# Patient Record
Sex: Female | Born: 1955
Health system: Southern US, Community
[De-identification: ages and names within clinical notes are randomized; demographics above are authoritative.]

## PROBLEM LIST (undated history)

## (undated) DIAGNOSIS — I10 Essential (primary) hypertension: Secondary | ICD-10-CM

## (undated) DIAGNOSIS — E7800 Pure hypercholesterolemia, unspecified: Secondary | ICD-10-CM

## (undated) DIAGNOSIS — Z9109 Other allergy status, other than to drugs and biological substances: Secondary | ICD-10-CM

## (undated) DIAGNOSIS — E119 Type 2 diabetes mellitus without complications: Secondary | ICD-10-CM

## (undated) DIAGNOSIS — R51 Headache: Secondary | ICD-10-CM

## (undated) DIAGNOSIS — E669 Obesity, unspecified: Secondary | ICD-10-CM

## (undated) DIAGNOSIS — M1990 Unspecified osteoarthritis, unspecified site: Secondary | ICD-10-CM

## (undated) DIAGNOSIS — H269 Unspecified cataract: Secondary | ICD-10-CM

## (undated) DIAGNOSIS — F419 Anxiety disorder, unspecified: Secondary | ICD-10-CM

## (undated) DIAGNOSIS — M5430 Sciatica, unspecified side: Secondary | ICD-10-CM

## (undated) DIAGNOSIS — R011 Cardiac murmur, unspecified: Secondary | ICD-10-CM

## (undated) DIAGNOSIS — J383 Other diseases of vocal cords: Secondary | ICD-10-CM

## (undated) DIAGNOSIS — K449 Diaphragmatic hernia without obstruction or gangrene: Secondary | ICD-10-CM

## (undated) DIAGNOSIS — R0600 Dyspnea, unspecified: Secondary | ICD-10-CM

## (undated) DIAGNOSIS — T7840XA Allergy, unspecified, initial encounter: Secondary | ICD-10-CM

## (undated) DIAGNOSIS — R209 Unspecified disturbances of skin sensation: Secondary | ICD-10-CM

## (undated) DIAGNOSIS — R0609 Other forms of dyspnea: Secondary | ICD-10-CM

## (undated) DIAGNOSIS — J42 Unspecified chronic bronchitis: Secondary | ICD-10-CM

## (undated) DIAGNOSIS — K3184 Gastroparesis: Secondary | ICD-10-CM

## (undated) DIAGNOSIS — J449 Chronic obstructive pulmonary disease, unspecified: Secondary | ICD-10-CM

## (undated) DIAGNOSIS — K219 Gastro-esophageal reflux disease without esophagitis: Secondary | ICD-10-CM

## (undated) DIAGNOSIS — G4730 Sleep apnea, unspecified: Secondary | ICD-10-CM

## (undated) DIAGNOSIS — Z5189 Encounter for other specified aftercare: Secondary | ICD-10-CM

## (undated) DIAGNOSIS — M75100 Unspecified rotator cuff tear or rupture of unspecified shoulder, not specified as traumatic: Secondary | ICD-10-CM

## (undated) HISTORY — DX: Gastroparesis: K31.84

## (undated) HISTORY — DX: Sleep apnea, unspecified: G47.30

## (undated) HISTORY — DX: Diaphragmatic hernia without obstruction or gangrene: K44.9

## (undated) HISTORY — DX: Sciatica, unspecified side: M54.30

## (undated) HISTORY — DX: Essential (primary) hypertension: I10

## (undated) HISTORY — PX: ABDOMINAL ADHESION SURGERY: SHX90

## (undated) HISTORY — DX: Unspecified cataract: H26.9

## (undated) HISTORY — DX: Encounter for other specified aftercare: Z51.89

## (undated) HISTORY — PX: CHOLECYSTECTOMY: SHX55

## (undated) HISTORY — DX: Obesity, unspecified: E66.9

## (undated) HISTORY — PX: TOE SURGERY: SHX1073

## (undated) HISTORY — DX: Allergy, unspecified, initial encounter: T78.40XA

## (undated) HISTORY — DX: Unspecified rotator cuff tear or rupture of unspecified shoulder, not specified as traumatic: M75.100

## (undated) HISTORY — PX: COLONOSCOPY: SHX174

---
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
## 1983-07-14 HISTORY — PX: TOTAL ABDOMINAL HYSTERECTOMY: SHX209

## 1990-07-13 HISTORY — PX: NASAL SINUS SURGERY: SHX719

## 2006-12-16 ENCOUNTER — Ambulatory Visit: Payer: Self-pay | Admitting: *Deleted

## 2006-12-16 ENCOUNTER — Ambulatory Visit: Payer: Self-pay | Admitting: Family Medicine

## 2006-12-30 ENCOUNTER — Ambulatory Visit: Payer: Self-pay | Admitting: Family Medicine

## 2007-03-03 ENCOUNTER — Ambulatory Visit: Payer: Self-pay | Admitting: Family Medicine

## 2007-03-03 LAB — CONVERTED CEMR LAB
Cholesterol: 206 mg/dL — ABNORMAL HIGH (ref 0–200)
HDL: 50 mg/dL (ref >39)
LDL Cholesterol: 127 mg/dL — ABNORMAL HIGH (ref 0–99)
Total CHOL/HDL Ratio: 4.1
Triglycerides: 147 mg/dL (ref <150)
VLDL: 29 mg/dL (ref 0–40)

## 2007-04-25 ENCOUNTER — Ambulatory Visit: Payer: Self-pay | Admitting: Family Medicine

## 2007-04-27 ENCOUNTER — Ambulatory Visit (HOSPITAL_COMMUNITY): Admission: RE | Admit: 2007-04-27 | Discharge: 2007-04-27 | Payer: Self-pay | Admitting: Family Medicine

## 2007-05-14 ENCOUNTER — Emergency Department (HOSPITAL_COMMUNITY): Admission: EM | Admit: 2007-05-14 | Discharge: 2007-05-14 | Payer: Self-pay | Admitting: Emergency Medicine

## 2007-05-26 DIAGNOSIS — J45909 Unspecified asthma, uncomplicated: Secondary | ICD-10-CM | POA: Insufficient documentation

## 2007-05-26 DIAGNOSIS — Z862 Personal history of diseases of the blood and blood-forming organs and certain disorders involving the immune mechanism: Secondary | ICD-10-CM | POA: Insufficient documentation

## 2007-05-26 DIAGNOSIS — F438 Other reactions to severe stress: Secondary | ICD-10-CM | POA: Insufficient documentation

## 2007-05-26 DIAGNOSIS — Z794 Long term (current) use of insulin: Secondary | ICD-10-CM

## 2007-05-26 DIAGNOSIS — K219 Gastro-esophageal reflux disease without esophagitis: Secondary | ICD-10-CM | POA: Insufficient documentation

## 2007-05-26 DIAGNOSIS — I10 Essential (primary) hypertension: Secondary | ICD-10-CM | POA: Insufficient documentation

## 2007-05-26 DIAGNOSIS — E119 Type 2 diabetes mellitus without complications: Secondary | ICD-10-CM | POA: Insufficient documentation

## 2007-05-26 DIAGNOSIS — F4389 Other reactions to severe stress: Secondary | ICD-10-CM | POA: Insufficient documentation

## 2007-05-30 ENCOUNTER — Ambulatory Visit: Payer: Self-pay | Admitting: Family Medicine

## 2007-06-27 ENCOUNTER — Ambulatory Visit: Payer: Self-pay | Admitting: Family Medicine

## 2007-12-15 ENCOUNTER — Encounter (INDEPENDENT_AMBULATORY_CARE_PROVIDER_SITE_OTHER): Payer: Self-pay | Admitting: Family Medicine

## 2007-12-15 ENCOUNTER — Ambulatory Visit: Payer: Self-pay | Admitting: Internal Medicine

## 2007-12-15 LAB — CONVERTED CEMR LAB
ALT: 13 units/L (ref 0–35)
AST: 13 units/L (ref 0–37)
Albumin: 4.3 g/dL (ref 3.5–5.2)
Alkaline Phosphatase: 63 units/L (ref 39–117)
BUN: 13 mg/dL (ref 6–23)
CO2: 27 meq/L (ref 19–32)
Calcium: 9.8 mg/dL (ref 8.4–10.5)
Chloride: 100 meq/L (ref 96–112)
Cholesterol: 225 mg/dL — ABNORMAL HIGH (ref 0–200)
Creatinine, Ser: 0.8 mg/dL (ref 0.40–1.20)
Glucose, Bld: 143 mg/dL — ABNORMAL HIGH (ref 70–99)
HDL: 42 mg/dL (ref >39)
LDL Cholesterol: 111 mg/dL — ABNORMAL HIGH (ref 0–99)
Microalb, Ur: 3.12 mg/dL — ABNORMAL HIGH (ref 0.00–1.89)
Potassium: 4.1 meq/L (ref 3.5–5.3)
Sodium: 139 meq/L (ref 135–145)
Total Bilirubin: 0.3 mg/dL (ref 0.3–1.2)
Total CHOL/HDL Ratio: 5.4
Total Protein: 7.5 g/dL (ref 6.0–8.3)
Triglycerides: 358 mg/dL — ABNORMAL HIGH (ref <150)
VLDL: 72 mg/dL — ABNORMAL HIGH (ref 0–40)
Vit D, 1,25-Dihydroxy: 14 — ABNORMAL LOW (ref 30–89)

## 2007-12-19 ENCOUNTER — Ambulatory Visit (HOSPITAL_COMMUNITY): Admission: RE | Admit: 2007-12-19 | Discharge: 2007-12-19 | Payer: Self-pay | Admitting: Family Medicine

## 2008-01-04 ENCOUNTER — Emergency Department (HOSPITAL_COMMUNITY): Admission: EM | Admit: 2008-01-04 | Discharge: 2008-01-04 | Payer: Self-pay | Admitting: Emergency Medicine

## 2008-02-11 ENCOUNTER — Ambulatory Visit (HOSPITAL_BASED_OUTPATIENT_CLINIC_OR_DEPARTMENT_OTHER): Admission: RE | Admit: 2008-02-11 | Discharge: 2008-02-11 | Payer: Self-pay | Admitting: Family Medicine

## 2008-02-14 ENCOUNTER — Ambulatory Visit: Payer: Self-pay | Admitting: Family Medicine

## 2008-02-14 LAB — CONVERTED CEMR LAB: Vit D, 1,25-Dihydroxy: 13 — ABNORMAL LOW (ref 30–89)

## 2008-02-18 ENCOUNTER — Ambulatory Visit: Payer: Self-pay | Admitting: Internal Medicine

## 2008-02-28 ENCOUNTER — Ambulatory Visit: Payer: Self-pay | Admitting: Internal Medicine

## 2008-03-21 ENCOUNTER — Ambulatory Visit: Payer: Self-pay | Admitting: Internal Medicine

## 2008-04-25 ENCOUNTER — Emergency Department (HOSPITAL_COMMUNITY): Admission: EM | Admit: 2008-04-25 | Discharge: 2008-04-25 | Payer: Self-pay | Admitting: Emergency Medicine

## 2008-05-10 ENCOUNTER — Ambulatory Visit: Payer: Self-pay | Admitting: Family Medicine

## 2008-09-04 ENCOUNTER — Ambulatory Visit: Payer: Self-pay | Admitting: Family Medicine

## 2008-09-12 ENCOUNTER — Ambulatory Visit (HOSPITAL_COMMUNITY): Admission: RE | Admit: 2008-09-12 | Discharge: 2008-09-12 | Payer: Self-pay | Admitting: Family Medicine

## 2008-09-27 ENCOUNTER — Encounter (INDEPENDENT_AMBULATORY_CARE_PROVIDER_SITE_OTHER): Payer: Self-pay | Admitting: Internal Medicine

## 2008-09-27 ENCOUNTER — Ambulatory Visit: Payer: Self-pay | Admitting: Cardiology

## 2008-09-27 ENCOUNTER — Inpatient Hospital Stay (HOSPITAL_COMMUNITY): Admission: EM | Admit: 2008-09-27 | Discharge: 2008-09-28 | Payer: Self-pay | Admitting: Emergency Medicine

## 2008-10-30 ENCOUNTER — Ambulatory Visit: Payer: Self-pay | Admitting: Family Medicine

## 2008-11-01 ENCOUNTER — Ambulatory Visit (HOSPITAL_COMMUNITY): Admission: RE | Admit: 2008-11-01 | Discharge: 2008-11-01 | Payer: Self-pay | Admitting: Family Medicine

## 2008-12-31 ENCOUNTER — Emergency Department (HOSPITAL_COMMUNITY): Admission: EM | Admit: 2008-12-31 | Discharge: 2008-12-31 | Payer: Self-pay | Admitting: Emergency Medicine

## 2008-12-31 ENCOUNTER — Ambulatory Visit: Payer: Self-pay | Admitting: Family Medicine

## 2009-02-14 ENCOUNTER — Ambulatory Visit: Payer: Self-pay | Admitting: Internal Medicine

## 2009-03-01 ENCOUNTER — Ambulatory Visit: Payer: Self-pay | Admitting: Family Medicine

## 2009-03-01 LAB — CONVERTED CEMR LAB
ALT: 18 units/L (ref 0–35)
AST: 14 units/L (ref 0–37)
Albumin: 4.2 g/dL (ref 3.5–5.2)
Alkaline Phosphatase: 58 units/L (ref 39–117)
BUN: 15 mg/dL (ref 6–23)
Basophils Absolute: 0 10*3/uL (ref 0.0–0.1)
Basophils Relative: 1 % (ref 0–1)
CO2: 26 meq/L (ref 19–32)
CRP: 1 mg/dL — ABNORMAL HIGH (ref <0.6)
Calcium: 9.3 mg/dL (ref 8.4–10.5)
Chloride: 102 meq/L (ref 96–112)
Creatinine, Ser: 0.73 mg/dL (ref 0.40–1.20)
Eosinophils Absolute: 0.2 10*3/uL (ref 0.0–0.7)
Eosinophils Relative: 4 % (ref 0–5)
Glucose, Bld: 164 mg/dL — ABNORMAL HIGH (ref 70–99)
HCT: 41.7 % (ref 36.0–46.0)
Hemoglobin: 13 g/dL (ref 12.0–15.0)
Lymphocytes Relative: 30 % (ref 12–46)
Lymphs Abs: 1.6 10*3/uL (ref 0.7–4.0)
MCHC: 31.2 g/dL (ref 30.0–36.0)
MCV: 86.2 fL (ref 78.0–100.0)
Monocytes Absolute: 0.4 10*3/uL (ref 0.1–1.0)
Monocytes Relative: 8 % (ref 3–12)
Neutro Abs: 3.2 10*3/uL (ref 1.7–7.7)
Neutrophils Relative %: 59 % (ref 43–77)
Platelets: 269 10*3/uL (ref 150–400)
Potassium: 4.3 meq/L (ref 3.5–5.3)
RBC: 4.84 M/uL (ref 3.87–5.11)
RDW: 16.9 % — ABNORMAL HIGH (ref 11.5–15.5)
Sodium: 140 meq/L (ref 135–145)
Total Bilirubin: 0.3 mg/dL (ref 0.3–1.2)
Total Protein: 7.3 g/dL (ref 6.0–8.3)
Vit D, 25-Hydroxy: 33 ng/mL (ref 30–89)
WBC: 5.6 10*3/uL (ref 4.0–10.5)

## 2009-04-16 ENCOUNTER — Ambulatory Visit: Payer: Self-pay | Admitting: Internal Medicine

## 2009-05-01 ENCOUNTER — Ambulatory Visit: Payer: Self-pay | Admitting: Family Medicine

## 2009-05-07 ENCOUNTER — Telehealth (INDEPENDENT_AMBULATORY_CARE_PROVIDER_SITE_OTHER): Payer: Self-pay | Admitting: *Deleted

## 2009-05-08 ENCOUNTER — Encounter (INDEPENDENT_AMBULATORY_CARE_PROVIDER_SITE_OTHER): Payer: Self-pay | Admitting: Family Medicine

## 2009-05-08 ENCOUNTER — Ambulatory Visit: Payer: Self-pay | Admitting: Adult Health

## 2009-05-08 LAB — CONVERTED CEMR LAB
ALT: 17 units/L (ref 0–35)
AST: 14 units/L (ref 0–37)
Albumin: 4.1 g/dL (ref 3.5–5.2)
Alkaline Phosphatase: 56 units/L (ref 39–117)
BUN: 13 mg/dL (ref 6–23)
CO2: 23 meq/L (ref 19–32)
Calcium: 9.3 mg/dL (ref 8.4–10.5)
Chloride: 103 meq/L (ref 96–112)
Cholesterol: 228 mg/dL — ABNORMAL HIGH (ref 0–200)
Creatinine, Ser: 0.78 mg/dL (ref 0.40–1.20)
Glucose, Bld: 127 mg/dL — ABNORMAL HIGH (ref 70–99)
HDL: 49 mg/dL (ref >39)
LDL Cholesterol: 154 mg/dL — ABNORMAL HIGH (ref 0–99)
Potassium: 4.6 meq/L (ref 3.5–5.3)
Sodium: 141 meq/L (ref 135–145)
Total Bilirubin: 0.3 mg/dL (ref 0.3–1.2)
Total CHOL/HDL Ratio: 4.7
Total Protein: 7.3 g/dL (ref 6.0–8.3)
Triglycerides: 125 mg/dL (ref <150)
VLDL: 25 mg/dL (ref 0–40)

## 2009-05-09 ENCOUNTER — Ambulatory Visit: Payer: Self-pay | Admitting: Internal Medicine

## 2009-05-17 ENCOUNTER — Emergency Department (HOSPITAL_COMMUNITY): Admission: EM | Admit: 2009-05-17 | Discharge: 2009-05-17 | Payer: Self-pay | Admitting: Emergency Medicine

## 2009-06-04 ENCOUNTER — Telehealth (INDEPENDENT_AMBULATORY_CARE_PROVIDER_SITE_OTHER): Payer: Self-pay | Admitting: *Deleted

## 2009-06-04 ENCOUNTER — Ambulatory Visit: Payer: Self-pay | Admitting: Internal Medicine

## 2009-06-04 ENCOUNTER — Ambulatory Visit: Admission: RE | Admit: 2009-06-04 | Discharge: 2009-06-04 | Payer: Self-pay | Admitting: Internal Medicine

## 2009-06-04 ENCOUNTER — Ambulatory Visit: Payer: Self-pay | Admitting: Vascular Surgery

## 2009-06-04 ENCOUNTER — Encounter: Payer: Self-pay | Admitting: Internal Medicine

## 2009-10-01 ENCOUNTER — Emergency Department (HOSPITAL_COMMUNITY): Admission: EM | Admit: 2009-10-01 | Discharge: 2009-10-02 | Payer: Self-pay | Admitting: Emergency Medicine

## 2009-10-24 ENCOUNTER — Ambulatory Visit: Payer: Self-pay | Admitting: Family Medicine

## 2009-11-18 ENCOUNTER — Ambulatory Visit: Payer: Self-pay | Admitting: Internal Medicine

## 2009-12-24 ENCOUNTER — Ambulatory Visit: Payer: Self-pay | Admitting: Family Medicine

## 2010-01-16 ENCOUNTER — Ambulatory Visit: Payer: Self-pay | Admitting: Internal Medicine

## 2010-03-25 ENCOUNTER — Ambulatory Visit: Payer: Self-pay | Admitting: Family Medicine

## 2010-03-25 LAB — CONVERTED CEMR LAB
BUN: 13 mg/dL (ref 6–23)
CO2: 27 meq/L (ref 19–32)
Calcium: 9.5 mg/dL (ref 8.4–10.5)
Chloride: 97 meq/L (ref 96–112)
Cholesterol: 189 mg/dL (ref 0–200)
Creatinine, Ser: 0.83 mg/dL (ref 0.40–1.20)
Glucose, Bld: 249 mg/dL — ABNORMAL HIGH (ref 70–99)
HDL: 50 mg/dL (ref >39)
Hgb A1c MFr Bld: 10.3 % — ABNORMAL HIGH (ref <5.7)
LDL Cholesterol: 111 mg/dL — ABNORMAL HIGH (ref 0–99)
Potassium: 4.3 meq/L (ref 3.5–5.3)
Sodium: 136 meq/L (ref 135–145)
Total CHOL/HDL Ratio: 3.8
Triglycerides: 138 mg/dL (ref <150)
VLDL: 28 mg/dL (ref 0–40)

## 2010-04-19 ENCOUNTER — Emergency Department (HOSPITAL_COMMUNITY): Admission: EM | Admit: 2010-04-19 | Discharge: 2010-04-19 | Payer: Self-pay | Admitting: Emergency Medicine

## 2010-08-13 NOTE — Progress Notes (Signed)
Summary: triage/leg swollen painful  Phone Note Call from Patient   Caller: Patient Reason for Call: Talk to Nurse Summary of Call: Patient states she is having right leg pain..states she has a hx of sciatica but it doesn't feel like that...behind her knee is swollen...can't see if it is red and doesn't feel hot...patient denies hx of blood clots however states she has circulation problems....she has a history of DM .Marland Kitchenput in Shaheerah's schedule... Initial call taken by: Verne Spurr,  June 04, 2009 11:07 AM

## 2010-08-13 NOTE — Progress Notes (Signed)
Summary: triage/congestion  Phone Note Call from Patient   Caller: Patient Reason for Call: Talk to Nurse Summary of Call: patient was seen 04/16/09 for asthma/chronic brondhitis....and feels she has gotten worse with sinus pressure and drainage. Patient states she has been having ear pain.Marland KitchenMarland KitchenShe has a history of DM, Asthma and chronic bronchitis.Marland KitchenMarland KitchenMarland KitchenPatient has cough and sounds congested... Initial call taken by: Verne Spurr,  May 07, 2009 11:49 AM

## 2010-08-26 ENCOUNTER — Ambulatory Visit (HOSPITAL_COMMUNITY)
Admission: RE | Admit: 2010-08-26 | Discharge: 2010-08-26 | Disposition: A | Discharge status: Home / Self Care | Payer: Self-pay | Source: Ambulatory Visit | Attending: Family Medicine | Admitting: Family Medicine

## 2010-08-26 ENCOUNTER — Other Ambulatory Visit: Payer: Self-pay | Admitting: Family Medicine

## 2010-08-26 DIAGNOSIS — J42 Unspecified chronic bronchitis: Secondary | ICD-10-CM | POA: Insufficient documentation

## 2010-08-26 DIAGNOSIS — R0602 Shortness of breath: Secondary | ICD-10-CM | POA: Insufficient documentation

## 2010-09-19 ENCOUNTER — Emergency Department (HOSPITAL_COMMUNITY): Payer: Self-pay

## 2010-09-19 ENCOUNTER — Emergency Department (HOSPITAL_COMMUNITY)
Admission: EM | Admit: 2010-09-19 | Discharge: 2010-09-19 | Disposition: A | Discharge status: Home / Self Care | Payer: Self-pay | Attending: Emergency Medicine | Admitting: Emergency Medicine

## 2010-09-19 DIAGNOSIS — R05 Cough: Secondary | ICD-10-CM | POA: Insufficient documentation

## 2010-09-19 DIAGNOSIS — R079 Chest pain, unspecified: Secondary | ICD-10-CM | POA: Insufficient documentation

## 2010-09-19 DIAGNOSIS — Z79899 Other long term (current) drug therapy: Secondary | ICD-10-CM | POA: Insufficient documentation

## 2010-09-19 DIAGNOSIS — Z7982 Long term (current) use of aspirin: Secondary | ICD-10-CM | POA: Insufficient documentation

## 2010-09-19 DIAGNOSIS — I10 Essential (primary) hypertension: Secondary | ICD-10-CM | POA: Insufficient documentation

## 2010-09-19 DIAGNOSIS — J45909 Unspecified asthma, uncomplicated: Secondary | ICD-10-CM | POA: Insufficient documentation

## 2010-09-19 DIAGNOSIS — E119 Type 2 diabetes mellitus without complications: Secondary | ICD-10-CM | POA: Insufficient documentation

## 2010-09-19 DIAGNOSIS — R0602 Shortness of breath: Secondary | ICD-10-CM | POA: Insufficient documentation

## 2010-09-19 DIAGNOSIS — R059 Cough, unspecified: Secondary | ICD-10-CM | POA: Insufficient documentation

## 2010-09-25 LAB — CBC
HCT: 41.6 % (ref 36.0–46.0)
Hemoglobin: 13.6 g/dL (ref 12.0–15.0)
MCH: 27.3 pg (ref 26.0–34.0)
MCHC: 32.7 g/dL (ref 30.0–36.0)
MCV: 83.4 fL (ref 78.0–100.0)
Platelets: 212 10*3/uL (ref 150–400)
RBC: 4.99 MIL/uL (ref 3.87–5.11)
RDW: 14.1 % (ref 11.5–15.5)
WBC: 7.4 10*3/uL (ref 4.0–10.5)

## 2010-09-25 LAB — DIFFERENTIAL
Basophils Absolute: 0 10*3/uL (ref 0.0–0.1)
Basophils Relative: 0 % (ref 0–1)
Eosinophils Absolute: 0.3 10*3/uL (ref 0.0–0.7)
Eosinophils Relative: 4 % (ref 0–5)
Lymphocytes Relative: 35 % (ref 12–46)
Lymphs Abs: 2.6 10*3/uL (ref 0.7–4.0)
Monocytes Absolute: 0.5 10*3/uL (ref 0.1–1.0)
Monocytes Relative: 7 % (ref 3–12)
Neutro Abs: 4 10*3/uL (ref 1.7–7.7)
Neutrophils Relative %: 54 % (ref 43–77)

## 2010-09-25 LAB — BASIC METABOLIC PANEL
BUN: 8 mg/dL (ref 6–23)
CO2: 26 mEq/L (ref 19–32)
Calcium: 9.2 mg/dL (ref 8.4–10.5)
Chloride: 104 mEq/L (ref 96–112)
Creatinine, Ser: 0.76 mg/dL (ref 0.4–1.2)
GFR calc Af Amer: >60 mL/min (ref >60)
GFR calc non Af Amer: >60 mL/min (ref >60)
Glucose, Bld: 208 mg/dL — ABNORMAL HIGH (ref 70–99)
Potassium: 3.4 mEq/L — ABNORMAL LOW (ref 3.5–5.1)
Sodium: 137 mEq/L (ref 135–145)

## 2010-10-05 LAB — RAPID URINE DRUG SCREEN, HOSP PERFORMED
Amphetamines: NOT DETECTED
Barbiturates: NOT DETECTED
Benzodiazepines: NOT DETECTED
Cocaine: NOT DETECTED
Opiates: NOT DETECTED
Tetrahydrocannabinol: NOT DETECTED

## 2010-10-05 LAB — DIFFERENTIAL
Basophils Absolute: 0 10*3/uL (ref 0.0–0.1)
Basophils Relative: 1 % (ref 0–1)
Eosinophils Absolute: 0.2 10*3/uL (ref 0.0–0.7)
Eosinophils Relative: 4 % (ref 0–5)
Lymphocytes Relative: 34 % (ref 12–46)
Lymphs Abs: 2.2 10*3/uL (ref 0.7–4.0)
Monocytes Absolute: 0.4 10*3/uL (ref 0.1–1.0)
Monocytes Relative: 6 % (ref 3–12)
Neutro Abs: 3.6 10*3/uL (ref 1.7–7.7)
Neutrophils Relative %: 56 % (ref 43–77)

## 2010-10-05 LAB — CBC
HCT: 40.8 % (ref 36.0–46.0)
Hemoglobin: 13.4 g/dL (ref 12.0–15.0)
MCHC: 32.8 g/dL (ref 30.0–36.0)
MCV: 83.9 fL (ref 78.0–100.0)
Platelets: 227 10*3/uL (ref 150–400)
RBC: 4.86 MIL/uL (ref 3.87–5.11)
RDW: 15.1 % (ref 11.5–15.5)
WBC: 6.4 10*3/uL (ref 4.0–10.5)

## 2010-10-05 LAB — URINE MICROSCOPIC-ADD ON

## 2010-10-05 LAB — URINALYSIS, ROUTINE W REFLEX MICROSCOPIC
Bilirubin Urine: NEGATIVE
Glucose, UA: NEGATIVE mg/dL
Hgb urine dipstick: NEGATIVE
Ketones, ur: 15 mg/dL — AB
Leukocytes, UA: NEGATIVE
Nitrite: NEGATIVE
Protein, ur: 100 mg/dL — AB
Specific Gravity, Urine: 1.03 (ref 1.005–1.030)
Urobilinogen, UA: 1 mg/dL (ref 0.0–1.0)
pH: 5.5 (ref 5.0–8.0)

## 2010-10-05 LAB — COMPREHENSIVE METABOLIC PANEL
ALT: 27 U/L (ref 0–35)
AST: 26 U/L (ref 0–37)
Albumin: 3.9 g/dL (ref 3.5–5.2)
Alkaline Phosphatase: 74 U/L (ref 39–117)
BUN: 8 mg/dL (ref 6–23)
CO2: 28 mEq/L (ref 19–32)
Calcium: 10.1 mg/dL (ref 8.4–10.5)
Chloride: 105 mEq/L (ref 96–112)
Creatinine, Ser: 0.75 mg/dL (ref 0.4–1.2)
GFR calc Af Amer: >60 mL/min (ref >60)
GFR calc non Af Amer: >60 mL/min (ref >60)
Glucose, Bld: 130 mg/dL — ABNORMAL HIGH (ref 70–99)
Potassium: 4.1 mEq/L (ref 3.5–5.1)
Sodium: 140 mEq/L (ref 135–145)
Total Bilirubin: 0.4 mg/dL (ref 0.3–1.2)
Total Protein: 7.4 g/dL (ref 6.0–8.3)

## 2010-10-05 LAB — ETHANOL: Alcohol, Ethyl (B): <5 mg/dL (ref 0–10)

## 2010-10-05 LAB — D-DIMER, QUANTITATIVE: D-Dimer, Quant: 0.26 ug/mL-FEU (ref 0.00–0.48)

## 2010-10-15 LAB — POCT I-STAT, CHEM 8
BUN: 17 mg/dL (ref 6–23)
Calcium, Ion: 1.16 mmol/L (ref 1.12–1.32)
Chloride: 102 mEq/L (ref 96–112)
Creatinine, Ser: 0.6 mg/dL (ref 0.4–1.2)
Glucose, Bld: 147 mg/dL — ABNORMAL HIGH (ref 70–99)
HCT: 41 % (ref 36.0–46.0)
Hemoglobin: 13.9 g/dL (ref 12.0–15.0)
Potassium: 3.9 mEq/L (ref 3.5–5.1)
Sodium: 140 mEq/L (ref 135–145)
TCO2: 28 mmol/L (ref 0–100)

## 2010-10-15 LAB — CBC
HCT: 37.6 % (ref 36.0–46.0)
Hemoglobin: 12.6 g/dL (ref 12.0–15.0)
MCHC: 33.6 g/dL (ref 30.0–36.0)
MCV: 84.2 fL (ref 78.0–100.0)
Platelets: 227 10*3/uL (ref 150–400)
RBC: 4.46 MIL/uL (ref 3.87–5.11)
RDW: 14.9 % (ref 11.5–15.5)
WBC: 6.4 10*3/uL (ref 4.0–10.5)

## 2010-10-15 LAB — DIFFERENTIAL
Basophils Absolute: 0 10*3/uL (ref 0.0–0.1)
Basophils Relative: 1 % (ref 0–1)
Eosinophils Absolute: 0.2 10*3/uL (ref 0.0–0.7)
Eosinophils Relative: 4 % (ref 0–5)
Lymphocytes Relative: 29 % (ref 12–46)
Lymphs Abs: 1.9 10*3/uL (ref 0.7–4.0)
Monocytes Absolute: 0.3 10*3/uL (ref 0.1–1.0)
Monocytes Relative: 5 % (ref 3–12)
Neutro Abs: 3.9 10*3/uL (ref 1.7–7.7)
Neutrophils Relative %: 61 % (ref 43–77)

## 2010-10-15 LAB — POCT CARDIAC MARKERS
CKMB, poc: 1.3 ng/mL (ref 1.0–8.0)
Myoglobin, poc: 62.9 ng/mL (ref 12–200)
Troponin i, poc: <0.05 ng/mL (ref 0.00–0.09)

## 2010-10-23 LAB — COMPREHENSIVE METABOLIC PANEL
ALT: 20 U/L (ref 0–35)
AST: 13 U/L (ref 0–37)
Albumin: 3.9 g/dL (ref 3.5–5.2)
Alkaline Phosphatase: 60 U/L (ref 39–117)
BUN: 8 mg/dL (ref 6–23)
CO2: 30 mEq/L (ref 19–32)
Calcium: 9.2 mg/dL (ref 8.4–10.5)
Chloride: 100 mEq/L (ref 96–112)
Creatinine, Ser: 0.7 mg/dL (ref 0.4–1.2)
GFR calc Af Amer: >60 mL/min (ref >60)
GFR calc non Af Amer: >60 mL/min (ref >60)
Glucose, Bld: 181 mg/dL — ABNORMAL HIGH (ref 70–99)
Potassium: 3.5 mEq/L (ref 3.5–5.1)
Sodium: 138 mEq/L (ref 135–145)
Total Bilirubin: 0.7 mg/dL (ref 0.3–1.2)
Total Protein: 7.2 g/dL (ref 6.0–8.3)

## 2010-10-23 LAB — LIPID PANEL
Cholesterol: 251 mg/dL — ABNORMAL HIGH (ref 0–200)
Cholesterol: 286 mg/dL — ABNORMAL HIGH (ref 0–200)
HDL: 32 mg/dL — ABNORMAL LOW (ref >39)
HDL: 37 mg/dL — ABNORMAL LOW (ref >39)
LDL Cholesterol: 177 mg/dL — ABNORMAL HIGH (ref 0–99)
LDL Cholesterol: 210 mg/dL — ABNORMAL HIGH (ref 0–99)
Total CHOL/HDL Ratio: 7.7 RATIO
Total CHOL/HDL Ratio: 7.8 RATIO
Triglycerides: 196 mg/dL — ABNORMAL HIGH (ref <150)
Triglycerides: 209 mg/dL — ABNORMAL HIGH (ref <150)
VLDL: 39 mg/dL (ref 0–40)
VLDL: 42 mg/dL — ABNORMAL HIGH (ref 0–40)

## 2010-10-23 LAB — GLUCOSE, CAPILLARY
Glucose-Capillary: 181 mg/dL — ABNORMAL HIGH (ref 70–99)
Glucose-Capillary: 197 mg/dL — ABNORMAL HIGH (ref 70–99)
Glucose-Capillary: 213 mg/dL — ABNORMAL HIGH (ref 70–99)
Glucose-Capillary: 214 mg/dL — ABNORMAL HIGH (ref 70–99)
Glucose-Capillary: 258 mg/dL — ABNORMAL HIGH (ref 70–99)
Glucose-Capillary: 292 mg/dL — ABNORMAL HIGH (ref 70–99)

## 2010-10-23 LAB — CK TOTAL AND CKMB (NOT AT ARMC)
CK, MB: 1.2 ng/mL (ref 0.3–4.0)
Relative Index: INVALID (ref 0.0–2.5)
Total CK: 95 U/L (ref 7–177)

## 2010-10-23 LAB — CARDIAC PANEL(CRET KIN+CKTOT+MB+TROPI)
CK, MB: 1.3 ng/mL (ref 0.3–4.0)
CK, MB: 1.4 ng/mL (ref 0.3–4.0)
Relative Index: 1.2 (ref 0.0–2.5)
Relative Index: 1.2 (ref 0.0–2.5)
Total CK: 113 U/L (ref 7–177)
Total CK: 120 U/L (ref 7–177)
Troponin I: <0.01 ng/mL (ref 0.00–0.06)
Troponin I: <0.01 ng/mL (ref 0.00–0.06)

## 2010-10-23 LAB — D-DIMER, QUANTITATIVE: D-Dimer, Quant: 0.39 ug/mL-FEU (ref 0.00–0.48)

## 2010-10-23 LAB — CBC
HCT: 40.2 % (ref 36.0–46.0)
Hemoglobin: 13.1 g/dL (ref 12.0–15.0)
MCHC: 32.7 g/dL (ref 30.0–36.0)
MCV: 83.6 fL (ref 78.0–100.0)
Platelets: 242 10*3/uL (ref 150–400)
RBC: 4.81 MIL/uL (ref 3.87–5.11)
RDW: 15.6 % — ABNORMAL HIGH (ref 11.5–15.5)
WBC: 6.3 10*3/uL (ref 4.0–10.5)

## 2010-10-23 LAB — HEMOGLOBIN A1C
Hgb A1c MFr Bld: 7.2 % — ABNORMAL HIGH (ref 4.6–6.1)
Hgb A1c MFr Bld: 7.3 % — ABNORMAL HIGH (ref 4.6–6.1)
Mean Plasma Glucose: 160 mg/dL
Mean Plasma Glucose: 163 mg/dL

## 2010-10-23 LAB — DIFFERENTIAL
Basophils Absolute: 0.1 10*3/uL (ref 0.0–0.1)
Basophils Relative: 1 % (ref 0–1)
Eosinophils Absolute: 0.2 10*3/uL (ref 0.0–0.7)
Eosinophils Relative: 4 % (ref 0–5)
Lymphocytes Relative: 36 % (ref 12–46)
Lymphs Abs: 2.2 10*3/uL (ref 0.7–4.0)
Monocytes Absolute: 0.5 10*3/uL (ref 0.1–1.0)
Monocytes Relative: 8 % (ref 3–12)
Neutro Abs: 3.3 10*3/uL (ref 1.7–7.7)
Neutrophils Relative %: 52 % (ref 43–77)

## 2010-10-23 LAB — HOMOCYSTEINE
Homocysteine: 8.3 umol/L (ref 4.0–15.4)
Homocysteine: 9.3 umol/L (ref 4.0–15.4)

## 2010-10-23 LAB — POCT CARDIAC MARKERS
CKMB, poc: 1.4 ng/mL (ref 1.0–8.0)
Myoglobin, poc: 62.8 ng/mL (ref 12–200)
Troponin i, poc: <0.05 ng/mL (ref 0.00–0.09)

## 2010-10-23 LAB — TSH
TSH: 2.782 u[IU]/mL (ref 0.350–4.500)
TSH: 3.476 u[IU]/mL (ref 0.350–4.500)

## 2010-10-23 LAB — TROPONIN I: Troponin I: <0.01 ng/mL (ref 0.00–0.06)

## 2010-10-23 LAB — BRAIN NATRIURETIC PEPTIDE: Pro B Natriuretic peptide (BNP): <30 pg/mL (ref 0.0–100.0)

## 2010-11-25 NOTE — H&P (Signed)
[name], ORTLIP NO.:  [id number]   MEDICAL RECORD NO.:  [id number]          PATIENT TYPE:  INP   LOCATION:  2928                         FACILITY:  [facility]   PHYSICIAN:  Karlyn Agee, M.D. DATE OF BIRTH:  05-Sep-1955   DATE OF ADMISSION:  09/26/2008  DATE OF DISCHARGE:                              HISTORY & PHYSICAL   PRIMARY CARE PHYSICIAN:  [facility].   CHIEF COMPLAINT:  Progressively worsening chest pain.   HISTORY OF PRESENT ILLNESS:  This is a 55 year old morbidly obese  African American lady with a history of obstructive sleep apnea,  hypertension and diabetes who has been having intermittent chest pains  for the past 3 months.  The pain is aggravated by exertion, but present  even at rest associated with nausea, and radiating down into her left  arm.  The patient has been evaluated by [facility] and been diagnosed  with obstructive sleep apnea.  Since she has been using a CPAP machine  at bedtime she has noted that there is improvement in the chest pain  while she is wearing the CPAP.  However, during the day she is troubled  by chest pain.  Since it is getting progressively worse she eventually  sought help in the emergency room since she can only be seen at  [facility] on a three monthly basis.  The patient has a mother who is  status post CABG.  The patient is also obese and has hyperlipidemia.  She does not use tobacco.  She is fairly sedentary.   PAST MEDICAL HISTORY:  Hypertension, diabetes, obstructive sleep apnea,  morbid obesity, bronchitis.   MEDICATIONS:  1. Lisinopril 10 mg daily.  2. Lipitor 10 mg daily.  3. Fish Oil 3 one tablet 3 times daily.  4. Vitamin D 2000 units daily.  5. Levemir 42 units twice daily.  6. Advair Diskus twice daily.  7. Avandamet.  8. Has run out of these medications.  Protonix 40 mg daily.  9. Albuterol p.r.n.  10.Aspirin 162 mg daily.   ALLERGIES:  No known drug allergies.   SOCIAL HISTORY:   She denies tobacco, alcohol or illicit drug use.  She  is an unemployed [employer].   FAMILY HISTORY:  Significant for coronary artery disease as noted above.  Also hypertension, diabetes and congestive heart failure.   REVIEW OF SYSTEMS:  Other than noted above significant for episodic  headaches, chronic cough related to bronchitis, episodic constipation  and ankle pain.  The patient does have paroxysmal nocturnal dyspnea,  dyspnea on exertion and lower extremity edema worse during the day.   PHYSICAL EXAMINATION:  GENERAL:  A morbidly obese African American lady  sitting up on the stretcher, in no acute distress at this time.  VITAL SIGNS:  Temperature 99.2, pulse 73, respirations 16, blood  pressure 134/82.  She is saturating at 99% on 2 liters.  HEENT:  Her pupils are round and equal.  Mucous membranes pink.  Anicteric.  No cervical lymphadenopathy or thyromegaly.  No jugular  venous distention but she does have a very thick neck.  CHEST:  Heart sounds are difficult to hear but no murmurs are heard.  ABDOMEN:  Obese, soft, nontender.  EXTREMITIES:  Without edema.  She has 2+ bounding dorsalis pedis pulses  bilaterally.  CENTRAL NERVOUS SYSTEM:  Cranial nerves II-XII are grossly intact.  She  has no focal lateralizing signs.   LABS:  Her CBC is reviewed and is unremarkable.  Her serum chemistry is  reviewed and is remarkable only for a potassium of 3.5 and a serum  glucose of 181.  Otherwise everything is within normal limits.  Her  cardiac enzymes are completely normal with an undetectable troponin.  CK-  MB of 1.4 and myoglobin of 62.8.  Her beta-natriuretic peptide is  undetectable.  Chest x-ray is reported as showing mild cardiac  enlargement and vascular congestion, bibasilar atelectasis, no edema,  infiltrates or effusions.  EKG shows sinus rhythm with right atrial  enlargement and a prolonged QT interval.  There are no ST segment  changes otherwise.   ASSESSMENT:   1. Atypical chest pain.  2. Morbid obesity.  3. Hypertension controlled.  4. Diabetes type 2.  5. History of bronchitis.   PLAN:  1. Will admit this lady for cardiac workup on the basis of family      history and symptoms although in all likelihood symptoms are      related to obstructive sleep apnea and possible subsequent      pulmonary hypertension.  2. Will get 2-D echo.  3. Will also check a D-dimer and if elevated may be candidate for CT      angiogram of the chest to rule out pulmonary emboli.  4. Will counsel on diet and the importance of weight loss.  5. The patient because of her lack of insurance may be counseled to      transition to NPH insulin for blood glucose control rather than      Levemir.  Will have her on Lantus while she is in the hospital.  6. Other plans as per orders.      Karlyn Agee, M.D.  Electronically Signed     LC/MEDQ  D:  09/27/2008  T:  09/27/2008  Job:  [id number]   cc:   Myriam Jacobson

## 2010-11-25 NOTE — Consult Note (Signed)
[name], [name]                ACCOUNT NO.:  [id number]   MEDICAL RECORD NO.:  [id number]          PATIENT TYPE:  INP   LOCATION:  4738                         FACILITY:  [facility]   PHYSICIAN:  Madolyn Frieze. Jens Som, MD, FACCDATE OF BIRTH:  May 14, 1956   DATE OF CONSULTATION:  09/27/2008  DATE OF DISCHARGE:                                 CONSULTATION   Ms. [name] is a 55 year old female with past medical history of diabetes,  hypertension, hyperlipidemia, COPD/asthma, and obstructive sleep apnea,  who I am asked to evaluate for chest pain.  Note, the patient has  chronic dyspnea on exertion, but there is no orthopnea or PND.  She does  use CPAP for obstructive sleep apnea.  She also occasionally has mild  pedal edema.  She does not have exertional chest pain.  Over the past 3  months, she has noticed increased congestion.  She has also had a  nonproductive cough.  There has been no fevers or chills or hemoptysis.  During that time, she feels pain across her chest.  The pain does not  radiate.  She states that it always occurs when she is congested.  She  has also had a needle stick sensation in the left chest area.  This is  not exertional nor is it pleuritic or positional.  It is not related to  food.  There is no associated nausea, vomiting, shortness of breath, or  diaphoresis.  It last for seconds and resolved spontaneously.  Because  of the above, she was admitted for bronchitis and also for chest pain.  She is ruled out myocardial infarction, and Cardiology is asked to  further evaluate.   PAST MEDICAL HISTORY:  Significant for diabetes mellitus for 7 years.  She also has hypertension and hyperlipidemia.  She has a long history of  COPD/asthma/obstructive sleep apnea and she does use CPAP.  She has a  history of gastroesophageal reflux disease and peptic ulcer disease by  her report.  She has had a prior sinus surgery as well as a partial  hysterectomy followed by a total  hysterectomy.  There is no other past  medical history noted.   ALLERGIES:  She has no known drug allergies.   SOCIAL HISTORY:  She has remote history of tobacco use, but has not  smoked in 15-20 years.  She has remote history of alcohol use, but has  not drunk in 15 years.  She is married, with 2 children.  She is  unemployed.   FAMILY HISTORY:  Significant for her mother who had coronary bypassing  graft, but it was at age 11.   REVIEW OF SYSTEMS:  She denies any headaches or fevers or chills.  She  has had a nonproductive cough.  There is no hemoptysis.  There is no  dysphagia or odynophagia nor is there any melena.  She does occasionally  have hematochezia, but attributes this to her hemorrhoids.  There is no  dysuria or hematuria.  There is no rash or seizure activity.  There is  no orthopnea or PND, but there is pedal edema.  Remaining systems are  negative.   PHYSICAL EXAMINATION:  VITAL SIGNS:  Blood pressure of 128/77 and pulse  of 71.  She is afebrile at 98.1.  She is 100% on 2 L.  GENERAL:  She is well developed and morbidly obese.  She is no acute  distress at present.  SKIN:  Warm and dry.  She does not appear to be depressed.  There is no  peripheral clubbing.  BACK:  Normal.  HEENT:  Normal with normal eyelids.  NECK:  Supple with a normal upstroke bilaterally.  No bruits noted.  There is no jugular distention.  I cannot appreciate thyromegaly.  CHEST:  Mild expiratory wheeze.  There is no rhonchi noted.  CARDIOVASCULAR:  Regular rate and rhythm.  Normal S1 and S2.  There is a  1/6 systolic ejection murmur at the left sternal border.  There is no  diastolic murmur noted.  ABDOMEN:  Nontender and nondistended.  Positive bowel sounds.  No  hepatosplenomegaly.  No mass appreciated.  There is no abdominal bruit.  She has 2+ femoral pulses bilaterally.  No bruits.  EXTREMITIES:  Trace edema bilaterally.  There are no cords palpated.  She has 2+ dorsalis pedis pulses  bilaterally.  NEUROLOGIC:  Grossly intact.   Her electrocardiogram shows a sinus rhythm at a rate of 69.  There is  right axis deviation.  There are nonspecific ST changes.  Her enzymes  are negative x3.  Her D-dimer is normal.  A BNP was normal at less than  30.  Her sodium is 138 with potassium of 3.5.  BUN and creatinine are 8  and 0.70.  Her white blood cell count of 6.3 with hemoglobin of 13.1,  hematocrit of 40.2, and her platelet count is 242.   DIAGNOSES:  1. Chest pain - symptoms.  The patient's symptoms are extremely      atypical.  However, she does have diabetes, hypertension,      hyperlipidemia, and prior tobacco abuse.  We will risk stratify      with a dobutamine Myoview.  If it is normal, we will not pursue      further cardiac workup.  2. Chronic obstructive pulmonary disease/asthma/obstructive sleep      apnea - this appears to be the predominant problem.  I would      recommend discontinuing the patient's Lopressor as it may      exacerbate her asthma.  She will continue on her CPAP.  She needs a      pulmonary toilet and management.  Now, we will leave this to the      primary care service.  3. Diabetes mellitus - management per primary care.  4. Hypertension - blood pressure is controlled on her present      medications.  5. Hyperlipidemia - she would benefit from a statin long term given      her elevated LDL and diabetes mellitus.  I will leave this to      primary care.  She would also benefit from an ACE inhibitor long      term given her diabetes mellitus.  6. Obesity - the patient is morbidly obese and needs to lose weight,      and we discussed this.      Madolyn Frieze Jens Som, MD, [facility]  Electronically Signed     BSC/MEDQ  D:  09/27/2008  T:  09/27/2008  Job:  [id number]

## 2010-11-25 NOTE — Procedures (Signed)
NAMECLEMMA, [name]                ACCOUNT NO.:  [id number]   MEDICAL RECORD NO.:  [id number]          PATIENT TYPE:  OUT   LOCATION:  SLEEP CENTER                 [facility]ACILITY[facility]  [facility]   PHYSICIAN:  Clinton D. Maple Hudson, MD, FCCP, FACPDATE OF BIRTH:  January 27, 1956   DATE OF STUDY:  02/11/2008                            NOCTURNAL POLYSOMNOGRAM   REFERRING PHYSICIAN:  Maurice March, M.D.   REFERRING PHYSICIAN:  Maurice March, M.D.   INDICATION FOR STUDY:  Hypersomnia with sleep apnea.   EPWORTH SLEEPINESS SCORE:  8/24.  BMI 39.6.  Weight 253 pounds.  Height  67 inches.  Neck 16 inches.   HOME MEDICATIONS:  Charted and reviewed.   SLEEP ARCHITECTURE:  Split study protocol.  During the diagnostic phase  total sleep time was 127 minutes with sleep efficiency 54%.  Stage 1 was  7.1%.  Stage 2 83.9%.  Stage 3 absent.  REM 9.1% of total sleep time.  Sleep latency 71 minutes.  REM latency 22 minutes.  Awake after sleep  onset 26.5.  Arousal index 0.9.  No bedtime medication was taken.   RESPIRATORY DATA:  Split study protocol.  Apnea hypopnea index (AHI)  65.2 per hour; 138 events were scored, all hypopneas.  Events were not  positional.  REM AHI 120.  CPAP was titrated to 11 CWP, AHI 0 per hour.  She chose a medium Quattro full face mask with heated humidifier.   OXYGEN DATA:  Loud snoring before CPAP with oxygen desaturation to a  nadir of 68%.  After CPAP control mean oxygen saturation held 95.1% on  room air.   CARDIAC DATA:  Normal sinus rhythm.   MOVEMENT-PARASOMNIA:  No significant movement disturbance.  Bathroom x1.   IMPRESSIONS-RECOMMENDATIONS:  1. The patient indicated she had bronchitis with some wheeze and cough      but wished to proceed with the study.  She told technician that      typically at home she will awaken during the night and watch TV      until sleepy again.  She indicated upon waking at 4:30 that she had      already slept too long and that she was  usually up and awake by      that time.  This suggests discussion of sleep habits and sleep      hygiene.  2. Severe obstructive sleep apnea/hypopnea syndrome (AHI 65.2 per      hour) with nonpositional events, loud snoring and oxygen      desaturation to a nadir of 68%.  3. Successful CPAP titration to 11 CWP, AHI 0 per hour.  She chose a      medium Mirage Quattro full face mask with heated humidifier.      Clinton D. Maple Hudson, MD, [facility], FACP  Diplomate, Biomedical engineer of Sleep Medicine  Electronically Signed     CDY/MEDQ  D:  02/18/2008 09:36:08  T:  02/18/2008 09:51:46  Job:  [id number]

## 2010-11-25 NOTE — Discharge Summary (Signed)
[name], [name]                ACCOUNT NO.:  [id number]   MEDICAL RECORD NO.:  [id number]          PATIENT TYPE:  INP   LOCATION:  4738                         FACILITY:  [facility]   PHYSICIAN:  Michelene Gardener, MD    DATE OF BIRTH:  [birth year]   DATE OF ADMISSION:  09/26/2008  DATE OF DISCHARGE:  09/28/2008                               DISCHARGE SUMMARY   DISCHARGE DIAGNOSES:  1. Atypical chest pain, most likely related to bronchitis.  2. Morbid obesity.  3. Obstructive sleep apnea and the patient is on continuous positive      airway pressure machine.  4. Hypertension.  5. Type 2 diabetes mellitus.  6. History of asthma.   DISCHARGE MEDICATIONS:  1. Lisinopril 10 mg p.o. once daily.  2. Lipitor 10 mg once a day.  3. Omega 3 one tablet 3 times daily.  4. Vitamin D 2000 units once a day.  5. Levemir 42 units twice daily.  6. Advair Diskus 1 puff twice daily.  7. Avandamet, the patient does not know the dose and she was advised      to take same dose taken at home.  8. Protonix 40 mg once a day.  9. Albuterol inhaler 2 puffs q.4 h. as needed.  10.Aspirin 160 mg once a day.  11.Zithromax 500 mg once a day for 5 days.   CONSULTATIONS:  Cardiology consult.   PROCEDURES:  Myoview stress test, which is negative for ischemia.   RADIOLOGY STUDIES:  Chest x-ray on September 27, 2008, showed no infiltrate  or effusions.   COURSE OF HOSPITALIZATION:  1. Atypical chest pain.  This patient has multiple risk factors      including diabetes, hypertension, hyperlipidemia, and obesity.  The      patient was admitted to the floor for further evaluation.  Her      telemetry showed 2-second pause, which is attributed to her      obstructive sleep apnea as per Cardiology.  The patient was      evaluated by Cardiology in the hospital.  Echocardiogram was done      and it showed ejection fraction of 55-60% with no evidence of      regional wall motion abnormalities.  The patient was taken for      Myoview stress test on September 28, 2008, and that came to be      negative.  At the time of discharge, the patient was back to her      baseline.  She does not have any chest pain.  There is no shortness      of breath.  She will be discharged on aspirin in addition to risk      factor treatment.  2. Diabetes mellitus.  The patient was continued on her Levemir and      Avandamet.  Hemoglobin A1c was done and it was 7.2.  I spoke to her      regarding better control of her diabetes.  3. Hypertension.  Blood pressure remained stable in the hospital, and      the patient  was continuing the same medications taken as an      outpatient.  4. Hyperlipidemia.  The patient was continued on her lipid.  5. Obstructive sleep apnea.  The patient was advised to continue her      CPAP machine during the nighttime.  6. Arrhythmia.  This patient had pause in her telemetry, which is      around 2 seconds and that was attributed to her underlying      obstructive sleep apnea.  The patient is currently stable at the      time of discharge.  Her chest pain is atypical one and it is most      likely secondary to bronchitis.  The patient will be treated      symptomatically with inhalers in addition to Zithromax for 5 days.   TOTAL ASSESSMENT TIME:  40 minutes.      Michelene Gardener, MD  Electronically Signed     NAE/MEDQ  D:  09/28/2008  T:  09/29/2008  Job:  [id number]

## 2010-12-16 ENCOUNTER — Other Ambulatory Visit: Payer: Self-pay | Admitting: Internal Medicine

## 2010-12-16 DIAGNOSIS — Z1231 Encounter for screening mammogram for malignant neoplasm of breast: Secondary | ICD-10-CM

## 2010-12-23 ENCOUNTER — Ambulatory Visit
Admission: RE | Admit: 2010-12-23 | Discharge: 2010-12-23 | Disposition: A | Discharge status: Home / Self Care | Payer: Medicare Other | Source: Ambulatory Visit | Attending: Internal Medicine | Admitting: Internal Medicine

## 2010-12-23 DIAGNOSIS — Z1231 Encounter for screening mammogram for malignant neoplasm of breast: Secondary | ICD-10-CM

## 2010-12-24 ENCOUNTER — Other Ambulatory Visit: Payer: Self-pay | Admitting: Internal Medicine

## 2010-12-24 DIAGNOSIS — R928 Other abnormal and inconclusive findings on diagnostic imaging of breast: Secondary | ICD-10-CM

## 2010-12-31 ENCOUNTER — Ambulatory Visit
Admission: RE | Admit: 2010-12-31 | Discharge: 2010-12-31 | Disposition: A | Discharge status: Home / Self Care | Payer: Medicare Other | Source: Ambulatory Visit | Attending: Internal Medicine | Admitting: Internal Medicine

## 2010-12-31 ENCOUNTER — Other Ambulatory Visit: Payer: Medicare Other

## 2010-12-31 DIAGNOSIS — R928 Other abnormal and inconclusive findings on diagnostic imaging of breast: Secondary | ICD-10-CM

## 2011-02-17 ENCOUNTER — Encounter: Payer: Self-pay | Admitting: Internal Medicine

## 2011-02-18 ENCOUNTER — Encounter: Payer: Self-pay | Admitting: Internal Medicine

## 2011-02-18 ENCOUNTER — Ambulatory Visit (INDEPENDENT_AMBULATORY_CARE_PROVIDER_SITE_OTHER): Payer: Medicare PPO | Admitting: Internal Medicine

## 2011-02-18 VITALS — BP 140/78 | HR 76 | Temp 97.4°F | Ht 67.0 in | Wt 263.6 lb

## 2011-02-18 DIAGNOSIS — R0609 Other forms of dyspnea: Secondary | ICD-10-CM

## 2011-02-18 DIAGNOSIS — R0989 Other specified symptoms and signs involving the circulatory and respiratory systems: Secondary | ICD-10-CM

## 2011-02-18 DIAGNOSIS — R05 Cough: Secondary | ICD-10-CM

## 2011-02-18 DIAGNOSIS — R053 Chronic cough: Secondary | ICD-10-CM

## 2011-02-18 DIAGNOSIS — R0600 Dyspnea, unspecified: Secondary | ICD-10-CM

## 2011-02-18 DIAGNOSIS — R059 Cough, unspecified: Secondary | ICD-10-CM

## 2011-02-18 NOTE — Patient Instructions (Addendum)
Cough is from sinus drainage, vocal cord dysfunction, acid reflux and possible cough all conspiring to cause cyclical cough/LPR cough #Sinus drainage  - continue nasal steroid - refer to [place] [facility] Dr. Shoemaker or Dr Bates #Possible Acid Reflux  - continue protonix   - take diet sheet from us - avoid colas, spices, cheeses, spirits, red meats, beer, chocolates, fried foods etc.,   - sleep with head end of bed elevated  - eat small frequent meals  - do not go to bed for 3 hours after last meal - stop fish oil and flaxseed for now #Asthma   - continue advair for now - might have to consider changing over to something else later depending on ENT evaluation #Vocal Cord Dysfunction - see ENT doctor first  -later  will consider referral to speech therapy with Mr Carl Schinke #BP  - stop lisinopril - take benicar 1 tablet daily - take sample, nurse will add it in med list #Followup - I will see you in 4-6 weeks.  - Reattempt spirometry with Tammy Wilson at time of followup -Important you comply with advice 100% correctly  

## 2011-02-18 NOTE — Assessment & Plan Note (Signed)
Cough is from sinus drainage, vocal cord dysfunction, acid reflux and possible cough all conspiring to cause cyclical cough/LPR cough #Sinus drainage  - continue nasal steroid - refer to [facility] Dr. Annalee Genta or Dr Jenne Pane #Possible Acid Reflux  - continue protonix   - take diet sheet from [place] - avoid colas, spices, cheeses, spirits, red meats, beer, chocolates, fried foods etc.,   - sleep with head end of bed elevated  - eat small frequent meals  - do not go to bed for 3 hours after last meal - stop fish oil and flaxseed for now #Asthma   - continue advair for now - might have to consider changing over to something else later depending on ENT evaluation #Vocal Cord Dysfunction - see ENT doctor first  -later  will consider referral to speech therapy with Mr Verdie Mosher #BP  - stop lisinopril - take benicar 1 tablet daily - take sample, nurse will add it in med list #Followup - I will see you in 4-6 weeks.  - Reattempt spirometry with Jerolyn Shin at time of followup -Important you comply with advice 100% correctly

## 2011-02-18 NOTE — Assessment & Plan Note (Signed)
Multifactorial - obesity, anxiety, vcd  Plan Address after taclking cough

## 2011-02-18 NOTE — Progress Notes (Signed)
Subjective:    Patient ID: [name], female    DOB: 07-17-1955, 55 y.o.   MRN: [id number]  HPI IOV 02/17/2011. 55 year old female. Obese. AA female. Remote smoker On cpap for OSA (pmd managing). REferred by Dr Delfina Redwood.    New visit for chronic cough. Preesent for many years. Insidious onset.  Progressively with more frequency past few years. Cough rated as moderate. Usually dry cough with associated barking quality. Occ mucus present; thick and clear. Cough worsened by milk products, change in weather and dust. Hx of prior ENT eval in [place] > 15 years ago: diagnosed as "vocal cords sticking together" and remote hx of sinus surgery Also diagnosed with GERD in [place] > 15 years ago; takes protonix.  [facility] Reflux Symptom INdex score is 39 and very c/w LPR cough(severe hoarsness of voice, clearing of throat, ecess throat mucus, post nasal drip, coughing after lying down or eating, choking epidoses, sensation of globus with food sticking in thorat) and associated heart burn. Cough so severe that she could not complete spirometry in office today  Has dyspnea too: few years, insidious onset. Progressive. Rates it as moderate. Dyspnea brought on by associated chest tightness and nasal drainage or even exertion like climbing flight of steps. Dyspnea relieved by rest. Cough and dyspnea associated with wheezing and constant yellow sinus drainage as well. Frequent nocturnal awakenings with choking present. Above resulted in asthma diagnosis versus tracheobronchiolotis > 15 years ago: on advair since then which she is unsure is helping  Associated med intake shows tramadol (for shoulder pain and sicatica), fish oil/ flaxseed (2 years) and ACE inhibitor intake (been on lisniopril for > 10 years due to diabetes)   [facility] med hx review fromn outside record notes "hx of asthma and vocal cord dysfunction and post nasal drip. Recalls frequent ER visits atleast one per month (last er visit 2 months ago per hx) [facility]. Denies ICU admits, intubations for same.    Review of Systems  Constitutional: Negative for fever and unexpected weight change.  HENT: Positive for congestion and postnasal drip. Negative for ear pain, nosebleeds, sore throat, rhinorrhea, sneezing, trouble swallowing, dental problem and sinus pressure.   Eyes: Negative for redness and itching.  Respiratory: Positive for cough, chest tightness, shortness of breath and wheezing.   Cardiovascular: Positive for chest pain, palpitations and leg swelling.  Gastrointestinal: Negative for nausea and vomiting.  Genitourinary: Negative for dysuria.  Musculoskeletal: Negative for joint swelling.  Skin: Negative for rash.  Neurological: Negative for headaches.  Hematological: Does not bruise/bleed easily.  Psychiatric/Behavioral: Negative for dysphoric mood. The patient is not nervous/anxious.        Objective:   Physical Exam  Vitals reviewed. Constitutional: She is oriented to person, place, and time. She appears well-developed and well-nourished. No distress.       Morbidly obese Barking cough periodically and frequently Clearing throat all the time  HENT:  Head: Normocephalic and atraumatic.  Right Ear: External ear normal.  Left Ear: External ear normal.  Mouth/Throat: Oropharynx is clear and moist. No oropharyngeal exudate.  Eyes: Conjunctivae and EOM are normal. Pupils are equal, round, and reactive to light. Right eye exhibits no discharge. Left eye exhibits no discharge. No scleral icterus.  Neck: Normal range of motion. Neck supple. No JVD present. No tracheal deviation present. No thyromegaly present.       mallampatti class 4  Cardiovascular: Normal rate, regular rhythm, normal heart sounds and intact distal pulses.  Exam reveals  no gallop and no friction rub.   No murmur heard. Pulmonary/Chest: Effort normal and breath sounds normal. No respiratory distress. She has no wheezes. She has no rales. She exhibits no  tenderness.  Abdominal: Soft. Bowel sounds are normal. She exhibits no distension and no mass. There is no tenderness. There is no rebound and no guarding.  Musculoskeletal: Normal range of motion. She exhibits no edema and no tenderness.  Lymphadenopathy:    She has no cervical adenopathy.  Neurological: She is alert and oriented to person, place, and time. She has normal reflexes. No cranial nerve deficit. She exhibits normal muscle tone. Coordination normal.  Skin: Skin is warm and dry. No rash noted. She is not diaphoretic. No erythema. No pallor.  Psychiatric: She has a normal mood and affect. Her behavior is normal. Judgment and thought content normal.          Assessment & Plan:

## 2011-03-10 ENCOUNTER — Telehealth: Payer: Self-pay | Admitting: Internal Medicine

## 2011-03-10 MED ORDER — OLMESARTAN MEDOXOMIL 20 MG PO TABS: 20.0000 mg | ORAL_TABLET | Freq: Every day | ORAL | Status: DC

## 2011-03-10 NOTE — Telephone Encounter (Signed)
No samples available. Rx sent. Pt aware.[name], CMA

## 2011-03-26 ENCOUNTER — Ambulatory Visit: Payer: Medicare PPO | Admitting: Internal Medicine

## 2011-03-31 ENCOUNTER — Ambulatory Visit (INDEPENDENT_AMBULATORY_CARE_PROVIDER_SITE_OTHER): Payer: Medicare PPO | Admitting: Internal Medicine

## 2011-03-31 ENCOUNTER — Encounter: Payer: Self-pay | Admitting: Internal Medicine

## 2011-03-31 VITALS — BP 118/68 | HR 73 | Temp 98.4°F | Ht 66.0 in | Wt 265.0 lb

## 2011-03-31 DIAGNOSIS — R05 Cough: Secondary | ICD-10-CM

## 2011-03-31 DIAGNOSIS — R059 Cough, unspecified: Secondary | ICD-10-CM

## 2011-03-31 DIAGNOSIS — R0609 Other forms of dyspnea: Secondary | ICD-10-CM

## 2011-03-31 DIAGNOSIS — R0600 Dyspnea, unspecified: Secondary | ICD-10-CM

## 2011-03-31 DIAGNOSIS — R053 Chronic cough: Secondary | ICD-10-CM

## 2011-03-31 DIAGNOSIS — R0989 Other specified symptoms and signs involving the circulatory and respiratory systems: Secondary | ICD-10-CM

## 2011-03-31 LAB — PULMONARY FUNCTION TEST

## 2011-03-31 MED ORDER — AEROCHAMBER Z-STAT PLUS CHAMBR MISC: Status: DC

## 2011-03-31 MED ORDER — FLUTICASONE-SALMETEROL 115-21 MCG/ACT IN AERO: 1.0000 | INHALATION_SPRAY | Freq: Two times a day (BID) | RESPIRATORY_TRACT | Status: DC

## 2011-03-31 NOTE — Progress Notes (Signed)
Spirometry before and after today.

## 2011-03-31 NOTE — Patient Instructions (Addendum)
Cough is from sinus drainage, vocal cord dysfunction, acid reflux and possible cough all conspiring to cause cyclical cough/LPR cough #Sinus drainage  - continue nasal steroid - nurse will do script for generic fluticasone - Not sure why you saw Dr Haroldine Laws when I referred you to [facility]; my coordinators will clarify that for you.  #Acid Reflux  - continue protonix   - take diet sheet from [place] [employer] - avoid colas, spices, cheeses, spirits, red meats, beer, chocolates, fried foods etc.,   - sleep with head end of bed elevated  - eat small frequent meals  - do not go to bed for 3 hours after last meal - cotninue to avoid fish oil and flaxseed  #Asthma   - continue advair but nurse will give you sample of HFA and teach you how to take it with spacer; she will do script as well  #Vocal Cord Dysfunction - referral to speech therapy with Mr Verdie Mosher  #BP  - will add lisinopril to allergy list - take benicar 1 tablet daily or the equivalent Dr Nehemiah Settle gives you  #Followup - I will see you in 8 weeks.  - -Important you comply with advice 100% correctly - Glad you are much better - Flu shot today please

## 2011-03-31 NOTE — Assessment & Plan Note (Addendum)
Much improved after following instructions. RSI score dropped from 39 to 14. She appears happy. She appears honest and compliant.   PLAN ( Cough is from sinus drainage, vocal cord dysfunction, acid reflux and possible cough all conspiring to cause cyclical cough/LPR cough #Sinus drainage  - continue nasal steroid - nurse will do script for generic fluticasone - Not sure why you saw Dr Haroldine Laws when I referred you to [facility]; my coordinators will clarify that for you.  #Acid Reflux  - continue protonix   - take diet sheet from [place] [employer] - avoid colas, spices, cheeses, spirits, red meats, beer, chocolates, fried foods etc.,   - sleep with head end of bed elevated  - eat small frequent meals  - do not go to bed for 3 hours after last meal - cotninue to avoid fish oil and flaxseed  #Asthma   - continue advair but nurse will give you sample of HFA and teach you how to take it with spacer; she will do script as well  #Vocal Cord Dysfunction - referral to speech therapy with Mr Verdie Mosher  #BP  - will add lisinopril to allergy list - take benicar 1 tablet daily or the equivalent Dr Nehemiah Settle gives you  #Followup - I will see you in 8 weeks.  - -Important you comply with advice 100% correctly - Glad you are much better - Flu shot today please

## 2011-03-31 NOTE — Progress Notes (Signed)
Subjective:    Patient ID: [name], female    DOB: Jan 30, 1956, 55 y.o.   MRN: [id number]  HPI IOV 02/17/2011. 55 year old female. Obese. AA female. Remote smoker On cpap for OSA (pmd managing). REferred by Dr Nehemiah Settle.    New visit for chronic cough. Preesent for many years. Insidious onset.  Progressively with more frequency past few years. Cough rated as moderate. Usually dry cough with associated barking quality. Occ mucus present; thick and clear. Cough worsened by milk products, change in weather and dust. Hx of prior ENT eval in [place] > 15 years ago: diagnosed as "vocal cords sticking together" and remote hx of sinus surgery Also diagnosed with GERD in [place] > 15 years ago; takes protonix.  [facility] Reflux Symptom INdex score is 39 and very c/w LPR cough(severe hoarsness of voice, clearing of throat, ecess throat mucus, post nasal drip, coughing after lying down or eating, choking epidoses, sensation of globus with food sticking in thorat) and associated heart burn. Cough so severe that she could not complete spirometry in office today  Has dyspnea too: few years, insidious onset. Progressive. Rates it as moderate. Dyspnea brought on by associated chest tightness and nasal drainage or even exertion like climbing flight of steps. Dyspnea relieved by rest. Cough and dyspnea associated with wheezing and constant yellow sinus drainage as well. Frequent nocturnal awakenings with choking present. Above resulted in asthma diagnosis versus tracheobronchiolotis > 15 years ago: on advair since then which she is unsure is helping  Associated med intake shows tramadol (for shoulder pain and sicatica), fish oil/ flaxseed (2 years) and ACE inhibitor intake (been on lisniopril for > 10 years due to diabetes)   [facility] med hx review fromn outside record notes "hx of asthma and vocal cord dysfunction and post nasal drip. Recalls frequent ER visits atleast one per month (last er visit 2 months ago per hx) [facility]. Denies ICU admits, intubations for same.   REC Cough is from ACE Inhibitor, sinus drainage, vocal cord dysfunction, acid reflux and possible cough all conspiring to cause cyclical cough/LPR cough #Sinus drainage  - continue nasal steroid - refer to [facility]NT Dr. Annalee Genta or Dr Jenne Pane #Possible Acid Reflux  - continue protonix   - take diet sheet from [place] - avoid colas, spices, cheeses, spirits, red meats, beer, chocolates, fried foods etc.,   - sleep with head end of bed elevated  - eat small frequent meals  - do not go to bed for 3 hours after last meal - stop fish oil and flaxseed for now #Asthma   - continue advair for now - might have to consider changing over to something else later depending on ENT evaluation #Vocal Cord Dysfunction - see ENT doctor first  -later  will consider referral to speech therapy with Mr Verdie Mosher #BP  - stop lisinopril - take benicar 1 tablet daily - take sample, nurse will add it in med list #Followup - I will see you in 4-6 weeks.  - Reattempt spirometry with Jerolyn Shin at time of followup -Important you comply with advice 100% correctly   OV 03/31/11: Followup cough. SAw ENT 89/17./12: no anatomic path or VCD noticed at time of exam though patient noted to have choked.  She states that she was not happy with her experience at ENT visit and does not want to visit the same office again. She is compliant with instructions except for diet though she has made improvements there. Off lisniopril.  Follows sinus, gerd and astham advice. Does not like advair diskus. Overall cough is much better. She is happy with improvement. RSI cough score dropped from 39 to 14.  She sses her hoarsenss, clearing and pos nasal drip as mild-moderate  PFTS show no more flow volume loop issues. There is restriction with mixed obstruction. DLCO 67%  Past Medical History  Diagnosis Date  . Diabetes mellitus   . Sleep apnea   . HTN (hypertension)   . Obesity     . Asthma   . Sciatica   . Rotator cuff tear      Family History  Problem Relation Age of Onset  . Heart disease Mother   . Stomach cancer Maternal Grandmother   . Asthma Son   . Asthma Daughter   . Asthma Mother      History   Social History  . Marital Status: Married    Spouse Name: N/A    Number of Children: N/A  . Years of Education: N/A   Occupational History  . unemployed    Social History Main Topics  . Smoking status: Former Smoker -- 1.0 packs/day for 25 years    Types: Cigarettes    Quit date: 07/13/1996  . Smokeless tobacco: Not on file  . Alcohol Use: No  . Drug Use: No  . Sexually Active: Not on file   Other Topics Concern  . Not on file   Social History Narrative  . No narrative on file     No Known Allergies   Outpatient Prescriptions Prior to Visit  Medication Sig Dispense Refill  . albuterol (VENTOLIN HFA) 108 (90 BASE) MCG/ACT inhaler Inhale 2 puffs into the lungs every 6 (six) hours as needed.        Marland Kitchen aspirin 81 MG tablet Take 81 mg by mouth daily.        Marland Kitchen atorvastatin (LIPITOR) 20 MG tablet Take 20 mg by mouth daily.        . Fluticasone-Salmeterol (ADVAIR DISKUS) 250-50 MCG/DOSE AEPB Inhale 1 puff into the lungs every 12 (twelve) hours.        . insulin detemir (LEVEMIR) 100 UNIT/ML injection Inject 52 Units into the skin 2 (two) times daily.        Marland Kitchen loratadine (CLARITIN) 10 MG tablet Take 10 mg by mouth daily.        . metFORMIN (GLUCOPHAGE) 500 MG tablet Take 500 mg by mouth 2 (two) times daily with a meal.        . mometasone (NASONEX) 50 MCG/ACT nasal spray Place 2 sprays into the nose daily.        Marland Kitchen olmesartan (BENICAR) 20 MG tablet Take 1 tablet (20 mg total) by mouth daily.  30 tablet  11  . pantoprazole (PROTONIX) 40 MG tablet Take 40 mg by mouth daily.        . pioglitazone (ACTOS) 30 MG tablet Take 30 mg by mouth daily.        . traMADol (ULTRAM) 50 MG tablet Take 50 mg by mouth every 6 (six) hours as needed.        .  vitamin D, CHOLECALCIFEROL, 400 UNITS tablet Take 400 Units by mouth daily.        . fish oil-omega-3 fatty acids 1000 MG capsule Take 2 g by mouth daily.        . Flaxseed, Linseed, (FLAX SEED OIL) 1000 MG CAPS Take 1 capsule by mouth daily.        Marland Kitchen  lisinopril (PRINIVIL,ZESTRIL) 10 MG tablet Take 10 mg by mouth daily.             Review of Systems  Constitutional: Negative for fever and unexpected weight change.  HENT: Negative for ear pain, nosebleeds, congestion, sore throat, rhinorrhea, sneezing, trouble swallowing, dental problem, postnasal drip and sinus pressure.   Eyes: Negative for redness and itching.  Respiratory: Positive for cough. Negative for chest tightness, shortness of breath and wheezing.   Cardiovascular: Negative for palpitations and leg swelling.  Gastrointestinal: Negative for nausea and vomiting.  Genitourinary: Negative for dysuria.  Musculoskeletal: Negative for joint swelling.  Skin: Negative for rash.  Neurological: Negative for headaches.  Hematological: Does not bruise/bleed easily.  Psychiatric/Behavioral: Negative for dysphoric mood. The patient is not nervous/anxious.        Objective:   Physical Exam  Vitals reviewed. Constitutional: She is oriented to person, place, and time. She appears well-developed and well-nourished. No distress.       Morbidly obese Barking cough periodically and frequently Clearing throat all the time  HENT:  Head: Normocephalic and atraumatic.  Right Ear: External ear normal.  Left Ear: External ear normal.  Mouth/Throat: Oropharynx is clear and moist. No oropharyngeal exudate.  Eyes: Conjunctivae and EOM are normal. Pupils are equal, round, and reactive to light. Right eye exhibits no discharge. Left eye exhibits no discharge. No scleral icterus.  Neck: Normal range of motion. Neck supple. No JVD present. No tracheal deviation present. No thyromegaly present.       mallampatti class 4  Cardiovascular: Normal rate,  regular rhythm, normal heart sounds and intact distal pulses.  Exam reveals no gallop and no friction rub.   No murmur heard. Pulmonary/Chest: Effort normal and breath sounds normal. No respiratory distress. She has no wheezes. She has no rales. She exhibits no tenderness.  Abdominal: Soft. Bowel sounds are normal. She exhibits no distension and no mass. There is no tenderness. There is no rebound and no guarding.  Musculoskeletal: Normal range of motion. She exhibits no edema and no tenderness.  Lymphadenopathy:    She has no cervical adenopathy.  Neurological: She is alert and oriented to person, place, and time. She has normal reflexes. No cranial nerve deficit. She exhibits normal muscle tone. Coordination normal.  Skin: Skin is warm and dry. No rash noted. She is not diaphoretic. No erythema. No pallor.  Psychiatric: She has a normal mood and affect. Her behavior is normal. Judgment and thought content normal.        Assessment & Plan:

## 2011-04-09 ENCOUNTER — Ambulatory Visit: Payer: Medicare PPO | Attending: Internal Medicine

## 2011-04-09 DIAGNOSIS — IMO0001 Reserved for inherently not codable concepts without codable children: Secondary | ICD-10-CM | POA: Insufficient documentation

## 2011-04-09 DIAGNOSIS — R498 Other voice and resonance disorders: Secondary | ICD-10-CM | POA: Insufficient documentation

## 2011-04-13 LAB — POCT I-STAT, CHEM 8
BUN: 7
Calcium, Ion: 1.1 — ABNORMAL LOW
Chloride: 101
Creatinine, Ser: 1
Glucose, Bld: 376 — ABNORMAL HIGH
HCT: 51 — ABNORMAL HIGH
Hemoglobin: 17.3 — ABNORMAL HIGH
Potassium: 4.2
Sodium: 134 — ABNORMAL LOW
TCO2: 27

## 2011-04-13 LAB — B-NATRIURETIC PEPTIDE (CONVERTED LAB): Pro B Natriuretic peptide (BNP): 34

## 2011-04-13 LAB — DIFFERENTIAL
Basophils Absolute: 0.1
Basophils Relative: 2 — ABNORMAL HIGH
Eosinophils Absolute: 0.1
Eosinophils Relative: 3
Lymphocytes Relative: 38
Lymphs Abs: 1.8
Monocytes Absolute: 0.3
Monocytes Relative: 6
Neutro Abs: 2.4
Neutrophils Relative %: 51

## 2011-04-13 LAB — CBC
HCT: 46.7 — ABNORMAL HIGH
Hemoglobin: 15.3 — ABNORMAL HIGH
MCHC: 32.8
MCV: 83.2
Platelets: 240
RBC: 5.61 — ABNORMAL HIGH
RDW: 14.8
WBC: 4.8

## 2011-04-14 ENCOUNTER — Ambulatory Visit: Payer: Medicare PPO | Attending: Internal Medicine

## 2011-04-14 DIAGNOSIS — R498 Other voice and resonance disorders: Secondary | ICD-10-CM | POA: Insufficient documentation

## 2011-04-14 DIAGNOSIS — IMO0001 Reserved for inherently not codable concepts without codable children: Secondary | ICD-10-CM | POA: Insufficient documentation

## 2011-04-15 ENCOUNTER — Telehealth: Payer: Self-pay | Admitting: Internal Medicine

## 2011-04-15 NOTE — Telephone Encounter (Addendum)
Benicar PA initiated with [employer] at [phone]. Member ID # [id number]. Awaiting fax to move ahead with PA.  Alternatives are:  Diovan, Losartan, Irbesartan, Avapro and Micardis. These may also require a PA.  PA form received and placed in MR look at.

## 2011-04-20 ENCOUNTER — Ambulatory Visit: Payer: Medicare PPO

## 2011-04-27 ENCOUNTER — Ambulatory Visit: Payer: Medicare PPO

## 2011-04-30 NOTE — Telephone Encounter (Signed)
Form completed and faxed to [facility] at [phone].  Form placed in triage to await approval/denial. Carron Curie, CMA

## 2011-05-07 NOTE — Telephone Encounter (Signed)
Called to change this at the pharmacy and was informed that Dr. Nehemiah Settle had already changed the pt' from Benicar to Losartan 100 mg. I have updated the pt's medication list and will forward to MR so he is aware.

## 2011-05-07 NOTE — Telephone Encounter (Signed)
Called [employer] to check on the status of PA for Benicar. Request for coverage has been DENIED. They are requesting that the patient first try and fail Losartan, Diovan or Irbesartan. These may also require PA per representative. Pls advise advise on changing to one of the above mentioned medications.

## 2011-05-07 NOTE — Telephone Encounter (Signed)
25mg  losartan per day is fine

## 2011-06-08 ENCOUNTER — Ambulatory Visit: Payer: Medicare PPO | Admitting: Internal Medicine

## 2011-06-11 ENCOUNTER — Ambulatory Visit: Payer: Medicare PPO | Admitting: Internal Medicine

## 2011-06-26 ENCOUNTER — Encounter: Payer: Self-pay | Admitting: Internal Medicine

## 2011-06-26 ENCOUNTER — Ambulatory Visit (INDEPENDENT_AMBULATORY_CARE_PROVIDER_SITE_OTHER): Payer: Medicare PPO | Admitting: Internal Medicine

## 2011-06-26 VITALS — BP 128/72 | HR 73 | Temp 98.8°F | Ht 66.0 in | Wt 262.6 lb

## 2011-06-26 DIAGNOSIS — R053 Chronic cough: Secondary | ICD-10-CM

## 2011-06-26 DIAGNOSIS — R05 Cough: Secondary | ICD-10-CM

## 2011-06-26 DIAGNOSIS — R059 Cough, unspecified: Secondary | ICD-10-CM

## 2011-06-26 NOTE — Assessment & Plan Note (Addendum)
Cough is from sinus drainage, vocal cord dysfunction, acid reflux and possible asthma or allergiesh all working together to cause cyclical cough/LPR cough  #Sinus drainage  - continue nasal steroid - nurse will do script for generic fluticasone -  #Acid Reflux  - continue protonix   - contniue (REALLY IMPORANT IS DIET) diet sheet from [place] a avoid colas, spices, cheeses, spirits, red meats, beer, chocolates, fried foods etc.,   - sleep with head end of bed elevated  - eat small frequent meals  - do not go to bed for 3 hours after last meal - cotninue to avoid fish oil and flaxseed   #Asthma   - since diagnosis of asthma was in doubt. Please stop Advair and just use albuterol as needed -We'll do spirometry at followup  # allergies - Please see Dr. Jetty Duhamel in our office for allergy evaluation; will do referral  #Vocal Cord Dysfunction - congratulations on graduating from program with speech therapy  Mr [name]  #BP  -  Continue losartan  #Followup - I will see you in 8 weeks.  - -Important you comply with advice 100% correctly - Glad you are much better - Cough score worksheet at followup

## 2011-06-26 NOTE — Progress Notes (Signed)
Subjective:    Patient ID: [name], female    DOB: Jan 01, 1956, 55 y.o.   MRN: [id number]  HPI  OV 02/17/2011. 55 year old female. Obese. AA female. Remote smoker On cpap for OSA (pmd managing). REferred by Dr Nehemiah Settle.    New visit for chronic cough. Preesent for many years. Insidious onset.  Progressively with more frequency past few years. Cough rated as moderate. Usually dry cough with associated barking quality. Occ mucus present; thick and clear. Cough worsened by milk products, change in weather and dust. Hx of prior ENT eval in [place] > 15 years ago: diagnosed as "vocal cords sticking together" and remote hx of sinus surgery Also diagnosed with GERD in [place] > 15 years ago; takes protonix.  [facility] Reflux Symptom INdex score is 39 and very c/w LPR cough(severe hoarsness of voice, clearing of throat, ecess throat mucus, post nasal drip, coughing after lying down or eating, choking epidoses, sensation of globus with food sticking in thorat) and associated heart burn. Cough so severe that she could not complete spirometry in office today  Has dyspnea too: few years, insidious onset. Progressive. Rates it as moderate. Dyspnea brought on by associated chest tightness and nasal drainage or even exertion like climbing flight of steps. Dyspnea relieved by rest. Cough and dyspnea associated with wheezing and constant yellow sinus drainage as well. Frequent nocturnal awakenings with choking present. Above resulted in asthma diagnosis versus tracheobronchiolotis > 15 years ago: on advair since then which she is unsure is helping. She herself is not convinced she has asthma.   Associated med intake shows tramadol (for shoulder pain and sicatica), fish oil/ flaxseed (2 years) and ACE inhibitor intake (been on lisniopril for > 10 years due to diabetes)   [facility] med hx review fromn outside record notes "hx of asthma and vocal cord dysfunction and post nasal drip. Recalls frequent ER visits atleast one per  month (last er visit 2 months ago per hx) to [place]. Denies ICU admits, intubations for same.   REC Cough is from ACE Inhibitor, sinus drainage, vocal cord dysfunction, acid reflux and possible cough all conspiring to cause cyclical cough/LPR cough #Sinus drainage  - continue nasal steroid - refer to [facility] ENT Dr. Annalee Genta or Dr Jenne Pane #Possible Acid Reflux  - continue protonix   - take diet sheet from [place] - avoid colas, spices, cheeses, spirits, red meats, beer, chocolates, fried foods etc.,   - sleep with head end of bed elevated  - eat small frequent meals  - do not go to bed for 3 hours after last meal - stop fish oil and flaxseed for now #Asthma   - continue advair for now - might have to consider changing over to something else later depending on ENT evaluation #Vocal Cord Dysfunction - see ENT doctor first  -later  will consider referral to speech therapy with Mr Verdie Mosher #BP  - stop lisinopril - take benicar 1 tablet daily - take sample, nurse will add it in med list #Followup - I will see you in 4-6 weeks.  - Reattempt spirometry with Jerolyn Shin at time of followup -Important you comply with advice 100% correctly   OV 03/31/11: Followup cough. SAw ENT 89/17./12: no anatomic path or VCD noticed at time of exam though patient noted to have choked.  She states that she was not happy with her experience at ENT visit and does not want to visit the same office again. She is compliant with instructions except  for diet though she has made improvements there. Off lisniopril. Follows sinus, gerd and astham advice. Does not like advair diskus. Overall cough is much better. She is happy with improvement. RSI cough score dropped from 39 to 14.  She sses her hoarsenss, clearing and pos nasal drip as mild-moderate  PFTS show no more flow volume loop issues. There is restriction with mixed obstruction. DLCO 67%   REC Cough is from sinus drainage, vocal cord dysfunction, acid  reflux and possible cough all conspiring to cause cyclical cough/LPR cough  #Sinus drainage  - continue nasal steroid - nurse will do script for generic fluticasone -  #Acid Reflux  - continue protonix   - take diet sheet from [place] [facility] - avoid colas, spices, cheeses, spirits, red meats, beer, chocolates, fried foods etc.,   - sleep with head end of bed elevated  - eat small frequent meals  - do not go to bed for 3 hours after last meal - cotninue to avoid fish oil and flaxseed   #Asthma   - continue advair but nurse will give you sample of HFA and teach you how to take it with spacer; she will do script as well  #Vocal Cord Dysfunction - referral to speech therapy with Mr Verdie Mosher  #BP  - will add lisinopril to allergy list - take benicar 1 tablet daily or the equivalent Dr Nehemiah Settle gives you  #Followup - I will see you in 8 weeks.  - -Important you comply with advice 100% correctly - Glad you are much better - Flu shot today please   OV 06/26/2011 Followup for chronic cough. She continues to do better. Subjectively feels much better compared to last visit. Although objectively her cough score has only reduced by 1 point. Today, RSI cough score is 13. Level III postnasal drip. Level to hoarseness of voice, cough after lying down, choking episodes, and annoying cough. Level I sensation of lump in throat and heartburn.  In talking to her she is compliant with her nasal steroid and her Protonix for acid reflux but not compliant with diet for acid reflux. She is on Advair HFA with spacer for presumed asthma and she feels this makes her cough less compared to Advair discus. We discussed more about her asthma history and she says that she was never sure she had asthma diagnosis in [place] it was a presumptive diagnosis. She is eager to come off Advair and give a trial without it. She also gives a history of allergies and allergy shots while living in [place] and she is open to an  allergy evaluation right now. In terms of cyclical cough she has finished speech therapy evaluation and graduated from the program. Overall she feels quality-of-life is acceptable although she would like to see cough improve further. So far we have not tried Neurontin    Past, Family, Social reviewed: no change since last visit except that Dr Eula Listen is new PMD. She now states she has year round allergies. Recollects while in [place] used to take allergy shots twice a week 10 years ago. Since moving to [place] 4 years ago not seen an allergist. Liliane Shi to see allergist. She feels she does not have asthma.   Review of Systems Main issue is cough rest of systems is negative or per history of the present illness    Objective:   Physical Exam  Vitals reviewed. Constitutional: She is oriented to person, place, and time. She appears well-developed and well-nourished. No distress.  Morbidly obese NO LONGER Barking cough  NO LONGER Clearing throat all the time  HENT:  Head: Normocephalic and atraumatic.  Right Ear: External ear normal.  Left Ear: External ear normal.  Mouth/Throat: Oropharynx is clear and moist. No oropharyngeal exudate.  Eyes: Conjunctivae and EOM are normal. Pupils are equal, round, and reactive to light. Right eye exhibits no discharge. Left eye exhibits no discharge. No scleral icterus.  Neck: Normal range of motion. Neck supple. No JVD present. No tracheal deviation present. No thyromegaly present.       mallampatti class 4  Cardiovascular: Normal rate, regular rhythm, normal heart sounds and intact distal pulses.  Exam reveals no gallop and no friction rub.   No murmur heard. Pulmonary/Chest: Effort normal and breath sounds normal. No respiratory distress. She has no wheezes. She has no rales. She exhibits no tenderness.  Abdominal: Soft. Bowel sounds are normal. She exhibits no distension and no mass. There is no tenderness. There is no rebound and no guarding.    Musculoskeletal: Normal range of motion. She exhibits no edema and no tenderness.  Lymphadenopathy:    She has no cervical adenopathy.  Neurological: She is alert and oriented to person, place, and time. She has normal reflexes. No cranial nerve deficit. She exhibits normal muscle tone. Coordination normal.  Skin: Skin is warm and dry. No rash noted. She is not diaphoretic. No erythema. No pallor.  Psychiatric: She has a normal mood and affect. Her behavior is normal. Judgment and thought content normal.         Assessment & Plan:

## 2011-06-26 NOTE — Patient Instructions (Signed)
Cough is from sinus drainage, vocal cord dysfunction, acid reflux and possible asthma or allergiesh all working together to cause cyclical cough/LPR cough  #Sinus drainage  - continue nasal steroid - nurse will do script for generic fluticasone -  #Acid Reflux  - continue protonix   - contniue (REALLY IMPORANT IS DIET) diet sheet from [place] [facility] - avoid colas, spices, cheeses, spirits, red meats, beer, chocolates, fried foods etc.,   - sleep with head end of bed elevated  - eat small frequent meals  - do not go to bed for 3 hours after last meal - cotninue to avoid fish oil and flaxseed   #Asthma   - since diagnosis of asthma was in doubt. Please stop Advair and just use albuterol as needed -We'll do spirometry at followup  # allergies - Please see Dr. Jetty Duhamel in our office for allergy evaluation; will do referral  #Vocal Cord Dysfunction - congratulations on graduating from program with speech therapy  Mr [name]  #BP  -  Continue losartan  #Followup - I will see you in 8 weeks.  - -Important you comply with advice 100% correctly - Glad you are much better - Cough score worksheet at followup

## 2011-07-28 ENCOUNTER — Institutional Professional Consult (permissible substitution): Payer: Medicare PPO | Admitting: Internal Medicine

## 2011-08-06 ENCOUNTER — Other Ambulatory Visit: Payer: Medicare Other

## 2011-08-06 ENCOUNTER — Encounter: Payer: Self-pay | Admitting: Internal Medicine

## 2011-08-06 ENCOUNTER — Ambulatory Visit (INDEPENDENT_AMBULATORY_CARE_PROVIDER_SITE_OTHER): Payer: Medicare Other | Admitting: Internal Medicine

## 2011-08-06 VITALS — BP 138/72 | HR 85 | Ht 67.0 in | Wt 256.0 lb

## 2011-08-06 DIAGNOSIS — J309 Allergic rhinitis, unspecified: Secondary | ICD-10-CM

## 2011-08-06 DIAGNOSIS — R053 Chronic cough: Secondary | ICD-10-CM

## 2011-08-06 DIAGNOSIS — G4733 Obstructive sleep apnea (adult) (pediatric): Secondary | ICD-10-CM

## 2011-08-06 DIAGNOSIS — R05 Cough: Secondary | ICD-10-CM

## 2011-08-06 DIAGNOSIS — R059 Cough, unspecified: Secondary | ICD-10-CM

## 2011-08-06 NOTE — Progress Notes (Signed)
08/06/11- 35 yoF former smoker referred by Dr Marchelle Gearing who has been seeing her for multifactorial cough, questioning an allergic component. 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 She gives a history of perennial rhinitis, asthma and bronchitis starting many years ago as an adult. Triggers include temperature changes, seasonal changes, in and out of doors. Nasal congestion and postnasal drainage are worst in spring and fall, with house dust and around cigarette smoke. History of chronic bronchitis. Had sinus surgery. Obstructive sleep apnea on CPAP/Advanced. She snores through the mask. Does not have a managing physician. Nasal congestion at night makes this worse. Family history positive for allergies and asthma with little detail recalled.  ROS-see HPI Constitutional:   No-   weight loss, night sweats, fevers, chills, fatigue, lassitude. HEENT:   No-  headaches, difficulty swallowing, tooth/dental problems, sore throat,       + sneezing, itching, ear ache, nasal congestion, post nasal drip,  CV:  No-   chest pain, orthopnea, PND, swelling in lower extremities, anasarca, dizziness, palpitations Resp: No-   shortness of breath with exertion or at rest.              No-   productive cough,  + non-productive cough,  No- coughing up of blood.              No-   change in color of mucus.  No recent wheezing.   Skin: No-   rash or lesions. GI:  No-   heartburn, indigestion, abdominal pain, nausea, vomiting, diarrhea,                 change in bowel habits, loss of appetite GU:  MS:  No-   joint pain or swelling.  No- decreased range of motion.  No- back pain. Neuro-     nothing unusual Psych:  No- change in mood or affect. No depression or anxiety.  No memory loss.  OBJ General- Alert, Oriented, Affect-appropriate, Distress- none acute, obese Skin- rash-none, lesions- none, excoriation- none Lymphadenopathy- none Head- atraumatic  Eyes- Gross vision intact, PERRLA, conjunctivae clear secretions            Ears- Hearing, canals-normal            Nose- +Turbinate edema, no-Septal dev, mucus, polyps, erosion, perforation             Throat- Mallampati IV , mucosa clear , drainage- none, tonsils- atrophic. +Frequent throat clearing. Neck- flexible , trachea midline, no stridor , thyroid nl, carotid no bruit Chest - symmetrical excursion , unlabored           Heart/CV- RRR , no murmur , no gallop  , no rub, nl s1 s2                           - JVD- none , edema- none, stasis changes- none, varices- none           Lung- clear to P&A, wheeze- none, cough- none , dullness-none, rub- none           Chest wall-  Abd-  Br/ Gen/ Rectal- Not done, not indicated Extrem- cyanosis- none, clubbing, none, atrophy- none, strength- nl Neuro- grossly intact to observation

## 2011-08-06 NOTE — Patient Instructions (Addendum)
Order- Allergy profile   Dx allergic rhinitis  Schedule return for allergy skin testing - Please do not take any antihistamines for 3 days before the allergy skin testing. This includes Claritin/ loratadine, the sample of patanase nasal spray, otc sleep and cough/ cold meds.   Sample Patanase antihistamine nasal spray- try   1-2 puffs each nostril, up to twice daily if needed  Order- DME Advanced  autotitrate CPAP  5-15 cwp  For pressure recommendation   X 2 weeks     Dx OSA

## 2011-08-08 DIAGNOSIS — J309 Allergic rhinitis, unspecified: Secondary | ICD-10-CM | POA: Insufficient documentation

## 2011-08-08 DIAGNOSIS — G4733 Obstructive sleep apnea (adult) (pediatric): Secondary | ICD-10-CM | POA: Insufficient documentation

## 2011-08-08 NOTE — Assessment & Plan Note (Signed)
Originally diagnosed through [facility] but she is no longer followed there. She does not know her pressure and says she is snoring through the machine. Plan-auto titrate for pressure recommendation. We will find the original diagnostic study for our record.

## 2011-08-08 NOTE — Assessment & Plan Note (Signed)
She describes a nonspecific perennial rhinitis. We will look for atopy with an allergy profile and skin test.

## 2011-08-08 NOTE — Assessment & Plan Note (Signed)
Question whether her cough and rhinitis have an atopic basis. Watching her, I am impressed by her repeated throat clearing, which suggests reflux, repeated self irritation or postnasal drainage. Plan: Allergy profile and return for skin testing.

## 2011-08-10 LAB — ALLERGY FULL PROFILE
Allergen, D pternoyssinus,d7: <0.1 kU/L (ref <0.35)
Allergen,Goose feathers, e70: <0.1 kU/L (ref <0.35)
Alternaria Alternata: <0.1 kU/L (ref <0.35)
Aspergillus fumigatus, IgG: <0.1 kU/L (ref <0.35)
Bahia Grass: <0.1 kU/L (ref <0.35)
Bermuda Grass: <0.1 kU/L (ref <0.35)
Box Elder IgE: 0.84 kU/L — ABNORMAL HIGH (ref <0.35)
Candida Albicans: <0.1 kU/L (ref <0.35)
Cat Dander: <0.1 kU/L (ref <0.35)
Common Ragweed: <0.1 kU/L (ref <0.35)
Curvularia lunata: <0.1 kU/L (ref <0.35)
D. farinae: <0.1 kU/L (ref <0.35)
Dog Dander: <0.1 kU/L (ref <0.35)
Elm IgE: <0.1 kU/L (ref <0.35)
Fescue: <0.1 kU/L (ref <0.35)
G005 Rye, Perennial: <0.1 kU/L (ref <0.35)
G009 Red Top: <0.1 kU/L (ref <0.35)
Goldenrod: <0.1 kU/L (ref <0.35)
Helminthosporium halodes: <0.1 kU/L (ref <0.35)
House Dust Hollister: <0.1 kU/L (ref <0.35)
IgE (Immunoglobulin E), Serum: 34.6 IU/mL (ref 0.0–180.0)
Lamb's Quarters: <0.1 kU/L (ref <0.35)
Oak: 0.15 kU/L (ref <0.35)
Plantain: <0.1 kU/L (ref <0.35)
Stemphylium Botryosum: <0.1 kU/L (ref <0.35)
Sycamore Tree: <0.1 kU/L (ref <0.35)
Timothy Grass: <0.1 kU/L (ref <0.35)

## 2011-08-12 NOTE — Progress Notes (Signed)
Quick Note:  Pt aware of results. ______ 

## 2011-08-19 ENCOUNTER — Other Ambulatory Visit: Payer: Self-pay | Admitting: Internal Medicine

## 2011-08-19 ENCOUNTER — Encounter: Payer: Self-pay | Admitting: Internal Medicine

## 2011-08-19 ENCOUNTER — Other Ambulatory Visit (INDEPENDENT_AMBULATORY_CARE_PROVIDER_SITE_OTHER): Payer: Medicare Other

## 2011-08-19 ENCOUNTER — Ambulatory Visit (INDEPENDENT_AMBULATORY_CARE_PROVIDER_SITE_OTHER): Payer: Medicare Other | Admitting: Internal Medicine

## 2011-08-19 VITALS — BP 120/60 | HR 67 | Ht 67.0 in | Wt 255.6 lb

## 2011-08-19 DIAGNOSIS — R0600 Dyspnea, unspecified: Secondary | ICD-10-CM

## 2011-08-19 DIAGNOSIS — K219 Gastro-esophageal reflux disease without esophagitis: Secondary | ICD-10-CM

## 2011-08-19 DIAGNOSIS — R0609 Other forms of dyspnea: Secondary | ICD-10-CM

## 2011-08-19 DIAGNOSIS — R0989 Other specified symptoms and signs involving the circulatory and respiratory systems: Secondary | ICD-10-CM

## 2011-08-19 DIAGNOSIS — R079 Chest pain, unspecified: Secondary | ICD-10-CM

## 2011-08-19 DIAGNOSIS — J309 Allergic rhinitis, unspecified: Secondary | ICD-10-CM

## 2011-08-19 LAB — CARDIAC PANEL
CK-MB: 2.7 ng/mL (ref 0.3–4.0)
Relative Index: 1.8 calc (ref 0.0–2.5)
Total CK: 152 U/L (ref 7–177)

## 2011-08-19 MED ORDER — PREDNISONE 20 MG PO TABS: 20.0000 mg | ORAL_TABLET | Freq: Every day | ORAL | Status: AC

## 2011-08-19 MED ORDER — LEVALBUTEROL HCL 0.63 MG/3ML IN NEBU
0.6300 mg | INHALATION_SOLUTION | Freq: Once | RESPIRATORY_TRACT | Status: AC
  Administered 2011-08-19: 0.63 mg via RESPIRATORY_TRACT

## 2011-08-19 NOTE — Assessment & Plan Note (Signed)
We'll reschedule allergy skin testing for later date.

## 2011-08-19 NOTE — Patient Instructions (Addendum)
Neb xop 0.63  Script prednisone 20 mg- 1 daily x 3 days- sent to [facility]  EKG done  Order- Cardiac enzyme panel dx chest pain  Take your acid blocker every day before breakfast.  Take a liquid antacid like Mylanta or Gaviscon, or chew Tums, for active heart burn Don't lie down for 2 hours after meals  Elevate head of bed on a brick under each head leg.  If chest pain gets worse- go to ER  Keep appointment with Dr Donette Larry tomorrow.

## 2011-08-19 NOTE — Assessment & Plan Note (Signed)
Chest pain which she described as persistent through the weekend and interpreted by her as heartburn. I reviewed the EKG with Dr. Craige Cotta and we felt it showed no acute process. I think she had a reflux event as she arrived here, triggering wheeze.  Plan-we deferred allergy skin testing. Given nebulizer treatment with Xopenex and sent prescription for prednisone 20 mg daily x3 days. Instructions for reflux precautions and aggressive reflux management. She is to keep her scheduled appointment with her primary physician tomorrow. Cardiac enzymes were being drawn.

## 2011-08-19 NOTE — Progress Notes (Signed)
08/06/11- 56 yoF former smoker referred by Dr Marchelle Gearing who has been seeing her for multifactorial cough, questioning an allergic component. 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 She gives a history of perennial rhinitis, asthma and bronchitis starting many years ago as an adult. Triggers include temperature changes, seasonal changes, in and out of doors. Nasal congestion and postnasal drainage are worst in spring and fall, with house dust and around cigarette smoke. History of chronic bronchitis. Had sinus surgery. Obstructive sleep apnea on CPAP/Advanced. She snores through the mask. Does not have a managing physician. Nasal congestion at night makes this worse. Family history positive for allergies and asthma with little detail recalled.  08/19/11- 73 yoF former smoker referred by Dr Marchelle Gearing who has been seeing her for multifactorial cough, questioning an allergic component She came today as scheduled for allergy skin testing. On arrival she complained to the nurse of substernal pain with soreness in her left upper anterior chest or shoulder. Pulse was a little irregular and she described malaise. The nurse had her lie down on the exam table and described the patient as "glassy eyed", relieved by sitting her back up. EKG: Sinus rhythm 71 per minute with PACs poor R wave progression. Nonspecific ST- T abnormality with question of very slight ST segment elevation especially in the inferolateral leads.  The patient described significant heartburn all weekend, with a lot of gas. She took protonix and said it helped almost immediately. She also gave history of left shoulder arthritis. She reported starting to wheeze almost on arrival at  our office.  ROS-see HPI Constitutional:   No-   weight loss, night sweats, fevers, chills, fatigue, lassitude. HEENT:   No-  headaches, difficulty swallowing, tooth/dental problems, sore throat,       + sneezing,  itching, ear ache, nasal congestion, post nasal drip,  CV:  +  chest pain, orthopnea, PND,  No-swelling in lower extremities, anasarca, dizziness, palpitations Resp: No-   shortness of breath with exertion or at rest.             + productive cough,  + non-productive cough,  No- coughing up of blood.              No-   change in color of mucus. +recent wheezing.   Skin: No-   rash or lesions. GI:  + heartburn, indigestion,  +abdominal pain, nausea, vomiting, diarrhea,                 change in bowel habits, loss of appetite GU:  MS:  No-   joint pain or swelling.  No- decreased range of motion.  No- back pain. Neuro-     nothing unusual Psych:  No- change in mood or affect. No depression or anxiety.  No memory loss.  OBJ General- Alert, Oriented, Affect-appropriate, Distress- none acute, obese Skin- rash-none, lesions- none, excoriation- none. Not diaphoretic Lymphadenopathy- none Head- atraumatic            Eyes- Gross vision intact, PERRLA, conjunctivae clear secretions            Ears- Hearing, canals-normal            Nose- +Turbinate edema, no-Septal dev, mucus, polyps, erosion, perforation             Throat- Mallampati IV , mucosa clear , drainage- none, tonsils- atrophic. . Neck- flexible , trachea midline, no stridor , thyroid nl, carotid no bruit Chest - symmetrical  excursion , unlabored           Heart/CV- RRR , no murmur , no gallop  , no rub, nl s1 s2                           - JVD- none , edema- none, stasis changes- none, varices- none           Lung- raspy wheeze, cough- none , dullness-none, rub- none           Chest wall-  Abd-  Br/ Gen/ Rectal- Not done, not indicated Extrem- cyanosis- none, clubbing, none, atrophy- none, strength- nl Neuro- grossly intact to observation

## 2011-08-20 LAB — TROPONIN I: Troponin I: <0.01 ng/mL (ref <0.06)

## 2011-08-25 ENCOUNTER — Ambulatory Visit (INDEPENDENT_AMBULATORY_CARE_PROVIDER_SITE_OTHER): Payer: Medicare Other | Admitting: Internal Medicine

## 2011-08-25 ENCOUNTER — Encounter: Payer: Self-pay | Admitting: Internal Medicine

## 2011-08-25 VITALS — BP 132/88 | HR 83 | Ht 67.0 in | Wt 255.4 lb

## 2011-08-25 DIAGNOSIS — R05 Cough: Secondary | ICD-10-CM

## 2011-08-25 DIAGNOSIS — R053 Chronic cough: Secondary | ICD-10-CM

## 2011-08-25 DIAGNOSIS — R059 Cough, unspecified: Secondary | ICD-10-CM

## 2011-08-25 DIAGNOSIS — R079 Chest pain, unspecified: Secondary | ICD-10-CM

## 2011-08-25 NOTE — Patient Instructions (Addendum)
#  Chest pains  - please see [place] cardiology asap #Cough  - for sinus: follow Dr Maple Hudson recommendation - for acid reflux: follow medications and diet - for possible asthma: stay off advair and once cardiology ok, please have methacholine challenge test #Followup  -  complete cardiology visit and methacholine challenge  - 4-6 weeks from now

## 2011-08-25 NOTE — Progress Notes (Signed)
Subjective:    Patient ID: [name], female    DOB: 1956-05-12, 56 y.o.   MRN: [id number]  HPI OV 02/17/2011. 56 year old female. Obese. AA female. Remote smoker On cpap for OSA (pmd managing). REferred by Dr Delfina Redwood.    New visit for chronic cough. Preesent for many years. Insidious onset.  Progressively with more frequency past few years. Cough rated as moderate. Usually dry cough with associated barking quality. Occ mucus present; thick and clear. Cough worsened by milk products, change in weather and dust. Hx of prior ENT eval in [place] > 15 years ago: diagnosed as "vocal cords sticking together" and remote hx of sinus surgery Also diagnosed with GERD in [place] > 15 years ago; takes protonix.  [facility] Reflux Symptom INdex score is 39 and very c/w LPR cough(severe hoarsness of voice, clearing of throat, ecess throat mucus, post nasal drip, coughing after lying down or eating, choking epidoses, sensation of globus with food sticking in thorat) and associated heart burn. Cough so severe that she could not complete spirometry in office today  Has dyspnea too: few years, insidious onset. Progressive. Rates it as moderate. Dyspnea brought on by associated chest tightness and nasal drainage or even exertion like climbing flight of steps. Dyspnea relieved by rest. Cough and dyspnea associated with wheezing and constant yellow sinus drainage as well. Frequent nocturnal awakenings with choking present. Above resulted in asthma diagnosis versus tracheobronchiolotis > 15 years ago: on advair since then which she is unsure is helping. She herself is not convinced she has asthma.   Associated med intake shows tramadol (for shoulder pain and sicatica), fish oil/ flaxseed (2 years) and ACE inhibitor intake (been on lisniopril for > 10 years due to diabetes)   Pas med hx review fromn outside record notes "hx of asthma and vocal cord dysfunction and post nasal drip. Recalls frequent ER visits atleast one per  month (last er visit 2 months ago per hx) to [facility]. Denies ICU admits, intubations for same.   REC Cough is from ACE Inhibitor, sinus drainage, vocal cord dysfunction, acid reflux and possible cough all conspiring to cause cyclical cough/LPR cough #Sinus drainage  - continue nasal steroid - refer to [facility] ENT Dr. Wilburn Cornelia or Dr Redmond Baseman #Possible Acid Reflux  - continue protonix   - take diet sheet from [place] - avoid colas, spices, cheeses, spirits, red meats, beer, chocolates, fried foods etc.,   - sleep with head end of bed elevated  - eat small frequent meals  - do not go to bed for 3 hours after last meal - stop fish oil and flaxseed for now #Asthma   - continue advair for now - might have to consider changing over to something else later depending on ENT evaluation #Vocal Cord Dysfunction - see ENT doctor first  -later  will consider referral to speech therapy with [name] #BP  - stop lisinopril - take benicar 1 tablet daily - take sample, nurse will add it in med list #Followup - I will see you in 4-6 weeks.  - Reattempt spirometry with [name] at time of followup -Important you comply with advice 100% correctly   OV 03/31/11: Followup cough. SAw ENT 89/17./12: no anatomic path or VCD noticed at time of exam though patient noted to have choked.  She states that she was not happy with her experience at ENT visit and does not want to visit the same office again. She is compliant with instructions except for  diet though she has made improvements there. Off lisniopril. Follows sinus, gerd and astham advice. Does not like advair diskus. Overall cough is much better. She is happy with improvement. RSI cough score dropped from 39 to 14.  She sses her hoarsenss, clearing and pos nasal drip as mild-moderate  PFTS show no more flow volume loop issues. There is restriction with mixed obstruction. DLCO 67%   REC Cough is from sinus drainage, vocal cord dysfunction, acid  reflux and possible cough all conspiring to cause cyclical cough/LPR cough  #Sinus drainage  - continue nasal steroid - nurse will do script for generic fluticasone -  #Acid Reflux  - continue protonix   - take diet sheet from [place] [employer] - avoid colas, spices, cheeses, spirits, red meats, beer, chocolates, fried foods etc.,   - sleep with head end of bed elevated  - eat small frequent meals  - do not go to bed for 3 hours after last meal - cotninue to avoid fish oil and flaxseed   #Asthma   - continue advair but nurse will give you sample of HFA and teach you how to take it with spacer; she will do script as well  #Vocal Cord Dysfunction - referral to speech therapy with [name]  #BP  - will add lisinopril to allergy list - take benicar 1 tablet daily or the equivalent Dr Delfina Redwood gives you  #Followup - I will see you in 8 weeks.  - -Important you comply with advice 100% correctly - Glad you are much better - Flu shot today please   OV 06/26/2011 Followup for chronic cough. She continues to do better. Subjectively feels much better compared to last visit. Although objectively her cough score has only reduced by 1 point. Today, RSI cough score is 13. Level III postnasal drip. Level to hoarseness of voice, cough after lying down, choking episodes, and annoying cough. Level I sensation of lump in throat and heartburn.  In talking to her she is compliant with her nasal steroid and her Protonix for acid reflux but not compliant with diet for acid reflux. She is on Advair HFA with spacer for presumed asthma and she feels this makes her cough less compared to Advair discus. We discussed more about her asthma history and she says that she was never sure she had asthma diagnosis in [place] it was a presumptive diagnosis. She is eager to come off Advair and give a trial without it. She also gives a history of allergies and allergy shots while living in [place] and she is open to an  allergy evaluation right now. In terms of cyclical cough she has finished speech therapy evaluation and graduated from the program. Overall she feels quality-of-life is acceptable although she would like to see cough improve further. So far we have not tried Neurontin    Past, Family, Social reviewed: no change since last visit except that Dr Deforest Hoyles is new PMD. She now states she has year round allergies. Recollects while in [place] used to take allergy shots twice a week 10 years ago. Since moving to [place] 4 years ago not seen an allergist. [name] to see allergist. She feels she does not have asthma.  Cough is from sinus drainage, vocal cord dysfunction, acid reflux and possible asthma or allergiesh all working together to cause cyclical cough/LPR cough  #Sinus drainage  - continue nasal steroid - nurse will do script for generic fluticasone  -  #Acid Reflux  - continue protonix  -  contniue (REALLY IMPORANT IS DIET) diet sheet from [place] [employer] - avoid colas, spices, cheeses, spirits, red meats, beer, chocolates, fried foods etc.,  - sleep with head end of bed elevated  - eat small frequent meals  - do not go to bed for 3 hours after last meal  - cotninue to avoid fish oil and flaxseed  #Asthma  - since diagnosis of asthma was in doubt. Please stop Advair and just use albuterol as needed  -We'll do spirometry at followup  # allergies  - Please see Dr. Baird Lyons in our office for allergy evaluation; will do referral  #Vocal Cord Dysfunction  - congratulations on graduating from program with speech therapy [name]  #BP  - Continue losartan  #Followup  - I will see you in 8 weeks.  - -Important you comply with advice 100% correctly  - Glad you are much better  - Cough score worksheet at followup   OV 08/25/2011 Followup for chronic cough. She is off advair as advised. She is doing gerd control.  Ojectively her cough score is not reduced. Last visit score was 13 but now it is 14.  (Level III postnasal drip, and post nasal drip. . Level 2  hoarseness of voice and choking episodes. Level 1 - cough after lying down,and  sensation of lump in throat and heartburn). Howevefr, subjectively cough much improved. Still with post nasal drip. Has seen Dr Annamaria Boots for allergy 08/06/11 and 08/19/11. RAST serum profile negative except for elevated [place] IGE. Allergy skin test pending 09/16/11. On visit 08/19/11 to Dr Annamaria Boots had atypical chest pain - releived with ppi in office. EKG okay. Trop was normal. . Of note, Dr Annamaria Boots is also addressing her prior OSA issues.   ROS c/o anxiety, insomnia, also chest pains  - atypical  Spirometry  - fev1 1.74L/72%. Ratio 78    Lab 08/19/11 1721  TROPONINI <0.01      Review of Systems  Constitutional: Negative for fever and unexpected weight change.  HENT: Negative for ear pain, nosebleeds, congestion, sore throat, rhinorrhea, sneezing, trouble swallowing, dental problem, postnasal drip and sinus pressure.   Eyes: Negative for redness and itching.  Respiratory: Negative for cough, chest tightness, shortness of breath and wheezing.   Cardiovascular: Positive for chest pain. Negative for palpitations and leg swelling.  Gastrointestinal: Negative for nausea and vomiting.  Genitourinary: Negative for dysuria.  Musculoskeletal: Negative for joint swelling.  Skin: Negative for rash.  Neurological: Negative for headaches.  Hematological: Does not bruise/bleed easily.  Psychiatric/Behavioral: Negative for dysphoric mood. The patient is not nervous/anxious.        Objective:   Physical Exam Vitals reviewed. Constitutional: She is oriented to person, place, and time. She appears well-developed and well-nourished. No distress.       Morbidly obese NO LONGER Barking cough  NO LONGER Clearing throat all the time  HENT:  Head: Normocephalic and atraumatic.  Right Ear: External ear normal.  Left Ear: External ear normal.  Mouth/Throat: Oropharynx is  clear and moist. No oropharyngeal exudate.  Eyes: Conjunctivae and EOM are normal. Pupils are equal, round, and reactive to light. Right eye exhibits no discharge. Left eye exhibits no discharge. No scleral icterus.  Neck: Normal range of motion. Neck supple. No JVD present. No tracheal deviation present. No thyromegaly present.       mallampatti class 4  Cardiovascular: Normal rate, regular rhythm, normal heart sounds and intact distal pulses.  Exam reveals no gallop and no  friction rub.   No murmur heard. Pulmonary/Chest: Effort normal and breath sounds normal. No respiratory distress. She has no wheezes. She has no rales. She exhibits no tenderness.  Abdominal: Soft. Bowel sounds are normal. She exhibits no distension and no mass. There is no tenderness. There is no rebound and no guarding.  Musculoskeletal: Normal range of motion. She exhibits no edema and no tenderness.  Lymphadenopathy:    She has no cervical adenopathy.  Neurological: She is alert and oriented to person, place, and time. She has normal reflexes. No cranial nerve deficit. She exhibits normal muscle tone. Coordination normal.  Skin: Skin is warm and dry. No rash noted. She is not diaphoretic. No erythema. No pallor.  Psychiatric: She has a normal mood and affect. Her behavior is normal. Judgment and thought content normal.         Assessment & Plan:

## 2011-08-26 ENCOUNTER — Ambulatory Visit (INDEPENDENT_AMBULATORY_CARE_PROVIDER_SITE_OTHER): Payer: Medicare Other | Admitting: Cardiovascular Disease

## 2011-08-26 ENCOUNTER — Encounter: Payer: Self-pay | Admitting: Cardiovascular Disease

## 2011-08-26 DIAGNOSIS — R079 Chest pain, unspecified: Secondary | ICD-10-CM | POA: Insufficient documentation

## 2011-08-26 DIAGNOSIS — Z862 Personal history of diseases of the blood and blood-forming organs and certain disorders involving the immune mechanism: Secondary | ICD-10-CM

## 2011-08-26 DIAGNOSIS — I10 Essential (primary) hypertension: Secondary | ICD-10-CM

## 2011-08-26 NOTE — Assessment & Plan Note (Signed)
Blood pressure is well-controlled on her current medical program. 

## 2011-08-26 NOTE — Patient Instructions (Signed)
Your physician has requested that you have an exercise stress myoview. For further information please visit [email or web address] Please follow instruction sheet, as given.  Your physician recommends that you schedule a follow-up appointment in: 4 WEEKS  Your physician recommends that you continue on your current medications as directed. Please refer to the Current Medication list given to you today.

## 2011-08-26 NOTE — Assessment & Plan Note (Signed)
The patient has chest pain, primarily with atypical features. However, she has a very high risk profile for coronary artery disease. I think we should proceed with repeat stress testing as it has been 4 years since her last stress test and she has new symptoms. She think she can exercise so will order an exercise Myoview scan. If she requires pharmacologic stress testing, I would recommend use of dobutamine. The patient is on appropriate medical therapy with aspirin, a statin drug, and an ARB.  We'll plan on followup after her stress test is completed.

## 2011-08-26 NOTE — Assessment & Plan Note (Signed)
The patient is on a statin drug. Her lipids have been followed by her primary care physician.

## 2011-08-26 NOTE — Progress Notes (Signed)
HPI:  This is a 56 year old woman presenting for initial evaluation of chest pain. She has multiple cardiovascular risk factors including diabetes, hypertension, hypercholesterolemia, family history of coronary disease, and former tobacco use.  The patient has multiple complaints, including left shoulder pain and central chest pressure. This occurs with emotional stress. She has no symptoms with physical exertion. She also complains of palpitations and dyspnea. She complains of chronic leg swelling. The patient denies lightheadedness, syncope, orthopnea, or PND.  She had a stress test in 2009 and she tells me was normal. The patient is treated for obstructive sleep apnea.  She doesn't know specific details of her family history, but she thinks her mother had coronary bypass surgery and stenting procedures done in her 8s.  Outpatient Encounter Prescriptions as of 08/26/2011  Medication Sig Dispense Refill  . ALPRAZolam (XANAX) 0.5 MG tablet Take 0.5 mg by mouth 2 (two) times daily.      Marland Kitchen aspirin 81 MG tablet Take 81 mg by mouth 2 (two) times daily.       Marland Kitchen atorvastatin (LIPITOR) 20 MG tablet Take 20 mg by mouth daily.        . fluticasone (FLONASE) 50 MCG/ACT nasal spray Place 2 sprays into the nose daily.        Marland Kitchen ibuprofen (ADVIL,MOTRIN) 800 MG tablet Take 800 mg by mouth every 8 (eight) hours as needed.      . insulin detemir (LEVEMIR) 100 UNIT/ML injection Inject 60 Units into the skin 2 (two) times daily.       Marland Kitchen JANUVIA 100 MG tablet Take 1 tablet by mouth daily.      Marland Kitchen loratadine (CLARITIN) 10 MG tablet Take 10 mg by mouth daily.        Marland Kitchen losartan (COZAAR) 100 MG tablet Take 100 mg by mouth daily.        . metFORMIN (GLUCOPHAGE) 500 MG tablet Take 1,000 mg by mouth 2 (two) times daily with a meal.       . mometasone (NASONEX) 50 MCG/ACT nasal spray Place 2 sprays into the nose daily.        . pantoprazole (PROTONIX) 40 MG tablet Take 40 mg by mouth daily.        . traMADol (ULTRAM) 50 MG  tablet Take 50 mg by mouth every 6 (six) hours as needed.        . vitamin D, CHOLECALCIFEROL, 400 UNITS tablet Take 400 Units by mouth daily.        Marland Kitchen DISCONTD: albuterol (VENTOLIN HFA) 108 (90 BASE) MCG/ACT inhaler Inhale 2 puffs into the lungs every 6 (six) hours as needed.          Lisinopril  Past Medical History  Diagnosis Date  . Diabetes mellitus   . Sleep apnea   . HTN (hypertension)   . Obesity   . Asthma   . Sciatica   . Rotator cuff tear   . Disorder of vocal cord     Past Surgical History  Procedure Date  . Nasal sinus surgery 1992  . Total abdominal hysterectomy 1985    History   Social History  . Marital Status: Married    Spouse Name: N/A    Number of Children: N/A  . Years of Education: N/A   Occupational History  . unemployed    Social History Main Topics  . Smoking status: Former Smoker -- 1.0 packs/day for 25 years    Types: Cigarettes    Quit date: 07/13/1996  .  Smokeless tobacco: Never Used  . Alcohol Use: No  . Drug Use: No  . Sexually Active: Not on file   Other Topics Concern  . Not on file   Social History Narrative  . No narrative on file    Family History  Problem Relation Age of Onset  . Heart disease Mother   . Stomach cancer Maternal Grandmother   . Asthma Son   . Asthma Daughter   . Asthma Mother     ROS: General: no fevers/chills/night sweats Eyes: no blurry vision, diplopia, or amaurosis ENT: no sore throat or hearing loss Resp: positive for cough, wheezing, and dyspnea CV: no edema or palpitations GI: no abdominal pain, nausea, vomiting, diarrhea, or constipation GU: no dysuria, frequency, or hematuria Skin: no rash Neuro: no headache, numbness, tingling, or weakness of extremities Musculoskeletal: no joint pain or swelling Heme: no bleeding, DVT, or easy bruising Endo: no polydipsia or polyuria  BP 120/68  Pulse 80  Ht 5\' 6"  (1.676 m)  Wt 114.814 kg (253 lb 1.9 oz)  BMI 40.85 kg/m2  PHYSICAL EXAM: Pt  is alert and oriented, very nice, obese African American woman, in no distress. HEENT: normal Neck: JVP normal. Carotid upstrokes normal without bruits. No thyromegaly. Lungs: equal expansion, clear inspiration but there are expiratory wheezes present CV: Apex is discrete and nondisplaced, RRR without murmur or gallop Abd: soft, NT, +BS, no bruit, no hepatosplenomegaly Back: no CVA tenderness Ext: no C/C/E        Femoral pulses 2+= without bruits        DP/PT pulses intact and = Skin: warm and dry without rash Neuro: CNII-XII intact             Strength intact = bilaterally  EKG:  Normal sinus rhythm 71 beats per minute, rare PAC   ASSESSMENT AND PLAN:

## 2011-08-27 ENCOUNTER — Telehealth: Payer: Self-pay | Admitting: Internal Medicine

## 2011-08-27 NOTE — Telephone Encounter (Signed)
Kathlene November  PLs let me know once stress test is negative so I can do methacholine challenge test  Thanks MR

## 2011-08-27 NOTE — Assessment & Plan Note (Signed)
Due to risk factors, refer to cardiology

## 2011-08-27 NOTE — Assessment & Plan Note (Addendum)
Will need to rule out asthma. Get methacholine challenge test after seeing cardiology and being cleared for it in terms of chest pain. Till then continue sinus and gerd control

## 2011-09-08 ENCOUNTER — Ambulatory Visit (HOSPITAL_COMMUNITY): Payer: Medicare Other | Attending: Cardiovascular Disease | Admitting: Radiology

## 2011-09-08 DIAGNOSIS — J449 Chronic obstructive pulmonary disease, unspecified: Secondary | ICD-10-CM | POA: Insufficient documentation

## 2011-09-08 DIAGNOSIS — Z8249 Family history of ischemic heart disease and other diseases of the circulatory system: Secondary | ICD-10-CM | POA: Insufficient documentation

## 2011-09-08 DIAGNOSIS — R0602 Shortness of breath: Secondary | ICD-10-CM

## 2011-09-08 DIAGNOSIS — E785 Hyperlipidemia, unspecified: Secondary | ICD-10-CM | POA: Insufficient documentation

## 2011-09-08 DIAGNOSIS — I10 Essential (primary) hypertension: Secondary | ICD-10-CM

## 2011-09-08 DIAGNOSIS — E119 Type 2 diabetes mellitus without complications: Secondary | ICD-10-CM | POA: Insufficient documentation

## 2011-09-08 DIAGNOSIS — R0789 Other chest pain: Secondary | ICD-10-CM

## 2011-09-08 DIAGNOSIS — J4489 Other specified chronic obstructive pulmonary disease: Secondary | ICD-10-CM | POA: Insufficient documentation

## 2011-09-08 DIAGNOSIS — R0989 Other specified symptoms and signs involving the circulatory and respiratory systems: Secondary | ICD-10-CM | POA: Insufficient documentation

## 2011-09-08 DIAGNOSIS — E669 Obesity, unspecified: Secondary | ICD-10-CM | POA: Insufficient documentation

## 2011-09-08 DIAGNOSIS — Z794 Long term (current) use of insulin: Secondary | ICD-10-CM | POA: Insufficient documentation

## 2011-09-08 DIAGNOSIS — R079 Chest pain, unspecified: Secondary | ICD-10-CM | POA: Insufficient documentation

## 2011-09-08 DIAGNOSIS — R5381 Other malaise: Secondary | ICD-10-CM | POA: Insufficient documentation

## 2011-09-08 DIAGNOSIS — Z87891 Personal history of nicotine dependence: Secondary | ICD-10-CM | POA: Insufficient documentation

## 2011-09-08 DIAGNOSIS — R0609 Other forms of dyspnea: Secondary | ICD-10-CM | POA: Insufficient documentation

## 2011-09-08 MED ORDER — TECHNETIUM TC 99M TETROFOSMIN IV KIT
33.0000 | PACK | Freq: Once | INTRAVENOUS | Status: AC | PRN
  Administered 2011-09-08: 33 via INTRAVENOUS

## 2011-09-08 NOTE — Progress Notes (Signed)
[facility] SITE 3 NUCLEAR MED [address] [place] [address] [phone]  Cardiology Nuclear Med Study  [name] is a 56 y.o. female [id number] Nov 15, 1955   Nuclear Med Background Indication for Stress Test:  Evaluation for Ischemia History:  Asthma, COPD and '09 [id number] per patient; '10 Echo:EF=55-60% Cardiac Risk Factors: Family History - CAD, History of Smoking, Hypertension, IDDM Type 2, Lipids and Obesity  Symptoms:  Chest Pain/Pressure>(L) Arm.  (last episode of chest discomfort: now, 6/10), DOE and Fatigue   Nuclear Pre-Procedure Caffeine/Decaff Intake:  None NPO After: 11:30pm   Lungs:  Clear. IV 0.9% NS with Angio Cath:  22g  IV Site: R Hand  IV Started by:  Kristin Parsons, RN  Chest Size (in):  44 Cup Size: DDD  Height: 5\' 6"  (1.676 m)  Weight:  246 lb (111.585 kg)  BMI:  Body mass index is 39.71 kg/(m^2). Tech Comments:  n/a    Nuclear Med Study 1 or 2 day study: 2 day  Stress Test Type:  Stress  Reading MD: [name],M.D. Order Authorizing Provider:  Tonny Bollman, MD  Resting Radionuclide: Technetium 42m Tetrofosmin  Resting Radionuclide Dose: 33.0 mCi  On    09-09-11  Stress Radionuclide:  Technetium 51m Tetrofosmin  Stress Radionuclide Dose: 33.0 mCi  On      09-08-11          Stress Protocol Rest HR: 70 Stress HR: 155  Rest BP: Sitting:130/65  Standing:140/72 Stress BP: 230/75  Exercise Time (min): 4:00 METS: 5.8   Predicted Max HR: 165 bpm % Max HR: 93.94 bpm Rate Pressure Product: 74259   Dose of Adenosine (mg):  n/a Dose of Lexiscan: n/a mg  Dose of Atropine (mg): n/a Dose of Dobutamine: n/a mcg/kg/min (at max HR)  Stress Test Technologist: [name], CMA-N  Nuclear Technologist:  [name], CNMT     Rest Procedure:  Myocardial perfusion imaging was performed at rest 45 minutes following the intravenous administration of Technetium 88m Tetrofosmin.  Rest ECG: Nonspecific T-wave changes.  Stress  Procedure:  The patient exercised for four minutes on the treadmill utilizing the Bruce protocol.  The patient stopped due to fatigue and dyspnea with her O2 SATs dropping to 89% at peak exercise.  She did c/o  chest pain in recovery, radiating to right arm, 6/10, which she had prior to exercise.  There were no diagnostic ST-T wave changes.  She did have a hypertensive response to exercise, 230/75.  Technetium 70m Tetrofosmin was injected at peak exercise and myocardial perfusion imaging was performed after a brief delay.  Stress ECG: No significant ST segment change suggestive of ischemia.  QPS Raw Data Images:  Mild breast attenuation; normal left ventricular size. Stress Images:  There is decreased uptake in the anterior wall. Rest Images:  There is decreased uptake in the anterior wall, less prominent compared to the stress images. Subtraction (SDS):  These findings are consistent with with probable shifting breast attenuation; cannot exclude mild anterior ischemia. Transient Ischemic Dilatation (Normal <1.22):  0.78 Lung/Heart Ratio (Normal <0.45):  0.52  Quantitative Gated Spect Images QGS EDV:  87 ml QGS ESV:  25 ml QGS cine images:  NL LV Function; NL Wall Motion QGS EF: 72%  Impression Exercise Capacity:  Poor exercise capacity. BP Response:  Hypertensive blood pressure response. Clinical Symptoms:  There is chest pain. ECG Impression:  No significant ST segment change suggestive of ischemia. Comparison with Prior Nuclear Study: No images to compare.  Overall  Impression:  Low risk stress nuclear study with a small, partially reversible anterior defect suggestive of shifting breast attenuation; cannot exclude mild anterior ischemia.   [name]

## 2011-09-09 ENCOUNTER — Ambulatory Visit (HOSPITAL_COMMUNITY): Payer: Medicare Other | Attending: Cardiology

## 2011-09-09 DIAGNOSIS — R0989 Other specified symptoms and signs involving the circulatory and respiratory systems: Secondary | ICD-10-CM

## 2011-09-09 MED ORDER — TECHNETIUM TC 99M TETROFOSMIN IV KIT
33.0000 | PACK | Freq: Once | INTRAVENOUS | Status: AC | PRN
  Administered 2011-09-09: 33 via INTRAVENOUS

## 2011-09-11 ENCOUNTER — Ambulatory Visit (HOSPITAL_COMMUNITY)
Admission: RE | Admit: 2011-09-11 | Discharge: 2011-09-11 | Disposition: A | Discharge status: Home / Self Care | Payer: Medicare Other | Source: Ambulatory Visit | Attending: Internal Medicine | Admitting: Internal Medicine

## 2011-09-11 ENCOUNTER — Other Ambulatory Visit: Payer: Self-pay

## 2011-09-11 DIAGNOSIS — R05 Cough: Secondary | ICD-10-CM | POA: Insufficient documentation

## 2011-09-11 DIAGNOSIS — Z87891 Personal history of nicotine dependence: Secondary | ICD-10-CM | POA: Insufficient documentation

## 2011-09-11 DIAGNOSIS — R9430 Abnormal result of cardiovascular function study, unspecified: Secondary | ICD-10-CM

## 2011-09-11 DIAGNOSIS — R079 Chest pain, unspecified: Secondary | ICD-10-CM

## 2011-09-11 DIAGNOSIS — R059 Cough, unspecified: Secondary | ICD-10-CM

## 2011-09-11 LAB — PULMONARY FUNCTION TEST

## 2011-09-11 NOTE — Telephone Encounter (Signed)
Stress test equivocal. Spoke with patient and going to order a coronary CTA to rule out obstructive CAD.

## 2011-09-12 NOTE — Telephone Encounter (Signed)
Thanks Kathlene November. Appreciate it. I will watch out for cath

## 2011-09-15 ENCOUNTER — Encounter: Payer: Self-pay | Admitting: Cardiovascular Disease

## 2011-09-15 ENCOUNTER — Ambulatory Visit (INDEPENDENT_AMBULATORY_CARE_PROVIDER_SITE_OTHER): Payer: Medicare Other | Admitting: Cardiovascular Disease

## 2011-09-15 DIAGNOSIS — R9430 Abnormal result of cardiovascular function study, unspecified: Secondary | ICD-10-CM

## 2011-09-15 DIAGNOSIS — R079 Chest pain, unspecified: Secondary | ICD-10-CM

## 2011-09-15 LAB — BASIC METABOLIC PANEL
BUN: 8 mg/dL (ref 6–23)
CO2: 23 mEq/L (ref 19–32)
Calcium: 8.9 mg/dL (ref 8.4–10.5)
Chloride: 98 mEq/L (ref 96–112)
Creatinine, Ser: 0.8 mg/dL (ref 0.4–1.2)
GFR: 101.27 mL/min (ref >60.00)
Glucose, Bld: 237 mg/dL — ABNORMAL HIGH (ref 70–99)
Potassium: 3.6 mEq/L (ref 3.5–5.1)
Sodium: 141 mEq/L (ref 135–145)

## 2011-09-15 NOTE — Patient Instructions (Addendum)
Your physician has requested that you have cardiac CT. Cardiac computed tomography (CT) is a painless test that uses an x-ray machine to take clear, detailed pictures of your heart. For further information please visit [email or web address] Please follow instruction sheet as given.  DO NOT TAKE METFORMIN THE NIGHT BEFORE, MORNING OF OR 48 HOURS AFTER TEST. ----Dr Excell Seltzer called the insurance company and they denied this test, will continue to treat pt medically  Your physician recommends that you have lab work today: [facility]  Your physician recommends that you schedule a follow-up appointment as needed.

## 2011-09-16 ENCOUNTER — Encounter: Payer: Self-pay | Admitting: Internal Medicine

## 2011-09-16 ENCOUNTER — Ambulatory Visit (INDEPENDENT_AMBULATORY_CARE_PROVIDER_SITE_OTHER): Payer: Medicare Other | Admitting: Internal Medicine

## 2011-09-16 VITALS — BP 112/64 | HR 62 | Ht 67.0 in | Wt 251.6 lb

## 2011-09-16 DIAGNOSIS — J45909 Unspecified asthma, uncomplicated: Secondary | ICD-10-CM

## 2011-09-16 DIAGNOSIS — G4733 Obstructive sleep apnea (adult) (pediatric): Secondary | ICD-10-CM

## 2011-09-16 DIAGNOSIS — J309 Allergic rhinitis, unspecified: Secondary | ICD-10-CM

## 2011-09-16 NOTE — Progress Notes (Deleted)
Subjective:    Patient ID: [name], female    DOB: 11-Jan-1956, 56 y.o.   MRN: [id number]  HPI OV 02/17/2011. 56 year old female. Obese. AA female. Remote smoker On cpap for OSA (pmd managing). REferred by Dr Nehemiah Settle.    New visit for chronic cough. Preesent for many years. Insidious onset.  Progressively with more frequency past few years. Cough rated as moderate. Usually dry cough with associated barking quality. Occ mucus present; thick and clear. Cough worsened by milk products, change in weather and dust. Hx of prior ENT eval in [place] > 15 years ago: diagnosed as "vocal cords sticking together" and remote hx of sinus surgery Also diagnosed with GERD in [place] > 15 years ago; takes protonix.  [facility] Reflux Symptom INdex score is 39 and very c/w LPR cough(severe hoarsness of voice, clearing of throat, ecess throat mucus, post nasal drip, coughing after lying down or eating, choking epidoses, sensation of globus with food sticking in thorat) and associated heart burn. Cough so severe that she could not complete spirometry in office today  Has dyspnea too: few years, insidious onset. Progressive. Rates it as moderate. Dyspnea brought on by associated chest tightness and nasal drainage or even exertion like climbing flight of steps. Dyspnea relieved by rest. Cough and dyspnea associated with wheezing and constant yellow sinus drainage as well. Frequent nocturnal awakenings with choking present. Above resulted in asthma diagnosis versus tracheobronchiolotis > 15 years ago: on advair since then which she is unsure is helping. She herself is not convinced she has asthma.   Associated med intake shows tramadol (for shoulder pain and sicatica), fish oil/ flaxseed (2 years) and ACE inhibitor intake (been on lisniopril for > 10 years due to diabetes)   Past med hx review fromn outside record notes "hx of asthma and vocal cord dysfunction and post nasal drip. Recalls frequent ER visits atleast one per  month (last er visit 2 months ago per hx) to [facility]. Denies ICU admits, intubations for same.   REC Cough is from ACE Inhibitor, sinus drainage, vocal cord dysfunction, acid reflux and possible cough all conspiring to cause cyclical cough/LPR cough #Sinus drainage  - continue nasal steroid - refer to [facility]NT Dr. Annalee Genta or Dr Jenne Pane #Possible Acid Reflux  - continue protonix   - take diet sheet from [place] - avoid colas, spices, cheeses, spirits, red meats, beer, chocolates, fried foods etc.,   - sleep with head end of bed elevated  - eat small frequent meals  - do not go to bed for 3 hours after last meal - stop fish oil and flaxseed for now #Asthma   - continue advair for now - might have to consider changing over to something else later depending on ENT evaluation #Vocal Cord Dysfunction - see ENT doctor first  -later  will consider referral to speech therapy with Mr Verdie Mosher #BP  - stop lisinopril - take benicar 1 tablet daily - take sample, nurse will add it in med list #Followup - I will see you in 4-6 weeks.  - Reattempt spirometry with Jerolyn Shin at time of followup -Important you comply with advice 100% correctly   OV 03/31/11: Followup cough. SAw ENT 89/17./12: no anatomic path or VCD noticed at time of exam though patient noted to have choked.  She states that she was not happy with her experience at ENT visit and does not want to visit the same office again. She is compliant with instructions except for  diet though she has made improvements there. Off lisniopril. Follows sinus, gerd and astham advice. Does not like advair diskus. Overall cough is much better. She is happy with improvement. RSI cough score dropped from 39 to 14.  She sses her hoarsenss, clearing and pos nasal drip as mild-moderate  PFTS show no more flow volume loop issues. There is restriction with mixed obstruction. DLCO 67%   REC Cough is from sinus drainage, vocal cord dysfunction, acid  reflux and possible cough all conspiring to cause cyclical cough/LPR cough  #Sinus drainage  - continue nasal steroid - nurse will do script for generic fluticasone -  #Acid Reflux  - continue protonix   - take diet sheet from [place] [employer] - avoid colas, spices, cheeses, spirits, red meats, beer, chocolates, fried foods etc.,   - sleep with head end of bed elevated  - eat small frequent meals  - do not go to bed for 3 hours after last meal - cotninue to avoid fish oil and flaxseed   #Asthma   - continue advair but nurse will give you sample of HFA and teach you how to take it with spacer; she will do script as well  #Vocal Cord Dysfunction - referral to speech therapy with [name]  #BP  - will add lisinopril to allergy list - take benicar 1 tablet daily or the equivalent Dr Delfina Redwood gives you  #Followup - I will see you in 8 weeks.  - -Important you comply with advice 100% correctly - Glad you are much better - Flu shot today please   OV 06/26/2011 Followup for chronic cough. She continues to do better. Subjectively feels much better compared to last visit. Although objectively her cough score has only reduced by 1 point. Today, RSI cough score is 13. Level III postnasal drip. Level to hoarseness of voice, cough after lying down, choking episodes, and annoying cough. Level I sensation of lump in throat and heartburn.  In talking to her she is compliant with her nasal steroid and her Protonix for acid reflux but not compliant with diet for acid reflux. She is on Advair HFA with spacer for presumed asthma and she feels this makes her cough less compared to Advair discus. We discussed more about her asthma history and she says that she was never sure she had asthma diagnosis in [place] it was a presumptive diagnosis. She is eager to come off Advair and give a trial without it. She also gives a history of allergies and allergy shots while living in [place] and she is open to an  allergy evaluation right now. In terms of cyclical cough she has finished speech therapy evaluation and graduated from the program. Overall she feels quality-of-life is acceptable although she would like to see cough improve further. So far we have not tried Neurontin    Past, Family, Social reviewed: no change since last visit except that Dr Deforest Hoyles is new PMD. She now states she has year round allergies. Recollects while in [place] used to take allergy shots twice a week 10 years ago. Since moving to [place] 4 years ago not seen an allergist. Laverle Hobby to see allergist. She feels she does not have asthma.  Cough is from sinus drainage, vocal cord dysfunction, acid reflux and possible asthma or allergiesh all working together to cause cyclical cough/LPR cough  #Sinus drainage  - continue nasal steroid - nurse will do script for generic fluticasone  -  #Acid Reflux  - continue protonix  -  contniue (REALLY IMPORANT IS DIET) diet sheet from [place] [employer] - avoid colas, spices, cheeses, spirits, red meats, beer, chocolates, fried foods etc.,  - sleep with head end of bed elevated  - eat small frequent meals  - do not go to bed for 3 hours after last meal  - cotninue to avoid fish oil and flaxseed  #Asthma  - since diagnosis of asthma was in doubt. Please stop Advair and just use albuterol as needed  -We'll do spirometry at followup  # allergies  - Please see Dr. Baird Lyons in our office for allergy evaluation; will do referral  #Vocal Cord Dysfunction  - congratulations on graduating from program with speech therapy [name]  #BP  - Continue losartan  #Followup  - I will see you in 8 weeks.  - -Important you comply with advice 100% correctly  - Glad you are much better  - Cough score worksheet at followup   OV 08/25/2011 Followup for chronic cough. She is off advair as advised. She is doing gerd control.  Ojectively her cough score is not reduced. Last visit score was 13 but now it is 14.  (Level III postnasal drip, and post nasal drip. . Level 2  hoarseness of voice and choking episodes. Level 1 - cough after lying down,and  sensation of lump in throat and heartburn). Howevefr, subjectively cough much improved. Still with post nasal drip. Has seen Dr Annamaria Boots for allergy 08/06/11 and 08/19/11. RAST serum profile negative except for elevated [place] IGE. Allergy skin test pending 09/16/11. On visit 08/19/11 to Dr Annamaria Boots had atypical chest pain - releived with ppi in office. EKG okay. Trop was normal. . Of note, Dr Annamaria Boots is also addressing her prior OSA issues.   ROS c/o anxiety, insomnia, also chest pains  - atypical  Spirometry  - fev1 1.74L/72%. Ratio 78    Lab 08/19/11 1721  TROPONINI <0.01      Review of Systems  Constitutional: Negative for fever and unexpected weight change.  HENT: Negative for ear pain, nosebleeds, congestion, sore throat, rhinorrhea, sneezing, trouble swallowing, dental problem, postnasal drip and sinus pressure.   Eyes: Negative for redness and itching.  Respiratory: Negative for cough, chest tightness, shortness of breath and wheezing.   Cardiovascular: Positive for chest pain. Negative for palpitations and leg swelling.  Gastrointestinal: Negative for nausea and vomiting.  Genitourinary: Negative for dysuria.  Musculoskeletal: Negative for joint swelling.  Skin: Negative for rash.  Neurological: Negative for headaches.  Hematological: Does not bruise/bleed easily.  Psychiatric/Behavioral: Negative for dysphoric mood. The patient is not nervous/anxious.        Objective:   Physical Exam Vitals reviewed. Constitutional: She is oriented to person, place, and time. She appears well-developed and well-nourished. No distress.       Morbidly obese NO LONGER Barking cough  NO LONGER Clearing throat all the time  HENT:  Head: Normocephalic and atraumatic.  Right Ear: External ear normal.  Left Ear: External ear normal.  Mouth/Throat: Oropharynx is  clear and moist. No oropharyngeal exudate.  Eyes: Conjunctivae and EOM are normal. Pupils are equal, round, and reactive to light. Right eye exhibits no discharge. Left eye exhibits no discharge. No scleral icterus.  Neck: Normal range of motion. Neck supple. No JVD present. No tracheal deviation present. No thyromegaly present.       mallampatti class 4  Cardiovascular: Normal rate, regular rhythm, normal heart sounds and intact distal pulses.  Exam reveals no gallop and no  friction rub.   No murmur heard. Pulmonary/Chest: Effort normal and breath sounds normal. No respiratory distress. She has no wheezes. She has no rales. She exhibits no tenderness.  Abdominal: Soft. Bowel sounds are normal. She exhibits no distension and no mass. There is no tenderness. There is no rebound and no guarding.  Musculoskeletal: Normal range of motion. She exhibits no edema and no tenderness.  Lymphadenopathy:    She has no cervical adenopathy.  Neurological: She is alert and oriented to person, place, and time. She has normal reflexes. No cranial nerve deficit. She exhibits normal muscle tone. Coordination normal.  Skin: Skin is warm and dry. No rash noted. She is not diaphoretic. No erythema. No pallor.  Psychiatric: She has a normal mood and affect. Her behavior is normal. Judgment and thought content normal.   08/06/11- 44 yoF former smoker referred by Dr Marchelle Gearing who has been seeing her for multifactorial cough, questioning an allergic component. 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 She gives a history of perennial rhinitis, asthma and bronchitis starting many years ago as an adult. Triggers include temperature changes, seasonal changes, in and out of doors. Nasal congestion and postnasal drainage are worst in spring and fall, with house dust and around cigarette smoke. History of chronic bronchitis. Had sinus surgery. Obstructive sleep apnea on  CPAP/Advanced. She snores through the mask. Does not have a managing physician. Nasal congestion at night makes this worse. Family history positive for allergies and asthma with little detail recalled.  08/19/11- 61 yoF former smoker referred by Dr Marchelle Gearing who has been seeing her for multifactorial cough, questioning an allergic component She came today as scheduled for allergy skin testing. On arrival she complained to the nurse of substernal pain with soreness in her left upper anterior chest or shoulder. Pulse was a little irregular and she described malaise. The nurse had her lie down on the exam table and described the patient as "glassy eyed", relieved by sitting her back up. EKG: Sinus rhythm 71 per minute with PACs poor R wave progression. Nonspecific ST- T abnormality with question of very slight ST segment elevation especially in the inferolateral leads.  The patient described significant heartburn all weekend, with a lot of gas. She took protonix and said it helped almost immediately. She also gave history of left shoulder arthritis. She reported starting to wheeze almost on arrival at  our office.  ROS-see HPI Constitutional:   No-   weight loss, night sweats, fevers, chills, fatigue, lassitude. HEENT:   No-  headaches, difficulty swallowing, tooth/dental problems, sore throat,       + sneezing, itching, ear ache, nasal congestion, post nasal drip,  CV:  +  chest pain, orthopnea, PND,  No-swelling in lower extremities, anasarca, dizziness, palpitations Resp: No-   shortness of breath with exertion or at rest.             + productive cough,  + non-productive cough,  No- coughing up of blood.              No-   change in color of mucus. +recent wheezing.   Skin: No-   rash or lesions. GI:  + heartburn, indigestion,  +abdominal pain, nausea, vomiting, diarrhea,                 change in bowel habits, loss of appetite GU:  MS:  No-   joint pain or swelling.  No- decreased range of  motion.  No- back pain. Neuro-     nothing unusual Psych:  No- change in mood or affect. No depression or anxiety.  No memory loss.  OBJ General- Alert, Oriented, Affect-appropriate, Distress- none acute, obese Skin- rash-none, lesions- none, excoriation- none. Not diaphoretic Lymphadenopathy- none Head- atraumatic            Eyes- Gross vision intact, PERRLA, conjunctivae clear secretions            Ears- Hearing, canals-normal            Nose- +Turbinate edema, no-Septal dev, mucus, polyps, erosion, perforation             Throat- Mallampati IV , mucosa clear , drainage- none, tonsils- atrophic. . Neck- flexible , trachea midline, no stridor , thyroid nl, carotid no bruit Chest - symmetrical excursion , unlabored           Heart/CV- RRR , no murmur , no gallop  , no rub, nl s1 s2                           - JVD- none , edema- none, stasis changes- none, varices- none           Lung- raspy wheeze, cough- none , dullness-none, rub- none           Chest wall-  Abd-  Br/ Gen/ Rectal- Not done, not indicated Extrem- cyanosis- none, clubbing, none, atrophy- none, strength- nl Neuro- grossly intact to observation

## 2011-09-16 NOTE — Progress Notes (Signed)
08/06/11- 3 yoF former smoker referred by Dr Marchelle Gearing who has been seeing her for multifactorial cough, questioning an allergic component. 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 She gives a history of perennial rhinitis, asthma and bronchitis starting many years ago as an adult. Triggers include temperature changes, seasonal changes, in and out of doors. Nasal congestion and postnasal drainage are worst in spring and fall, with house dust and around cigarette smoke. History of chronic bronchitis. Had sinus surgery. Obstructive sleep apnea on CPAP/Advanced. She snores through the mask. Does not have a managing physician. Nasal congestion at night makes this worse. Family history positive for allergies and asthma with little detail recalled.  08/19/11- 59 yoF former smoker referred by Dr Marchelle Gearing who has been seeing her for multifactorial cough, questioning an allergic component She came today as scheduled for allergy skin testing. On arrival she complained to the nurse of substernal pain with soreness in her left upper anterior chest or shoulder. Pulse was a little irregular and she described malaise. The nurse had her lie down on the exam table and described the patient as "glassy eyed", relieved by sitting her back up. EKG: Sinus rhythm 71 per minute with PACs poor R wave progression. Nonspecific ST- T abnormality with question of very slight ST segment elevation especially in the inferolateral leads.  The patient described significant heartburn all weekend, with a lot of gas. She took protonix and said it helped almost immediately. She also gave history of left shoulder arthritis. She reported starting to wheeze almost on arrival at  our office.  09/16/11- 56 yoF former smoker referred by Dr Marchelle Gearing who has been seeing her for multifactorial cough, questioning an allergic component At last visit she had presented with chest pain, preventing planned  allergy skin testing. Now following with cardiology and pending CT scan of her heart/Dr. Excell Seltzer. Still complains of nasal congestion. Able to use her CPAP more regularly when her nose is not stopped up. Unable to do methacholine challenge last month because her baseline is from a tree scores were too low. CPAP autotitration done/Advanced. Pending download. PFT- 09/11/11- FVC 2.15/68%, FEV1 1.59/ 64%, FEV1/FVC 0.80. FEF 25-75 % 0.37/ 15%  ROS-see HPI Constitutional:   No-   weight loss, night sweats, fevers, chills, fatigue, lassitude. HEENT:   No-  headaches, difficulty swallowing, tooth/dental problems, sore throat,       + sneezing, itching, ear ache, nasal congestion, post nasal drip,  CV:  +  chest pain, orthopnea, PND,  No-swelling in lower extremities, anasarca, dizziness, palpitations Resp: No-   shortness of breath with exertion or at rest.             + productive cough,  + non-productive cough,  No- coughing up of blood.              No-   change in color of mucus. +recent wheezing.   Skin: No-   rash or lesions. GI:  + heartburn, indigestion,  +abdominal pain, nausea, vomiting,  GU:  MS:  No-   joint pain or swelling.   Neuro-     nothing unusual Psych:  No- change in mood or affect. No depression or anxiety.  No memory loss.  OBJ General- Alert, Oriented, Affect-appropriate, Distress- none acute, obese Skin- rash-none, lesions- none, excoriation- none. Not diaphoretic Lymphadenopathy- none Head- atraumatic            Eyes- Gross vision intact, PERRLA, conjunctivae clear secretions  Ears- Hearing, canals-normal            Nose- +Turbinate edema, sniffing, no-Septal dev, mucus, polyps, erosion, perforation             Throat- Mallampati IV , mucosa clear , drainage- none, tonsils- atrophic. . Neck- flexible , trachea midline, no stridor , thyroid nl, carotid no bruit Chest - symmetrical excursion , unlabored           Heart/CV- RRR , no murmur , no gallop  , no rub,  nl s1 s2                           - JVD- none , edema- none, stasis changes- none, varices- none           Lung- clear, cough- none , dullness-none, rub- none           Chest wall-  Abd-  Br/ Gen/ Rectal- Not done, not indicated Extrem- cyanosis- none, clubbing, none, atrophy- none, strength- nl Neuro- grossly intact to observation

## 2011-09-16 NOTE — Patient Instructions (Signed)
Schedule allergy skin testing- off antihistamines x 3 days- this will include sinus and allergy meds, cough syrups, otc sleep meds  Restart Nasonex- 2 sprays each nostril once every day at bedtime   Ok to use an antihistamine like Claritin/loratadine or Allegra/ fexofenadine  Only if needed, you can use an otc decongestant nose spray like Afrin, just once in each nostril at bedtime, so you can wear your CPAP

## 2011-09-17 ENCOUNTER — Telehealth: Payer: Self-pay | Admitting: Cardiovascular Disease

## 2011-09-17 NOTE — Telephone Encounter (Signed)
Fu call °Patient returning your call °

## 2011-09-17 NOTE — Telephone Encounter (Signed)
I spoke with the pt and made her aware that the Cardiac CTA was denied by her insurance. Per Dr Excell Seltzer he would like the pt to continue medical therapy and contact the office with any symptoms.

## 2011-09-17 NOTE — Progress Notes (Signed)
   HPI:  This is a 56 year old woman presenting for followup evaluation of chest pain. She has multiple cardiovascular risk factors including diabetes, hypertension, hypercholesterolemia, family history of coronary disease, and former tobacco use.  She had a nuclear scan which was low risk. There was a question of shifting breast artifact versus mild anterior ischemia.  The patient is doing better. She denies chest pain at present. Her breathing is stable. She has no new complaints.  Outpatient Encounter Prescriptions as of 09/15/2011  Medication Sig Dispense Refill  . albuterol (PROVENTIL HFA;VENTOLIN HFA) 108 (90 BASE) MCG/ACT inhaler Inhale 2 puffs into the lungs every 6 (six) hours as needed.      . ALPRAZolam (XANAX) 0.5 MG tablet Take 0.5 mg by mouth 2 (two) times daily.      Marland Kitchen aspirin 81 MG tablet Take 81 mg by mouth 2 (two) times daily.       Marland Kitchen atorvastatin (LIPITOR) 20 MG tablet Take 20 mg by mouth daily.        Marland Kitchen ibuprofen (ADVIL,MOTRIN) 800 MG tablet Take 800 mg by mouth every 8 (eight) hours as needed.      . insulin detemir (LEVEMIR) 100 UNIT/ML injection Inject 60 Units into the skin 2 (two) times daily.       Marland Kitchen JANUVIA 100 MG tablet Take 1 tablet by mouth daily.      Marland Kitchen loratadine (CLARITIN) 10 MG tablet Take 10 mg by mouth daily.        Marland Kitchen losartan (COZAAR) 100 MG tablet Take 100 mg by mouth daily.        . metFORMIN (GLUCOPHAGE) 500 MG tablet Take 1,000 mg by mouth 2 (two) times daily with a meal.       . mometasone (NASONEX) 50 MCG/ACT nasal spray Place 2 sprays into the nose daily.        . pantoprazole (PROTONIX) 40 MG tablet Take 40 mg by mouth daily.        . traMADol (ULTRAM) 50 MG tablet Take 50 mg by mouth every 6 (six) hours as needed.        . vitamin D, CHOLECALCIFEROL, 400 UNITS tablet Take 400 Units by mouth daily.        Marland Kitchen DISCONTD: fluticasone (FLONASE) 50 MCG/ACT nasal spray Place 2 sprays into the nose daily.          Allergies  Allergen Reactions  . Lisinopril  Cough    Past Medical History  Diagnosis Date  . Diabetes mellitus   . Sleep apnea   . HTN (hypertension)   . Obesity   . Asthma   . Sciatica   . Rotator cuff tear   . Disorder of vocal cord     ROS: Negative except as per HPI  BP 128/70  Pulse 76  Ht 5\' 6"  (1.676 m)  Wt 113.399 kg (250 lb)  BMI 40.35 kg/m2  PHYSICAL EXAM: Pt is alert and oriented, pleasant, obese woman in NAD HEENT: normal Neck: JVP - normal Lungs: CTA bilaterally CV: RRR without murmur or gallop Abd: soft, NT, Positive BS Ext: no C/C/E, distal pulses intact and equal Skin: warm/dry no rash  ASSESSMENT AND PLAN:

## 2011-09-17 NOTE — Assessment & Plan Note (Signed)
The patient had an equivocal, albeit low risk stress Myoview study. I ordered a coronary CT angiogram but it was denied by the insurance agency. I did appear review with the insurance physician and he still would not approve this. Our options are essentially medical therapy versus invasive cardiac catheterization. I think medical therapy is indicated as I suspect the nuclear scan result was due to artifact rather than a true abnormality. The patient's chest pain is atypical and has improved since I have last seen her. I would recommend continued primary prevention measures. The patient's medical program is good and includes appropriate therapies. I reviewed all of the issues with the insurance denial of the CT scan after she left the office. She understands this and agrees with medical therapy. She will contact me if chest pain recurs or worsens.

## 2011-09-20 NOTE — Assessment & Plan Note (Signed)
Nasal congestion is interfering with CPAP use. Cardiac status is being evaluated because of her chest pain. She is stable enough now to reschedule allergy skin testing. Plan-restart Nasonex. Use OTC antihistamine as needed. Schedule allergy skin testing.

## 2011-09-20 NOTE — Assessment & Plan Note (Signed)
Discussed cautious use of nasal decongestant spray if necessary to permit CPAP use.

## 2011-09-20 NOTE — Assessment & Plan Note (Signed)
Obstructive airways disease limited to small airways. Spirometry results prevented methacholine study.

## 2011-09-24 ENCOUNTER — Encounter: Payer: Self-pay | Admitting: Internal Medicine

## 2011-10-28 ENCOUNTER — Encounter: Payer: Self-pay | Admitting: Internal Medicine

## 2011-10-28 ENCOUNTER — Ambulatory Visit (INDEPENDENT_AMBULATORY_CARE_PROVIDER_SITE_OTHER): Payer: Medicare Other | Admitting: Internal Medicine

## 2011-10-28 VITALS — BP 122/70 | HR 74 | Ht 67.0 in | Wt 251.2 lb

## 2011-10-28 DIAGNOSIS — J309 Allergic rhinitis, unspecified: Secondary | ICD-10-CM

## 2011-10-28 DIAGNOSIS — G4733 Obstructive sleep apnea (adult) (pediatric): Secondary | ICD-10-CM

## 2011-10-28 DIAGNOSIS — J45909 Unspecified asthma, uncomplicated: Secondary | ICD-10-CM

## 2011-10-28 NOTE — Patient Instructions (Addendum)
Order- DME [facility]- replacement old, broken CPAP machine,  11 cwp, replacement mask of choice- needs re-fit. Humidifier and supplies.  Dx OSA  Ok to continue using antihistamines and Nasonex for your allergies. Let me know if this isn't working well enough.

## 2011-10-28 NOTE — Progress Notes (Signed)
08/06/11- 75 yoF former smoker referred by Dr Chase Caller who has been seeing her for multifactorial cough, questioning an allergic component. Married, living w/ husand and daughter, training in criminal justice at [employer]. She gives a history of perennial rhinitis, asthma and bronchitis starting many years ago as an adult. Triggers include temperature changes, seasonal changes, in and out of doors. Nasal congestion and postnasal drainage are worst in spring and fall, with house dust and around cigarette smoke. History of chronic bronchitis. Had sinus surgery. Obstructive sleep apnea on CPAP/[facility]. She snores through the mask. Does not have a managing physician. Nasal congestion at night makes this worse. Family history positive for allergies and asthma with little detail recalled.  08/19/11- 36 yoF former smoker referred by Dr Chase Caller who has been seeing her for multifactorial cough, questioning an allergic component She came today as scheduled for allergy skin testing. On arrival she complained to the nurse of substernal pain with soreness in her left upper anterior chest or shoulder. Pulse was a little irregular and she described malaise. The nurse had her lie down on the exam table and described the patient as "glassy eyed", relieved by sitting her back up. EKG: Sinus rhythm 71 per minute with PACs poor R wave progression. Nonspecific ST- T abnormality with question of very slight ST segment elevation especially in the inferolateral leads.  The patient described significant heartburn all weekend, with a lot of gas. She took protonix and said it helped almost immediately. She also gave history of left shoulder arthritis. She reported starting to wheeze almost on arrival at  our office.  09/16/11- 34 yoF former smoker referred by Dr Chase Caller who has been seeing her for multifactorial cough, questioning an allergic component At last visit she had presented with chest pain, preventing planned  allergy skin testing. Now following with cardiology and pending CT scan of her heart/Dr. Burt Knack. Still complains of nasal congestion. Able to use her CPAP more regularly when her nose is not stopped up. Unable to do methacholine challenge last month because her baseline is from a tree scores were too low. CPAP autotitration done/[facility]. Pending download. PFT- 09/11/11- FVC 2.15/68%, FEV1 1.59/ 64%, FEV1/FVC 0.80. FEF 25-75 % 0.37/ 15%  10/28/11- 43 yoF former smoker referred by Dr Chase Caller who has been seeing her for multifactorial cough, questioning an allergic component Comes today for allergy skin testing. Allergy profile 1/24 13-total IgE 34.6 with only a single allergen elevated. We also reviewed her sleep apnea status. CPAP download indicates good control of 11 CWP. She was sleeping through the night until the machine broke. Needs replacement. Allergy skin tests: Significant positive for grass weed and tree pollens, some molds.  ROS-see HPI Constitutional:   No-   weight loss, night sweats, fevers, chills, fatigue, lassitude. HEENT:   No-  headaches, difficulty swallowing, tooth/dental problems, sore throat,       + sneezing, itching, ear ache, nasal congestion, post nasal drip,  CV:  +  chest pain, orthopnea, PND,  No-swelling in lower extremities, anasarca, dizziness, palpitations Resp: No-   shortness of breath with exertion or at rest.             No- productive cough,  + non-productive cough,  No- coughing up of blood.              No-   change in color of mucus. +recent wheezing.   Skin: No-   rash or lesions. GI:   GU:  MS:  No-  joint pain or swelling.   Neuro-     nothing unusual Psych:  No- change in mood or affect. No depression or anxiety.  No memory loss.  OBJ General- Alert, Oriented, Affect-appropriate, Distress- none acute, obese Skin- rash-none, lesions- none, excoriation- none. Not diaphoretic Lymphadenopathy- none Head- atraumatic            Eyes- Gross vision  intact, PERRLA, conjunctivae clear secretions            Ears- Hearing, canals-normal            Nose- +Turbinate edema, +sniffing, no-Septal dev, mucus, polyps, erosion, perforation             Throat- Mallampati IV , mucosa clear , drainage- none, tonsils- atrophic. . Neck- flexible , trachea midline, no stridor , thyroid nl, carotid no bruit Chest - symmetrical excursion , unlabored           Heart/CV- RRR , no murmur , no gallop  , no rub, nl s1 s2                           - JVD- none , edema- none, stasis changes- none, varices- none           Lung- clear, +cough- raspy throat clearing , dullness-none, rub- none           Chest wall-  Abd-  Br/ Gen/ Rectal- Not done, not indicated Extrem- cyanosis- none, clubbing, none, atrophy- none, strength- nl Neuro- grossly intact to observation

## 2011-11-01 NOTE — Assessment & Plan Note (Signed)
Environmental precautions emphasized and dust and pollen exposures. Written information given. She feels comfortable enough for now with antihistamines and Nasonex. Allergy vaccine could be an option later if necessary.

## 2011-11-01 NOTE — Assessment & Plan Note (Signed)
She feels her medications are sufficient for now. Allergy vaccine or possibly Xolair could be considered later if necessary.

## 2011-11-01 NOTE — Assessment & Plan Note (Signed)
She is motivated to good compliance and control. Needs replacement for broken machine and a refit of her mask.

## 2011-11-05 ENCOUNTER — Encounter: Payer: Self-pay | Admitting: Internal Medicine

## 2011-11-10 ENCOUNTER — Ambulatory Visit: Payer: Medicare Other | Attending: Orthopedic Surgery

## 2011-11-10 DIAGNOSIS — IMO0001 Reserved for inherently not codable concepts without codable children: Secondary | ICD-10-CM | POA: Insufficient documentation

## 2011-11-10 DIAGNOSIS — M25619 Stiffness of unspecified shoulder, not elsewhere classified: Secondary | ICD-10-CM | POA: Insufficient documentation

## 2011-11-10 DIAGNOSIS — M25519 Pain in unspecified shoulder: Secondary | ICD-10-CM | POA: Insufficient documentation

## 2011-11-18 ENCOUNTER — Ambulatory Visit: Payer: Medicare Other | Attending: Orthopedic Surgery

## 2011-11-18 DIAGNOSIS — M25519 Pain in unspecified shoulder: Secondary | ICD-10-CM | POA: Insufficient documentation

## 2011-11-18 DIAGNOSIS — M25619 Stiffness of unspecified shoulder, not elsewhere classified: Secondary | ICD-10-CM | POA: Insufficient documentation

## 2011-11-18 DIAGNOSIS — IMO0001 Reserved for inherently not codable concepts without codable children: Secondary | ICD-10-CM | POA: Insufficient documentation

## 2011-12-01 ENCOUNTER — Telehealth: Payer: Self-pay | Admitting: Internal Medicine

## 2011-12-01 DIAGNOSIS — G4733 Obstructive sleep apnea (adult) (pediatric): Secondary | ICD-10-CM

## 2011-12-01 NOTE — Telephone Encounter (Signed)
Order was placed on 10/28/11 for pt to get new cpap machine but it looks like this was not sent over to [facility]. Please advise PCC's, thanks

## 2011-12-01 NOTE — Telephone Encounter (Signed)
I have corrected the order and sent to [facility].

## 2011-12-01 NOTE — Telephone Encounter (Signed)
Pt was seen on an Allergy Day. Therefore, the referral for DME for CPAP went under the Allergy Department Referral. This referral did not come to our workque. Katie re-entered while she was signed in to the Pulmonary Dept and resent the referral. Referral for Replacement of CPAP sent on 11 cwp, replacement mask of choice, needs re-fit, humidifier and supplies. Asked [facility] to contact patient ASAP.

## 2012-01-15 ENCOUNTER — Encounter (HOSPITAL_COMMUNITY): Payer: Self-pay | Admitting: Pharmacy Technician

## 2012-01-20 NOTE — Pre-Procedure Instructions (Signed)
20 [name]  01/20/2012   Your procedure is scheduled on:  Thursday July 18  Report to [facility] at 8:00 AM.  Call this number if you have problems the morning of surgery: [phone]   Remember:   Do not eat food:After Midnight.  May have clear liquids:until Midnight .  Clear liquids include soda, tea, black coffee, apple or grape juice, broth.  Take these medicines the morning of surgery with A SIP OF WATER: Xanax, Claritin, Protonix. May take Tramadol if needed. May use Albuterol inhaler, bring to surgery. May use nasal spray.   Do not wear jewelry, make-up or nail polish.  Do not wear lotions, powders, or perfumes. You may wear deodorant.  Do not shave 48 hours prior to surgery. Men may shave face and neck.  Do not bring valuables to the hospital.  Contacts, dentures or bridgework may not be worn into surgery.  Leave suitcase in the car. After surgery it may be brought to your room.  For patients admitted to the hospital, checkout time is 11:00 AM the day of discharge.   Patients discharged the day of surgery will not be allowed to drive home.  Name and phone number of your driver: NA  Special Instructions: Incentive Spirometry - Practice and bring it with you on the day of surgery. and CHG Shower Use Special Wash: 1/2 bottle night before surgery and 1/2 bottle morning of surgery.   Please read over the following fact sheets that you were given: Pain Booklet, Coughing and Deep Breathing and Surgical Site Infection Prevention

## 2012-01-21 ENCOUNTER — Encounter (HOSPITAL_COMMUNITY)
Admission: RE | Admit: 2012-01-21 | Discharge: 2012-01-21 | Disposition: A | Discharge status: Home / Self Care | Payer: Medicare Other | Source: Ambulatory Visit | Attending: Orthopedic Surgery | Admitting: Orthopedic Surgery

## 2012-01-21 ENCOUNTER — Encounter (HOSPITAL_COMMUNITY): Payer: Self-pay

## 2012-01-21 ENCOUNTER — Ambulatory Visit (HOSPITAL_COMMUNITY)
Admission: RE | Admit: 2012-01-21 | Discharge: 2012-01-21 | Disposition: A | Discharge status: Home / Self Care | Payer: Medicare Other | Source: Ambulatory Visit | Attending: Anesthesiology | Admitting: Anesthesiology

## 2012-01-21 DIAGNOSIS — Z01812 Encounter for preprocedural laboratory examination: Secondary | ICD-10-CM | POA: Insufficient documentation

## 2012-01-21 DIAGNOSIS — S43429A Sprain of unspecified rotator cuff capsule, initial encounter: Secondary | ICD-10-CM | POA: Insufficient documentation

## 2012-01-21 DIAGNOSIS — Z01811 Encounter for preprocedural respiratory examination: Secondary | ICD-10-CM | POA: Insufficient documentation

## 2012-01-21 DIAGNOSIS — X58XXXA Exposure to other specified factors, initial encounter: Secondary | ICD-10-CM | POA: Insufficient documentation

## 2012-01-21 HISTORY — DX: Unspecified osteoarthritis, unspecified site: M19.90

## 2012-01-21 HISTORY — DX: Headache: R51

## 2012-01-21 HISTORY — DX: Anxiety disorder, unspecified: F41.9

## 2012-01-21 HISTORY — DX: Chronic obstructive pulmonary disease, unspecified: J44.9

## 2012-01-21 LAB — BASIC METABOLIC PANEL
BUN: 10 mg/dL (ref 6–23)
CO2: 31 mEq/L (ref 19–32)
Calcium: 9.7 mg/dL (ref 8.4–10.5)
Chloride: 99 mEq/L (ref 96–112)
Creatinine, Ser: 0.83 mg/dL (ref 0.50–1.10)
GFR calc Af Amer: 90 mL/min — ABNORMAL LOW (ref >90)
GFR calc non Af Amer: 77 mL/min — ABNORMAL LOW (ref >90)
Glucose, Bld: 200 mg/dL — ABNORMAL HIGH (ref 70–99)
Potassium: 4.3 mEq/L (ref 3.5–5.1)
Sodium: 138 mEq/L (ref 135–145)

## 2012-01-21 LAB — CBC
HCT: 42 % (ref 36.0–46.0)
Hemoglobin: 13.6 g/dL (ref 12.0–15.0)
MCH: 26.9 pg (ref 26.0–34.0)
MCHC: 32.4 g/dL (ref 30.0–36.0)
MCV: 83.2 fL (ref 78.0–100.0)
Platelets: 251 10*3/uL (ref 150–400)
RBC: 5.05 MIL/uL (ref 3.87–5.11)
RDW: 14 % (ref 11.5–15.5)
WBC: 7.8 10*3/uL (ref 4.0–10.5)

## 2012-01-21 LAB — SURGICAL PCR SCREEN
MRSA, PCR: NEGATIVE
Staphylococcus aureus: NEGATIVE

## 2012-01-21 NOTE — Progress Notes (Signed)
Pt has not been using CPAP, machine is broken and she is in the process of getting a new one.   Pt cardiologist Dr. Excell Seltzer.

## 2012-01-22 NOTE — Consult Note (Signed)
Anesthesiology Note:  55 year old female with obesity, severe sleep apnea, and hypertension for L. Shoulder arthroscopy and RTC repair 01/28/12. Pt had a low risk  myoview study 08/26/11 with a small partially reversible anterior defect suggesting breast attenuation, EF 72%. Cardiac CT refused by her insurance company. Dr. Excell Seltzer elected not to do a cardiac cath. I spoke with pt per telephone,  She claims she is in her usual state of health, uses CPAP intermitantly., able to climb a flight of stairs and perform usual ADLs without difficulty  Will evaluate pt on day of surgery will need CPAP post-op.  Kipp Brood.

## 2012-01-27 ENCOUNTER — Ambulatory Visit: Payer: Medicare Other | Admitting: Internal Medicine

## 2012-01-27 MED ORDER — CEFAZOLIN SODIUM-DEXTROSE 2-3 GM-% IV SOLR
2.0000 g | INTRAVENOUS | Status: AC
  Administered 2012-01-28: 2 g via INTRAVENOUS
  Filled 2012-01-27: qty 50

## 2012-01-28 ENCOUNTER — Encounter (HOSPITAL_COMMUNITY): Payer: Self-pay | Admitting: *Deleted

## 2012-01-28 ENCOUNTER — Encounter (HOSPITAL_COMMUNITY): Payer: Self-pay | Admitting: Anesthesiology

## 2012-01-28 ENCOUNTER — Ambulatory Visit (HOSPITAL_COMMUNITY): Payer: Medicare Other | Admitting: Anesthesiology

## 2012-01-28 ENCOUNTER — Encounter (HOSPITAL_COMMUNITY)
Admission: RE | Disposition: A | Discharge status: Home / Self Care | Payer: Self-pay | Source: Ambulatory Visit | Attending: Orthopedic Surgery

## 2012-01-28 ENCOUNTER — Observation Stay (HOSPITAL_COMMUNITY)
Admission: RE | Admit: 2012-01-28 | Discharge: 2012-01-29 | DRG: 497 | Disposition: A | Discharge status: Home / Self Care | Payer: Medicare Other | Source: Ambulatory Visit | Attending: Orthopedic Surgery | Admitting: Orthopedic Surgery

## 2012-01-28 DIAGNOSIS — M25819 Other specified joint disorders, unspecified shoulder: Principal | ICD-10-CM | POA: Insufficient documentation

## 2012-01-28 DIAGNOSIS — E7800 Pure hypercholesterolemia, unspecified: Secondary | ICD-10-CM | POA: Insufficient documentation

## 2012-01-28 DIAGNOSIS — M24119 Other articular cartilage disorders, unspecified shoulder: Secondary | ICD-10-CM | POA: Insufficient documentation

## 2012-01-28 DIAGNOSIS — E669 Obesity, unspecified: Secondary | ICD-10-CM | POA: Insufficient documentation

## 2012-01-28 DIAGNOSIS — J449 Chronic obstructive pulmonary disease, unspecified: Secondary | ICD-10-CM | POA: Insufficient documentation

## 2012-01-28 DIAGNOSIS — M75100 Unspecified rotator cuff tear or rupture of unspecified shoulder, not specified as traumatic: Secondary | ICD-10-CM

## 2012-01-28 DIAGNOSIS — Z9889 Other specified postprocedural states: Secondary | ICD-10-CM

## 2012-01-28 DIAGNOSIS — Z794 Long term (current) use of insulin: Secondary | ICD-10-CM | POA: Insufficient documentation

## 2012-01-28 DIAGNOSIS — I10 Essential (primary) hypertension: Secondary | ICD-10-CM | POA: Insufficient documentation

## 2012-01-28 DIAGNOSIS — M719 Bursopathy, unspecified: Secondary | ICD-10-CM | POA: Insufficient documentation

## 2012-01-28 DIAGNOSIS — M5430 Sciatica, unspecified side: Secondary | ICD-10-CM | POA: Insufficient documentation

## 2012-01-28 DIAGNOSIS — Z79899 Other long term (current) drug therapy: Secondary | ICD-10-CM | POA: Insufficient documentation

## 2012-01-28 DIAGNOSIS — M67919 Unspecified disorder of synovium and tendon, unspecified shoulder: Secondary | ICD-10-CM | POA: Insufficient documentation

## 2012-01-28 DIAGNOSIS — G4730 Sleep apnea, unspecified: Secondary | ICD-10-CM | POA: Insufficient documentation

## 2012-01-28 DIAGNOSIS — M19019 Primary osteoarthritis, unspecified shoulder: Secondary | ICD-10-CM | POA: Insufficient documentation

## 2012-01-28 DIAGNOSIS — E119 Type 2 diabetes mellitus without complications: Secondary | ICD-10-CM | POA: Insufficient documentation

## 2012-01-28 DIAGNOSIS — K219 Gastro-esophageal reflux disease without esophagitis: Secondary | ICD-10-CM | POA: Insufficient documentation

## 2012-01-28 DIAGNOSIS — J4489 Other specified chronic obstructive pulmonary disease: Secondary | ICD-10-CM | POA: Insufficient documentation

## 2012-01-28 HISTORY — DX: Type 2 diabetes mellitus without complications: E11.9

## 2012-01-28 HISTORY — DX: Pure hypercholesterolemia, unspecified: E78.00

## 2012-01-28 HISTORY — DX: Other allergy status, other than to drugs and biological substances: Z91.09

## 2012-01-28 HISTORY — DX: Cardiac murmur, unspecified: R01.1

## 2012-01-28 HISTORY — PX: SHOULDER ARTHROSCOPY W/ ROTATOR CUFF REPAIR: SHX2400

## 2012-01-28 HISTORY — DX: Sciatica, unspecified side: M54.30

## 2012-01-28 HISTORY — DX: Other diseases of vocal cords: J38.3

## 2012-01-28 HISTORY — DX: Unspecified disturbances of skin sensation: R20.9

## 2012-01-28 HISTORY — DX: Dyspnea, unspecified: R06.00

## 2012-01-28 HISTORY — DX: Gastro-esophageal reflux disease without esophagitis: K21.9

## 2012-01-28 HISTORY — DX: Other forms of dyspnea: R06.09

## 2012-01-28 HISTORY — DX: Unspecified chronic bronchitis: J42

## 2012-01-28 LAB — GLUCOSE, CAPILLARY
Glucose-Capillary: 105 mg/dL — ABNORMAL HIGH (ref 70–99)
Glucose-Capillary: 95 mg/dL (ref 70–99)
Glucose-Capillary: 120 mg/dL — ABNORMAL HIGH (ref 70–99)
Glucose-Capillary: 185 mg/dL — ABNORMAL HIGH (ref 70–99)

## 2012-01-28 SURGERY — SHOULDER ARTHROSCOPY WITH ROTATOR CUFF REPAIR AND SUBACROMIAL DECOMPRESSION
Anesthesia: General | Site: Shoulder | Laterality: Left | Wound class: Clean

## 2012-01-28 MED ORDER — MIDAZOLAM HCL 5 MG/5ML IJ SOLN
INTRAMUSCULAR | Status: DC | PRN
  Administered 2012-01-28: 2 mg via INTRAVENOUS

## 2012-01-28 MED ORDER — ALBUTEROL SULFATE HFA 108 (90 BASE) MCG/ACT IN AERS
2.0000 | INHALATION_SPRAY | Freq: Four times a day (QID) | RESPIRATORY_TRACT | Status: DC | PRN
  Administered 2012-01-28: 2 via RESPIRATORY_TRACT
  Filled 2012-01-28 (×2): qty 6.7

## 2012-01-28 MED ORDER — HYDROMORPHONE HCL PF 1 MG/ML IJ SOLN: 0.2500 mg | INTRAMUSCULAR | Status: DC | PRN

## 2012-01-28 MED ORDER — ONDANSETRON HCL 4 MG PO TABS: 4.0000 mg | ORAL_TABLET | Freq: Four times a day (QID) | ORAL | Status: DC | PRN

## 2012-01-28 MED ORDER — LIDOCAINE HCL 4 % MT SOLN
OROMUCOSAL | Status: DC | PRN
  Administered 2012-01-28: 4 mL via TOPICAL

## 2012-01-28 MED ORDER — LACTATED RINGERS IV SOLN
INTRAVENOUS | Status: DC
  Administered 2012-01-28 – 2012-01-29 (×2): via INTRAVENOUS

## 2012-01-28 MED ORDER — METOCLOPRAMIDE HCL 5 MG/ML IJ SOLN
5.0000 mg | Freq: Three times a day (TID) | INTRAMUSCULAR | Status: DC | PRN
  Filled 2012-01-28: qty 2

## 2012-01-28 MED ORDER — OXYCODONE-ACETAMINOPHEN 5-325 MG PO TABS
1.0000 | ORAL_TABLET | ORAL | Status: DC | PRN
  Administered 2012-01-28: 2 via ORAL
  Filled 2012-01-28: qty 2

## 2012-01-28 MED ORDER — ATORVASTATIN CALCIUM 20 MG PO TABS
20.0000 mg | ORAL_TABLET | Freq: Every day | ORAL | Status: DC
  Administered 2012-01-28: 20 mg via ORAL
  Filled 2012-01-28 (×2): qty 1

## 2012-01-28 MED ORDER — KETOROLAC TROMETHAMINE 30 MG/ML IJ SOLN
30.0000 mg | Freq: Four times a day (QID) | INTRAMUSCULAR | Status: DC
  Administered 2012-01-29 (×2): 30 mg via INTRAVENOUS
  Filled 2012-01-28 (×6): qty 1

## 2012-01-28 MED ORDER — NEOSTIGMINE METHYLSULFATE 1 MG/ML IJ SOLN
INTRAMUSCULAR | Status: DC | PRN
  Administered 2012-01-28: 2 mg via INTRAVENOUS

## 2012-01-28 MED ORDER — HYDROMORPHONE HCL PF 1 MG/ML IJ SOLN: 0.5000 mg | INTRAMUSCULAR | Status: DC | PRN

## 2012-01-28 MED ORDER — LOSARTAN POTASSIUM 50 MG PO TABS
100.0000 mg | ORAL_TABLET | Freq: Every day | ORAL | Status: DC
  Administered 2012-01-29: 100 mg via ORAL
  Filled 2012-01-28: qty 2

## 2012-01-28 MED ORDER — BUPIVACAINE-EPINEPHRINE PF 0.5-1:200000 % IJ SOLN
INTRAMUSCULAR | Status: DC | PRN
  Administered 2012-01-28: 20 mL

## 2012-01-28 MED ORDER — FENTANYL CITRATE 0.05 MG/ML IJ SOLN
INTRAMUSCULAR | Status: DC | PRN
  Administered 2012-01-28 (×2): 50 ug via INTRAVENOUS

## 2012-01-28 MED ORDER — LACTATED RINGERS IV SOLN
INTRAVENOUS | Status: DC
  Administered 2012-01-28: 50 mL/h via INTRAVENOUS

## 2012-01-28 MED ORDER — ROCURONIUM BROMIDE 100 MG/10ML IV SOLN
INTRAVENOUS | Status: DC | PRN
  Administered 2012-01-28: 50 mg via INTRAVENOUS

## 2012-01-28 MED ORDER — METHOCARBAMOL 100 MG/ML IJ SOLN
500.0000 mg | Freq: Four times a day (QID) | INTRAVENOUS | Status: DC | PRN
  Filled 2012-01-28: qty 5

## 2012-01-28 MED ORDER — LIDOCAINE HCL (CARDIAC) 20 MG/ML IV SOLN
INTRAVENOUS | Status: DC | PRN
  Administered 2012-01-28: 100 mg via INTRAVENOUS

## 2012-01-28 MED ORDER — PHENYLEPHRINE HCL 10 MG/ML IJ SOLN
10.0000 mg | INTRAVENOUS | Status: DC | PRN
  Administered 2012-01-28: 25 ug/min via INTRAVENOUS

## 2012-01-28 MED ORDER — ALPRAZOLAM 0.5 MG PO TABS
0.5000 mg | ORAL_TABLET | Freq: Two times a day (BID) | ORAL | Status: DC
  Administered 2012-01-28 – 2012-01-29 (×2): 0.5 mg via ORAL
  Filled 2012-01-28 (×2): qty 1

## 2012-01-28 MED ORDER — ACETAMINOPHEN 325 MG PO TABS: 650.0000 mg | ORAL_TABLET | Freq: Four times a day (QID) | ORAL | Status: DC | PRN

## 2012-01-28 MED ORDER — CEFAZOLIN SODIUM 1-5 GM-% IV SOLN
1.0000 g | Freq: Four times a day (QID) | INTRAVENOUS | Status: AC
  Administered 2012-01-28 – 2012-01-29 (×3): 1 g via INTRAVENOUS
  Filled 2012-01-28 (×3): qty 50

## 2012-01-28 MED ORDER — PANTOPRAZOLE SODIUM 40 MG PO TBEC: 40.0000 mg | DELAYED_RELEASE_TABLET | Freq: Every day | ORAL | Status: DC

## 2012-01-28 MED ORDER — LORATADINE 10 MG PO TABS
10.0000 mg | ORAL_TABLET | Freq: Every day | ORAL | Status: DC
Start: 2012-01-29 — End: 2012-01-29
  Administered 2012-01-29: 10 mg via ORAL
  Filled 2012-01-28: qty 1

## 2012-01-28 MED ORDER — MENTHOL 3 MG MT LOZG: 1.0000 | LOZENGE | OROMUCOSAL | Status: DC | PRN

## 2012-01-28 MED ORDER — ACETAMINOPHEN 650 MG RE SUPP: 650.0000 mg | Freq: Four times a day (QID) | RECTAL | Status: DC | PRN

## 2012-01-28 MED ORDER — FENTANYL CITRATE 0.05 MG/ML IJ SOLN
50.0000 ug | INTRAMUSCULAR | Status: DC | PRN
  Administered 2012-01-28: 100 ug via INTRAVENOUS

## 2012-01-28 MED ORDER — SODIUM CHLORIDE 0.9 % IR SOLN
Status: DC | PRN
  Administered 2012-01-28: 10000 mL

## 2012-01-28 MED ORDER — ONDANSETRON HCL 4 MG/2ML IJ SOLN: 4.0000 mg | Freq: Four times a day (QID) | INTRAMUSCULAR | Status: DC | PRN

## 2012-01-28 MED ORDER — PHENOL 1.4 % MT LIQD
1.0000 | OROMUCOSAL | Status: DC | PRN
  Administered 2012-01-29: 1 via OROMUCOSAL
  Filled 2012-01-28: qty 177

## 2012-01-28 MED ORDER — LINAGLIPTIN 5 MG PO TABS
5.0000 mg | ORAL_TABLET | Freq: Every day | ORAL | Status: DC
  Administered 2012-01-29: 5 mg via ORAL
  Filled 2012-01-28 (×2): qty 1

## 2012-01-28 MED ORDER — PHENYLEPHRINE HCL 10 MG/ML IJ SOLN
INTRAMUSCULAR | Status: DC | PRN
  Administered 2012-01-28: 80 ug via INTRAVENOUS
  Administered 2012-01-28: 120 ug via INTRAVENOUS
  Administered 2012-01-28: 80 ug via INTRAVENOUS

## 2012-01-28 MED ORDER — INSULIN ASPART 100 UNIT/ML ~~LOC~~ SOLN
0.0000 [IU] | Freq: Three times a day (TID) | SUBCUTANEOUS | Status: DC
  Administered 2012-01-29: 3 [IU] via SUBCUTANEOUS

## 2012-01-28 MED ORDER — FENTANYL CITRATE 0.05 MG/ML IJ SOLN
INTRAMUSCULAR | Status: AC
  Filled 2012-01-28: qty 2

## 2012-01-28 MED ORDER — INSULIN DETEMIR 100 UNIT/ML ~~LOC~~ SOLN
60.0000 [IU] | Freq: Two times a day (BID) | SUBCUTANEOUS | Status: DC
  Administered 2012-01-28 – 2012-01-29 (×2): 60 [IU] via SUBCUTANEOUS
  Filled 2012-01-28: qty 10

## 2012-01-28 MED ORDER — ONDANSETRON HCL 4 MG/2ML IJ SOLN
INTRAMUSCULAR | Status: DC | PRN
  Administered 2012-01-28: 4 mg via INTRAVENOUS

## 2012-01-28 MED ORDER — GLYCOPYRROLATE 0.2 MG/ML IJ SOLN
INTRAMUSCULAR | Status: DC | PRN
  Administered 2012-01-28: 0.1 mg via INTRAVENOUS
  Administered 2012-01-28: 0.3 mg via INTRAVENOUS
  Administered 2012-01-28: 0.1 mg via INTRAVENOUS

## 2012-01-28 MED ORDER — PROPOFOL 10 MG/ML IV EMUL
INTRAVENOUS | Status: DC | PRN
  Administered 2012-01-28: 200 mg via INTRAVENOUS

## 2012-01-28 MED ORDER — METOCLOPRAMIDE HCL 10 MG PO TABS: 5.0000 mg | ORAL_TABLET | Freq: Three times a day (TID) | ORAL | Status: DC | PRN

## 2012-01-28 MED ORDER — ONDANSETRON HCL 4 MG/2ML IJ SOLN: 4.0000 mg | Freq: Once | INTRAMUSCULAR | Status: DC | PRN

## 2012-01-28 MED ORDER — CHLORHEXIDINE GLUCONATE 4 % EX LIQD: 60.0000 mL | Freq: Once | CUTANEOUS | Status: DC

## 2012-01-28 MED ORDER — METHOCARBAMOL 500 MG PO TABS
500.0000 mg | ORAL_TABLET | Freq: Four times a day (QID) | ORAL | Status: DC | PRN
  Filled 2012-01-28: qty 1

## 2012-01-28 SURGICAL SUPPLY — 63 items
ANCHOR CORKSCREW FIBER 5.5X15 (Anchor) ×2 IMPLANT
BLADE CUTTER GATOR 3.5 (BLADE) ×2 IMPLANT
BLADE GREAT WHITE 4.2 (BLADE) ×2 IMPLANT
BLADE SURG 11 STRL SS (BLADE) ×2 IMPLANT
BOOTCOVER CLEANROOM LRG (PROTECTIVE WEAR) ×2 IMPLANT
BUR 3.5 LG SPHERICAL (BURR) IMPLANT
BUR OVAL 4.0 (BURR) ×2 IMPLANT
BURR 3.5 LG SPHERICAL (BURR)
CANISTER SUCT LVC 12 LTR MEDI- (MISCELLANEOUS) ×2 IMPLANT
CANNULA ACUFLEX KIT 5X76 (CANNULA) ×2 IMPLANT
CANNULA DRILOCK 5.0X75 (CANNULA) ×6 IMPLANT
CLOTH BEACON ORANGE TIMEOUT ST (SAFETY) ×2 IMPLANT
CLSR STERI-STRIP ANTIMIC 1/2X4 (GAUZE/BANDAGES/DRESSINGS) ×2 IMPLANT
CONNECTOR 5 IN 1 STRAIGHT STRL (MISCELLANEOUS) ×2 IMPLANT
DRAPE INCISE 23X17 IOBAN STRL (DRAPES) ×1
DRAPE INCISE IOBAN 23X17 STRL (DRAPES) ×1 IMPLANT
DRAPE STERI 35X30 U-POUCH (DRAPES) ×2 IMPLANT
DRAPE SURG 17X11 SM STRL (DRAPES) ×2 IMPLANT
DRAPE U-SHAPE 47X51 STRL (DRAPES) ×2 IMPLANT
DRSG PAD ABDOMINAL 8X10 ST (GAUZE/BANDAGES/DRESSINGS) IMPLANT
DURAPREP 26ML APPLICATOR (WOUND CARE) ×2 IMPLANT
ELECT REM PT RETURN 9FT ADLT (ELECTROSURGICAL) ×2
ELECTRODE REM PT RTRN 9FT ADLT (ELECTROSURGICAL) ×1 IMPLANT
GLOVE BIO SURGEON STRL SZ7.5 (GLOVE) ×2 IMPLANT
GLOVE BIO SURGEON STRL SZ8 (GLOVE) ×2 IMPLANT
GLOVE BIO SURGEON STRL SZ8.5 (GLOVE) ×4 IMPLANT
GLOVE BIOGEL PI IND STRL 6.5 (GLOVE) ×1 IMPLANT
GLOVE BIOGEL PI INDICATOR 6.5 (GLOVE) ×1
GLOVE ECLIPSE 6.5 STRL STRAW (GLOVE) ×2 IMPLANT
GLOVE EUDERMIC 7 POWDERFREE (GLOVE) ×4 IMPLANT
GLOVE SS BIOGEL STRL SZ 7.5 (GLOVE) ×1 IMPLANT
GLOVE SUPERSENSE BIOGEL SZ 7.5 (GLOVE) ×1
GLOVE SURG SS PI 8.5 STRL IVOR (GLOVE) ×2
GLOVE SURG SS PI 8.5 STRL STRW (GLOVE) ×2 IMPLANT
GOWN STRL NON-REIN LRG LVL3 (GOWN DISPOSABLE) ×2 IMPLANT
GOWN STRL REIN XL XLG (GOWN DISPOSABLE) ×8 IMPLANT
KIT BASIN OR (CUSTOM PROCEDURE TRAY) ×2 IMPLANT
KIT ROOM TURNOVER OR (KITS) ×2 IMPLANT
KIT SHOULDER TRACTION (DRAPES) ×2 IMPLANT
MANIFOLD NEPTUNE II (INSTRUMENTS) ×2 IMPLANT
NDL SUT 6 .5 CRC .975X.05 MAYO (NEEDLE) IMPLANT
NEEDLE MAYO TAPER (NEEDLE)
NEEDLE SPNL 18GX3.5 QUINCKE PK (NEEDLE) ×2 IMPLANT
NS IRRIG 1000ML POUR BTL (IV SOLUTION) ×2 IMPLANT
PACK SHOULDER (CUSTOM PROCEDURE TRAY) ×2 IMPLANT
PAD ARMBOARD 7.5X6 YLW CONV (MISCELLANEOUS) ×4 IMPLANT
PUSHLOCK PEEK 4.5X24 (Orthopedic Implant) ×4 IMPLANT
SET ARTHROSCOPY TUBING (MISCELLANEOUS) ×2
SET ARTHROSCOPY TUBING LN (MISCELLANEOUS) ×1 IMPLANT
SLING ARM IMMOBILIZER LRG (SOFTGOODS) IMPLANT
SLING ARM IMMOBILIZER MED (SOFTGOODS) IMPLANT
SPONGE GAUZE 4X4 12PLY (GAUZE/BANDAGES/DRESSINGS) IMPLANT
SPONGE LAP 4X18 X RAY DECT (DISPOSABLE) ×2 IMPLANT
STRIP CLOSURE SKIN 1/2X4 (GAUZE/BANDAGES/DRESSINGS) IMPLANT
SUT MNCRL AB 3-0 PS2 18 (SUTURE) ×2 IMPLANT
SUT PDS AB 0 CT 36 (SUTURE) IMPLANT
SUT RETRIEVER GRASP 30 DEG (SUTURE) ×2 IMPLANT
SYR 20CC LL (SYRINGE) IMPLANT
TAPE PAPER 3X10 WHT MICROPORE (GAUZE/BANDAGES/DRESSINGS) IMPLANT
TOWEL OR 17X24 6PK STRL BLUE (TOWEL DISPOSABLE) ×2 IMPLANT
TOWEL OR 17X26 10 PK STRL BLUE (TOWEL DISPOSABLE) ×2 IMPLANT
WAND SUCTION MAX 4MM 90S (SURGICAL WAND) ×2 IMPLANT
WATER STERILE IRR 1000ML POUR (IV SOLUTION) ×2 IMPLANT

## 2012-01-28 NOTE — H&P (Signed)
[name]    Chief Complaint: LEFT SHOULDER ROTATOR CUFF TEAR HPI: The patient is a 56 y.o. female with chronic left shoulder pain refractory to conservative Rx.  Past Medical History  Diagnosis Date  . Diabetes mellitus   . HTN (hypertension)   . Obesity   . Asthma   . Sciatica   . Rotator cuff tear   . Disorder of vocal cord   . Anxiety   . COPD (chronic obstructive pulmonary disease)   . Shortness of breath   . Sleep apnea     CPAP, sleep study on [facility]  . Headache   . Arthritis     Past Surgical History  Procedure Date  . Nasal sinus surgery 1992  . Total abdominal hysterectomy 1985  . Toe surgery     for ingrown toenail  . Appendectomy     Family History  Problem Relation Age of Onset  . Heart disease Mother   . Stomach cancer Maternal Grandmother   . Asthma Son   . Asthma Daughter   . Asthma Mother     Social History:  reports that she quit smoking about 15 years ago. Her smoking use included Cigarettes. She has a 25 pack-year smoking history. She has never used smokeless tobacco. She reports that she does not drink alcohol or use illicit drugs.  Allergies:  Allergies  Allergen Reactions  . Lisinopril Cough    Medications Prior to Admission  Medication Sig Dispense Refill  . albuterol (PROVENTIL HFA;VENTOLIN HFA) 108 (90 BASE) MCG/ACT inhaler Inhale 2 puffs into the lungs every 6 (six) hours as needed. For shortness of breath      . ALPRAZolam (XANAX) 0.5 MG tablet Take 0.5 mg by mouth 2 (two) times daily.      Marland Kitchen aspirin 81 MG tablet Take 81 mg by mouth 2 (two) times daily.       Marland Kitchen atorvastatin (LIPITOR) 20 MG tablet Take 20 mg by mouth daily.        . insulin detemir (LEVEMIR) 100 UNIT/ML injection Inject 60 Units into the skin 2 (two) times daily.       Marland Kitchen JANUVIA 100 MG tablet Take 1 tablet by mouth daily.      Marland Kitchen losartan (COZAAR) 100 MG tablet Take 100 mg by mouth daily.        . metFORMIN (GLUCOPHAGE) 500 MG tablet Take 1,000 mg by mouth 2 (two)  times daily with a meal.       . mometasone (NASONEX) 50 MCG/ACT nasal spray Place 2 sprays into the nose daily.        . pantoprazole (PROTONIX) 40 MG tablet Take 40 mg by mouth daily.       Marland Kitchen ibuprofen (ADVIL,MOTRIN) 800 MG tablet Take 800 mg by mouth every 8 (eight) hours as needed. For pain      . loratadine (CLARITIN) 10 MG tablet Take 10 mg by mouth daily.       . traMADol (ULTRAM) 50 MG tablet Take 50 mg by mouth every 6 (six) hours as needed. For pain      . vitamin D, CHOLECALCIFEROL, 400 UNITS tablet Take 400 Units by mouth daily.           Physical Exam: Left shoulder with painful ROM and examination as documented at 6/3 13 office visit. MRI shows full thickness rotator cuff tear  Vitals  Temp:  [98.3 F (36.8 C)] 98.3 F (36.8 C) (07/18 0819) Pulse Rate:  [61] 61  (  07/18 0819) Resp:  [18] 18  (07/18 0819) BP: (138)/(85) 138/85 mmHg (07/18 0819) SpO2:  [99 %] 99 % (07/18 0819)  Assessment/Plan  Impression: LEFT SHOULDER ROTATOR CUFF TEAR  Plan of Action: Procedure(s): SHOULDER ARTHROSCOPY WITH ROTATOR CUFF REPAIR AND SUBACROMIAL DECOMPRESSION and distal clavicle resection  Deaunte Dente M 01/28/2012, 9:33 AM

## 2012-01-28 NOTE — Addendum Note (Signed)
Addendum  created 01/28/12 1423 by De Nurse, CRNA   Modules edited:Anesthesia Flowsheet

## 2012-01-28 NOTE — Op Note (Signed)
01/28/2012  12:57 PM  PATIENT:   [name]  56 y.o. female  PRE-OPERATIVE DIAGNOSIS:  LEFT SHOULDER ROTATOR CUFF TEAR  POST-OPERATIVE DIAGNOSIS:  Same with labral tear, impingement, AC joint OA  PROCEDURE:  LSA, labral debridement, SAD, DCR, RCR  SURGEON:  Corisa Montini, Vania Rea M.D.  ASSISTANTS: Shuford pac   ANESTHESIA:   GET + ISB  EBL: min  SPECIMEN:  none  Drains: none   PATIENT DISPOSITION:  PACU - hemodynamically stable.    PLAN OF CARE: Admit for overnight observation  Dictation# [id number]

## 2012-01-28 NOTE — Discharge Instructions (Signed)
   Kevin M. Supple, M.D., F.A.A.O.S. °Orthopaedic Surgery °Specializing in Arthroscopic and Reconstructive °Surgery of the Shoulder and Knee °[phone] °[address] - [place], [place] [address] - Fax [phone] ° °POST-OP SHOULDER ARTHROSCOPIC ROTATOR CUFF AND/OR LABRAL REPAIR INSTRUCTIONS ° °1. Call the office at [phone] to schedule your first post-op appointment 7-10 days from the date of your surgery. ° °2. Leave the steri-strips in place over your incisions when performing dressing changes and showering. You may remove your dressings and begin showering 72 hours from surgery. You can expect drainage that is clear to bloody in nature that occasionally will soak through your dressings. If this occurs go ahead and perform a dressing change. The drainage should lessen daily and when there is no drainage from your incisions feel free to go without a dressing. ° °3. Wear your sling/immobilizer at all times except to perform the exercises below or to occasionally let your arm dangle by your side to stretch your elbow. You also need to sleep in your sling immobilizer until instructed otherwise. ° °4. Range of motion to your elbow, wrist, and hand are encouraged 3-5 times daily. Exercise to your hand and fingers helps to reduce swelling you may experience. ° °5. Utilize ice to the shoulder 3-4 times minimum a day and additionally if you are experiencing pain. ° °6. You may one-armed drive when safely off of narcotics and muscle relaxants. You may use your hand that is in the sling to support the steering wheel only. However, should it be your right arm that is in the sling it is not to be used for gear shifting in a manual transmission. ° °7. If you had a block pre-operatively to provide post-op pain relief you may want to go ahead and begin utilizing your pain meds as your arm begins to wake up. Blocks can sometimes last up to 16-18 hours. If you are still pain-free prior to going to bed you may  want to strongly consider taking a pain medication to avoid being awakened in the night with the onset of pain. A muscle relaxant is also provided for you should you experience muscle spasms. It is recommended that if you are experiencing pain that your pain medication alone is not controlling, add the muscle relaxant along with the pain medication which can give additional pain relief. The first one to two days is generally the most severe of your pain and then should gradually decrease. As your pain lessens it is recommended that you decrease your use of the pain medications to an "as needed basis" only and to always comply with the recommended dosages of the pain medications. ° °8. Pain medications can produce constipation along with their use. If you experience this, the use of an over the counter stool softener or laxative daily is recommended.  ° °9. For additional questions or concerns, please do not hesitate to call the office. If after hours there is an answering service to forward your concerns to the physician on call. ° °POST-OP EXERCISES ° °Pendulum Exercises ° °Perform pendulum exercises while standing and bending at the waist. Support your uninvolved arm on a table or chair and allow your operated arm to hang freely. Make sure to do these exercises passively - not using you shoulder muscle. ° °Repeat 20 times. Do 3 sessions per day. ° ° °

## 2012-01-28 NOTE — Preoperative (Signed)
Beta Blockers   Reason not to administer Beta Blockers:Not Applicable 

## 2012-01-28 NOTE — Anesthesia Preprocedure Evaluation (Addendum)
Anesthesia Evaluation  Patient identified by MRN, date of birth, ID band Patient awake    Reviewed: Allergy & Precautions, H&P , NPO status , Patient's Chart, lab work & pertinent test results  Airway Mallampati: II TM Distance: >3 FB Neck ROM: full    Dental  (+) Partial Upper and Dental Advisory Given   Pulmonary shortness of breath, asthma , sleep apnea and Continuous Positive Airway Pressure Ventilation , COPD COPD inhaler,  breath sounds clear to auscultation        Cardiovascular hypertension, Rhythm:regular Rate:Normal     Neuro/Psych  Headaches,  Neuromuscular disease    GI/Hepatic GERD-  ,  Endo/Other  Type 2, Insulin Dependent and Oral Hypoglycemic Agents  Renal/GU      Musculoskeletal   Abdominal   Peds  Hematology   Anesthesia Other Findings   Reproductive/Obstetrics                          Anesthesia Physical Anesthesia Plan  ASA: III  Anesthesia Plan: General   Post-op Pain Management:    Induction: Intravenous  Airway Management Planned: Oral ETT  Additional Equipment:   Intra-op Plan:   Post-operative Plan: Extubation in OR  Informed Consent: I have reviewed the patients History and Physical, chart, labs and discussed the procedure including the risks, benefits and alternatives for the proposed anesthesia with the patient or authorized representative who has indicated his/her understanding and acceptance.     Plan Discussed with: CRNA, Anesthesiologist and Surgeon  Anesthesia Plan Comments:         Anesthesia Quick Evaluation

## 2012-01-28 NOTE — Transfer of Care (Signed)
Immediate Anesthesia Transfer of Care Note  Patient: [name]  Procedure(s) Performed: Procedure(s) (LRB): SHOULDER ARTHROSCOPY WITH ROTATOR CUFF REPAIR AND SUBACROMIAL DECOMPRESSION (Left)  Patient Location: PACU  Anesthesia Type: General  Level of Consciousness: awake, alert  and oriented  Airway & Oxygen Therapy: Patient Spontanous Breathing and Patient connected to nasal cannula oxygen  Post-op Assessment: Report given to PACU RN  Post vital signs: Reviewed and stable  Complications: No apparent anesthesia complications

## 2012-01-28 NOTE — Anesthesia Procedure Notes (Addendum)
Anesthesia Regional Block:  Interscalene brachial plexus block  Pre-Anesthetic Checklist: ,, timeout performed, Correct Patient, Correct Site, Correct Laterality, Correct Procedure, Correct Position, site marked, Risks and benefits discussed,  Surgical consent,  Pre-op evaluation,  At surgeon's request and post-op pain management  Laterality: Left  Prep: Maximum Sterile Barrier Precautions used, chloraprep and alcohol swabs       Needles:  Injection technique: Single-shot  Needle Type: Stimulator Needle - 40        Needle insertion depth: 4 cm   Additional Needles:  Procedures: nerve stimulator Interscalene brachial plexus block  Nerve Stimulator or Paresthesia:  Response: 0.5 mA, 0.1 ms, 4 cm  Additional Responses:   Narrative:  Start time: 01/28/2012 10:00 AM End time: 01/28/2012 10:08 AM Injection made incrementally with aspirations every 5 mL.  Performed by: Personally  Anesthesiologist: Maren Beach MD  Additional Notes: I explained procedure, risks, complications to pt and pt accepted. 20 cc 0.5% Marcaine w/ epi w/o discomfort or difficulty. GES   Procedure Name: Intubation Date/Time: 01/28/2012 11:27 AM Performed by: Jefm Miles E Pre-anesthesia Checklist: Patient identified, Timeout performed, Emergency Drugs available, Suction available and Patient being monitored Patient Re-evaluated:Patient Re-evaluated prior to inductionOxygen Delivery Method: Circle system utilized Preoxygenation: Pre-oxygenation with 100% oxygen Intubation Type: IV induction Ventilation: Mask ventilation with difficulty and Two handed mask ventilation required Laryngoscope Size: Mac and 3 Grade View: Grade I Tube type: Oral Tube size: 7.0 mm Number of attempts: 1 Airway Equipment and Method: Stylet Placement Confirmation: ETT inserted through vocal cords under direct vision,  breath sounds checked- equal and bilateral and positive ETCO2 Secured at: 22 cm Tube secured with:  Tape Dental Injury: Teeth and Oropharynx as per pre-operative assessment

## 2012-01-28 NOTE — Addendum Note (Signed)
Addendum  created 01/28/12 1423 by Isabell Bonafede E Luretha Eberly, CRNA   Modules edited:Anesthesia Flowsheet    

## 2012-01-28 NOTE — Anesthesia Postprocedure Evaluation (Signed)
  Anesthesia Post-op Note  Patient: [name]  Procedure(s) Performed: Procedure(s) (LRB): SHOULDER ARTHROSCOPY WITH ROTATOR CUFF REPAIR AND SUBACROMIAL DECOMPRESSION (Left)  Patient Location: PACU  Anesthesia Type: GA combined with regional for post-op pain  Level of Consciousness: awake, alert , oriented and patient cooperative  Airway and Oxygen Therapy: Patient Spontanous Breathing and Patient connected to nasal cannula oxygen  Post-op Pain: mild  Post-op Assessment: Post-op Vital signs reviewed, Patient's Cardiovascular Status Stable, Respiratory Function Stable, Patent Airway, No signs of Nausea or vomiting and Pain level controlled  Post-op Vital Signs: stable  Complications: No apparent anesthesia complications

## 2012-01-28 NOTE — Progress Notes (Signed)
Pt refuses Cpap@night  for now. She states she doesn't wear it that much at home and she's going home tomorrow. Pt alerted if she changes her mind to have her nurse call resp care.

## 2012-01-29 ENCOUNTER — Encounter (HOSPITAL_COMMUNITY): Payer: Self-pay | Admitting: General Practice

## 2012-01-29 DIAGNOSIS — M75100 Unspecified rotator cuff tear or rupture of unspecified shoulder, not specified as traumatic: Secondary | ICD-10-CM

## 2012-01-29 LAB — GLUCOSE, CAPILLARY: Glucose-Capillary: 128 mg/dL — ABNORMAL HIGH (ref 70–99)

## 2012-01-29 MED ORDER — TEMAZEPAM 30 MG PO CAPS: 30.0000 mg | ORAL_CAPSULE | Freq: Every evening | ORAL | Status: DC | PRN

## 2012-01-29 MED ORDER — DIAZEPAM 5 MG PO TABS: 5.0000 mg | ORAL_TABLET | Freq: Four times a day (QID) | ORAL | Status: AC | PRN

## 2012-01-29 MED ORDER — OXYCODONE-ACETAMINOPHEN 5-325 MG PO TABS: 1.0000 | ORAL_TABLET | ORAL | Status: AC | PRN

## 2012-01-29 MED ORDER — NAPROXEN 500 MG PO TABS: 500.0000 mg | ORAL_TABLET | Freq: Two times a day (BID) | ORAL | Status: AC

## 2012-01-29 NOTE — Discharge Summary (Signed)
PATIENT ID:      [name]  MRN:     [id number] DOB/AGE:    1955/08/30 / 56 y.o.     DISCHARGE SUMMARY  ADMISSION DATE:    01/28/2012 DISCHARGE DATE:   01/29/2012   ADMISSION DIAGNOSIS: LEFT SHOULDER ROTATOR CUFF TEAR  (LEFT SHOULDER ROTATOR CUFF TEAR)  DISCHARGE DIAGNOSIS:  LEFT SHOULDER ROTATOR CUFF TEAR    ADDITIONAL DIAGNOSIS: Active Problems:  Rotator cuff tear  Past Medical History  Diagnosis Date  . HTN (hypertension)   . Obesity   . Asthma   . Sciatica   . Rotator cuff tear   . Anxiety   . COPD (chronic obstructive pulmonary disease)   . Sleep apnea     CPAP, sleep study on [facility]  . Headache   . Environmental allergies     "year round"  . High cholesterol   . Cold extremity without peripheral vascular disease     bilaterally; "from my knees down into my feet; they get ice cold"  . Heart murmur   . Chronic bronchitis   . Vocal cord dysfunction     "I've had it for many years"  . Exertional dyspnea   . Type II diabetes mellitus   . GERD (gastroesophageal reflux disease)   . Arthritis     "both shoulders"  . Sciatic nerve pain     "tailbone down into my legs when it flares up"    PROCEDURE: Procedure(s): SHOULDER ARTHROSCOPY WITH ROTATOR CUFF REPAIR AND SUBACROMIAL DECOMPRESSION on 01/28/2012  CONSULTS:     HISTORY:  See H&P in chart  HOSPITAL COURSE:  [name] is a 56 y.o. admitted on 01/28/2012 and found to have a diagnosis of [place].  After appropriate laboratory studies were obtained  they were taken to the operating room on 01/28/2012 and underwent Procedure(s): SHOULDER ARTHROSCOPY WITH ROTATOR CUFF REPAIR AND SUBACROMIAL DECOMPRESSION.   They were given perioperative antibiotics:  Anti-infectives     Start     Dose/Rate Route Frequency Ordered Stop   01/28/12 1730   ceFAZolin (ANCEF) IVPB 1 g/50 mL premix        1 g 100 mL/hr over 30 Minutes Intravenous Every 6 hours 01/28/12 1527 01/29/12 0609   01/27/12 1444    ceFAZolin (ANCEF) IVPB 2 g/50 mL premix        2 g 100 mL/hr over 30 Minutes Intravenous 60 min pre-op 01/27/12 1444 01/28/12 1130        . Blood products given:none  Patient did well with surgery and pain was controlled on am following surgery.  The remainder of the hospital course was dedicated to ambulation and strengthening.   The patient was discharged on 1 Day Post-Op in  Good condition.

## 2012-01-29 NOTE — Op Note (Signed)
NAMESTEPHINIE, JOLLIE                ACCOUNT NO.:  [id number]  MEDICAL RECORD NO.:  [id number]  LOCATION:  6N17C                        FACILITY:  [facility]  PHYSICIAN:  Metta Clines. Aleksander Edmiston, M.D.  DATE OF BIRTH:  11-Jul-1956  DATE OF PROCEDURE:  01/28/2012 DATE OF DISCHARGE:                              OPERATIVE REPORT   PREOPERATIVE DIAGNOSES: 1. Chronic left shoulder pain with impingement syndrome. 2. Left shoulder rotator cuff tear. 3. Left shoulder acromioclavicular joint arthropathy.  POSTOPERATIVE DIAGNOSES: 1. Chronic left shoulder pain with impingement syndrome. 2. Left shoulder rotator cuff tear. 3. Left shoulder acromioclavicular joint arthropathy. 4. Left shoulder complex and extensive labral tear.  PROCEDURE: 1. Left shoulder examination under anesthesia. 2. Left shoulder diagnostic arthroscopy. 3. Debridement of complex and extensive degenerative labral tear. 4. Arthroscopic subacromial decompression and bursectomy. 5. Arthroscopic distal clavicle resection. 6. Arthroscopic rotator cuff repair using a double-row suture bridge     repair construct.  SURGEON:  Metta Clines. Marcio Hoque, MD  ASSISTANT:  Reather Laurence. Shuford, PA-C  ANESTHESIA:  A general endotracheal as well as an interscalene block.  BLOOD LOSS:  Minimal.  DRAINS:  None.  HISTORY:  [name] is a 56 year old female who has had chronic progressive increasing left shoulder pain with an impingement syndrome. An MRI scan evidence for full-thickness rotator cuff tear.  Due to her ongoing pain and functional limitations, she is brought to the operating room at this time for planned left shoulder arthroscopy as described below.  I preoperatively counseled Ms. Brackney on treatment options as well as risks versus benefits thereof.  Possible surgical complications were reviewed including the potential for bleeding, infection, neurovascular injury, persistent pain, loss of motion, anesthetic complication, recurrence of  rotator cuff tear, and possible need for additional surgery.  She understands and accepts and agrees with our planned procedure.  PROCEDURE IN DETAIL:  After undergoing routine preop evaluation, the patient received prophylactic antibiotics.  An interscalene block was established in the holding area by the Anesthesia Department.  Placed supine on the operating table and underwent general endotracheal anesthesia.  Turned to the right lateral decubitus position on a beanbag and appropriately padded and protected.  Left shoulder examination under anesthesia revealed full motion and no instability patterns were noted. Left arm was suspended at 70 degrees of abduction with 10 pounds of traction.  The left shoulder girdle region was sterilely prepped and draped in standard fashion.  Time-out was called.  Posterior portal was established in glenohumeral joint, anterior portal was established under direct visualization.  The glenohumeral articular surfaces were in good condition.  The labrum showed extensive degenerative tearing across the superior half and this was all debrided with a shaver to a stable margin.  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  I did not see any obvious pathologic changes to this insertion.  We did debride the rotator cuff in this area.  No full-thickness defects were medially evident.  Strongly, there was evidence for degeneration.  The MRI scan suggested there was a tear, which based on  this view involved primarily at the bursal surface.  Inspection of the glenohumeral joint, no additional pathologies were identified.  Fluid and instruments were removed.  The arm was dropped down to 30 degrees of abduction and the arthroscope was introduced into the subacromial space through the posterior portal and a direct lateral portal  was established in the subacromial space.  Abundant dense bursal tissue, multiple adhesions were accounted throughout the subacromial/subdeltoid bursal space and these were all divided and excised with a combination of shaver and Stryker wand.  The wand was then used to remove the periosteum from the undersurface of the anterior half of the acromion and then a subacromial decompression was performed with a bur creating a type 1 morphology. The portal was then established directly anterior to the distal clavicle and the distal clavicle resection was performed a bur.  Care was taken to confirm visualization of the entire circumference of the distal clavicle to ensure adequate removal of the bone.  We then completed the subacromial/subdeltoid bursectomy and debrided the free torn margin of the rotator cuff, which the tear was readily evident with a footprint approximately 3 cm in width.  There was just a thin veil of tissue that had been remaining on the articular side.  The torn margin of the rotator cuff was debrided back to healthy tissue.  Assistant portal and device was established.  We prepared the greater tuberosity on gently abrading the bone to bleeding bed.  Through the stab wound of the lateral margin of the acromion, we placed an Arthrex corkscrew suture anchor.  The limbs of the suture anchor were then placed through the free margin of the rotator cuff and holes on a mattress pattern and then tied with series of sliding locking knot followed by multiple overhand throws and alternating posts.  We then created "suture bridge" with nicely compressed the free margin of the rotator cuff against the bony bed of the tuberosity.  Suture limbs were all clipped.  Overall construct was much to our satisfaction.  Final hemostasis was obtained.  Jenetta Loges, PA-C was used as an Environmental consultant throughout this case, essential for help with positioning of the extremity, management of  the arthroscopic equipment, suture management, tissue manipulation, wound closure, and intraoperative decision making as well as explaining the case, to be done in a timely and efficient manner.  The wound was closed with Monocryl and Steri-Strips.  Dry dressing taped about the left shoulder, left arm was placed in sling, and the patient was awakened, extubated, and taken to the recovery room in stable condition.     Metta Clines. Dezhane Staten, M.D.    KMS/MEDQ  D:  01/28/2012  T:  01/29/2012  Job:  [id number]

## 2012-01-29 NOTE — Care Management Note (Signed)
  Page 1 of 1   01/29/2012     10:28:53 AM   CARE MANAGEMENT NOTE 01/29/2012  Patient:  [name], [name]   Account Number:  [id number]  Date Initiated:  01/29/2012  Documentation initiated by:  Ronny Flurry  Subjective/Objective Assessment:     Action/Plan:   Anticipated DC Date:  01/29/2012   Anticipated DC Plan:  HOME/SELF CARE         Choice offered to / List presented to:             Status of service:  Completed, signed off Medicare Important Message given?   (If response is "NO", the following Medicare IM given date fields will be blank) Date Medicare IM given:   Date Additional Medicare IM given:    Discharge Disposition:    Per UR Regulation:  Reviewed for med. necessity/level of care/duration of stay  If discussed at Long Length of Stay Meetings, dates discussed:    Comments:  01-28-12 Discussed patient with Ralene Bathe, PA. Per Ralene Bathe, PA no needs / orders for home health or equipment. Ronny Flurry RN BSN [phone]

## 2012-01-29 NOTE — Progress Notes (Signed)
OT orders received.  Attempted to see pt; however, pt. Has already discharged.  Spoke with RN who will contact PA - recommend HHOT.  Jeani Hawking, OTR/L [phone]

## 2012-02-08 ENCOUNTER — Ambulatory Visit: Payer: Medicare Other | Attending: Orthopedic Surgery | Admitting: Physical Therapy

## 2012-02-08 DIAGNOSIS — IMO0001 Reserved for inherently not codable concepts without codable children: Secondary | ICD-10-CM | POA: Insufficient documentation

## 2012-02-08 DIAGNOSIS — M25619 Stiffness of unspecified shoulder, not elsewhere classified: Secondary | ICD-10-CM | POA: Insufficient documentation

## 2012-02-08 DIAGNOSIS — M25519 Pain in unspecified shoulder: Secondary | ICD-10-CM | POA: Insufficient documentation

## 2012-02-16 ENCOUNTER — Ambulatory Visit: Payer: Medicare Other | Attending: Orthopedic Surgery | Admitting: Physical Therapy

## 2012-02-16 DIAGNOSIS — M25619 Stiffness of unspecified shoulder, not elsewhere classified: Secondary | ICD-10-CM | POA: Insufficient documentation

## 2012-02-16 DIAGNOSIS — IMO0001 Reserved for inherently not codable concepts without codable children: Secondary | ICD-10-CM | POA: Insufficient documentation

## 2012-02-16 DIAGNOSIS — M25519 Pain in unspecified shoulder: Secondary | ICD-10-CM | POA: Insufficient documentation

## 2012-02-18 ENCOUNTER — Encounter: Payer: Medicare Other | Admitting: Physical Therapy

## 2012-02-22 ENCOUNTER — Encounter: Payer: Medicare Other | Admitting: Physical Therapy

## 2012-02-24 ENCOUNTER — Encounter: Payer: Medicare Other | Admitting: Physical Therapy

## 2012-02-29 ENCOUNTER — Ambulatory Visit: Payer: Medicare Other | Admitting: Physical Therapy

## 2012-03-07 ENCOUNTER — Encounter: Payer: Medicare Other | Admitting: Physical Therapy

## 2012-08-31 ENCOUNTER — Other Ambulatory Visit: Payer: Self-pay | Admitting: Internal Medicine

## 2012-08-31 DIAGNOSIS — Z1231 Encounter for screening mammogram for malignant neoplasm of breast: Secondary | ICD-10-CM

## 2012-09-07 ENCOUNTER — Other Ambulatory Visit: Payer: Self-pay

## 2012-09-07 DIAGNOSIS — Z1231 Encounter for screening mammogram for malignant neoplasm of breast: Secondary | ICD-10-CM

## 2012-09-30 ENCOUNTER — Ambulatory Visit: Payer: Medicare Other

## 2012-10-06 ENCOUNTER — Ambulatory Visit: Payer: Medicare Other

## 2012-10-07 ENCOUNTER — Ambulatory Visit
Admission: RE | Admit: 2012-10-07 | Discharge: 2012-10-07 | Disposition: A | Discharge status: Home / Self Care | Payer: Medicare Other | Source: Ambulatory Visit

## 2012-10-07 DIAGNOSIS — Z1231 Encounter for screening mammogram for malignant neoplasm of breast: Secondary | ICD-10-CM

## 2013-03-21 ENCOUNTER — Ambulatory Visit (INDEPENDENT_AMBULATORY_CARE_PROVIDER_SITE_OTHER): Payer: Medicare Other | Admitting: Internal Medicine

## 2013-03-21 ENCOUNTER — Encounter: Payer: Self-pay | Admitting: Internal Medicine

## 2013-03-21 VITALS — HR 72 | Temp 98.6°F | Ht 66.0 in | Wt 248.0 lb

## 2013-03-21 DIAGNOSIS — R053 Chronic cough: Secondary | ICD-10-CM

## 2013-03-21 DIAGNOSIS — J45909 Unspecified asthma, uncomplicated: Secondary | ICD-10-CM

## 2013-03-21 DIAGNOSIS — R05 Cough: Secondary | ICD-10-CM

## 2013-03-21 MED ORDER — LEVOFLOXACIN 500 MG PO TABS: 500.0000 mg | ORAL_TABLET | Freq: Every day | ORAL | Status: DC

## 2013-03-21 MED ORDER — PREDNISONE 10 MG PO TABS: ORAL_TABLET | ORAL | Status: DC

## 2013-03-21 NOTE — Patient Instructions (Addendum)
You are having acute exacerbation of bronchitis -Take prednisone 40 mg daily x 2 days, then 20mg  daily x 2 days, then 10mg  daily x 2 days, then 5mg  daily x 2 days and stop  - take levaquin 500mg  once daily  X 5 days - If symptoms get worse go to ER - Hold off flu shot 03/21/2013 due to acute illness; have it in 10-14 days from now - Follow with NP Tammy in 1 month for medicine calendar and progress

## 2013-03-21 NOTE — Assessment & Plan Note (Signed)
She is describing acute exacerbation . She is pretty keen on having antibiotic and prednisone treatment. Therefore we'll treat her for the same. After this she will see my nurse practitioner for medicine calendar in a few months. At that time if cough is still present then we'll restart evaluation for chronic cough  Hold off flu shot due to illness

## 2013-03-21 NOTE — Progress Notes (Signed)
Subjective:    Patient ID: [name], female    DOB: Dec 12, 1955, 57 y.o.   MRN: [id number]  HPI OV 02/17/2011. 57 year old female. Obese. AA female. Remote smoker On cpap for OSA (pmd managing). REferred by Dr Delfina Redwood.    New visit for chronic cough. Preesent for many years. Insidious onset.  Progressively with more frequency past few years. Cough rated as moderate. Usually dry cough with associated barking quality. Occ mucus present; thick and clear. Cough worsened by milk products, change in weather and dust. Hx of prior ENT eval in [place] > 15 years ago: diagnosed as "vocal cords sticking together" and remote hx of sinus surgery Also diagnosed with GERD in [place] > 15 years ago; takes protonix.  [facility] Reflux Symptom INdex score is 39 and very c/w LPR cough(severe hoarsness of voice, clearing of throat, ecess throat mucus, post nasal drip, coughing after lying down or eating, choking epidoses, sensation of globus with food sticking in thorat) and associated heart burn. Cough so severe that she could not complete spirometry in office today  Has dyspnea too: few years, insidious onset. Progressive. Rates it as moderate. Dyspnea brought on by associated chest tightness and nasal drainage or even exertion like climbing flight of steps. Dyspnea relieved by rest. Cough and dyspnea associated with wheezing and constant yellow sinus drainage as well. Frequent nocturnal awakenings with choking present. Above resulted in asthma diagnosis versus tracheobronchiolotis > 15 years ago: on advair since then which she is unsure is helping. She herself is not convinced she has asthma.   Associated med intake shows tramadol (for shoulder pain and sicatica), fish oil/ flaxseed (2 years) and ACE inhibitor intake (been on lisniopril for > 10 years due to diabetes)   Pas med hx review fromn outside record notes "hx of asthma and vocal cord dysfunction and post nasal drip. Recalls frequent ER visits atleast one per  month (last er visit 2 months ago per hx) to [facility]. Denies ICU admits, intubations for same.   REC Cough is from ACE Inhibitor, sinus drainage, vocal cord dysfunction, acid reflux and possible cough all conspiring to cause cyclical cough/LPR cough #Sinus drainage  - continue nasal steroid - refer to [facility] Dr. Wilburn Cornelia or Dr Redmond Baseman #Possible Acid Reflux  - continue protonix   - take diet sheet from [place] - avoid colas, spices, cheeses, spirits, red meats, beer, chocolates, fried foods etc.,   - sleep with head end of bed elevated  - eat small frequent meals  - do not go to bed for 3 hours after last meal - stop fish oil and flaxseed for now #Asthma   - continue advair for now - might have to consider changing over to something else later depending on ENT evaluation #Vocal Cord Dysfunction - see ENT doctor first  -later  will consider referral to speech therapy with [name] #BP  - stop lisinopril - take benicar 1 tablet daily - take sample, nurse will add it in med list #Followup - I will see you in 4-6 weeks.  - Reattempt spirometry with [name] at time of followup -Important you comply with advice 100% correctly   OV 03/31/11: Followup cough. SAw ENT 89/17./12: no anatomic path or VCD noticed at time of exam though patient noted to have choked.  She states that she was not happy with her experience at ENT visit and does not want to visit the same office again. She is compliant with instructions except  for diet though she has made improvements there. Off lisniopril. Follows sinus, gerd and astham advice. Does not like advair diskus. Overall cough is much better. She is happy with improvement. RSI cough score dropped from 39 to 14.  She sses her hoarsenss, clearing and pos nasal drip as mild-moderate  PFTS show no more flow volume loop issues. There is restriction with mixed obstruction. DLCO 67%   REC Cough is from sinus drainage, vocal cord dysfunction, acid  reflux and possible cough all conspiring to cause cyclical cough/LPR cough  #Sinus drainage  - continue nasal steroid - nurse will do script for generic fluticasone -  #Acid Reflux  - continue protonix   - take diet sheet from [place] [facility] - avoid colas, spices, cheeses, spirits, red meats, beer, chocolates, fried foods etc.,   - sleep with head end of bed elevated  - eat small frequent meals  - do not go to bed for 3 hours after last meal - cotninue to avoid fish oil and flaxseed   #Asthma   - continue advair but nurse will give you sample of HFA and teach you how to take it with spacer; she will do script as well  #Vocal Cord Dysfunction - referral to speech therapy with [name]  #BP  - will add lisinopril to allergy list - take benicar 1 tablet daily or the equivalent Dr Delfina Redwood gives you  #Followup - I will see you in 8 weeks.  - -Important you comply with advice 100% correctly - Glad you are much better - Flu shot today please   OV 06/26/2011 Followup for chronic cough. She continues to do better. Subjectively feels much better compared to last visit. Although objectively her cough score has only reduced by 1 point. Today, RSI cough score is 13. Level III postnasal drip. Level to hoarseness of voice, cough after lying down, choking episodes, and annoying cough. Level I sensation of lump in throat and heartburn.  In talking to her she is compliant with her nasal steroid and her Protonix for acid reflux but not compliant with diet for acid reflux. She is on Advair HFA with spacer for presumed asthma and she feels this makes her cough less compared to Advair discus. We discussed more about her asthma history and she says that she was never sure she had asthma diagnosis in [place] it was a presumptive diagnosis. She is eager to come off Advair and give a trial without it. She also gives a history of allergies and allergy shots while living in [place] and she is open to an  allergy evaluation right now. In terms of cyclical cough she has finished speech therapy evaluation and graduated from the program. Overall she feels quality-of-life is acceptable although she would like to see cough improve further. So far we have not tried Neurontin    Past, Family, Social reviewed: no change since last visit except that Dr Deforest Hoyles is new PMD. She now states she has year round allergies. Recollects while in [place] used to take allergy shots twice a week 10 years ago. Since moving to [place] 4 years ago not seen an allergist. Laverle Hobby to see allergist. She feels she does not have asthma.  Cough is from sinus drainage, vocal cord dysfunction, acid reflux and possible asthma or allergiesh all working together to cause cyclical cough/LPR cough  #Sinus drainage  - continue nasal steroid - nurse will do script for generic fluticasone  -  #Acid Reflux  - continue protonix  -  contniue (REALLY IMPORANT IS DIET) diet sheet from [place] [facility] - avoid colas, spices, cheeses, spirits, red meats, beer, chocolates, fried foods etc.,  - sleep with head end of bed elevated  - eat small frequent meals  - do not go to bed for 3 hours after last meal  - cotninue to avoid fish oil and flaxseed  #Asthma  - since diagnosis of asthma was in doubt. Please stop Advair and just use albuterol as needed  -We'll do spirometry at followup  # allergies  - Please see Dr. Baird Lyons in our office for allergy evaluation; will do referral  #Vocal Cord Dysfunction  - congratulations on graduating from program with speech therapy [name]  #BP  - Continue losartan  #Followup  - I will see you in 8 weeks.  - -Important you comply with advice 100% correctly  - Glad you are much better  - Cough score worksheet at followup   OV 08/25/2011 Followup for chronic cough. She is off advair as advised. She is doing gerd control.  Ojectively her cough score is not reduced. Last visit score was 13 but now it is 14.  (Level III postnasal drip, and post nasal drip. . Level 2  hoarseness of voice and choking episodes. Level 1 - cough after lying down,and  sensation of lump in throat and heartburn). Howevefr, subjectively cough much improved. Still with post nasal drip. Has seen Dr Annamaria Boots for allergy 08/06/11 and 08/19/11. RAST serum profile negative except for elevated [place] IGE. Allergy skin test pending 09/16/11. On visit 08/19/11 to Dr Annamaria Boots had atypical chest pain - releived with ppi in office. EKG okay. Trop was normal. . Of note, Dr Annamaria Boots is also addressing her prior OSA issues.   ROS c/o anxiety, insomnia, also chest pains  - atypical  Spirometry  - fev1 1.74L/72%. Ratio 78    Lab 08/19/11 1721  TROPONINI <0.01    #Chest pains  - please see [facility]ology asap - > equivocal stress test and but clinically low probability February 2013; placed in clinical followup #Cough  - for sinus: follow Dr Annamaria Boots recommendation  - for acid reflux: follow medications and diet  - for possible asthma: stay off advair and once cardiology ok, please have methacholine challenge test  #Followup  - complete cardiology visit and methacholine challenge  - 4-6 weeks from now  09/16/11 with Dr Annamaria Boots Allergist- 40 yoF former smoker referred by Dr Chase Caller who has been seeing her for multifactorial cough, questioning an allergic component At last visit she had presented with chest pain, preventing planned allergy skin testing. Now following with cardiology and pending CT scan of her heart/Dr. Burt Knack. Still complains of nasal congestion. Able to use her CPAP more regularly when her nose is not stopped up. Unable to do methacholine challenge last month because her baseline is from a tree scores were too low. CPAP autotitration done/[facility]. Pending download. PFT- 09/11/11- FVC 2.15/68%, FEV1 1.59/ 64%, FEV1/FVC 0.80. FEF 25-75 % 0.37/ 15%   OV 03/21/2013 Followup chronic cough  - I have not seen her in nearly 15 months. She  says that basically her cough resolved but cough is no longer chronic and only occurs intermittently when she has "bronchitis". In the last year she's had antibiotics and prednisone for acute episodes of coughing at least 3 times. In between episodes she is asymptomatic. Currently she feels she's having one such episode in the past 3 weeks with chest congestion, cough with white mucus,  fatigue and associated wheeze. Currently RSI cough score is 21 and show severe cough.  - Of note, she has not had methacholine challenge test that I advised a year and half ago. She says that she does not have vocal cord dysfunction based on Dr. Mickie Hillier exam in August 2012 ENT physician    Review of Systems  Constitutional: Negative for fever and unexpected weight change.  HENT: Negative for ear pain, nosebleeds, congestion, sore throat, rhinorrhea, sneezing, trouble swallowing, dental problem, postnasal drip and sinus pressure.   Eyes: Negative for redness and itching.  Respiratory: Negative for cough, chest tightness, shortness of breath and wheezing.   Cardiovascular: Negative for palpitations and leg swelling.  Gastrointestinal: Negative for nausea and vomiting.  Genitourinary: Negative for dysuria.  Musculoskeletal: Negative for joint swelling.  Skin: Negative for rash.  Neurological: Negative for headaches.  Hematological: Does not bruise/bleed easily.  Psychiatric/Behavioral: Negative for dysphoric mood. The patient is not nervous/anxious.    Current outpatient prescriptions:albuterol (PROVENTIL HFA;VENTOLIN HFA) 108 (90 BASE) MCG/ACT inhaler, Inhale 2 puffs into the lungs every 6 (six) hours as needed. For shortness of breath, Disp: , Rfl: ;  ALPRAZolam (XANAX) 0.5 MG tablet, Take 0.5 mg by mouth 2 (two) times daily., Disp: , Rfl: ;  aspirin 81 MG tablet, Take 81 mg by mouth daily. , Disp: , Rfl: ;  atorvastatin (LIPITOR) 20 MG tablet, Take 20 mg by mouth daily.  , Disp: , Rfl:   fluticasone-salmeterol (ADVAIR HFA) 115-21 MCG/ACT inhaler, Inhale 2 puffs into the lungs as needed., Disp: , Rfl: ;  insulin detemir (LEVEMIR) 100 UNIT/ML injection, Inject 60 Units into the skin 2 (two) times daily. , Disp: , Rfl: ;  loratadine (CLARITIN) 10 MG tablet, Take 10 mg by mouth daily. , Disp: , Rfl: ;  losartan (COZAAR) 100 MG tablet, Take 100 mg by mouth daily.  , Disp: , Rfl:  metFORMIN (GLUCOPHAGE) 500 MG tablet, Take 1,000 mg by mouth 2 (two) times daily with a meal. , Disp: , Rfl: ;  mometasone (NASONEX) 50 MCG/ACT nasal spray, Place 2 sprays into the nose daily.  , Disp: , Rfl: ;  pantoprazole (PROTONIX) 40 MG tablet, Take 40 mg by mouth daily. , Disp: , Rfl: ;  temazepam (RESTORIL) 30 MG capsule, Take 1 capsule (30 mg total) by mouth at bedtime as needed for sleep., Disp: 30 capsule, Rfl: 1 traMADol (ULTRAM) 50 MG tablet, Take 50 mg by mouth every 6 (six) hours as needed. For pain, Disp: , Rfl: ;  VICTOZA 18 MG/3ML SOPN, Take 1 tablet by mouth daily., Disp: , Rfl: ;  vitamin D, CHOLECALCIFEROL, 400 UNITS tablet, Take 400 Units by mouth daily.  , Disp: , Rfl:      Objective:   Physical Exam Vitals reviewed. Constitutional: She is oriented to person, place, and time. She appears well-developed and well-nourished. No distress.       Morbidly obese  HENT:  Head: Normocephalic and atraumatic.  Right Ear: External ear normal.  Left Ear: External ear normal.  Mouth/Throat: Oropharynx is clear and moist. No oropharyngeal exudate.  Eyes: Conjunctivae and EOM are normal. Pupils are equal, round, and reactive to light. Right eye exhibits no discharge. Left eye exhibits no discharge. No scleral icterus.  Neck: Normal range of motion. Neck supple. No JVD present. No tracheal deviation present. No thyromegaly present.       mallampatti class 4  Cardiovascular: Normal rate, regular rhythm, normal heart sounds and intact distal pulses.  Exam reveals no gallop and no friction rub.   No  murmur heard. Pulmonary/Chest: Effort normal and breath sounds normal. No respiratory distress. She has no wheezes. She has no rales. She exhibits no tenderness.  occasional wheeze that seems to be largely upper airway Abdominal: Soft. Bowel sounds are normal. She exhibits no distension and no mass. There is no tenderness. There is no rebound and no guarding.  Musculoskeletal: Normal range of motion. She exhibits no edema and no tenderness.  Lymphadenopathy:    She has no cervical adenopathy.  Neurological: She is alert and oriented to person, place, and time. She has normal reflexes. No cranial nerve deficit. She exhibits normal muscle tone. Coordination normal.  Skin: Skin is warm and dry. No rash noted. She is not diaphoretic. No erythema. No pallor.  Psychiatric: She has a normal mood and affect. Her behavior is normal. Judgment and thought content normal.         Assessment & Plan:

## 2013-03-26 NOTE — Assessment & Plan Note (Signed)
You are having acute exacerbation of bronchitis/asthma -Take prednisone 40 mg daily x 2 days, then 20mg  daily x 2 days, then 10mg  daily x 2 days, then 5mg  daily x 2 days and stop  - take levaquin 500mg  once daily  X 5 days - If symptoms get worse go to ER - Hold off flu shot 03/21/2013 due to acute illness; have it in 10-14 days from now - Follow with NP Tammy in 1 month for medicine calendar and progress

## 2013-04-20 ENCOUNTER — Other Ambulatory Visit (INDEPENDENT_AMBULATORY_CARE_PROVIDER_SITE_OTHER): Payer: Medicare Other

## 2013-04-20 ENCOUNTER — Encounter: Payer: Self-pay | Admitting: Adult Health

## 2013-04-20 ENCOUNTER — Ambulatory Visit (INDEPENDENT_AMBULATORY_CARE_PROVIDER_SITE_OTHER): Payer: Medicare Other | Admitting: Adult Health

## 2013-04-20 VITALS — BP 130/78 | HR 81 | Temp 99.3°F | Ht 67.0 in | Wt 246.0 lb

## 2013-04-20 DIAGNOSIS — R0989 Other specified symptoms and signs involving the circulatory and respiratory systems: Secondary | ICD-10-CM

## 2013-04-20 DIAGNOSIS — R05 Cough: Secondary | ICD-10-CM

## 2013-04-20 DIAGNOSIS — R0600 Dyspnea, unspecified: Secondary | ICD-10-CM

## 2013-04-20 DIAGNOSIS — R0609 Other forms of dyspnea: Secondary | ICD-10-CM

## 2013-04-20 DIAGNOSIS — R053 Chronic cough: Secondary | ICD-10-CM

## 2013-04-20 DIAGNOSIS — R059 Cough, unspecified: Secondary | ICD-10-CM

## 2013-04-20 LAB — BASIC METABOLIC PANEL
BUN: 13 mg/dL (ref 6–23)
CO2: 29 mEq/L (ref 19–32)
Calcium: 9.4 mg/dL (ref 8.4–10.5)
Chloride: 99 mEq/L (ref 96–112)
Creatinine, Ser: 0.8 mg/dL (ref 0.4–1.2)
GFR: 93.56 mL/min (ref >60.00)
Glucose, Bld: 211 mg/dL — ABNORMAL HIGH (ref 70–99)
Potassium: 4.2 mEq/L (ref 3.5–5.1)
Sodium: 137 mEq/L (ref 135–145)

## 2013-04-20 NOTE — Progress Notes (Signed)
Subjective:    Patient ID: [name], female    DOB: [birth year], 57 y.o.   MRN: [id number]  HPI OV 02/17/2011. 57 year old female. Obese. AA female. Remote smoker On cpap for OSA (pmd managing). REferred by Dr Delfina Redwood.    New visit for chronic cough. Preesent for many years. Insidious onset.  Progressively with more frequency past few years. Cough rated as moderate. Usually dry cough with associated barking quality. Occ mucus present; thick and clear. Cough worsened by milk products, change in weather and dust. Hx of prior ENT eval in [place] > 15 years ago: diagnosed as "vocal cords sticking together" and remote hx of sinus surgery Also diagnosed with GERD in [place] > 15 years ago; takes protonix.  [facility] Reflux Symptom INdex score is 39 and very c/w LPR cough(severe hoarsness of voice, clearing of throat, ecess throat mucus, post nasal drip, coughing after lying down or eating, choking epidoses, sensation of globus with food sticking in thorat) and associated heart burn. Cough so severe that she could not complete spirometry in office today  Has dyspnea too: few years, insidious onset. Progressive. Rates it as moderate. Dyspnea brought on by associated chest tightness and nasal drainage or even exertion like climbing flight of steps. Dyspnea relieved by rest. Cough and dyspnea associated with wheezing and constant yellow sinus drainage as well. Frequent nocturnal awakenings with choking present. Above resulted in asthma diagnosis versus tracheobronchiolotis > 15 years ago: on advair since then which she is unsure is helping. She herself is not convinced she has asthma.   Associated med intake shows tramadol (for shoulder pain and sicatica), fish oil/ flaxseed (2 years) and ACE inhibitor intake (been on lisniopril for > 10 years due to diabetes)   Pas med hx review fromn outside record notes "hx of asthma and vocal cord dysfunction and post nasal drip. Recalls frequent ER visits atleast one per  month (last er visit 2 months ago per hx) to [place]. Denies ICU admits, intubations for same.   REC Cough is from ACE Inhibitor, sinus drainage, vocal cord dysfunction, acid reflux and possible cough all conspiring to cause cyclical cough/LPR cough #Sinus drainage  - continue nasal steroid - refer to [facility] ENT Dr. Wilburn Cornelia or Dr Redmond Baseman #Possible Acid Reflux  - continue protonix   - take diet sheet from [place] - avoid colas, spices, cheeses, spirits, red meats, beer, chocolates, fried foods etc.,   - sleep with head end of bed elevated  - eat small frequent meals  - do not go to bed for 3 hours after last meal - stop fish oil and flaxseed for now #Asthma   - continue advair for now - might have to consider changing over to something else later depending on ENT evaluation #Vocal Cord Dysfunction - see ENT doctor first  -later  will consider referral to speech therapy with [name] #BP  - stop lisinopril - take benicar 1 tablet daily - take sample, nurse will add it in med list #Followup - I will see you in 4-6 weeks.  - Reattempt spirometry with [name] at time of followup -Important you comply with advice 100% correctly   OV 03/31/11: Followup cough. SAw ENT 89/17./12: no anatomic path or VCD noticed at time of exam though patient noted to have choked.  She states that she was not happy with her experience at ENT visit and does not want to visit the same office again. She is compliant with instructions except  for diet though she has made improvements there. Off lisniopril. Follows sinus, gerd and astham advice. Does not like advair diskus. Overall cough is much better. She is happy with improvement. RSI cough score dropped from 39 to 14.  She sses her hoarsenss, clearing and pos nasal drip as mild-moderate  PFTS show no more flow volume loop issues. There is restriction with mixed obstruction. DLCO 67%   REC Cough is from sinus drainage, vocal cord dysfunction, acid  reflux and possible cough all conspiring to cause cyclical cough/LPR cough  #Sinus drainage  - continue nasal steroid - nurse will do script for generic fluticasone -  #Acid Reflux  - continue protonix   - take diet sheet from [place] [facility] - avoid colas, spices, cheeses, spirits, red meats, beer, chocolates, fried foods etc.,   - sleep with head end of bed elevated  - eat small frequent meals  - do not go to bed for 3 hours after last meal - cotninue to avoid fish oil and flaxseed   #Asthma   - continue advair but nurse will give you sample of HFA and teach you how to take it with spacer; she will do script as well  #Vocal Cord Dysfunction - referral to speech therapy with [name]  #BP  - will add lisinopril to allergy list - take benicar 1 tablet daily or the equivalent Dr Delfina Redwood gives you  #Followup - I will see you in 8 weeks.  - -Important you comply with advice 100% correctly - Glad you are much better - Flu shot today please   OV 06/26/2011 Followup for chronic cough. She continues to do better. Subjectively feels much better compared to last visit. Although objectively her cough score has only reduced by 1 point. Today, RSI cough score is 13. Level III postnasal drip. Level to hoarseness of voice, cough after lying down, choking episodes, and annoying cough. Level I sensation of lump in throat and heartburn.  In talking to her she is compliant with her nasal steroid and her Protonix for acid reflux but not compliant with diet for acid reflux. She is on Advair HFA with spacer for presumed asthma and she feels this makes her cough less compared to Advair discus. We discussed more about her asthma history and she says that she was never sure she had asthma diagnosis in [place] it was a presumptive diagnosis. She is eager to come off Advair and give a trial without it. She also gives a history of allergies and allergy shots while living in [place] and she is open to an  allergy evaluation right now. In terms of cyclical cough she has finished speech therapy evaluation and graduated from the program. Overall she feels quality-of-life is acceptable although she would like to see cough improve further. So far we have not tried Neurontin    Past, Family, Social reviewed: no change since last visit except that Dr Deforest Hoyles is new PMD. She now states she has year round allergies. Recollects while in [place] used to take allergy shots twice a week 10 years ago. Since moving to [place] 4 years ago not seen an allergist. Laverle Hobby to see allergist. She feels she does not have asthma.  Cough is from sinus drainage, vocal cord dysfunction, acid reflux and possible asthma or allergiesh all working together to cause cyclical cough/LPR cough  #Sinus drainage  - continue nasal steroid - nurse will do script for generic fluticasone  -  #Acid Reflux  - continue protonix  -  contniue (REALLY IMPORANT IS DIET) diet sheet from [place] [facility] - avoid colas, spices, cheeses, spirits, red meats, beer, chocolates, fried foods etc.,  - sleep with head end of bed elevated  - eat small frequent meals  - do not go to bed for 3 hours after last meal  - cotninue to avoid fish oil and flaxseed  #Asthma  - since diagnosis of asthma was in doubt. Please stop Advair and just use albuterol as needed  -We'll do spirometry at followup  # allergies  - Please see Dr. Baird Lyons in our office for allergy evaluation; will do referral  #Vocal Cord Dysfunction  - congratulations on graduating from program with speech therapy [name]  #BP  - Continue losartan  #Followup  - I will see you in 8 weeks.  - -Important you comply with advice 100% correctly  - Glad you are much better  - Cough score worksheet at followup   OV 08/25/2011 Followup for chronic cough. She is off advair as advised. She is doing gerd control.  Ojectively her cough score is not reduced. Last visit score was 13 but now it is 14.  (Level III postnasal drip, and post nasal drip. . Level 2  hoarseness of voice and choking episodes. Level 1 - cough after lying down,and  sensation of lump in throat and heartburn). Howevefr, subjectively cough much improved. Still with post nasal drip. Has seen Dr Annamaria Boots for allergy 08/06/11 and 08/19/11. RAST serum profile negative except for elevated [place] IGE. Allergy skin test pending 09/16/11. On visit 08/19/11 to Dr Annamaria Boots had atypical chest pain - releived with ppi in office. EKG okay. Trop was normal. . Of note, Dr Annamaria Boots is also addressing her prior OSA issues.   ROS c/o anxiety, insomnia, also chest pains  - atypical  Spirometry  - fev1 1.74L/72%. Ratio 78    Lab 08/19/11 1721  TROPONINI <0.01    #Chest pains  - please see [facility] asap - > equivocal stress test and but clinically low probability February 2013; placed in clinical followup #Cough  - for sinus: follow Dr Annamaria Boots recommendation  - for acid reflux: follow medications and diet  - for possible asthma: stay off advair and once cardiology ok, please have methacholine challenge test  #Followup  - complete cardiology visit and methacholine challenge  - 4-6 weeks from now  09/16/11 with Dr Annamaria Boots Allergist- 54 yoF former smoker referred by Dr Chase Caller who has been seeing her for multifactorial cough, questioning an allergic component At last visit she had presented with chest pain, preventing planned allergy skin testing. Now following with cardiology and pending CT scan of her heart/Dr. Burt Knack. Still complains of nasal congestion. Able to use her CPAP more regularly when her nose is not stopped up. Unable to do methacholine challenge last month because her baseline is from a tree scores were too low. CPAP autotitration done/[facility]. Pending download. PFT- 09/11/11- FVC 2.15/68%, FEV1 1.59/ 64%, FEV1/FVC 0.80. FEF 25-75 % 0.37/ 15%   OV 03/21/2013 Followup chronic cough  - I have not seen her in nearly 15 months. She  says that basically her cough resolved but cough is no longer chronic and only occurs intermittently when she has "bronchitis". In the last year she's had antibiotics and prednisone for acute episodes of coughing at least 3 times. In between episodes she is asymptomatic. Currently she feels she's having one such episode in the past 3 weeks with chest congestion, cough with white mucus,  fatigue and associated wheeze. Currently RSI cough score is 21 and show severe cough.  - Of note, she has not had methacholine challenge test that I advised a year and half ago. She says that she does not have vocal cord dysfunction based on Dr. Mickie Hillier exam in August 2012 ENT physician >>levaquin /pred pack   04/20/2013 Follow up and Med review  new med calendar - pt brought all meds with her today.  does report some sinus pressure, congestion, SOB, wheezing, chest tightness. Is feeling better but still gets into coughing fits that takes her a while to calm down. Strong odors seem to causes attacks along with laughing  She did have an attack in the office. Sats were normal however she has significant upper airway psuedowheezing w/ barking cough.  No fever , chest pain, over reflux , orthopnea or edema.  No using CPAP lately    Review of Systems  Constitutional: Negative for fever and unexpected weight change.  HENT: Negative for ear pain, nosebleeds, congestion, sore throat, rhinorrhea, sneezing, trouble swallowing, dental problem, postnasal drip and sinus pressure.   Eyes: Negative for redness and itching.  Respiratory: Negative for cough, chest tightness, shortness of breath and wheezing.   Cardiovascular: Negative for palpitations and leg swelling.  Gastrointestinal: Negative for nausea and vomiting.  Genitourinary: Negative for dysuria.  Musculoskeletal: Negative for joint swelling.  Skin: Negative for rash.  Neurological: Negative for headaches.  Hematological: Does not bruise/bleed easily.   Psychiatric/Behavioral: Negative for dysphoric mood. The patient is not nervous/anxious.    Current outpatient prescriptions:albuterol (PROVENTIL HFA;VENTOLIN HFA) 108 (90 BASE) MCG/ACT inhaler, Inhale 2 puffs into the lungs every 6 (six) hours as needed. For shortness of breath, Disp: , Rfl: ;  ALPRAZolam (XANAX) 0.5 MG tablet, Take 0.5 mg by mouth 2 (two) times daily., Disp: , Rfl: ;  aspirin 81 MG tablet, Take 81 mg by mouth daily. , Disp: , Rfl: ;  atorvastatin (LIPITOR) 20 MG tablet, Take 20 mg by mouth daily.  , Disp: , Rfl:  fluticasone-salmeterol (ADVAIR HFA) 115-21 MCG/ACT inhaler, Inhale 2 puffs into the lungs as needed., Disp: , Rfl: ;  insulin detemir (LEVEMIR) 100 UNIT/ML injection, Inject 60 Units into the skin 2 (two) times daily. , Disp: , Rfl: ;  loratadine (CLARITIN) 10 MG tablet, Take 10 mg by mouth daily. , Disp: , Rfl: ;  losartan (COZAAR) 100 MG tablet, Take 100 mg by mouth daily.  , Disp: , Rfl:  metFORMIN (GLUCOPHAGE) 500 MG tablet, Take 1,000 mg by mouth 2 (two) times daily with a meal. , Disp: , Rfl: ;  mometasone (NASONEX) 50 MCG/ACT nasal spray, Place 2 sprays into the nose daily.  , Disp: , Rfl: ;  pantoprazole (PROTONIX) 40 MG tablet, Take 40 mg by mouth daily. , Disp: , Rfl: ;  temazepam (RESTORIL) 30 MG capsule, Take 1 capsule (30 mg total) by mouth at bedtime as needed for sleep., Disp: 30 capsule, Rfl: 1 traMADol (ULTRAM) 50 MG tablet, Take 50 mg by mouth every 6 (six) hours as needed. For pain, Disp: , Rfl: ;  VICTOZA 18 MG/3ML SOPN, Take 1 tablet by mouth daily., Disp: , Rfl: ;  vitamin D, CHOLECALCIFEROL, 400 UNITS tablet, Take 400 Units by mouth daily.  , Disp: , Rfl:      Objective:   Physical Exam Vitals reviewed. Constitutional: She is oriented to person, place, and time. She appears well-developed and well-nourished. No distress.       Morbidly  obese  HENT:  Head: Normocephalic and atraumatic.  Right Ear: External ear normal.  Left Ear: External ear  normal.  Mouth/Throat: Oropharynx is clear and moist. No oropharyngeal exudate.  Eyes: Conjunctivae and EOM are normal. Pupils are equal, round, and reactive to light. Right eye exhibits no discharge. Left eye exhibits no discharge. No scleral icterus.  Neck: Normal range of motion. Neck supple. No JVD present. No tracheal deviation present. No thyromegaly present.       mallampatti class 4  Cardiovascular: Normal rate, regular rhythm, normal heart sounds and intact distal pulses.  Exam reveals no gallop and no friction rub.   No murmur heard. Pulmonary/Chest: Effort normal and breath sounds normal. No respiratory distress. She has no wheezes. She has no rales. She exhibits no tenderness. Abdominal: Soft. Bowel sounds are normal. She exhibits no distension and no mass. There is no tenderness. There is no rebound and no guarding.  Musculoskeletal: Normal range of motion. She exhibits no edema and no tenderness.  Lymphadenopathy:    She has no cervical adenopathy.  Neurological: She is alert and oriented to person, place, and time. She has normal reflexes. No cranial nerve deficit. She exhibits normal muscle tone. Coordination normal.  Skin: Skin is warm and dry. No rash noted. She is not diaphoretic. No erythema. No pallor.  Psychiatric: She has a normal mood and affect. Her behavior is normal. Judgment and thought content normal.         Assessment & Plan:

## 2013-04-20 NOTE — Patient Instructions (Addendum)
Stop Advair . Add Chlor trimeton 4mg  2 At bedtime   Change Loratadine 10mg  in am.  Add Pepcid 20mg  At bedtime   Avoid throat clearing , use sips of water.  Saline nasal rinses Twice daily  .  NO MINT PRODUCTS  GERD diet .  We are setting you up for a CT neck .  Restart CPAP At bedtime   follow up Dr. Marchelle Gearing in 4 weeks.  Please contact office for sooner follow up if symptoms do not improve or worsen or seek emergency care

## 2013-04-24 ENCOUNTER — Ambulatory Visit (INDEPENDENT_AMBULATORY_CARE_PROVIDER_SITE_OTHER)
Admission: RE | Admit: 2013-04-24 | Discharge: 2013-04-24 | Disposition: A | Discharge status: Home / Self Care | Payer: Medicare Other | Source: Ambulatory Visit | Attending: Adult Health | Admitting: Adult Health

## 2013-04-24 DIAGNOSIS — R0600 Dyspnea, unspecified: Secondary | ICD-10-CM

## 2013-04-24 DIAGNOSIS — R0989 Other specified symptoms and signs involving the circulatory and respiratory systems: Secondary | ICD-10-CM

## 2013-04-24 DIAGNOSIS — R059 Cough, unspecified: Secondary | ICD-10-CM

## 2013-04-24 DIAGNOSIS — R05 Cough: Secondary | ICD-10-CM

## 2013-04-24 DIAGNOSIS — R053 Chronic cough: Secondary | ICD-10-CM

## 2013-04-24 DIAGNOSIS — R0609 Other forms of dyspnea: Secondary | ICD-10-CM

## 2013-04-24 MED ORDER — IOHEXOL 300 MG/ML  SOLN
76.0000 mL | Freq: Once | INTRAMUSCULAR | Status: AC | PRN
  Administered 2013-04-24: 76 mL via INTRAVENOUS

## 2013-04-25 ENCOUNTER — Encounter: Payer: Self-pay | Admitting: Adult Health

## 2013-04-26 NOTE — Progress Notes (Signed)
Quick Note:  Called spoke with patient, advised of CT results / recs as stated by TP. Pt verbalized her understanding and denied any questions. ______ 

## 2013-04-26 NOTE — Assessment & Plan Note (Signed)
Severe cyclical cough with upper airway spasm attacks  Check CT neck  Case and pt discussed /examined by Dr. Maple Hudson    Plan  Stop Advair . Add Chlor trimeton 4mg  2 At bedtime   Change Loratadine 10mg  in am.  Add Pepcid 20mg  At bedtime   Avoid throat clearing , use sips of water.  Saline nasal rinses Twice daily  .  NO MINT PRODUCTS  GERD diet .  We are setting you up for a CT neck .  Restart CPAP At bedtime   follow up Dr. Marchelle Gearing in 4 weeks.  Please contact office for sooner follow up if symptoms do not improve or worsen or seek emergency care

## 2013-05-10 NOTE — Addendum Note (Signed)
Addended by: Boone Master E on: 05/10/2013 05:15 PM   Modules accepted: Orders, Medications

## 2013-05-17 ENCOUNTER — Telehealth: Payer: Self-pay | Admitting: Internal Medicine

## 2013-05-17 NOTE — Telephone Encounter (Signed)
Forwarding to DR Maple Hudson.  I thnk came to me by ? Error  Dr. Kalman Shan, M.D., [facility].C.P Pulmonary and Critical Care Medicine Staff Physician [place] Pager: [phone], If no answer or between  15:00h - 7:00h: call [phone]  05/17/2013 10:45 PM

## 2013-05-17 NOTE — Telephone Encounter (Signed)
Last OV with Dr. Maple Hudson for OSA 09/16/11 Pt is requesting an order to be sent for a new CPAP machine. She reports she was due for a new machine anyway's. He has been "acting funny". Pt has not been seen since 09/2011 and had no pending appt. Pt was hesitant to make an appt bc she didn't feel there was a need to f/u for OSA. Pt then scheduled appt 07/04/13 but reports she is not sure yet if she will come in. Please advise Dr. Maple Hudson if okay to send order thanks

## 2013-05-18 ENCOUNTER — Other Ambulatory Visit: Payer: Self-pay

## 2013-05-18 NOTE — Telephone Encounter (Signed)
Order [facility]- replacement for worn out CPAP machine 11 cwp, mask of choice, humidifier, supplies dx OSA  Advise her to keep scheduled f/u so we can maintain the documentation required by insurance that permits [place] to get her these kinds of help with her CPAP when needed.

## 2013-05-18 NOTE — Telephone Encounter (Signed)
[name] \\[name]  Rica Records, CMA            Her insurance will allow her to get a replacement every 5 years. We would just need an office visit note that says she has been using and benefiting from her CPAP. I have submitted the last two notes in Epic for review. I'm not sure yet if her insurance will accept these or not. I'll let you know what I find out.  thanks     I called and made pt aware. She voiced her understanding. Nothing further needed

## 2013-05-18 NOTE — Telephone Encounter (Signed)
Insurance has made changed to when pt can get a new machine. i called [facility] to check and see if pt is eligible. LMTCb for Melissa. Will also send a staff message

## 2013-05-19 ENCOUNTER — Ambulatory Visit
Admission: RE | Admit: 2013-05-19 | Discharge: 2013-05-19 | Disposition: A | Discharge status: Home / Self Care | Payer: Medicare Other | Source: Ambulatory Visit | Attending: Nurse Practitioner | Admitting: Nurse Practitioner

## 2013-05-19 ENCOUNTER — Other Ambulatory Visit: Payer: Self-pay | Admitting: Nurse Practitioner

## 2013-05-19 DIAGNOSIS — J209 Acute bronchitis, unspecified: Secondary | ICD-10-CM

## 2013-05-25 ENCOUNTER — Ambulatory Visit: Payer: Medicare Other | Admitting: Internal Medicine

## 2013-05-26 ENCOUNTER — Ambulatory Visit (INDEPENDENT_AMBULATORY_CARE_PROVIDER_SITE_OTHER): Payer: Medicare Other | Admitting: Internal Medicine

## 2013-05-26 ENCOUNTER — Telehealth: Payer: Self-pay | Admitting: Internal Medicine

## 2013-05-26 ENCOUNTER — Encounter: Payer: Self-pay | Admitting: Internal Medicine

## 2013-05-26 VITALS — BP 126/80 | HR 66 | Temp 98.0°F | Ht 67.0 in | Wt 247.6 lb

## 2013-05-26 DIAGNOSIS — R059 Cough, unspecified: Secondary | ICD-10-CM

## 2013-05-26 DIAGNOSIS — G4733 Obstructive sleep apnea (adult) (pediatric): Secondary | ICD-10-CM

## 2013-05-26 DIAGNOSIS — R053 Chronic cough: Secondary | ICD-10-CM

## 2013-05-26 DIAGNOSIS — R05 Cough: Secondary | ICD-10-CM

## 2013-05-26 MED ORDER — PREDNISONE 10 MG PO TABS: ORAL_TABLET | ORAL | Status: DC

## 2013-05-26 MED ORDER — DOXYCYCLINE HYCLATE 100 MG PO TABS: 100.0000 mg | ORAL_TABLET | Freq: Two times a day (BID) | ORAL | Status: DC

## 2013-05-26 NOTE — Progress Notes (Signed)
Subjective:    Patient ID: [name], female    DOB: 01-Feb-1956, 57 y.o.   MRN: [id number]  HPI HPI OV 02/17/2011. 57 year old female. Obese. AA female. Remote smoker On cpap for OSA (pmd managing). REferred by Dr Delfina Redwood.    New visit for chronic cough. Preesent for many years. Insidious onset.  Progressively with more frequency past few years. Cough rated as moderate. Usually dry cough with associated barking quality. Occ mucus present; thick and clear. Cough worsened by milk products, change in weather and dust. Hx of prior ENT eval in [place] > 15 years ago: diagnosed as "vocal cords sticking together" and remote hx of sinus surgery Also diagnosed with GERD in [place] > 15 years ago; takes protonix.  [facility] Reflux Symptom INdex score is 39 and very c/w LPR cough(severe hoarsness of voice, clearing of throat, ecess throat mucus, post nasal drip, coughing after lying down or eating, choking epidoses, sensation of globus with food sticking in thorat) and associated heart burn. Cough so severe that she could not complete spirometry in office today  Has dyspnea too: few years, insidious onset. Progressive. Rates it as moderate. Dyspnea brought on by associated chest tightness and nasal drainage or even exertion like climbing flight of steps. Dyspnea relieved by rest. Cough and dyspnea associated with wheezing and constant yellow sinus drainage as well. Frequent nocturnal awakenings with choking present. Above resulted in asthma diagnosis versus tracheobronchiolotis > 15 years ago: on advair since then which she is unsure is helping. She herself is not convinced she has asthma.   Associated med intake shows tramadol (for shoulder pain and sicatica), fish oil/ flaxseed (2 years) and ACE inhibitor intake (been on lisniopril for > 10 years due to diabetes)   Pas med hx review fromn outside record notes "hx of asthma and vocal cord dysfunction and post nasal drip. Recalls frequent ER visits atleast one  per month (last er visit 2 months ago per hx) to [facility]. Denies ICU admits, intubations for same.   REC Cough is from ACE Inhibitor, sinus drainage, vocal cord dysfunction, acid reflux and possible cough all conspiring to cause cyclical cough/LPR cough #Sinus drainage  - continue nasal steroid - refer to [facility]NT Dr. Wilburn Cornelia or Dr Redmond Baseman #Possible Acid Reflux  - continue protonix   - take diet sheet from [place] - avoid colas, spices, cheeses, spirits, red meats, beer, chocolates, fried foods etc.,   - sleep with head end of bed elevated  - eat small frequent meals  - do not go to bed for 3 hours after last meal - stop fish oil and flaxseed for now #Asthma   - continue advair for now - might have to consider changing over to something else later depending on ENT evaluation #Vocal Cord Dysfunction - see ENT doctor first  -later  will consider referral to speech therapy with [name] #BP  - stop lisinopril - take benicar 1 tablet daily - take sample, nurse will add it in med list #Followup - I will see you in 4-6 weeks.  - Reattempt spirometry with [name] at time of followup -Important you comply with advice 100% correctly   OV 03/31/11: Followup cough. SAw ENT 89/17./12: no anatomic path or VCD noticed at time of exam though patient noted to have choked.  She states that she was not happy with her experience at ENT visit and does not want to visit the same office again. She is compliant with instructions  except for diet though she has made improvements there. Off lisniopril. Follows sinus, gerd and astham advice. Does not like advair diskus. Overall cough is much better. She is happy with improvement. RSI cough score dropped from 39 to 14.  She sses her hoarsenss, clearing and pos nasal drip as mild-moderate  PFTS show no more flow volume loop issues. There is restriction with mixed obstruction. DLCO 67%   REC Cough is from sinus drainage, vocal cord dysfunction,  acid reflux and possible cough all conspiring to cause cyclical cough/LPR cough  #Sinus drainage  - continue nasal steroid - nurse will do script for generic fluticasone -  #Acid Reflux  - continue protonix   - take diet sheet from [place] [facility] - avoid colas, spices, cheeses, spirits, red meats, beer, chocolates, fried foods etc.,   - sleep with head end of bed elevated  - eat small frequent meals  - do not go to bed for 3 hours after last meal - cotninue to avoid fish oil and flaxseed   #Asthma   - continue advair but nurse will give you sample of HFA and teach you how to take it with spacer; she will do script as well  #Vocal Cord Dysfunction - referral to speech therapy with [name]  #BP  - will add lisinopril to allergy list - take benicar 1 tablet daily or the equivalent Dr Delfina Redwood gives you  #Followup - I will see you in 8 weeks.  - -Important you comply with advice 100% correctly - Glad you are much better - Flu shot today please   OV 06/26/2011 Followup for chronic cough. She continues to do better. Subjectively feels much better compared to last visit. Although objectively her cough score has only reduced by 1 point. Today, RSI cough score is 13. Level III postnasal drip. Level to hoarseness of voice, cough after lying down, choking episodes, and annoying cough. Level I sensation of lump in throat and heartburn.  In talking to her she is compliant with her nasal steroid and her Protonix for acid reflux but not compliant with diet for acid reflux. She is on Advair HFA with spacer for presumed asthma and she feels this makes her cough less compared to Advair discus. We discussed more about her asthma history and she says that she was never sure she had asthma diagnosis in [place] it was a presumptive diagnosis. She is eager to come off Advair and give a trial without it. She also gives a history of allergies and allergy shots while living in [place] and she is open to an  allergy evaluation right now. In terms of cyclical cough she has finished speech therapy evaluation and graduated from the program. Overall she feels quality-of-life is acceptable although she would like to see cough improve further. So far we have not tried Neurontin    Past, Family, Social reviewed: no change since last visit except that Dr Deforest Hoyles is new PMD. She now states she has year round allergies. Recollects while in [place] used to take allergy shots twice a week 10 years ago. Since moving to [place] 4 years ago not seen an allergist. Laverle Hobby to see allergist. She feels she does not have asthma.  Cough is from sinus drainage, vocal cord dysfunction, acid reflux and possible asthma or allergiesh all working together to cause cyclical cough/LPR cough  #Sinus drainage  - continue nasal steroid - nurse will do script for generic fluticasone  -  #Acid Reflux  - continue  protonix  - contniue (REALLY IMPORANT IS DIET) diet sheet from [place] [facility] - avoid colas, spices, cheeses, spirits, red meats, beer, chocolates, fried foods etc.,  - sleep with head end of bed elevated  - eat small frequent meals  - do not go to bed for 3 hours after last meal  - cotninue to avoid fish oil and flaxseed  #Asthma  - since diagnosis of asthma was in doubt. Please stop Advair and just use albuterol as needed  -We'll do spirometry at followup  # allergies  - Please see Dr. Baird Lyons in our office for allergy evaluation; will do referral  #Vocal Cord Dysfunction  - congratulations on graduating from program with speech therapy [name]  #BP  - Continue losartan  #Followup  - I will see you in 8 weeks.  - -Important you comply with advice 100% correctly  - Glad you are much better  - Cough score worksheet at followup   OV 08/25/2011 Followup for chronic cough. She is off advair as advised. She is doing gerd control.  Ojectively her cough score is not reduced. Last visit score was 13 but now it is 14.  (Level III postnasal drip, and post nasal drip. . Level 2  hoarseness of voice and choking episodes. Level 1 - cough after lying down,and  sensation of lump in throat and heartburn). Howevefr, subjectively cough much improved. Still with post nasal drip. Has seen Dr Annamaria Boots for allergy 08/06/11 and 08/19/11. RAST serum profile negative except for elevated [place] IGE. Allergy skin test pending 09/16/11. On visit 08/19/11 to Dr Annamaria Boots had atypical chest pain - releived with ppi in office. EKG okay. Trop was normal. . Of note, Dr Annamaria Boots is also addressing her prior OSA issues.   ROS c/o anxiety, insomnia, also chest pains  - atypical  Spirometry  - fev1 1.74L/72%. Ratio 78    Lab 08/19/11 1721  TROPONINI <0.01    #Chest pains  - please see [place] cardiology asap - > equivocal stress test and but clinically low probability February 2013; placed in clinical followup #Cough  - for sinus: follow Dr Annamaria Boots recommendation  - for acid reflux: follow medications and diet  - for possible asthma: stay off advair and once cardiology ok, please have methacholine challenge test  #Followup  - complete cardiology visit and methacholine challenge  - 4-6 weeks from now  09/16/11 with Dr Annamaria Boots Allergist- 73 yoF former smoker referred by Dr Chase Caller who has been seeing her for multifactorial cough, questioning an allergic component At last visit she had presented with chest pain, preventing planned allergy skin testing. Now following with cardiology and pending CT scan of her heart/Dr. Burt Knack. Still complains of nasal congestion. Able to use her CPAP more regularly when her nose is not stopped up. Unable to do methacholine challenge last month because her baseline is from a tree scores were too low. CPAP autotitration done/[facility]. Pending download. PFT- 09/11/11- FVC 2.15/68%, FEV1 1.59/ 64%, FEV1/FVC 0.80. FEF 25-75 % 0.37/ 15%   OV 03/21/2013 Followup chronic cough  - I have not seen her in nearly 15 months. She  says that basically her cough resolved but cough is no longer chronic and only occurs intermittently when she has "bronchitis". In the last year she's had antibiotics and prednisone for acute episodes of coughing at least 3 times. In between episodes she is asymptomatic. Currently she feels she's having one such episode in the past 3 weeks with chest congestion, cough  with white mucus, fatigue and associated wheeze. Currently RSI cough score is 21 and show severe cough.  - Of note, she has not had methacholine challenge test that I advised a year and half ago. She says that she does not have vocal cord dysfunction based on Dr. Mickie Hillier exam in August 2012 ENT physician   REC levaquin and pred burst  04/20/2013 Follow up and Med review  new med calendar - pt brought all meds with her today.  does report some sinus pressure, congestion, SOB, wheezing, chest tightness. Is feeling better but still gets into coughing fits that takes her a while to calm down. Strong odors seem to causes attacks along with laughing  She did have an attack in the office. Sats were normal however she has significant upper airway psuedowheezing w/ barking cough.  No fever , chest pain, over reflux , orthopnea or edema.  No using CPAP lately   REC top Advair . Add Chlor trimeton '4mg'$  2 At bedtime   Change Loratadine '10mg'$  in am.  Add Pepcid '20mg'$  At bedtime   Avoid throat clearing , use sips of water.  Saline nasal rinses Twice daily  .  NO MINT PRODUCTS  GERD diet .  We are setting you up for a CT neck .  Restart CPAP At bedtime   follow up Dr. Chase Caller in 4 weeks.  Please contact office for sooner follow up if symptoms do not improve or worsen or seek emergency care    OV  05/26/2013  Chief Complaint  Patient presents with  . Follow-up    Pt c/o chest tightness, dry cough, wheezing and sob. Pt states this was some better while she was on prednisone. Last dose x 3 weeks ago.     Followup for chronic  cough. She says her last 2-3 weeks that she's having an acute bronchitis flare with yellow phlegm. She feels she needs antibiotics and prednisone. In fact in the time that she last saw me in September 2014 she's had another round of antibiotics and prednisone with her primary care physician Dr. Deforest Hoyles. She feels her chest is congested although in the office she is clearly having a barking cough, laryngeal spasm and upper airway noise intermittently with changes in voice. It is fairly significant. I reminded her that this is consistent with vocal cord dysfunction but she said that her ear nose throat surgeon cleared her for the same. Of note, she had a CT scan of the neck and this was normal.  Medications reviewed and noted. I still now that she is continuing fish oil despite [place] doing her medicine calendar for her  Investigations reviewed. CT scan of the neck results reviewed. I do note that she never had a CT scan of the chest  Past medical history reviewed changes as per history of present illness  Review of Systems  Constitutional: Negative for fever and unexpected weight change.  HENT: Negative for congestion, dental problem, ear pain, nosebleeds, postnasal drip, rhinorrhea, sinus pressure, sneezing, sore throat and trouble swallowing.   Eyes: Negative for redness and itching.  Respiratory: Positive for cough, chest tightness, shortness of breath and wheezing.   Cardiovascular: Negative for palpitations and leg swelling.  Gastrointestinal: Negative for nausea and vomiting.  Genitourinary: Negative for dysuria.  Musculoskeletal: Negative for joint swelling.  Skin: Negative for rash.  Neurological: Negative for headaches.  Hematological: Does not bruise/bleed easily.  Psychiatric/Behavioral: Negative for dysphoric mood. The patient is not nervous/anxious.    Current  outpatient prescriptions:albuterol (PROVENTIL HFA;VENTOLIN HFA) 108 (90 BASE) MCG/ACT inhaler, Inhale 2 puffs into the lungs  every 4 (four) hours as needed for wheezing or shortness of breath. , Disp: , Rfl: ;  ALPRAZolam (XANAX) 0.5 MG tablet, Take 0.5 mg by mouth 2 (two) times daily as needed. , Disp: , Rfl: ;  aspirin 81 MG tablet, Take 81 mg by mouth 2 (two) times daily. , Disp: , Rfl:  atorvastatin (LIPITOR) 20 MG tablet, Take 20 mg by mouth every evening. , Disp: , Rfl: ;  chlorpheniramine (CHLOR-TRIMETON) 4 MG tablet, 2 tabs by mouth at bedtime, Disp: , Rfl: ;  citalopram (CELEXA) 10 MG tablet, Take 1 tablet by mouth daily., Disp: , Rfl: ;  dextromethorphan (DELSYM) 30 MG/5ML liquid, 2 tsp every 12 hours as needed for cough, Disp: , Rfl: ;  famotidine (PEPCID) 20 MG tablet, Take 20 mg by mouth at bedtime., Disp: , Rfl:  gabapentin (NEURONTIN) 300 MG capsule, Take 1 capsule by mouth 2 (two) times daily., Disp: , Rfl: ;  insulin detemir (LEVEMIR) 100 UNIT/ML injection, Inject 60 Units into the skin 2 (two) times daily. , Disp: , Rfl: ;  loratadine (CLARITIN) 10 MG tablet, Take 10 mg by mouth every morning., Disp: , Rfl: ;  losartan (COZAAR) 100 MG tablet, Take 100 mg by mouth every morning. , Disp: , Rfl:  metFORMIN (GLUCOPHAGE) 1000 MG tablet, Take 1 tablet by mouth 2 (two) times daily., Disp: , Rfl: ;  mometasone (NASONEX) 50 MCG/ACT nasal spray, Place 2 sprays into the nose every morning. , Disp: , Rfl: ;  naproxen (NAPROSYN) 500 MG tablet, Take 500 mg by mouth 2 (two) times daily with a meal., Disp: , Rfl: ;  Omega-3 Fatty Acids (FISH OIL) 1000 MG CAPS, Take 1 capsule by mouth 2 (two) times daily., Disp: , Rfl:  pantoprazole (PROTONIX) 40 MG tablet, Take 40 mg by mouth daily before breakfast. , Disp: , Rfl: ;  temazepam (RESTORIL) 30 MG capsule, Take 1 capsule (30 mg total) by mouth at bedtime as needed for sleep., Disp: 30 capsule, Rfl: 1;  traMADol (ULTRAM) 50 MG tablet, Take 50 mg by mouth every 6 (six) hours as needed for pain. , Disp: , Rfl: ;  VICTOZA 18 MG/3ML SOPN, Inject 18 mg into the skin every morning. , Disp: ,  Rfl:       Objective:   Physical Exam  Vitals reviewed. Constitutional: She is oriented to person, place, and time. She appears well-developed and well-nourished. No distress.  Body mass index is 38.77 kg/(m^2).   HENT:  Head: Normocephalic and atraumatic.  Right Ear: External ear normal.  Left Ear: External ear normal.  Mouth/Throat: Oropharynx is clear and moist. No oropharyngeal exudate.  Intermittent stridor with laryngeal cough and barking quality cough. Constant clearing of the throat  Eyes: Conjunctivae and EOM are normal. Pupils are equal, round, and reactive to light. Right eye exhibits no discharge. Left eye exhibits no discharge. No scleral icterus.  Neck: Normal range of motion. Neck supple. No JVD present. No tracheal deviation present. No thyromegaly present.  Cardiovascular: Normal rate, regular rhythm, normal heart sounds and intact distal pulses.  Exam reveals no gallop and no friction rub.   No murmur heard. Pulmonary/Chest: Effort normal and breath sounds normal. No respiratory distress. She has no wheezes. She has no rales. She exhibits no tenderness.  No wheeze  Abdominal: Soft. Bowel sounds are normal. She exhibits no distension and no mass. There is no tenderness.  There is no rebound and no guarding.  Musculoskeletal: Normal range of motion. She exhibits no edema and no tenderness.  Lymphadenopathy:    She has no cervical adenopathy.  Neurological: She is alert and oriented to person, place, and time. She has normal reflexes. No cranial nerve deficit. She exhibits normal muscle tone. Coordination normal.  Skin: Skin is warm and dry. No rash noted. She is not diaphoretic. No erythema. No pallor.  Psychiatric: She has a normal mood and affect. Her behavior is normal. Judgment and thought content normal.          Assessment & Plan:

## 2013-05-26 NOTE — Patient Instructions (Addendum)
I think the major problem for your chronic cough is vocal cord dysfunction Right now the office you are displaying signs of that In case there is some acute bronchitis gone on  - Please take prednisone 40 mg x1 day, then 30 mg x1 day, then 20 mg x1 day, then 10 mg x1 day, and then 5 mg x1 day and stop  - Take doxycycline 100mg  po twice daily x 5 days; take after meals and avoid sunlight To make further evaluation at your cough  - Please stop fish oil  - Continue your other medications  - Please have CT scan of the chest without contrast     - High Resolution CT chest without contrast on ILD protocol. Only  Dr Leanna Battles or Dr. Trudie Reed to read Return to see me in 2 months or sooner if needed  - Cough score part 1 at followup

## 2013-05-26 NOTE — Telephone Encounter (Signed)
Per the phone note from 05/17/13 - it is okay per CY to order a replacement CPAP. Order will be placed. Melissa is aware.

## 2013-05-26 NOTE — Assessment & Plan Note (Signed)
I think the major problem for your chronic cough is vocal cord dysfunction Right now the office you are displaying signs of that In case there is some acute bronchitis gone on  - Please take prednisone 40 mg x1 day, then 30 mg x1 day, then 20 mg x1 day, then 10 mg x1 day, and then 5 mg x1 day and stop  - Take doxycycline 100mg  po twice daily x 5 days; take after meals and avoid sunlight To make further evaluation at your cough  - Please stop fish oil  - Continue your other medications  - Please have CT scan of the chest without contrast     - High Resolution CT chest without contrast on ILD protocol. Only  Dr Leanna Battles or Dr. Trudie Reed to read Return to see me in 2 months or sooner if needed  - Cough score part 1 at followup  > 50% of this > 25 min visit spent in face to face counseling (15 min visit converted to 25 min)

## 2013-05-30 ENCOUNTER — Telehealth: Payer: Self-pay | Admitting: Internal Medicine

## 2013-05-30 ENCOUNTER — Ambulatory Visit (INDEPENDENT_AMBULATORY_CARE_PROVIDER_SITE_OTHER)
Admission: RE | Admit: 2013-05-30 | Discharge: 2013-05-30 | Disposition: A | Discharge status: Home / Self Care | Payer: Medicare Other | Source: Ambulatory Visit | Attending: Internal Medicine | Admitting: Internal Medicine

## 2013-05-30 DIAGNOSIS — R05 Cough: Secondary | ICD-10-CM

## 2013-05-30 DIAGNOSIS — R053 Chronic cough: Secondary | ICD-10-CM

## 2013-05-30 DIAGNOSIS — T17908A Unspecified foreign body in respiratory tract, part unspecified causing other injury, initial encounter: Secondary | ICD-10-CM

## 2013-05-30 DIAGNOSIS — R059 Cough, unspecified: Secondary | ICD-10-CM

## 2013-05-30 NOTE — Telephone Encounter (Signed)
Let her know CT chest nov 2014 does not show ILD as cause of cough. However, there is some subtle suggestin she might be aspirating. Ask her if she has ever had GI workup? Or having reflux issues? IF never seen GI, refer to Dr Rhea Belton of [facility]  Dr. Kalman Shan, M.D., [facility].C.P Pulmonary and Critical Care Medicine Staff Physician [facility] Pager: [phone], If no answer or between  15:00h - 7:00h: call [phone]  05/30/2013 10:52 PM

## 2013-06-02 NOTE — Telephone Encounter (Signed)
Pt advised and order placed. Jennifer Castillo, CMA  

## 2013-06-16 ENCOUNTER — Emergency Department (HOSPITAL_COMMUNITY): Payer: Medicare Other

## 2013-06-16 ENCOUNTER — Encounter (HOSPITAL_COMMUNITY): Payer: Self-pay | Admitting: Emergency Medicine

## 2013-06-16 ENCOUNTER — Emergency Department (HOSPITAL_COMMUNITY)
Admission: EM | Admit: 2013-06-16 | Discharge: 2013-06-16 | Disposition: A | Discharge status: Home / Self Care | Payer: Medicare Other | Attending: Emergency Medicine | Admitting: Emergency Medicine

## 2013-06-16 DIAGNOSIS — I10 Essential (primary) hypertension: Secondary | ICD-10-CM | POA: Insufficient documentation

## 2013-06-16 DIAGNOSIS — IMO0002 Reserved for concepts with insufficient information to code with codable children: Secondary | ICD-10-CM | POA: Insufficient documentation

## 2013-06-16 DIAGNOSIS — F411 Generalized anxiety disorder: Secondary | ICD-10-CM | POA: Insufficient documentation

## 2013-06-16 DIAGNOSIS — E119 Type 2 diabetes mellitus without complications: Secondary | ICD-10-CM | POA: Insufficient documentation

## 2013-06-16 DIAGNOSIS — M19019 Primary osteoarthritis, unspecified shoulder: Secondary | ICD-10-CM | POA: Insufficient documentation

## 2013-06-16 DIAGNOSIS — K219 Gastro-esophageal reflux disease without esophagitis: Secondary | ICD-10-CM | POA: Insufficient documentation

## 2013-06-16 DIAGNOSIS — E7800 Pure hypercholesterolemia, unspecified: Secondary | ICD-10-CM | POA: Insufficient documentation

## 2013-06-16 DIAGNOSIS — G4730 Sleep apnea, unspecified: Secondary | ICD-10-CM | POA: Insufficient documentation

## 2013-06-16 DIAGNOSIS — Z79899 Other long term (current) drug therapy: Secondary | ICD-10-CM | POA: Insufficient documentation

## 2013-06-16 DIAGNOSIS — R011 Cardiac murmur, unspecified: Secondary | ICD-10-CM | POA: Insufficient documentation

## 2013-06-16 DIAGNOSIS — Z87891 Personal history of nicotine dependence: Secondary | ICD-10-CM | POA: Insufficient documentation

## 2013-06-16 DIAGNOSIS — J441 Chronic obstructive pulmonary disease with (acute) exacerbation: Secondary | ICD-10-CM | POA: Insufficient documentation

## 2013-06-16 DIAGNOSIS — Z7982 Long term (current) use of aspirin: Secondary | ICD-10-CM | POA: Insufficient documentation

## 2013-06-16 DIAGNOSIS — E669 Obesity, unspecified: Secondary | ICD-10-CM | POA: Insufficient documentation

## 2013-06-16 LAB — CBC WITH DIFFERENTIAL/PLATELET
Basophils Absolute: 0 10*3/uL (ref 0.0–0.1)
Basophils Relative: 0 % (ref 0–1)
Eosinophils Absolute: 0.2 10*3/uL (ref 0.0–0.7)
Eosinophils Relative: 3 % (ref 0–5)
HCT: 38.7 % (ref 36.0–46.0)
Hemoglobin: 12.4 g/dL (ref 12.0–15.0)
Lymphocytes Relative: 26 % (ref 12–46)
Lymphs Abs: 1.8 10*3/uL (ref 0.7–4.0)
MCH: 27 pg (ref 26.0–34.0)
MCHC: 32 g/dL (ref 30.0–36.0)
MCV: 84.3 fL (ref 78.0–100.0)
Monocytes Absolute: 0.5 10*3/uL (ref 0.1–1.0)
Monocytes Relative: 7 % (ref 3–12)
Neutro Abs: 4.4 10*3/uL (ref 1.7–7.7)
Neutrophils Relative %: 64 % (ref 43–77)
Platelets: 246 10*3/uL (ref 150–400)
RBC: 4.59 MIL/uL (ref 3.87–5.11)
RDW: 14.8 % (ref 11.5–15.5)
WBC: 6.9 10*3/uL (ref 4.0–10.5)

## 2013-06-16 LAB — BASIC METABOLIC PANEL
BUN: 10 mg/dL (ref 6–23)
CO2: 26 mEq/L (ref 19–32)
Calcium: 9.5 mg/dL (ref 8.4–10.5)
Chloride: 103 mEq/L (ref 96–112)
Creatinine, Ser: 0.71 mg/dL (ref 0.50–1.10)
GFR calc Af Amer: >90 mL/min (ref >90)
GFR calc non Af Amer: >90 mL/min (ref >90)
Glucose, Bld: 135 mg/dL — ABNORMAL HIGH (ref 70–99)
Potassium: 3.7 mEq/L (ref 3.5–5.1)
Sodium: 140 mEq/L (ref 135–145)

## 2013-06-16 MED ORDER — CLARITHROMYCIN 500 MG PO TABS: 500.0000 mg | ORAL_TABLET | Freq: Two times a day (BID) | ORAL | Status: DC

## 2013-06-16 MED ORDER — ALBUTEROL (5 MG/ML) CONTINUOUS INHALATION SOLN
10.0000 mg/h | INHALATION_SOLUTION | Freq: Once | RESPIRATORY_TRACT | Status: AC
  Administered 2013-06-16: 10 mg/h via RESPIRATORY_TRACT
  Filled 2013-06-16: qty 20

## 2013-06-16 MED ORDER — PREDNISONE 20 MG PO TABS: 60.0000 mg | ORAL_TABLET | Freq: Every day | ORAL | Status: DC

## 2013-06-16 MED ORDER — CLARITHROMYCIN 500 MG PO TABS
500.0000 mg | ORAL_TABLET | Freq: Two times a day (BID) | ORAL | Status: DC
  Administered 2013-06-16: 500 mg via ORAL
  Filled 2013-06-16: qty 1

## 2013-06-16 NOTE — ED Notes (Signed)
Pt from PCP c/o shortness of breath and wheezing. PCP gave 0.63mg  of xopenex neb tx and 80mg  IM depomedrol. Pt has barky cough. Pt given 5mg  of albuterol and 0.5mg  atrovent breathing tx.

## 2013-06-16 NOTE — ED Provider Notes (Signed)
CSN: [id number]     Arrival date & time 06/16/13  1516 History   First MD Initiated Contact with Patient 06/16/13 1528     Chief Complaint  Patient presents with  . Shortness of Breath   (Consider location/radiation/quality/duration/timing/severity/associated sxs/prior Treatment) Patient is a 57 y.o. female presenting with cough.  Cough Cough characteristics:  Productive Sputum characteristics:  Clear and nondescript Severity:  Severe Onset quality:  Gradual Duration:  2 days Timing:  Intermittent Chronicity:  Recurrent Smoker: former smoker.   Context: exposure to allergens, occupational exposure, upper respiratory infection and weather changes   Relieved by:  Home nebulizer Worsened by:  Lying down Ineffective treatments:  Ipratropium inhaler, steroid inhaler, home nebulizer and beta-agonist inhaler Associated symptoms: shortness of breath, sinus congestion and wheezing   Associated symptoms: no chest pain, no chills, no diaphoresis, no ear fullness, no rash and no sore throat     [name] is a 57 y.o.female with a significant PMH of hypertension, obesity, asthma, anxiety, COPD, sleep apnea, headache, allergies, chronic bronchitis, vocal cord dysfunction, exertional dyspnea, GERD, diabetes presents to the ER sent in from PCP with complaints of SOB, wheezing and choking on mucus. The patient see's a pulmonologist for her chronic cough and SOB. Currently she is awaiting a GI consult to have and endoscopy to see if GERD is the culprit for her chronic cough. She says that she started to notice she was getting bronchitis this morning and went to see her PCP to avoid having to come the hospital this weekend.   Prior to arrival in the ED she has had Xopenex, albuterol and Atrovent nebulizer treatments. She was given 80 mg Depomedrol IM.  She says that she does not feel as if she is having shortness of breath and that her increase effort of breathing is baseline for her. She says the  symptoms that she is having that are new is that she feels she is choking on mucus. She says that Biaxin usually helps when she gets these symptoms. Has not had CPAP machine for 4 months because it broke but received replacement today.  Denies fevers, nausea, diarrhea, chills, weakness, confusion, chest pains or chest tightness.    Past Medical History  Diagnosis Date  . HTN (hypertension)   . Obesity   . Asthma   . Sciatica   . Rotator cuff tear   . Anxiety   . COPD (chronic obstructive pulmonary disease)   . Sleep apnea     CPAP, sleep study on [facility]  . Headache(784.0)   . Environmental allergies     "year round"  . High cholesterol   . Cold extremity without peripheral vascular disease     bilaterally; "from my knees down into my feet; they get ice cold"  . Heart murmur   . Chronic bronchitis   . Vocal cord dysfunction     "I've had it for many years"  . Exertional dyspnea   . Type II diabetes mellitus   . GERD (gastroesophageal reflux disease)   . Arthritis     "both shoulders"  . Sciatic nerve pain     "tailbone down into my legs when it flares up"   Past Surgical History  Procedure Laterality Date  . Nasal sinus surgery  1992  . Toe surgery      for ingrown [id number] ?left  . Shoulder arthroscopy w/ rotator cuff repair  01/28/12    left  . Appendectomy  1976  . Oophorectomy  1976  w/ovarian cyst excision and appy  . Total abdominal hysterectomy  1985  . Abdominal adhesion surgery  ~ 1978   Family History  Problem Relation Age of Onset  . Heart disease Mother   . Stomach cancer Maternal Grandmother   . Asthma Son   . Asthma Daughter   . Asthma Mother    History  Substance Use Topics  . Smoking status: Former Smoker -- 1.00 packs/day for 25 years    Types: Cigarettes    Quit date: 07/13/1996  . Smokeless tobacco: Never Used  . Alcohol Use: Yes     Comment: 01/29/12 "used to abuse alcohol; last drink was in the 1980's"   OB History   Grav Para  Term Preterm Abortions TAB SAB Ect Mult Living                 Review of Systems  Constitutional: Negative for chills and diaphoresis.  HENT: Negative for sore throat.   Respiratory: Positive for cough, shortness of breath and wheezing.   Cardiovascular: Negative for chest pain.  Skin: Negative for rash.    Allergies  Lisinopril  Home Medications   Current Outpatient Rx  Name  Route  Sig  Dispense  Refill  . albuterol (PROVENTIL HFA;VENTOLIN HFA) 108 (90 BASE) MCG/ACT inhaler   Inhalation   Inhale 2 puffs into the lungs every 4 (four) hours as needed for wheezing or shortness of breath.          . ALPRAZolam (XANAX) 0.5 MG tablet   Oral   Take 0.5 mg by mouth 2 (two) times daily as needed.          Marland Kitchen aspirin 81 MG tablet   Oral   Take 81 mg by mouth 2 (two) times daily.          Marland Kitchen atorvastatin (LIPITOR) 20 MG tablet   Oral   Take 20 mg by mouth every evening.          . chlorpheniramine (CHLOR-TRIMETON) 4 MG tablet      2 tabs by mouth at bedtime         . citalopram (CELEXA) 10 MG tablet   Oral   Take 1 tablet by mouth daily.         . famotidine (PEPCID) 20 MG tablet   Oral   Take 20 mg by mouth at bedtime.         . gabapentin (NEURONTIN) 300 MG capsule   Oral   Take 1 capsule by mouth 2 (two) times daily.         . insulin detemir (LEVEMIR) 100 UNIT/ML injection   Subcutaneous   Inject 60 Units into the skin 2 (two) times daily.          Marland Kitchen loratadine (CLARITIN) 10 MG tablet   Oral   Take 10 mg by mouth every morning.         Marland Kitchen losartan (COZAAR) 100 MG tablet   Oral   Take 100 mg by mouth every morning.          . metFORMIN (GLUCOPHAGE) 1000 MG tablet   Oral   Take 1 tablet by mouth 2 (two) times daily.         . mometasone (NASONEX) 50 MCG/ACT nasal spray   Nasal   Place 2 sprays into the nose every morning.          . naproxen (NAPROSYN) 500 MG tablet   Oral   Take 500 mg by  mouth 2 (two) times daily with a  meal.         . Omega-3 Fatty Acids (FISH OIL) 1000 MG CAPS   Oral   Take 1 capsule by mouth 2 (two) times daily.         . pantoprazole (PROTONIX) 40 MG tablet   Oral   Take 40 mg by mouth daily before breakfast.          . temazepam (RESTORIL) 30 MG capsule   Oral   Take 1 capsule (30 mg total) by mouth at bedtime as needed for sleep.   30 capsule   1   . traMADol (ULTRAM) 50 MG tablet   Oral   Take 50 mg by mouth every 6 (six) hours as needed for pain.          Marland Kitchen VICTOZA 18 MG/3ML SOPN   Subcutaneous   Inject 18 mg into the skin every morning.          . clarithromycin (BIAXIN) 500 MG tablet   Oral   Take 1 tablet (500 mg total) by mouth 2 (two) times daily.   14 tablet   0   . predniSONE (DELTASONE) 20 MG tablet   Oral   Take 3 tablets (60 mg total) by mouth daily.   15 tablet   0    BP 116/65  Pulse 77  Temp(Src) 98.8 F (37.1 C) (Axillary)  Resp 16  Ht 5\' 7"  (1.702 m)  Wt 244 lb (110.678 kg)  BMI 38.21 kg/m2  SpO2 100% Physical Exam  Nursing note and vitals reviewed. Constitutional: She appears well-developed and well-nourished. No distress.  HENT:  Head: Normocephalic and atraumatic.  Eyes: Pupils are equal, round, and reactive to light.  Neck: Normal range of motion. Neck supple.  Cardiovascular: Normal rate and regular rhythm.   Pulmonary/Chest: Effort normal. She has decreased breath sounds (diffuse). She has wheezes. She has no rhonchi.  pts voice is raspy Coughing during exam Choking on mucus.  Abdominal: Soft.  Neurological: She is alert.  Skin: Skin is warm and dry.    ED Course  Procedures (including critical care time) Labs Review Labs Reviewed  BASIC METABOLIC PANEL - Abnormal; Notable for the following:    Glucose, Bld 135 (*)    All other components within normal limits  CULTURE, EXPECTORATED SPUTUM-ASSESSMENT  CBC WITH DIFFERENTIAL   Imaging Review Dg Chest 2 View  06/16/2013   CLINICAL DATA:  Wheezing, coughing,  chest pressure, congestion  EXAM: CHEST  2 VIEW  COMPARISON:  05/30/2013  FINDINGS: Cardiomediastinal silhouette is stable. No acute infiltrate or pleural effusion. No pulmonary edema. Central mild bronchitic changes.  IMPRESSION: No acute infiltrate or pulmonary edema. Central mild bronchitic changes.   Electronically Signed   By: Natasha Mead M.D.   On: 06/16/2013 16:33    EKG Interpretation    Date/Time:  Friday June 16 2013 15:31:59 EST Ventricular Rate:  77 PR Interval:  177 QRS Duration: 76 QT Interval:  386 QTC Calculation: 437 R Axis:   46 Text Interpretation:  Sinus rhythm Borderline T wave abnormalities Confirmed by WARD  DO, KRISTEN ([id number]) on 06/16/2013 3:38:44 PM            MDM   1. COPD exacerbation      Patient work-up does not show significant concerns or abnormalities. A sputum culture was obtained which will not return during patient stay. She responded very well to the hour long nebulizer and was given the  chance to be admitted or to go home. The patient adamantly wants to go home, which I am comfortable with.  Lungs now moving air very well with neb, have breathing treatments at home. Husband is here and would like to take her home as well.  Rx: biaxin and prednisone. Advised to continue using nebs at home.  57 y.o.[name]'s evaluation in the Emergency Department is complete. It has been determined that no acute conditions requiring further emergency intervention are present at this time. The patient/guardian have been advised of the diagnosis and plan. We have discussed signs and symptoms that warrant return to the ED, such as changes or worsening in symptoms.  Vital signs are stable at discharge. Filed Vitals:   06/16/13 1730  BP: 116/65  Pulse: 77  Temp:   Resp: 16    Patient/guardian has voiced understanding and agreed to follow-up with the PCP or specialist.     Dorthula Matas, PA-C 06/16/13 1813

## 2013-06-16 NOTE — ED Provider Notes (Signed)
Medical screening examination/treatment/procedure(s) were performed by non-physician practitioner and as supervising physician I was immediately available for consultation/collaboration.  EKG Interpretation    Date/Time:  Friday June 16 2013 15:31:59 EST Ventricular Rate:  77 PR Interval:  177 QRS Duration: 76 QT Interval:  386 QTC Calculation: 437 R Axis:   46 Text Interpretation:  Sinus rhythm Borderline T wave abnormalities Confirmed by WARD  DO, KRISTEN ([id number]) on 06/16/2013 3:38:44 PM              Layla Maw Ward, DO 06/16/13 2355

## 2013-06-16 NOTE — ED Notes (Signed)
Pt had episode of coughing and now c/o some chest tightness. EKG completed.

## 2013-06-16 NOTE — ED Notes (Signed)
Tiffany PA at bedside  

## 2013-06-16 NOTE — Discharge Instructions (Signed)
Chronic Obstructive Pulmonary Disease Exacerbation °Chronic obstructive pulmonary disease (COPD) is a condition that limits airflow. COPD may include chronic bronchitis, pulmonary emphysema, or both. COPD exacerbation means that your COPD has gotten worse. Without treatment, this can be a life-threatening problem. COPD exacerbation requires immediate medical care. °CAUSES  °COPD exacerbation can be caused by: °· Exposure to smoke. °· Exposure to air pollution, chemical fumes, or dust. °· Respiratory infections. °· Genetics, particularly alpha 1-antitrypsin deficiency. °· A condition in which the body's immune system attacks itself (autoimmunity). °RISK FACTORS °·  °SIGNS AND SYMPTOMS  °· Increased coughing. °· Increased wheezing. °· Increased shortness of breath. °· Swelling due to a buildup of fluid (peripheral edema) related to heart strain. °· Rapid breathing. °· Chest enlargement (barrel chest). °· Chest tightness. °DIAGNOSIS  °There is no single test that can make the diagnosis of COPD exacerbation. Your history, physical exam, and other tests will help your health care provider make a diagnosis. Tests may include a chest X-ray, pulmonary function tests, spirometry, basic lab tests, and an arterial blood gas test. °TREATMENT  °Severe problems may require a stay in the hospital. Depending on the cause of your problems, the following may be prescribed: °· Antibiotic medicines. °· Bronchodilators (inhaled or tablets). °· Cortisone medicines (inhaled or tablets). °· Supplemental oxygen therapy. °· Pulmonary rehabilitation. This is a broad program that may involve exercise, nutrition counseling, breathing techniques, and further education about your condition. °It is important to use good technique with inhaled medicines. Spacer devices may be needed to help improve drug delivery. °HOME CARE INSTRUCTIONS  °· Do not smoke. Quitting smoking is very important to prevent worsening of COPD. °· Avoid exposure to all  substances that irritate the airway, especially tobacco smoke. °· If prescribed, take your antibiotics as directed. Finish them even if you start to feel better. °· Only take over-the-counter or prescription medicines as directed by your health care provider. °· Drink enough fluids to keep your urine clear or pale yellow. This can help thin bronchial secretions. °· Use a cool mist vaporizer. This makes it easier to clear your chest when you cough. °· If you have a home nebulizer and oxygen, continue to use them as directed. °· Maintain all necessary vaccinations to prevent infections. °· Exercise regularly. °· Eat a healthy diet. °· Keep all follow-up appointments as directed by your health care provider. °SEEK IMMEDIATE MEDICAL CARE IF: °· You have extreme shortness of breath. °· You have trouble talking. °· You have severe chest pain or blood in your sputum. °· You have a high fever, weakness, repeated vomiting, or fainting. °· You feel confused. °· You keep getting worse. °MAKE SURE YOU:  °· Understand these instructions. °· Will watch your condition. °· Will get help right away if you are not doing well or get worse. °Document Released: 04/26/2007 Document Revised: 03/01/2013 Document Reviewed: 02/24/2011 °[employer]® Patient Information ©2014 [employer]. ° °

## 2013-06-19 LAB — CULTURE, RESPIRATORY W GRAM STAIN

## 2013-07-03 ENCOUNTER — Encounter: Payer: Self-pay | Admitting: Internal Medicine

## 2013-07-04 ENCOUNTER — Ambulatory Visit (INDEPENDENT_AMBULATORY_CARE_PROVIDER_SITE_OTHER): Payer: Medicare Other | Admitting: Internal Medicine

## 2013-07-04 ENCOUNTER — Encounter: Payer: Self-pay | Admitting: Internal Medicine

## 2013-07-04 VITALS — BP 130/76 | HR 67 | Ht 67.0 in | Wt 241.0 lb

## 2013-07-04 DIAGNOSIS — G4733 Obstructive sleep apnea (adult) (pediatric): Secondary | ICD-10-CM

## 2013-07-04 DIAGNOSIS — J309 Allergic rhinitis, unspecified: Secondary | ICD-10-CM

## 2013-07-04 MED ORDER — AZELASTINE-FLUTICASONE 137-50 MCG/ACT NA SUSP: 2.0000 | Freq: Every day | NASAL | Status: DC

## 2013-07-04 NOTE — Progress Notes (Signed)
Subjective:    Patient ID: [name], female    DOB: 01-Feb-1956, 57 y.o.   MRN: [id number]  HPI HPI OV 02/17/2011. 57 year old female. Obese. AA female. Remote smoker On cpap for OSA (pmd managing). REferred by Dr Delfina Redwood.    New visit for chronic cough. Preesent for many years. Insidious onset.  Progressively with more frequency past few years. Cough rated as moderate. Usually dry cough with associated barking quality. Occ mucus present; thick and clear. Cough worsened by milk products, change in weather and dust. Hx of prior ENT eval in [place] > 15 years ago: diagnosed as "vocal cords sticking together" and remote hx of sinus surgery Also diagnosed with GERD in [place] > 15 years ago; takes protonix.  [facility] Reflux Symptom INdex score is 39 and very c/w LPR cough(severe hoarsness of voice, clearing of throat, ecess throat mucus, post nasal drip, coughing after lying down or eating, choking epidoses, sensation of globus with food sticking in thorat) and associated heart burn. Cough so severe that she could not complete spirometry in office today  Has dyspnea too: few years, insidious onset. Progressive. Rates it as moderate. Dyspnea brought on by associated chest tightness and nasal drainage or even exertion like climbing flight of steps. Dyspnea relieved by rest. Cough and dyspnea associated with wheezing and constant yellow sinus drainage as well. Frequent nocturnal awakenings with choking present. Above resulted in asthma diagnosis versus tracheobronchiolotis > 15 years ago: on advair since then which she is unsure is helping. She herself is not convinced she has asthma.   Associated med intake shows tramadol (for shoulder pain and sicatica), fish oil/ flaxseed (2 years) and ACE inhibitor intake (been on lisniopril for > 10 years due to diabetes)   Pas med hx review fromn outside record notes "hx of asthma and vocal cord dysfunction and post nasal drip. Recalls frequent ER visits atleast one  per month (last er visit 2 months ago per hx) to [facility]. Denies ICU admits, intubations for same.   REC Cough is from ACE Inhibitor, sinus drainage, vocal cord dysfunction, acid reflux and possible cough all conspiring to cause cyclical cough/LPR cough #Sinus drainage  - continue nasal steroid - refer to [facility]NT Dr. Wilburn Cornelia or Dr Redmond Baseman #Possible Acid Reflux  - continue protonix   - take diet sheet from [place] - avoid colas, spices, cheeses, spirits, red meats, beer, chocolates, fried foods etc.,   - sleep with head end of bed elevated  - eat small frequent meals  - do not go to bed for 3 hours after last meal - stop fish oil and flaxseed for now #Asthma   - continue advair for now - might have to consider changing over to something else later depending on ENT evaluation #Vocal Cord Dysfunction - see ENT doctor first  -later  will consider referral to speech therapy with [name] #BP  - stop lisinopril - take benicar 1 tablet daily - take sample, nurse will add it in med list #Followup - I will see you in 4-6 weeks.  - Reattempt spirometry with [name] at time of followup -Important you comply with advice 100% correctly   OV 03/31/11: Followup cough. SAw ENT 89/17./12: no anatomic path or VCD noticed at time of exam though patient noted to have choked.  She states that she was not happy with her experience at ENT visit and does not want to visit the same office again. She is compliant with instructions  except for diet though she has made improvements there. Off lisniopril. Follows sinus, gerd and astham advice. Does not like advair diskus. Overall cough is much better. She is happy with improvement. RSI cough score dropped from 39 to 14.  She sses her hoarsenss, clearing and pos nasal drip as mild-moderate  PFTS show no more flow volume loop issues. There is restriction with mixed obstruction. DLCO 67%   REC Cough is from sinus drainage, vocal cord dysfunction,  acid reflux and possible cough all conspiring to cause cyclical cough/LPR cough  #Sinus drainage  - continue nasal steroid - nurse will do script for generic fluticasone -  #Acid Reflux  - continue protonix   - take diet sheet from [place] [facility] - avoid colas, spices, cheeses, spirits, red meats, beer, chocolates, fried foods etc.,   - sleep with head end of bed elevated  - eat small frequent meals  - do not go to bed for 3 hours after last meal - cotninue to avoid fish oil and flaxseed   #Asthma   - continue advair but nurse will give you sample of HFA and teach you how to take it with spacer; she will do script as well  #Vocal Cord Dysfunction - referral to speech therapy with [name]  #BP  - will add lisinopril to allergy list - take benicar 1 tablet daily or the equivalent Dr Delfina Redwood gives you  #Followup - I will see you in 8 weeks.  - -Important you comply with advice 100% correctly - Glad you are much better - Flu shot today please   OV 06/26/2011 Followup for chronic cough. She continues to do better. Subjectively feels much better compared to last visit. Although objectively her cough score has only reduced by 1 point. Today, RSI cough score is 13. Level III postnasal drip. Level to hoarseness of voice, cough after lying down, choking episodes, and annoying cough. Level I sensation of lump in throat and heartburn.  In talking to her she is compliant with her nasal steroid and her Protonix for acid reflux but not compliant with diet for acid reflux. She is on Advair HFA with spacer for presumed asthma and she feels this makes her cough less compared to Advair discus. We discussed more about her asthma history and she says that she was never sure she had asthma diagnosis in [place] it was a presumptive diagnosis. She is eager to come off Advair and give a trial without it. She also gives a history of allergies and allergy shots while living in [place] and she is open to an  allergy evaluation right now. In terms of cyclical cough she has finished speech therapy evaluation and graduated from the program. Overall she feels quality-of-life is acceptable although she would like to see cough improve further. So far we have not tried Neurontin    Past, Family, Social reviewed: no change since last visit except that Dr Deforest Hoyles is new PMD. She now states she has year round allergies. Recollects while in [place] used to take allergy shots twice a week 10 years ago. Since moving to [place] 4 years ago not seen an allergist. Laverle Hobby to see allergist. She feels she does not have asthma.  Cough is from sinus drainage, vocal cord dysfunction, acid reflux and possible asthma or allergiesh all working together to cause cyclical cough/LPR cough  #Sinus drainage  - continue nasal steroid - nurse will do script for generic fluticasone  -  #Acid Reflux  - continue  protonix  - contniue (REALLY IMPORANT IS DIET) diet sheet from [place] [facility] - avoid colas, spices, cheeses, spirits, red meats, beer, chocolates, fried foods etc.,  - sleep with head end of bed elevated  - eat small frequent meals  - do not go to bed for 3 hours after last meal  - cotninue to avoid fish oil and flaxseed  #Asthma  - since diagnosis of asthma was in doubt. Please stop Advair and just use albuterol as needed  -We'll do spirometry at followup  # allergies  - Please see Dr. Jetty Duhamel in our office for allergy evaluation; will do referral  #Vocal Cord Dysfunction  - congratulations on graduating from program with speech therapy Mr Verdie Mosher  #BP  - Continue losartan  #Followup  - I will see you in 8 weeks.  - -Important you comply with advice 100% correctly  - Glad you are much better  - Cough score worksheet at followup   OV 08/25/2011 Followup for chronic cough. She is off advair as advised. She is doing gerd control.  Ojectively her cough score is not reduced. Last visit score was 13 but now it is 14.  (Level III postnasal drip, and post nasal drip. . Level 2  hoarseness of voice and choking episodes. Level 1 - cough after lying down,and  sensation of lump in throat and heartburn). Howevefr, subjectively cough much improved. Still with post nasal drip. Has seen Dr Maple Hudson for allergy 08/06/11 and 08/19/11. RAST serum profile negative except for elevated [place] IGE. Allergy skin test pending 09/16/11. On visit 08/19/11 to Dr Maple Hudson had atypical chest pain - releived with ppi in office. EKG okay. Trop was normal. . Of note, Dr Maple Hudson is also addressing her prior OSA issues.   ROS c/o anxiety, insomnia, also chest pains  - atypical  Spirometry  - fev1 1.74L/72%. Ratio 78    Lab 08/19/11 1721  TROPONINI <0.01    #Chest pains  - please see [facility] asap - > equivocal stress test and but clinically low probability February 2013; placed in clinical followup #Cough  - for sinus: follow Dr Maple Hudson recommendation  - for acid reflux: follow medications and diet  - for possible asthma: stay off advair and once cardiology ok, please have methacholine challenge test  #Followup  - complete cardiology visit and methacholine challenge  - 4-6 weeks from now  09/16/11 with Dr Maple Hudson Allergist- 46 yoF former smoker referred by Dr Marchelle Gearing who has been seeing her for multifactorial cough, questioning an allergic component At last visit she had presented with chest pain, preventing planned allergy skin testing. Now following with cardiology and pending CT scan of her heart/Dr. Excell Seltzer. Still complains of nasal congestion. Able to use her CPAP more regularly when her nose is not stopped up. Unable to do methacholine challenge last month because her baseline is from a tree scores were too low. CPAP autotitration done/[facility]. Pending download. PFT- 09/11/11- FVC 2.15/68%, FEV1 1.59/ 64%, FEV1/FVC 0.80. FEF 25-75 % 0.37/ 15%   OV 03/21/2013 Followup chronic cough  - I have not seen her in nearly 15 months. She  says that basically her cough resolved but cough is no longer chronic and only occurs intermittently when she has "bronchitis". In the last year she's had antibiotics and prednisone for acute episodes of coughing at least 3 times. In between episodes she is asymptomatic. Currently she feels she's having one such episode in the past 3 weeks with chest congestion, cough  with white mucus, fatigue and associated wheeze. Currently RSI cough score is 21 and show severe cough.  - Of note, she has not had methacholine challenge test that I advised a year and half ago. She says that she does not have vocal cord dysfunction based on Dr. Myrtis Hopping exam in August 2012 ENT physician REC levaquin and pred burst  04/20/2013 Follow up and Med review  new med calendar - pt brought all meds with her today.  does report some sinus pressure, congestion, SOB, wheezing, chest tightness. Is feeling better but still gets into coughing fits that takes her a while to calm down. Strong odors seem to causes attacks along with laughing  She did have an attack in the office. Sats were normal however she has significant upper airway psuedowheezing w/ barking cough.  No fever , chest pain, over reflux , orthopnea or edema.  No using CPAP lately   REC top Advair . Add Chlor trimeton 4mg  2 At bedtime   Change Loratadine 10mg  in am.  Add Pepcid 20mg  At bedtime   Avoid throat clearing , use sips of water.  Saline nasal rinses Twice daily  .  NO MINT PRODUCTS  GERD diet .  We are setting you up for a CT neck .  Restart CPAP At bedtime   follow up Dr. Marchelle Gearing in 4 weeks.  Please contact office for sooner follow up if symptoms do not improve or worsen or seek emergency care   07/04/13- Acute OV- Dr Maple Hudson   Hx OSA/ CPAP, complicated by hx chronic bronchitis, stridor, GERD Follows for- just recieved new cpap 11/ [facility], no complaints with it.  Still sleeping in recliner, cannot lie flat at night.  CPAP is a big help  "like heaven". Wants to try higher pressure. Snoring through at times. Notices stridor, comes and goes, chronic. 2 different ENT physicians both said "normal anatomy". She is aware of reflux with chronic proton X. Once daily. Throat feels swollen. Allergy profile 07/2011 negative. Allergy skin test was positive for grass, weed, tree, mold. She was previously on allergy vaccine for 5 years in [place].  ROS-see HPI Constitutional:   No-   weight loss, night sweats, fevers, chills, +fatigue, lassitude. HEENT:   No-  headaches, difficulty swallowing, tooth/dental problems, sore throat,       No-  sneezing, itching, ear ache, nasal congestion, post nasal drip,  CV:  No-   chest pain, orthopnea, PND, swelling in lower extremities, anasarca,                                  dizziness, palpitations Resp: No-   shortness of breath with exertion or at rest.              No-   productive cough,  No non-productive cough,  No- coughing up of blood.              No-   change in color of mucus.  No- wheezing.   Skin: No-   rash or lesions. GI:  No-   heartburn, indigestion, abdominal pain, nausea, vomiting, GU:  MS:  No-   joint pain or swelling.  . Neuro-     nothing unusual Psych:  No- change in mood or affect. No depression or anxiety.  No memory loss.  OBJ- Physical Exam General- Alert, Oriented, Affect-appropriate, Distress- none acute Skin- rash-none, lesions- none, excoriation- none Lymphadenopathy- none  Head- atraumatic            Eyes- Gross vision intact, PERRLA, conjunctivae and secretions clear            Ears- Hearing, canals-normal            Nose- +thick white mucus, no-Septal dev,  polyps, erosion, perforation             Throat- Mallampati III , mucosa clear , drainage- none, tonsils- atrophic. + marked Raspy                         Stridor, + course Neck- flexible , trachea midline,  thyroid nl, carotid no bruit Chest - symmetrical excursion , unlabored           Heart/CV- RRR , no  murmur , no gallop  , no rub, nl s1 s2                           - JVD- none , edema- none, stasis changes- none, varices- none           Lung- clear to P&A, wheeze- none, cough- none , dullness-none, rub- none           Chest wall-  Abd-  Br/ Gen/ Rectal- Not done, not indicated Extrem- cyanosis- none, clubbing, none, atrophy- none, strength- nl Neuro- grossly intact to observation       Assessment & Plan:

## 2013-07-04 NOTE — Patient Instructions (Signed)
Order- [facility]- increase CPAP to 12   Dx OSA  Sample Dymista nasal spray      1-2 puffs each nostril once daily at bedtime     Try this instead of Nasacort for now

## 2013-07-10 ENCOUNTER — Encounter: Payer: Self-pay | Admitting: Internal Medicine

## 2013-07-10 ENCOUNTER — Ambulatory Visit (INDEPENDENT_AMBULATORY_CARE_PROVIDER_SITE_OTHER): Payer: Medicare Other | Admitting: Internal Medicine

## 2013-07-10 VITALS — BP 108/66 | HR 76 | Ht 66.5 in | Wt 240.2 lb

## 2013-07-10 DIAGNOSIS — K219 Gastro-esophageal reflux disease without esophagitis: Secondary | ICD-10-CM

## 2013-07-10 DIAGNOSIS — K5909 Other constipation: Secondary | ICD-10-CM

## 2013-07-10 DIAGNOSIS — K5900 Constipation, unspecified: Secondary | ICD-10-CM

## 2013-07-10 MED ORDER — LINACLOTIDE 145 MCG PO CAPS: 145.0000 ug | ORAL_CAPSULE | Freq: Every day | ORAL | Status: DC

## 2013-07-10 NOTE — Progress Notes (Signed)
Patient ID: [name], female   DOB: 1955/12/13, 57 y.o.   MRN: [id number] HPI: [name] is a 57 year old female with a past medical history of hypertension, asthma, COPD, sleep apnea on CPAP, type 2 diabetes, GERD who is seen in consultation at the request of Dr. Donette Larry to evaluate reflux and possible aspiration. The patient is here alone today. She reports she feels well. She reports a long-standing history of heartburn which is now well-controlled pantoprazole 40 mg daily. Occasionally she does have regurgitation of particles of food and fluid and this can happen during the day or at night. She reports normal swallowing without dysphagia or odynophagia. Good appetite with no nausea or vomiting. He does report constipation which is a long-standing problem for her and this is associated with abdominal bloating. She uses magnesium citrate on a fairly regular basis, once or twice weekly and occasional glycerin suppositories. Without laxative she reports hard stool to be difficult to pass. She reports she has had a colonoscopy performed in 2013 by Dr. Laural Benes. This was reportedly normal. She recalls a previous upper GI series and small bowel follow-through 20-25 years ago. She denies previous aspiration pneumonia, but has needed steroids and antibiotics on occasion for chronic bronchitis.  She recently had a CT of her chest and neck  Past Medical History  Diagnosis Date  . HTN (hypertension)   . Obesity   . Asthma   . Sciatica   . Rotator cuff tear   . Anxiety   . COPD (chronic obstructive pulmonary disease)   . Sleep apnea     CPAP, sleep study on [facility]  . Headache(784.0)   . Environmental allergies     "year round"  . High cholesterol   . Cold extremity without peripheral vascular disease     bilaterally; "from my knees down into my feet; they get ice cold"  . Heart murmur   . Chronic bronchitis   . Vocal cord dysfunction     "I've had it for many years"  . Exertional dyspnea   . Type  II diabetes mellitus   . GERD (gastroesophageal reflux disease)   . Arthritis     "both shoulders"  . Sciatic nerve pain     "tailbone down into my legs when it flares up"    Past Surgical History  Procedure Laterality Date  . Nasal sinus surgery  1992  . Toe surgery      for ingrown [id number] ?left  . Shoulder arthroscopy w/ rotator cuff repair  01/28/12    left  . Appendectomy  1976  . Oophorectomy  1976    w/ovarian cyst excision and appy  . Total abdominal hysterectomy  1985  . Abdominal adhesion surgery  ~ 1978    Current Outpatient Prescriptions  Medication Sig Dispense Refill  . albuterol (PROVENTIL HFA;VENTOLIN HFA) 108 (90 BASE) MCG/ACT inhaler Inhale 2 puffs into the lungs every 4 (four) hours as needed for wheezing or shortness of breath.       . ALPRAZolam (XANAX) 0.5 MG tablet Take 0.5 mg by mouth 2 (two) times daily as needed.       Marland Kitchen aspirin 81 MG tablet Take 81 mg by mouth 2 (two) times daily.       Marland Kitchen atorvastatin (LIPITOR) 20 MG tablet Take 20 mg by mouth every evening.       . Azelastine-Fluticasone (DYMISTA) 137-50 MCG/ACT SUSP Place 2 sprays into both nostrils at bedtime.  1 Bottle  0  . chlorpheniramine (  CHLOR-TRIMETON) 4 MG tablet 2 tabs by mouth at bedtime      . citalopram (CELEXA) 10 MG tablet Take 1 tablet by mouth daily.      Marland Kitchen gabapentin (NEURONTIN) 300 MG capsule Take 1 capsule by mouth 2 (two) times daily.      . insulin detemir (LEVEMIR) 100 UNIT/ML injection Inject 60 Units into the skin 2 (two) times daily.       Marland Kitchen loratadine (CLARITIN) 10 MG tablet Take 10 mg by mouth every morning.      Marland Kitchen losartan (COZAAR) 100 MG tablet Take 100 mg by mouth every morning.       . metFORMIN (GLUCOPHAGE) 1000 MG tablet Take 1 tablet by mouth 2 (two) times daily.      . naproxen (NAPROSYN) 500 MG tablet Take 500 mg by mouth 2 (two) times daily with a meal.      . pantoprazole (PROTONIX) 40 MG tablet Take 40 mg by mouth daily before breakfast.       . temazepam  (RESTORIL) 30 MG capsule Take 1 capsule (30 mg total) by mouth at bedtime as needed for sleep.  30 capsule  1  . traMADol (ULTRAM) 50 MG tablet Take 50 mg by mouth every 6 (six) hours as needed for pain.       Marland Kitchen VICTOZA 18 MG/3ML SOPN Inject 18 mg into the skin every morning.       . famotidine (PEPCID) 20 MG tablet Take 20 mg by mouth at bedtime.      . Linaclotide (LINZESS) 145 MCG CAPS capsule Take 1 capsule (145 mcg total) by mouth daily.  30 capsule  6  . mometasone (NASONEX) 50 MCG/ACT nasal spray Place 2 sprays into the nose every morning.        No current facility-administered medications for this visit.    Allergies  Allergen Reactions  . Lisinopril Cough    Family History  Problem Relation Age of Onset  . Heart disease Mother   . Heart failure Maternal Grandmother   . Asthma Son   . Asthma Daughter   . Asthma Mother   . Lupus Daughter     History  Substance Use Topics  . Smoking status: Former Smoker -- 1.00 packs/day for 25 years    Types: Cigarettes    Quit date: 07/13/1996  . Smokeless tobacco: Never Used  . Alcohol Use: Yes     Comment: 01/29/12 "used to abuse alcohol; last drink was in the 1980's"    ROS: As per history of present illness, otherwise negative  BP 108/66  Pulse 76  Ht 5' 6.5" (1.689 m)  Wt 240 lb 4 oz (108.977 kg)  BMI 38.20 kg/m2 Constitutional: Well-developed and well-nourished. No distress. HEENT: Normocephalic and atraumatic. Oropharynx is clear and moist. No oropharyngeal exudate. Conjunctivae are normal.  No scleral icterus. Neck: Neck supple. Trachea midline. Cardiovascular: Normal rate, regular rhythm and intact distal pulses. No M/R/G Pulmonary/chest: Distant breath sounds with mild end expiratory wheeze, no rhonchi Abdominal: Soft, obese, nontender, nondistended. Bowel sounds active throughout.  Extremities: no clubbing, cyanosis, or edema Neurological: Alert and oriented to person place and time. Skin: Skin is warm and dry.  No rashes noted. Psychiatric: Normal mood and affect. Behavior is normal.  RELEVANT LABS AND IMAGING: CBC    Component Value Date/Time   WBC 6.9 06/16/2013 1531   RBC 4.59 06/16/2013 1531   HGB 12.4 06/16/2013 1531   HCT 38.7 06/16/2013 1531   PLT 246 06/16/2013  1531   MCV 84.3 06/16/2013 1531   MCH 27.0 06/16/2013 1531   MCHC 32.0 06/16/2013 1531   RDW 14.8 06/16/2013 1531   LYMPHSABS 1.8 06/16/2013 1531   MONOABS 0.5 06/16/2013 1531   EOSABS 0.2 06/16/2013 1531   BASOSABS 0.0 06/16/2013 1531    CMP     Component Value Date/Time   NA 140 06/16/2013 1531   K 3.7 06/16/2013 1531   CL 103 06/16/2013 1531   CO2 26 06/16/2013 1531   GLUCOSE 135* 06/16/2013 1531   BUN 10 06/16/2013 1531   CREATININE 0.71 06/16/2013 1531   CALCIUM 9.5 06/16/2013 1531   PROT 7.4 10/01/2009 2149   ALBUMIN 3.9 10/01/2009 2149   AST 26 10/01/2009 2149   ALT 27 10/01/2009 2149   ALKPHOS 74 10/01/2009 2149   BILITOT 0.4 10/01/2009 2149   GFRNONAA >90 06/16/2013 1531   GFRAA >90 06/16/2013 1531   CT NECK WITH CONTRAST   TECHNIQUE: Multidetector CT imaging of the neck was performed using the standard protocol following the bolus administration of intravenous contrast.   CONTRAST:  76mL OMNIPAQUE IOHEXOL 300 MG/ML  SOLN   COMPARISON:  None.   FINDINGS: The visualized intracranial contents are normal. Visualized paranasal sinuses are clear. The skull base appears normal. Cervical disc degeneration and spondylosis C4-5 and C5-6. No acute bony abnormality.   Parotid and submandibular glands are normal bilaterally. The tongue is normal. Tonsils are symmetric and normal. Epiglottis and larynx are normal. Thyroid is normal. Lung apices are clear.   No pathologic adenopathy in the neck. Bilateral 7 mm level 2 nodes. Negative for mass lesion.   IMPRESSION: No significant abnormality. CT CHEST WITHOUT CONTRAST   TECHNIQUE: Multidetector CT imaging of the chest was performed following the standard protocol without  IV contrast.   COMPARISON:  No priors.   FINDINGS: Mediastinum: Heart size is normal. There is no significant pericardial fluid, thickening or pericardial calcification. No pathologically enlarged mediastinal or hilar lymph nodes. Please note that accurate exclusion of hilar adenopathy is limited on noncontrast CT scans. Esophagus is unremarkable in appearance.   Lungs/Pleura: There is a very subtle pattern of patchy areas of ground-glass attenuation throughout the posterior aspect the lower lobes of the lungs bilaterally. No frank subpleural reticulation, parenchymal banding, traction bronchiectasis or honeycombing. No acute consolidative airspace disease. No definite suspicious appearing pulmonary nodules or masses. No pleural effusions. Inspiratory and expiratory imaging suggests mild air trapping, indicative of small airways disease.   Upper Abdomen: Unremarkable.   Musculoskeletal: There are no aggressive appearing lytic or blastic lesions noted in the visualized portions of the skeleton.   IMPRESSION: 1. No definite findings strongly suggestive of an interstitial lung disease at this time. 2. There is a pattern of subtle ill-defined areas of ground-glass attenuation in the posterior aspect of the lower lobes the lungs bilaterally. This is highly nonspecific, but could be related to recurrent episodes of aspiration. Clinical correlation is suggested. 3. Evidence of mild air trapping, indicative of small airways disease.     ASSESSMENT/PLAN: 57 year old female with a past medical history of hypertension, asthma, COPD, sleep apnea on CPAP, type 2 diabetes, GERD who is seen in consultation at the request of Dr. Donette Larry to evaluate reflux and possible aspiration.  1. GERD/? Aspiration -- the patient does have a long history of GERD which is now well-controlled with once daily pantoprazole. She is not having alarm symptoms such as dysphagia, odynophagia or weight loss.  We  discussed GERD hygiene and  a GERD diet. I have recommended that she not lie down within 2 hours of eating or drinking. She will continue pantoprazole 40 mg daily. Recommend a barium swallow with tablet to evaluate swallowing and esophageal function.    2.   Chronic constipation -- I recommended Linzess 145 mcg daily. We discussed that diarrhea is the main possible side effect. If this occurs, asked that she notify me. She voices understanding. I will see her back in 8-12 weeks to assess her response to this medication  3.  CRC screening -- she reports she had a normal colonoscopy in 2013 with Dr. Laural Benes, thus she is up-to-date

## 2013-07-10 NOTE — Patient Instructions (Signed)
You have been scheduled for a barium Swallow with Tablet  at [facility] (1st floor of the hospital) on 07/12/2013 at 9:30. Please arrive at 9:15 for registration. Make certain not to have anything to eat or drink 4 hours prior to the test. If you need to reschedule for any reason, please contact radiology @ [phone] to do so.  We have sent the following medications to your pharmacy for you to pick up at your convenience: Linzess

## 2013-07-12 ENCOUNTER — Ambulatory Visit (HOSPITAL_COMMUNITY)
Admission: RE | Admit: 2013-07-12 | Discharge: 2013-07-12 | Disposition: A | Discharge status: Home / Self Care | Payer: Medicare Other | Source: Ambulatory Visit | Attending: Internal Medicine | Admitting: Internal Medicine

## 2013-07-12 DIAGNOSIS — K449 Diaphragmatic hernia without obstruction or gangrene: Secondary | ICD-10-CM | POA: Insufficient documentation

## 2013-07-12 DIAGNOSIS — J4489 Other specified chronic obstructive pulmonary disease: Secondary | ICD-10-CM | POA: Insufficient documentation

## 2013-07-12 DIAGNOSIS — J383 Other diseases of vocal cords: Secondary | ICD-10-CM | POA: Insufficient documentation

## 2013-07-12 DIAGNOSIS — K219 Gastro-esophageal reflux disease without esophagitis: Secondary | ICD-10-CM | POA: Insufficient documentation

## 2013-07-12 DIAGNOSIS — J449 Chronic obstructive pulmonary disease, unspecified: Secondary | ICD-10-CM | POA: Insufficient documentation

## 2013-07-16 ENCOUNTER — Encounter: Payer: Self-pay | Admitting: Internal Medicine

## 2013-07-26 ENCOUNTER — Ambulatory Visit (INDEPENDENT_AMBULATORY_CARE_PROVIDER_SITE_OTHER): Payer: Medicare HMO | Admitting: Adult Health

## 2013-07-26 ENCOUNTER — Encounter: Payer: Self-pay | Admitting: Adult Health

## 2013-07-26 VITALS — BP 134/66 | HR 65 | Temp 98.9°F | Ht 67.0 in | Wt 244.4 lb

## 2013-07-26 DIAGNOSIS — R059 Cough, unspecified: Secondary | ICD-10-CM

## 2013-07-26 DIAGNOSIS — R05 Cough: Secondary | ICD-10-CM

## 2013-07-26 DIAGNOSIS — R053 Chronic cough: Secondary | ICD-10-CM

## 2013-07-26 MED ORDER — OLMESARTAN MEDOXOMIL 40 MG PO TABS: 40.0000 mg | ORAL_TABLET | Freq: Every day | ORAL | Status: DC

## 2013-07-26 NOTE — Patient Instructions (Addendum)
Restart Chlor trimeton 4mg  2 At bedtime   Restart  Loratadine 10mg  in am.  Avoid throat clearing , use sips of water.  Saline nasal rinses Twice daily  .  Saline gel to nasal passages At bedtime   Stop Losartan /Cozaar.  Begin Benicar 40mg  daily  NO MINT PRODUCTS  GERD diet .  Great job using your  CPAP.  Follow up Dr. Marchelle Gearingamaswamy in 4 weeks.  Please contact office for sooner follow up if symptoms do not improve or worsen or seek emergency care

## 2013-07-26 NOTE — Assessment & Plan Note (Addendum)
Upper airway cough syndrome previously on ACE  W/ Extensive workup essentially unrevealing  Neg CT chest , neg,  ENT evaluation  GI eval w/ esophagram no delayed emptying.  >tx triggers  Trial off Cozaar, recent literature suggestive that cozaar can contribute to ongoing cough. Will give trial of Benicar   Plan  Restart Chlor trimeton 4mg  2 At bedtime   Restart  Loratadine 10mg  in am.  Avoid throat clearing , use sips of water.  Saline nasal rinses Twice daily  .  Saline gel to nasal passages At bedtime   Stop Losartan /Cozaar.  Begin Benicar 40mg  daily  NO MINT PRODUCTS  GERD diet .  Great job using your  CPAP.  Follow up Dr. Marchelle Gearingamaswamy in 4 weeks.  Please contact office for sooner follow up if symptoms do not improve or worsen or seek emergency care

## 2013-07-26 NOTE — Progress Notes (Signed)
Subjective:    Patient ID: [name], female    DOB: [birth year], 58 y.o.   MRN: [id number]  HPI HPI OV 02/17/2011. 58 year old female. Obese. AA female. Remote smoker On cpap for OSA (pmd managing). REferred by Dr Delfina Redwood.    New visit for chronic cough. Preesent for many years. Insidious onset.  Progressively with more frequency past few years. Cough rated as moderate. Usually dry cough with associated barking quality. Occ mucus present; thick and clear. Cough worsened by milk products, change in weather and dust. Hx of prior ENT eval in [place] > 15 years ago: diagnosed as "vocal cords sticking together" and remote hx of sinus surgery Also diagnosed with GERD in [place] > 15 years ago; takes protonix.  [facility] Reflux Symptom INdex score is 39 and very c/w LPR cough(severe hoarsness of voice, clearing of throat, ecess throat mucus, post nasal drip, coughing after lying down or eating, choking epidoses, sensation of globus with food sticking in thorat) and associated heart burn. Cough so severe that she could not complete spirometry in office today  Has dyspnea too: few years, insidious onset. Progressive. Rates it as moderate. Dyspnea brought on by associated chest tightness and nasal drainage or even exertion like climbing flight of steps. Dyspnea relieved by rest. Cough and dyspnea associated with wheezing and constant yellow sinus drainage as well. Frequent nocturnal awakenings with choking present. Above resulted in asthma diagnosis versus tracheobronchiolotis > 15 years ago: on advair since then which she is unsure is helping. She herself is not convinced she has asthma.   Associated med intake shows tramadol (for shoulder pain and sicatica), fish oil/ flaxseed (2 years) and ACE inhibitor intake (been on lisniopril for > 10 years due to diabetes)   [facility] med hx review fromn outside record notes "hx of asthma and vocal cord dysfunction and post nasal drip. Recalls frequent ER visits atleast one  per month (last er visit 2 months ago per hx) to [place]. Denies ICU admits, intubations for same.   REC Cough is from ACE Inhibitor, sinus drainage, vocal cord dysfunction, acid reflux and possible cough all conspiring to cause cyclical cough/LPR cough #Sinus drainage  - continue nasal steroid - refer to [facility] ENT Dr. Wilburn Cornelia or Dr Redmond Baseman #Possible Acid Reflux  - continue protonix   - take diet sheet from [place] - avoid colas, spices, cheeses, spirits, red meats, beer, chocolates, fried foods etc.,   - sleep with head end of bed elevated  - eat small frequent meals  - do not go to bed for 3 hours after last meal - stop fish oil and flaxseed for now #Asthma   - continue advair for now - might have to consider changing over to something else later depending on ENT evaluation #Vocal Cord Dysfunction - see ENT doctor first  -later  will consider referral to speech therapy with [name] #BP  - stop lisinopril - take benicar 1 tablet daily - take sample, nurse will add it in med list #Followup - I will see you in 4-6 weeks.  - Reattempt spirometry with [name] at time of followup -Important you comply with advice 100% correctly   OV 03/31/11: Followup cough. SAw ENT 89/17./12: no anatomic path or VCD noticed at time of exam though patient noted to have choked.  She states that she was not happy with her experience at ENT visit and does not want to visit the same office again. She is compliant with instructions  except for diet though she has made improvements there. Off lisniopril. Follows sinus, gerd and astham advice. Does not like advair diskus. Overall cough is much better. She is happy with improvement. RSI cough score dropped from 39 to 14.  She sses her hoarsenss, clearing and pos nasal drip as mild-moderate  PFTS show no more flow volume loop issues. There is restriction with mixed obstruction. DLCO 67%   REC Cough is from sinus drainage, vocal cord dysfunction,  acid reflux and possible cough all conspiring to cause cyclical cough/LPR cough  #Sinus drainage  - continue nasal steroid - nurse will do script for generic fluticasone -  #Acid Reflux  - continue protonix   - take diet sheet from [place] [facility] - avoid colas, spices, cheeses, spirits, red meats, beer, chocolates, fried foods etc.,   - sleep with head end of bed elevated  - eat small frequent meals  - do not go to bed for 3 hours after last meal - cotninue to avoid fish oil and flaxseed   #Asthma   - continue advair but nurse will give you sample of HFA and teach you how to take it with spacer; she will do script as well  #Vocal Cord Dysfunction - referral to speech therapy with [name]  #BP  - will add lisinopril to allergy list - take benicar 1 tablet daily or the equivalent Dr Delfina Redwood gives you  #Followup - I will see you in 8 weeks.  - -Important you comply with advice 100% correctly - Glad you are much better - Flu shot today please   OV 06/26/2011 Followup for chronic cough. She continues to do better. Subjectively feels much better compared to last visit. Although objectively her cough score has only reduced by 1 point. Today, RSI cough score is 13. Level III postnasal drip. Level to hoarseness of voice, cough after lying down, choking episodes, and annoying cough. Level I sensation of lump in throat and heartburn.  In talking to her she is compliant with her nasal steroid and her Protonix for acid reflux but not compliant with diet for acid reflux. She is on Advair HFA with spacer for presumed asthma and she feels this makes her cough less compared to Advair discus. We discussed more about her asthma history and she says that she was never sure she had asthma diagnosis in [place] it was a presumptive diagnosis. She is eager to come off Advair and give a trial without it. She also gives a history of allergies and allergy shots while living in [place] and she is open to an  allergy evaluation right now. In terms of cyclical cough she has finished speech therapy evaluation and graduated from the program. Overall she feels quality-of-life is acceptable although she would like to see cough improve further. So far we have not tried Neurontin    Past, Family, Social reviewed: no change since last visit except that Dr Deforest Hoyles is new PMD. She now states she has year round allergies. Recollects while in [place] used to take allergy shots twice a week 10 years ago. Since moving to [place] 4 years ago not seen an allergist. Laverle Hobby to see allergist. She feels she does not have asthma.  Cough is from sinus drainage, vocal cord dysfunction, acid reflux and possible asthma or allergiesh all working together to cause cyclical cough/LPR cough  #Sinus drainage  - continue nasal steroid - nurse will do script for generic fluticasone  -  #Acid Reflux  - continue  protonix  - contniue (REALLY IMPORANT IS DIET) diet sheet from [place] [facility] - avoid colas, spices, cheeses, spirits, red meats, beer, chocolates, fried foods etc.,  - sleep with head end of bed elevated  - eat small frequent meals  - do not go to bed for 3 hours after last meal  - cotninue to avoid fish oil and flaxseed  #Asthma  - since diagnosis of asthma was in doubt. Please stop Advair and just use albuterol as needed  -We'll do spirometry at followup  # allergies  - Please see Dr. Baird Lyons in our office for allergy evaluation; will do referral  #Vocal Cord Dysfunction  - congratulations on graduating from program with speech therapy [name]  #BP  - Continue losartan  #Followup  - I will see you in 8 weeks.  - -Important you comply with advice 100% correctly  - Glad you are much better  - Cough score worksheet at followup   OV 08/25/2011 Followup for chronic cough. She is off advair as advised. She is doing gerd control.  Ojectively her cough score is not reduced. Last visit score was 13 but now it is 14.  (Level III postnasal drip, and post nasal drip. . Level 2  hoarseness of voice and choking episodes. Level 1 - cough after lying down,and  sensation of lump in throat and heartburn). Howevefr, subjectively cough much improved. Still with post nasal drip. Has seen Dr Annamaria Boots for allergy 08/06/11 and 08/19/11. RAST serum profile negative except for elevated [place] IGE. Allergy skin test pending 09/16/11. On visit 08/19/11 to Dr Annamaria Boots had atypical chest pain - releived with ppi in office. EKG okay. Trop was normal. . Of note, Dr Annamaria Boots is also addressing her prior OSA issues.   ROS c/o anxiety, insomnia, also chest pains  - atypical  Spirometry  - fev1 1.74L/72%. Ratio 78    Lab 08/19/11 1721  TROPONINI <0.01    #Chest pains  - please see [facility] cardiology asap - > equivocal stress test and but clinically low probability February 2013; placed in clinical followup #Cough  - for sinus: follow Dr Annamaria Boots recommendation  - for acid reflux: follow medications and diet  - for possible asthma: stay off advair and once cardiology ok, please have methacholine challenge test  #Followup  - complete cardiology visit and methacholine challenge  - 4-6 weeks from now  09/16/11 with Dr Annamaria Boots Allergist- 79 yoF former smoker referred by Dr Chase Caller who has been seeing her for multifactorial cough, questioning an allergic component At last visit she had presented with chest pain, preventing planned allergy skin testing. Now following with cardiology and pending CT scan of her heart/Dr. Burt Knack. Still complains of nasal congestion. Able to use her CPAP more regularly when her nose is not stopped up. Unable to do methacholine challenge last month because her baseline is from a tree scores were too low. CPAP autotitration done/[facility]. Pending download. PFT- 09/11/11- FVC 2.15/68%, FEV1 1.59/ 64%, FEV1/FVC 0.80. FEF 25-75 % 0.37/ 15%   OV 03/21/2013 Followup chronic cough  - I have not seen her in nearly 15 months. She  says that basically her cough resolved but cough is no longer chronic and only occurs intermittently when she has "bronchitis". In the last year she's had antibiotics and prednisone for acute episodes of coughing at least 3 times. In between episodes she is asymptomatic. Currently she feels she's having one such episode in the past 3 weeks with chest congestion, cough  with white mucus, fatigue and associated wheeze. Currently RSI cough score is 21 and show severe cough.  - Of note, she has not had methacholine challenge test that I advised a year and half ago. She says that she does not have vocal cord dysfunction based on Dr. Mickie Hillier exam in August 2012 ENT physician   REC levaquin and pred burst  04/20/2013 Follow up and Med review  new med calendar - pt brought all meds with her today.  does report some sinus pressure, congestion, SOB, wheezing, chest tightness. Is feeling better but still gets into coughing fits that takes her a while to calm down. Strong odors seem to causes attacks along with laughing  She did have an attack in the office. Sats were normal however she has significant upper airway psuedowheezing w/ barking cough.  No fever , chest pain, over reflux , orthopnea or edema.  No using CPAP lately   REC top Advair . Add Chlor trimeton '4mg'$  2 At bedtime   Change Loratadine '10mg'$  in am.  Add Pepcid '20mg'$  At bedtime   Avoid throat clearing , use sips of water.  Saline nasal rinses Twice daily  .  NO MINT PRODUCTS  GERD diet .  We are setting you up for a CT neck .  Restart CPAP At bedtime   follow up Dr. Chase Caller in 4 weeks.  Please contact office for sooner follow up if symptoms do not improve or worsen or seek emergency care    OV  05/26/2013 Chief Complaint  Patient presents with  . Follow-up    Pt c/o chest tightness, dry cough, wheezing and sob. Pt states this was some better while she was on prednisone. Last dose x 3 weeks ago.   Followup for chronic cough.  She says her last 2-3 weeks that she's having an acute bronchitis flare with yellow phlegm. She feels she needs antibiotics and prednisone. In fact in the time that she last saw me in September 2014 she's had another round of antibiotics and prednisone with her primary care physician Dr. Deforest Hoyles. She feels her chest is congested although in the office she is clearly having a barking cough, laryngeal spasm and upper airway noise intermittently with changes in voice. It is fairly significant. I reminded her that this is consistent with vocal cord dysfunction but she said that her ear nose throat surgeon cleared her for the same. Of note, she had a CT scan of the neck and this was normal.  07/26/2013 Follow up  2 month follow up - reports some  congestion, head congestion w/ light yellow mucus that is on/off. Has sinus drainage daily . Is not using claritin or chlortrimeton as recommended.  Was taken off ACE in past , now on Cozaar.   RSI cough score unchanged at 21  Had Esophagram on 1/8 w/ mild GERD/HH. No sign delay emptying .  No chest pain, orthopnea, edema , fever.  +hoarseness on/off.   She says she feels so much better with CPAP , using during daytime for naps and At bedtime  . Is amazed at how much she feels better.     Review of Systems  Constitutional: Negative for fever and unexpected weight change.  HENT: Negative for congestion, dental problem, ear pain, nosebleeds, postnasal drip, rhinorrhea, sinus pressure, sneezing, sore throat and trouble swallowing.   Eyes: Negative for redness and itching.  Respiratory: Positive for cough,  wheezing.   Cardiovascular: Negative for palpitations and leg swelling.  Gastrointestinal:  Negative for nausea and vomiting.  Genitourinary: Negative for dysuria.  Musculoskeletal: Negative for joint swelling.  Skin: Negative for rash.  Neurological: Negative for headaches.  Hematological: Does not bruise/bleed easily.  Psychiatric/Behavioral: Negative  for dysphoric mood. The patient is not nervous/anxious.        Objective:   Physical Exam  GEN: A/Ox3; pleasant , NAD, obese   HEENT:  [place]/AT,  EACs-clear, TMs-wnl, NOSE-clear drainage , THROAT-clear, no lesions, no postnasal drip or exudate noted. Class 2-3 airway. Very thickened tongue.  Mild hoarseness  NECK:  Supple w/ fair ROM; no JVD; normal carotid impulses w/o bruits; no thyromegaly or nodules palpated; no lymphadenopathy.No stridor, but psuedowheezing intermittently ,   RESP  Clear  P & A; w/o, wheezes/ rales/ or rhonchi.no accessory muscle use, no dullness to percussion  CARD:  RRR, no m/r/g  , no peripheral edema, pulses intact, no cyanosis or clubbing.  GI:   Soft & nt; nml bowel sounds; no organomegaly or masses detected.  Musco: Warm bil, no deformities or joint swelling noted.   Neuro: alert, no focal deficits noted.    Skin: Warm, no lesions or rashes        Assessment & Plan:

## 2013-07-30 NOTE — Assessment & Plan Note (Signed)
Thick mucus in nose. We discussed her uncertain improvement with allergy vaccine while in [place] several years ago. Plan-sample Dymista nasal spray

## 2013-07-30 NOTE — Assessment & Plan Note (Signed)
Good compliance and control. She would like to try pressure increase from 11-12.

## 2013-08-29 ENCOUNTER — Ambulatory Visit: Payer: Medicare HMO | Admitting: Internal Medicine

## 2013-11-15 ENCOUNTER — Other Ambulatory Visit: Payer: Self-pay

## 2013-11-15 DIAGNOSIS — Z1231 Encounter for screening mammogram for malignant neoplasm of breast: Secondary | ICD-10-CM

## 2013-11-28 ENCOUNTER — Ambulatory Visit
Admission: RE | Admit: 2013-11-28 | Discharge: 2013-11-28 | Disposition: A | Discharge status: Home / Self Care | Payer: Commercial Managed Care - HMO | Source: Ambulatory Visit

## 2013-11-28 DIAGNOSIS — Z1231 Encounter for screening mammogram for malignant neoplasm of breast: Secondary | ICD-10-CM

## 2014-03-13 ENCOUNTER — Ambulatory Visit: Payer: Medicare HMO | Admitting: *Deleted

## 2014-03-20 ENCOUNTER — Ambulatory Visit (INDEPENDENT_AMBULATORY_CARE_PROVIDER_SITE_OTHER): Payer: Commercial Managed Care - HMO | Admitting: Internal Medicine

## 2014-03-20 ENCOUNTER — Encounter: Payer: Self-pay | Admitting: Internal Medicine

## 2014-03-20 VITALS — BP 120/62 | HR 74 | Ht 66.0 in | Wt 227.0 lb

## 2014-03-20 DIAGNOSIS — R05 Cough: Secondary | ICD-10-CM

## 2014-03-20 DIAGNOSIS — R059 Cough, unspecified: Secondary | ICD-10-CM

## 2014-03-20 DIAGNOSIS — R053 Chronic cough: Secondary | ICD-10-CM

## 2014-03-20 MED ORDER — PREDNISONE 10 MG PO TABS: ORAL_TABLET | ORAL | Status: DC

## 2014-03-20 NOTE — Progress Notes (Signed)
Subjective:    Patient ID: [name], female    DOB: Jun 05, 1956, 58 y.o.   MRN: [id number]  HPI  OV 02/17/2011. 58 year old female. Obese. AA female. Remote smoker On cpap for OSA (pmd managing). REferred by Dr Delfina Redwood.    New visit for chronic cough. Preesent for many years. Insidious onset.  Progressively with more frequency past few years. Cough rated as moderate. Usually dry cough with associated barking quality. Occ mucus present; thick and clear. Cough worsened by milk products, change in weather and dust. Hx of prior ENT eval in [place] > 15 years ago: diagnosed as "vocal cords sticking together" and remote hx of sinus surgery Also diagnosed with GERD in [place] > 15 years ago; takes protonix.  [facility] Reflux Symptom INdex score is 39 and very c/w LPR cough(severe hoarsness of voice, clearing of throat, ecess throat mucus, post nasal drip, coughing after lying down or eating, choking epidoses, sensation of globus with food sticking in thorat) and associated heart burn. Cough so severe that she could not complete spirometry in office today  Has dyspnea too: few years, insidious onset. Progressive. Rates it as moderate. Dyspnea brought on by associated chest tightness and nasal drainage or even exertion like climbing flight of steps. Dyspnea relieved by rest. Cough and dyspnea associated with wheezing and constant yellow sinus drainage as well. Frequent nocturnal awakenings with choking present. Above resulted in asthma diagnosis versus tracheobronchiolotis > 15 years ago: on advair since then which she is unsure is helping. She herself is not convinced she has asthma.   Associated med intake shows tramadol (for shoulder pain and sicatica), fish oil/ flaxseed (2 years) and ACE inhibitor intake (been on lisniopril for > 10 years due to diabetes)   Pas med hx review fromn outside record notes "hx of asthma and vocal cord dysfunction and post nasal drip. Recalls frequent ER visits atleast one  per month (last er visit 2 months ago per hx) to [facility]. Denies ICU admits, intubations for same.   REC Cough is from ACE Inhibitor, sinus drainage, vocal cord dysfunction, acid reflux and possible cough all conspiring to cause cyclical cough/LPR cough #Sinus drainage  - continue nasal steroid - refer to [facility] ENT Dr. Wilburn Cornelia or Dr Redmond Baseman #Possible Acid Reflux  - continue protonix   - take diet sheet from [place] - avoid colas, spices, cheeses, spirits, red meats, beer, chocolates, fried foods etc.,   - sleep with head end of bed elevated  - eat small frequent meals  - do not go to bed for 3 hours after last meal - stop fish oil and flaxseed for now #Asthma   - continue advair for now - might have to consider changing over to something else later depending on ENT evaluation #Vocal Cord Dysfunction - see ENT doctor first  -later  will consider referral to speech therapy with [name] #BP  - stop lisinopril - take benicar 1 tablet daily - take sample, nurse will add it in med list #Followup - I will see you in 4-6 weeks.  - Reattempt spirometry with [name] at time of followup -Important you comply with advice 100% correctly   OV 03/31/11: Followup cough. SAw ENT 89/17./12: no anatomic path or VCD noticed at time of exam though patient noted to have choked.  She states that she was not happy with her experience at ENT visit and does not want to visit the same office again. She is compliant with  instructions except for diet though she has made improvements there. Off lisniopril. Follows sinus, gerd and astham advice. Does not like advair diskus. Overall cough is much better. She is happy with improvement. RSI cough score dropped from 39 to 14.  She sses her hoarsenss, clearing and pos nasal drip as mild-moderate  PFTS show no more flow volume loop issues. There is restriction with mixed obstruction. DLCO 67%   REC Cough is from sinus drainage, vocal cord dysfunction,  acid reflux and possible cough all conspiring to cause cyclical cough/LPR cough  #Sinus drainage  - continue nasal steroid - nurse will do script for generic fluticasone -  #Acid Reflux  - continue protonix   - take diet sheet from [place] [facility] - avoid colas, spices, cheeses, spirits, red meats, beer, chocolates, fried foods etc.,   - sleep with head end of bed elevated  - eat small frequent meals  - do not go to bed for 3 hours after last meal - cotninue to avoid fish oil and flaxseed   #Asthma   - continue advair but nurse will give you sample of HFA and teach you how to take it with spacer; she will do script as well  #Vocal Cord Dysfunction - referral to speech therapy with [name]  #BP  - will add lisinopril to allergy list - take benicar 1 tablet daily or the equivalent Dr Delfina Redwood gives you  #Followup - I will see you in 8 weeks.  - -Important you comply with advice 100% correctly - Glad you are much better - Flu shot today please   OV 06/26/2011 Followup for chronic cough. She continues to do better. Subjectively feels much better compared to last visit. Although objectively her cough score has only reduced by 1 point. Today, RSI cough score is 13. Level III postnasal drip. Level to hoarseness of voice, cough after lying down, choking episodes, and annoying cough. Level I sensation of lump in throat and heartburn.  In talking to her she is compliant with her nasal steroid and her Protonix for acid reflux but not compliant with diet for acid reflux. She is on Advair HFA with spacer for presumed asthma and she feels this makes her cough less compared to Advair discus. We discussed more about her asthma history and she says that she was never sure she had asthma diagnosis in [place] it was a presumptive diagnosis. She is eager to come off Advair and give a trial without it. She also gives a history of allergies and allergy shots while living in [place] and she is open to an  allergy evaluation right now. In terms of cyclical cough she has finished speech therapy evaluation and graduated from the program. Overall she feels quality-of-life is acceptable although she would like to see cough improve further. So far we have not tried Neurontin    Past, Family, Social reviewed: no change since last visit except that Dr Deforest Hoyles is new PMD. She now states she has year round allergies. Recollects while in [place] used to take allergy shots twice a week 10 years ago. Since moving to [place] 4 years ago not seen an allergist. Laverle Hobby to see allergist. She feels she does not have asthma.  Cough is from sinus drainage, vocal cord dysfunction, acid reflux and possible asthma or allergiesh all working together to cause cyclical cough/LPR cough  #Sinus drainage  - continue nasal steroid - nurse will do script for generic fluticasone  -  #Acid Reflux  -  continue protonix  - contniue (REALLY IMPORANT IS DIET) diet sheet from [place] [facility] - avoid colas, spices, cheeses, spirits, red meats, beer, chocolates, fried foods etc.,  - sleep with head end of bed elevated  - eat small frequent meals  - do not go to bed for 3 hours after last meal  - cotninue to avoid fish oil and flaxseed  #Asthma  - since diagnosis of asthma was in doubt. Please stop Advair and just use albuterol as needed  -We'll do spirometry at followup  # allergies  - Please see Dr. Baird Lyons in our office for allergy evaluation; will do referral  #Vocal Cord Dysfunction  - congratulations on graduating from program with speech therapy [name]  #BP  - Continue losartan  #Followup  - I will see you in 8 weeks.  - -Important you comply with advice 100% correctly  - Glad you are much better  - Cough score worksheet at followup   OV 08/25/2011 Followup for chronic cough. She is off advair as advised. She is doing gerd control.  Ojectively her cough score is not reduced. Last visit score was 13 but now it is 14.  (Level III postnasal drip, and post nasal drip. . Level 2  hoarseness of voice and choking episodes. Level 1 - cough after lying down,and  sensation of lump in throat and heartburn). Howevefr, subjectively cough much improved. Still with post nasal drip. Has seen Dr Annamaria Boots for allergy 08/06/11 and 08/19/11. RAST serum profile negative except for elevated [place] IGE. Allergy skin test pending 09/16/11. On visit 08/19/11 to Dr Annamaria Boots had atypical chest pain - releived with ppi in office. EKG okay. Trop was normal. . Of note, Dr Annamaria Boots is also addressing her prior OSA issues.   ROS c/o anxiety, insomnia, also chest pains  - atypical  Spirometry  - fev1 1.74L/72%. Ratio 78    #Chest pains  - please see [facility] cardiology asap - > equivocal stress test and but clinically low probability February 2013; placed in clinical followup #Cough  - for sinus: follow Dr Annamaria Boots recommendation  - for acid reflux: follow medications and diet  - for possible asthma: stay off advair and once cardiology ok, please have methacholine challenge test  #Followup  - complete cardiology visit and methacholine challenge  - 4-6 weeks from now  09/16/11 with Dr Annamaria Boots Allergist- 30 yoF former smoker referred by Dr Chase Caller who has been seeing her for multifactorial cough, questioning an allergic component At last visit she had presented with chest pain, preventing planned allergy skin testing. Now following with cardiology and pending CT scan of her heart/Dr. Burt Knack. Still complains of nasal congestion. Able to use her CPAP more regularly when her nose is not stopped up. Unable to do methacholine challenge last month because her baseline is from a tree scores were too low. CPAP autotitration done/[facility]. Pending download. PFT- 09/11/11- FVC 2.15/68%, FEV1 1.59/ 64%, FEV1/FVC 0.80. FEF 25-75 % 0.37/ 15%   OV 03/21/2013 Followup chronic cough  - I have not seen her in nearly 15 months. She says that basically her cough resolved but  cough is no longer chronic and only occurs intermittently when she has "bronchitis". In the last year she's had antibiotics and prednisone for acute episodes of coughing at least 3 times. In between episodes she is asymptomatic. Currently she feels she's having one such episode in the past 3 weeks with chest congestion, cough with white mucus, fatigue and associated wheeze. Currently  RSI cough score is 21 and show severe cough.  - Of note, she has not had methacholine challenge test that I advised a year and half ago. She says that she does not have vocal cord dysfunction based on Dr. Mickie Hillier exam in August 2012 ENT physician   REC levaquin and pred burst  04/20/2013 Follow up and Med review  new med calendar - pt brought all meds with her today.  does report some sinus pressure, congestion, SOB, wheezing, chest tightness. Is feeling better but still gets into coughing fits that takes her a while to calm down. Strong odors seem to causes attacks along with laughing  She did have an attack in the office. Sats were normal however she has significant upper airway psuedowheezing w/ barking cough.  No fever , chest pain, over reflux , orthopnea or edema.  No using CPAP lately   REC top Advair . Add Chlor trimeton 92m 2 At bedtime   Change Loratadine 161min am.  Add Pepcid 2099mt bedtime   Avoid throat clearing , use sips of water.  Saline nasal rinses Twice daily  .  NO MINT PRODUCTS  GERD diet .  We are setting you up for a CT neck .  Restart CPAP At bedtime   follow up Dr. RamChase Caller 4 weeks.  Please contact office for sooner follow up if symptoms do not improve or worsen or seek emergency care    OV  05/26/2013 Chief Complaint  Patient presents with  . Follow-up    Pt c/o chest tightness, dry cough, wheezing and sob. Pt states this was some better while she was on prednisone. Last dose x 3 weeks ago.   Followup for chronic cough. She says her last 2-3 weeks that she's having  an acute bronchitis flare with yellow phlegm. She feels she needs antibiotics and prednisone. In fact in the time that she last saw me in September 2014 she's had another round of antibiotics and prednisone with her primary care physician Dr. HusDeforest Hoyleshe feels her chest is congested although in the office she is clearly having a barking cough, laryngeal spasm and upper airway noise intermittently with changes in voice. It is fairly significant. I reminded her that this is consistent with vocal cord dysfunction but she said that her ear nose throat surgeon cleared her for the same. Of note, she had a CT scan of the neck and this was normal.  07/26/2013 Follow up  2 month follow up - reports some  congestion, head congestion w/ light yellow mucus that is on/off. Has sinus drainage daily . Is not using claritin or chlortrimeton as recommended.  Was taken off ACE in past , now on Cozaar.   RSI cough score unchanged at 21  Had Esophagram on 1/8 w/ mild GERD/HH. No sign delay emptying .  No chest pain, orthopnea, edema , fever.  +hoarseness on/off.   She says she feels so much better with CPAP , using during daytime for naps and At bedtime  . Is amazed at how much she feels better.    OV 03/20/2014  Chief Complaint  Patient presents with  . Follow-up    Pt states she is unable to hold her voice as long when she sings. Pt c/o PND and prod cough, pt thinks its d/t allergies. Pt c/o chest soreness on right upper chest.    Followup chronic cough  - I have not seen the patient in nearly 10 months. Overall cough  was stable but recently she has had a flareup. I'm not so sure that she is followed instructions compliantly. It took me a while to understand the medication regimen but it appears that she takes dymista for  Sinuses but saline wash as needed, and not taking claritin, Advair as needed for her lungs, Protonix schedule followup acid reflux but Pepcid only as needed. She is only somewhat compliant with  the Neurontin. She continues to have sinus drainage, clearing of the throat and the laryngeal/barking cough that she feels is coming from the throat. This is associated with dyspnea and is of moderate severity, and some wheezing but she feels wheeze is upper airway  Past, Family, Social reviewed: dtr with SLE, husband with cancer, mother with ESRD and she is over whelmed  Review of Systems  Constitutional: Negative for fever and unexpected weight change.  HENT: Positive for postnasal drip. Negative for congestion, dental problem, ear pain, nosebleeds, rhinorrhea, sinus pressure, sneezing, sore throat and trouble swallowing.   Eyes: Negative for redness and itching.  Respiratory: Positive for cough and shortness of breath. Negative for chest tightness and wheezing.   Cardiovascular: Positive for chest pain. Negative for palpitations and leg swelling.  Gastrointestinal: Negative for nausea and vomiting.  Genitourinary: Negative for dysuria.  Musculoskeletal: Negative for joint swelling.  Skin: Negative for rash.  Neurological: Negative for headaches.  Hematological: Does not bruise/bleed easily.  Psychiatric/Behavioral: Negative for dysphoric mood. The patient is not nervous/anxious.    Current outpatient prescriptions:albuterol (PROVENTIL HFA;VENTOLIN HFA) 108 (90 BASE) MCG/ACT inhaler, Inhale 2 puffs into the lungs every 4 (four) hours as needed for wheezing or shortness of breath. , Disp: , Rfl: ;  ALPRAZolam (XANAX) 0.5 MG tablet, Take 0.5 mg by mouth 2 (two) times daily as needed. , Disp: , Rfl: ;  aspirin 81 MG tablet, Take 81 mg by mouth 2 (two) times daily. , Disp: , Rfl:  atorvastatin (LIPITOR) 20 MG tablet, Take 20 mg by mouth every evening. , Disp: , Rfl: ;  Azelastine-Fluticasone (DYMISTA) 137-50 MCG/ACT SUSP, Place 2 sprays into both nostrils at bedtime., Disp: 1 Bottle, Rfl: 0;  chlorpheniramine (CHLOR-TRIMETON) 4 MG tablet, 2 tabs by mouth at bedtime, Disp: , Rfl: ;  citalopram  (CELEXA) 10 MG tablet, Take 1 tablet by mouth daily., Disp: , Rfl:  cyclobenzaprine (FLEXERIL) 10 MG tablet, Take 1 tablet by mouth as needed., Disp: , Rfl: ;  famotidine (PEPCID) 20 MG tablet, Take 20 mg by mouth at bedtime., Disp: , Rfl: ;  gabapentin (NEURONTIN) 300 MG capsule, Take 1 capsule by mouth 2 (two) times daily., Disp: , Rfl: ;  HUMULIN N KWIKPEN 100 UNIT/ML Kiwkpen, Inject 30 Units into the skin 2 (two) times daily., Disp: , Rfl:  losartan (COZAAR) 100 MG tablet, Take 1 tablet by mouth daily., Disp: , Rfl: ;  metFORMIN (GLUCOPHAGE) 1000 MG tablet, Take 1 tablet by mouth 2 (two) times daily., Disp: , Rfl: ;  mometasone (NASONEX) 50 MCG/ACT nasal spray, Place 2 sprays into the nose every morning. , Disp: , Rfl: ;  naproxen (NAPROSYN) 500 MG tablet, Take 500 mg by mouth 2 (two) times daily with a meal., Disp: , Rfl:  pantoprazole (PROTONIX) 40 MG tablet, Take 40 mg by mouth daily before breakfast. , Disp: , Rfl: ;  temazepam (RESTORIL) 30 MG capsule, Take 1 capsule (30 mg total) by mouth at bedtime as needed for sleep., Disp: 30 capsule, Rfl: 1;  traMADol (ULTRAM) 50 MG tablet, Take 50 mg by  mouth every 6 (six) hours as needed for pain. , Disp: , Rfl:  insulin detemir (LEVEMIR) 100 UNIT/ML injection, Inject 60 Units into the skin 2 (two) times daily. , Disp: , Rfl: ;  loratadine (CLARITIN) 10 MG tablet, Take 10 mg by mouth every morning., Disp: , Rfl: ;  olmesartan (BENICAR) 40 MG tablet, Take 1 tablet (40 mg total) by mouth daily., Disp: , Rfl: ;  VICTOZA 18 MG/3ML SOPN, Inject 18 mg into the skin every morning. , Disp: , Rfl:      Objective:   Physical Exam  Vitals reviewed. Constitutional: She is oriented to person, place, and time. She appears well-developed and well-nourished. No distress.  Body mass index is 36.66 kg/(m^2).   HENT:  Head: Normocephalic and atraumatic.  Right Ear: External ear normal.  Left Ear: External ear normal.  Mouth/Throat: Oropharynx is clear and moist. No  oropharyngeal exudate.  Laryngeal cough Clearing of throat   Eyes: Conjunctivae and EOM are normal. Pupils are equal, round, and reactive to light. Right eye exhibits no discharge. Left eye exhibits no discharge. No scleral icterus.  Neck: Normal range of motion. Neck supple. No JVD present. No tracheal deviation present. No thyromegaly present.  Cardiovascular: Normal rate, regular rhythm, normal heart sounds and intact distal pulses.  Exam reveals no gallop and no friction rub.   No murmur heard. Pulmonary/Chest: Effort normal and breath sounds normal. No respiratory distress. She has no wheezes. She has no rales. She exhibits no tenderness.  Abdominal: Soft. Bowel sounds are normal. She exhibits no distension and no mass. There is no tenderness. There is no rebound and no guarding.  Musculoskeletal: Normal range of motion. She exhibits no edema and no tenderness.  Lymphadenopathy:    She has no cervical adenopathy.  Neurological: She is alert and oriented to person, place, and time. She has normal reflexes. No cranial nerve deficit. She exhibits normal muscle tone. Coordination normal.  Skin: Skin is warm and dry. No rash noted. She is not diaphoretic. No erythema. No pallor.  Psychiatric: She has a normal mood and affect. Her behavior is normal. Judgment and thought content normal.    Filed Vitals:   03/20/14 1350  BP: 120/62  Pulse: 74  Height: 5' 6"$  (1.676 m)  Weight: 227 lb (102.967 kg)  SpO2: 98%         Assessment & Plan:  #Chronic cough  - I thnk cough is related to voice box issues and loss of neural control of the same - For your flare up: Please take prednisone 40 mg x1 day, then 30 mg x1 day, then 20 mg x1 day, then 10 mg x1 day, and then 5 mg x1 day and stop - refer Dr Ernestine Conrad of [facility] for chronic cough - for now continue current nasal, acid reflux and neurontin therapies - continue albuterol as needed ; for now hold off scheduled advair -  repeat HRCT chest wo contrast for chronic cough  #FOllowup  3 months - after you see Dr Joya Gaskins of [facility]  Come sooner if there are problems or call \

## 2014-03-20 NOTE — Patient Instructions (Addendum)
#  Chronic cough  - I thnk cough is related to voice box issues and loss of neural control of the same - Please take prednisone 40 mg x1 day, then 30 mg x1 day, then 20 mg x1 day, then 10 mg x1 day, and then 5 mg x1 day and stop - refer Dr Barnie Alderman of [facility] for chronic cough - for now continue current nasal, acid reflux and neurontin therapies - continue albuterol as needed ; for now hold off scheduled advair - repeat HRCT chest wo contrast for chronic cough  #FOllowup  3 months - after you see Dr Delford Field of [facility]  Come sooner if there are problems or call

## 2014-03-21 NOTE — Assessment & Plan Note (Signed)
Chronic cough  - I thnk cough is related to voice box issues and loss of neural control of the same - For your flare up: Please take prednisone 40 mg x1 day, then 30 mg x1 day, then 20 mg x1 day, then 10 mg x1 day, and then 5 mg x1 day and stop - refer Dr Barnie Alderman of [facility] for chronic cough - for now continue current nasal, acid reflux and neurontin therapies - continue albuterol as needed ; for now hold off scheduled advair - repeat HRCT chest wo contrast for chronic cough  #FOllowup  3 months - after you see Dr Delford Field of [facility]  Come sooner if there are problems or call

## 2014-03-26 ENCOUNTER — Ambulatory Visit (INDEPENDENT_AMBULATORY_CARE_PROVIDER_SITE_OTHER)
Admission: RE | Admit: 2014-03-26 | Discharge: 2014-03-26 | Disposition: A | Discharge status: Home / Self Care | Payer: Commercial Managed Care - HMO | Source: Ambulatory Visit | Attending: Internal Medicine | Admitting: Internal Medicine

## 2014-03-26 DIAGNOSIS — R059 Cough, unspecified: Secondary | ICD-10-CM

## 2014-03-26 DIAGNOSIS — R053 Chronic cough: Secondary | ICD-10-CM

## 2014-03-26 DIAGNOSIS — R05 Cough: Secondary | ICD-10-CM

## 2014-03-27 ENCOUNTER — Telehealth: Payer: Self-pay | Admitting: Internal Medicine

## 2014-03-27 DIAGNOSIS — J690 Pneumonitis due to inhalation of food and vomit: Secondary | ICD-10-CM

## 2014-03-27 NOTE — Telephone Encounter (Signed)
CT chest 03/26/14 shows worsening infiltreats in bases compaed to nov 2014. Let patient know that radiologist is concerned she might be aspirating and this might be contributing to to cough. Refer GI as well at [facility]. ROV per recent office note  Thanks  Dr. Kalman Shan, M.D., [facility].C.P Pulmonary and Critical Care Medicine Staff Physician [facility] Pager: [phone], If no answer or between  15:00h - 7:00h: call [phone]  03/27/2014 1:12 PM    CT chest 03/26/14 Unusual pattern of peribronchovascular ground-glass attenuation  in the lung bases bilaterally. This appears slightly progressive  compared to the prior examination. This could represent an early  manifestation of interstitial lung disease such as nonspecific  interstitial pneumonia (NSIP), and may relate to recurrent episodes  of aspiration. Clinical correlation is recommended.  Electronically Signed  By: Trudie Reed M.D.  On: 03/26/2014 11:46

## 2014-03-27 NOTE — Telephone Encounter (Signed)
Called and spoke to pt. Informed pt of the results and recs per MR. Pt verbalized understanding and denied any further questions or concerns at this time. Pt aware someone will be contacting pt to schedule appt for GI. Order placed. Nothing further needed.

## 2014-03-28 ENCOUNTER — Telehealth: Payer: Self-pay | Admitting: Internal Medicine

## 2014-03-28 NOTE — Telephone Encounter (Signed)
Left message for pt to call back  °

## 2014-04-02 NOTE — Telephone Encounter (Signed)
Pt states she was referred by pulmonary ?aspiration. Pt scheduled to see Willette Cluster NP 04/10/14@1 :30pm. Pt aware of appt.

## 2014-04-10 ENCOUNTER — Ambulatory Visit: Payer: Commercial Managed Care - HMO | Admitting: Nurse Practitioner

## 2014-04-23 ENCOUNTER — Ambulatory Visit (INDEPENDENT_AMBULATORY_CARE_PROVIDER_SITE_OTHER): Payer: Commercial Managed Care - HMO | Admitting: Nurse Practitioner

## 2014-04-23 ENCOUNTER — Encounter: Payer: Self-pay | Admitting: Nurse Practitioner

## 2014-04-23 VITALS — BP 110/60 | HR 64 | Ht 67.0 in | Wt 225.2 lb

## 2014-04-23 DIAGNOSIS — R053 Chronic cough: Secondary | ICD-10-CM

## 2014-04-23 DIAGNOSIS — K219 Gastro-esophageal reflux disease without esophagitis: Secondary | ICD-10-CM

## 2014-04-23 DIAGNOSIS — R05 Cough: Secondary | ICD-10-CM

## 2014-04-23 NOTE — Progress Notes (Addendum)
History of Present Illness:   Patient is a 58 year old female referred by Pulmonary for evaluation of possible aspiration. We saw patient in late December for the same issue. Barium swallow following that visit was unremarkable. Chest CTscan mid September  Reveals slight progression of ground glass attenuation of both bases felt to be early interstitial lung disease possibly related to episodes of aspiration. Patient doesn't describe typical dysphagia but does feel like food "piles up" at top of her stomach. No nausea. She has lost 15 pounds since late December but doesn't know why.   Patient's mail order pharmacy didn't send refill for Protonix this time so she was off PPI for almost two months. She did recently start Prilosec. Patient has occasional heartburn and occasional regurgitation of gastric contents with food particles. She chokes when supine but attributes this to sinus drainage. She has slept in a recliner for years.  She couldn't afford Linzess for chronic constipation so taking Mg+, Miralax and suppositories prn.    Current Medications, Allergies, Past Medical History, Past Surgical History, Family History and Social History were reviewed in [facility] electronic medical record.  Studies:   Ct Chest High Resolution  03/26/2014   CLINICAL DATA:  Chronic cough.  EXAM: CT CHEST WITHOUT CONTRAST  TECHNIQUE: Multidetector CT imaging of the chest was performed following the standard protocol without intravenous contrast. High resolution imaging of the lungs, as well as inspiratory and expiratory imaging, was performed.  COMPARISON:  Chest CT 05/30/2013.  FINDINGS: Mediastinum: Heart size is normal. There is no significant pericardial fluid, thickening or pericardial calcification. No pathologically enlarged mediastinal or hilar lymph nodes. Please note that accurate exclusion of hilar adenopathy is limited on noncontrast CT scans. Esophagus is unremarkable in appearance.   Lungs/Pleura: The previously described patchy areas of ground-glass attenuation in the lower lobes of the lungs bilaterally are slightly more apparent on today's examination. These are predominantly peribronchovascular in distribution. High-resolution images demonstrate no areas of significant subpleural reticulation, parenchymal banding, traction bronchiectasis or frank honeycombing. No consolidative airspace disease. No suspicious appearing pulmonary nodules or masses. No pleural effusions.  Upper Abdomen: Unremarkable.  Musculoskeletal: There are no aggressive appearing lytic or blastic lesions noted in the visualized portions of the skeleton.  IMPRESSION: 1. Unusual pattern of peribronchovascular ground-glass attenuation in the lung bases bilaterally. This appears slightly progressive compared to the prior examination. This could represent an early manifestation of interstitial lung disease such as nonspecific interstitial pneumonia (NSIP), and may relate to recurrent episodes of aspiration. Clinical correlation is recommended.   Electronically Signed   By: Trudie Reedaniel  Entrikin M.D.   On: 03/26/2014 11:46   Physical Exam: General: Pleasant, well developed , black female in no acute distress Head: Normocephalic and atraumatic Eyes:  sclerae anicteric, conjunctiva pink  Ears: Normal auditory acuity Lungs: Clear throughout to auscultation but deep inspiration led to forceful coughing Heart: Regular rate and rhythm Abdomen: Soft, non distended, non-tender. No masses, no hepatomegaly. Normal bowel sounds Musculoskeletal: Symmetrical with no gross deformities  Extremities: No edema  Neurological: Alert oriented x 4, grossly nonfocal Psychological:  Alert and cooperative. Normal mood and affect  Assessment and Recommendations:   58 year old female with recurrent PNA, possibly aspiration. Patient has a chronic cough as well as vocal cord dysfunction. She does have occasional regurgitation of food particles,  suppose she could have more proximal reflux / laryngopharyngeal reflux . Though patient doesn't describe typical dysphagia (and barium swallow was negative) she does  describe sensation of food "piling up" in top of stomach and she has lost 15 pounds since December. We could evaluate further with upper endoscopy. If EGD negative then consider 24 hour pH / impedence study for further evaluation. Of note, patient coughing vigorously in exam room, feels tight in chest like she is getting bronchitis. Patient to contact PCP for evaluation. Following that I can see her back for a brief visit and arrange for EGD if stable from pulmonary standpoint.    Addendum: Reviewed and agree with initial management. If EEG negative, next definitive test would be esophageal manometry with 24-hour pH and impedance Beverley FiedlerJay M Pyrtle, MD

## 2014-04-23 NOTE — Patient Instructions (Signed)
Please follow up with your Primary Care Physician   Please continue your Omeprazole   You have a follow up visit scheduled with Willette ClusterPaula Guenther, NP for 05-07-2014 at 9 am. If you need to cancel or reschedule please call [phone]

## 2014-04-24 ENCOUNTER — Encounter: Payer: Self-pay | Admitting: Nurse Practitioner

## 2014-05-07 ENCOUNTER — Encounter: Payer: Self-pay | Admitting: Nurse Practitioner

## 2014-05-07 ENCOUNTER — Ambulatory Visit (INDEPENDENT_AMBULATORY_CARE_PROVIDER_SITE_OTHER): Payer: Commercial Managed Care - HMO | Admitting: Nurse Practitioner

## 2014-05-07 VITALS — BP 120/64 | HR 72 | Ht 67.0 in | Wt 226.1 lb

## 2014-05-07 DIAGNOSIS — R1314 Dysphagia, pharyngoesophageal phase: Secondary | ICD-10-CM

## 2014-05-07 DIAGNOSIS — J690 Pneumonitis due to inhalation of food and vomit: Secondary | ICD-10-CM

## 2014-05-07 DIAGNOSIS — K219 Gastro-esophageal reflux disease without esophagitis: Secondary | ICD-10-CM

## 2014-05-07 DIAGNOSIS — R634 Abnormal weight loss: Secondary | ICD-10-CM

## 2014-05-07 NOTE — Progress Notes (Addendum)
     History of Present Illness:  Patient is a 58 year old female who I saw approximately 2 weeks ago for evaluation of possible aspiration. We had seen her in late December for the same issue. Barium swallow following that visit was unremarkable. She had a chest CTscan in mid September revealing slight progression of ground glass attenuation of both bases felt to be early interstitial lung disease possibly related to episodes of aspiration. She is now re-referred for evaluation of aspiration.   Patient doesn't describe typical dysphagia but does feel like food "piles up" at top of her stomach. She has occasional heartburn and occasional regurgitation of gastric contents with food particles. She chokes when in supine position but attributes that to sinus drainage. She has slept in a recliner for years. When I saw patient in the office a couple weeks ago she had been off PPI for 2 months because of some mail order pharmacy issues. She has since been back on Protonix.    Patient has involuntarily lost 15 pounds since December 2014 but weight stable at 226 for last couple of months. .   Current Medications, Allergies, Past Medical History, Past Surgical History, Family History and Social History were reviewed in [employer] record.  Physical Exam: General: Pleasant, well developed ,  female in no acute distress Head: Normocephalic and atraumatic Eyes:  sclerae anicteric, conjunctiva pink  Ears: Normal auditory acuity Lungs: Clear throughout to auscultation Heart: Regular rate and rhythm Abdomen: Soft, non distended, non-tender. No masses, no hepatomegaly. Normal bowel sounds Musculoskeletal: Symmetrical with no gross deformities  Extremities: No edema  Neurological: Alert oriented x 4, grossly nonfocal Psychological:  Alert and cooperative. Normal mood and affect  Assessment and Recommendations:  58 year old female with chronic cough, vocal cord dysfunction and recurrent  PNA.  Pulmonary concerned she is aspirating. No significant GERD symptoms. Barium swallow negative. Patient doesn't describe typical dysphagia. She does however describe sensation of food "piling up" in top of stomach and has had a documented weight loss of 15 pounds since December. Given that, EGD is not unreasonable. We discussed EGD at last visit but her pulmonary status would not allow.  After antibiotics and steroids respiratory status has significantly improved and she is agreeable to proceed with EGD. The benefits, risks, and potential complications of EGD with possible biopsies were discussed with the patient and she agrees to proceed. As far as the aspiration concern, if EGD is negative may consider a 24 hour pH / impedence study for further evaluation.   Addendum: Reviewed and agree with initial management. Jerene Bears, MD

## 2014-05-07 NOTE — Patient Instructions (Addendum)
You have been scheduled for an endoscopy. Please follow written instructions given to you at your visit today. If you use inhalers (even only as needed), please bring them with you on the day of your procedure. Your physician has requested that you go to www.startemmi.com and enter the access code given to you at your visit today. This web site gives a general overview about your procedure. However, you should still follow specific instructions given to you by our office regarding your preparation for the procedure.  We have put your name on a cancellation list. Someone else may cancel and we may call you with a sooner appointment for the Endoscopy.

## 2014-05-08 DIAGNOSIS — R634 Abnormal weight loss: Secondary | ICD-10-CM | POA: Insufficient documentation

## 2014-05-23 ENCOUNTER — Encounter: Payer: Self-pay | Admitting: Internal Medicine

## 2014-06-18 ENCOUNTER — Encounter: Payer: Self-pay | Admitting: Internal Medicine

## 2014-06-18 ENCOUNTER — Ambulatory Visit (AMBULATORY_SURGERY_CENTER): Payer: Commercial Managed Care - HMO | Admitting: Internal Medicine

## 2014-06-18 VITALS — BP 135/78 | HR 60 | Temp 97.8°F | Resp 12 | Ht 67.0 in | Wt 226.0 lb

## 2014-06-18 DIAGNOSIS — K2970 Gastritis, unspecified, without bleeding: Secondary | ICD-10-CM

## 2014-06-18 DIAGNOSIS — K219 Gastro-esophageal reflux disease without esophagitis: Secondary | ICD-10-CM

## 2014-06-18 DIAGNOSIS — K2950 Unspecified chronic gastritis without bleeding: Secondary | ICD-10-CM

## 2014-06-18 DIAGNOSIS — R634 Abnormal weight loss: Secondary | ICD-10-CM

## 2014-06-18 DIAGNOSIS — R05 Cough: Secondary | ICD-10-CM

## 2014-06-18 DIAGNOSIS — R053 Chronic cough: Secondary | ICD-10-CM

## 2014-06-18 LAB — GLUCOSE, CAPILLARY
Glucose-Capillary: 70 mg/dL (ref 70–99)
Glucose-Capillary: 71 mg/dL (ref 70–99)
Glucose-Capillary: 127 mg/dL — ABNORMAL HIGH (ref 70–99)

## 2014-06-18 MED ORDER — DEXTROSE 5 % IV SOLN: INTRAVENOUS | Status: DC

## 2014-06-18 MED ORDER — SODIUM CHLORIDE 0.9 % IV SOLN: 500.0000 mL | INTRAVENOUS | Status: DC

## 2014-06-18 NOTE — Progress Notes (Signed)
Report to PACU, RN, vss, BBS= Clear.  

## 2014-06-18 NOTE — Progress Notes (Signed)
[name], CMA picked pt up in admitting and Milford CageBarbara Smith, CMA reported blood sugar was 71 at 13:23.  Pt still asymptomatic.  Kristin will report to CRNA in procedure room. maw

## 2014-06-18 NOTE — Progress Notes (Signed)
Called to room to assist during endoscopic procedure.  Patient ID and intended procedure confirmed with present staff. Received instructions for my participation in the procedure from the performing physician.  

## 2014-06-18 NOTE — Patient Instructions (Signed)
Discharge instructions given. Handouts on hiatal hernia and gastritis. Resume previous medications. YOU HAD AN ENDOSCOPIC PROCEDURE TODAY AT [facility]: Refer to the procedure report that was given to you for any specific questions about what was found during the examination.  If the procedure report does not answer your questions, please call your gastroenterologist to clarify.  If you requested that your care partner not be given the details of your procedure findings, then the procedure report has been included in a sealed envelope for you to review at your convenience later.  YOU SHOULD EXPECT: Some feelings of bloating in the abdomen. Passage of more gas than usual.  Walking can help get rid of the air that was put into your GI tract during the procedure and reduce the bloating. If you had a lower endoscopy (such as a colonoscopy or flexible sigmoidoscopy) you may notice spotting of blood in your stool or on the toilet paper. If you underwent a bowel prep for your procedure, then you may not have a normal bowel movement for a few days.  DIET: Your first meal following the procedure should be a light meal and then it is ok to progress to your normal diet.  A half-sandwich or bowl of soup is an example of a good first meal.  Heavy or fried foods are harder to digest and may make you feel nauseous or bloated.  Likewise meals heavy in dairy and vegetables can cause extra gas to form and this can also increase the bloating.  Drink plenty of fluids but you should avoid alcoholic beverages for 24 hours.  ACTIVITY: Your care partner should take you home directly after the procedure.  You should plan to take it easy, moving slowly for the rest of the day.  You can resume normal activity the day after the procedure however you should NOT DRIVE or use heavy machinery for 24 hours (because of the sedation medicines used during the test).    SYMPTOMS TO REPORT IMMEDIATELY: A gastroenterologist  can be reached at any hour.  During normal business hours, 8:30 AM to 5:00 PM Monday through Friday, call [phone].  After hours and on weekends, please call the GI answering service at [phone] who will take a message and have the physician on call contact you.   Following upper endoscopy (EGD)  Vomiting of blood or coffee ground material  New chest pain or pain under the shoulder blades  Painful or persistently difficult swallowing  New shortness of breath  Fever of 100F or higher  Black, tarry-looking stools  FOLLOW UP: If any biopsies were taken you will be contacted by phone or by letter within the next 1-3 weeks.  Call your gastroenterologist if you have not heard about the biopsies in 3 weeks.  Our staff will call the home number listed on your records the next business day following your procedure to check on you and address any questions or concerns that you may have at that time regarding the information given to you following your procedure. This is a courtesy call and so if there is no answer at the home number and we have not heard from you through the emergency physician on call, we will assume that you have returned to your regular daily activities without incident.  SIGNATURES/CONFIDENTIALITY: You and/or your care partner have signed paperwork which will be entered into your electronic medical record.  These signatures attest to the fact that that the information above on your  After Visit Summary has been reviewed and is understood.  Full responsibility of the confidentiality of this discharge information lies with you and/or your care-partner.

## 2014-06-18 NOTE — Progress Notes (Signed)
Pt's blood sugar was 70 [facility]  Pt denies and low blood sugar sx.  13:10 d5w was piggy banked to normal saline.  maw

## 2014-06-19 ENCOUNTER — Telehealth: Payer: Self-pay

## 2014-06-19 NOTE — Telephone Encounter (Signed)
  Follow up Call-  Call back number 06/18/2014  Post procedure Call Back phone  # [phone] cell  Permission to leave phone message Yes     Patient questions:  Do you have a fever, pain , or abdominal swelling? No. Pain Score  0 *  Have you tolerated food without any problems? Yes.    Have you been able to return to your normal activities? Yes.    Do you have any questions about your discharge instructions: Diet   No. Medications  No. Follow up visit  No.  Do you have questions or concerns about your Care? No.  Actions: * If pain score is 4 or above: No action needed, pain <4.

## 2014-06-20 NOTE — Op Note (Signed)
[address] [place], [id number]   ENDOSCOPY PROCEDURE REPORT  PATIENT: [name], [name]  MR#: [id number] BIRTHDATE: October 25, 1955 , 85  yrs. old GENDER: female ENDOSCOPIST: Jerene Bears, MD PROCEDURE DATE:  06/18/2014 PROCEDURE:  EGD w/ biopsy ASA CLASS:     Class III INDICATIONS:  chronic cough, GERD, recurrent pneumonia. MEDICATIONS: Monitored anesthesia care, Propofol 100 mg IV, and lidocaine 100 mg IV TOPICAL ANESTHETIC: none  DESCRIPTION OF PROCEDURE: After the risks benefits and alternatives of the procedure were thoroughly explained, informed consent was obtained.  The LB [id number] endoscope was introduced through the mouth and advanced to the second portion of the duodenum , Without limitations.  The instrument was slowly withdrawn as the mucosa was fully examined.  ESOPHAGUS: The mucosa of the esophagus appeared normal.  STOMACH: A small hiatal hernia was noted.   Gastritis (inflammation) was found in the gastric body.  Cold forcep biopsies were taken at the gastric body, antrum and angularis to evaluate for h.  pylori. The stomach otherwise appeared normal.  DUODENUM: The duodenal mucosa showed no abnormalities in the bulb and 2nd part of the duodenum.  Retroflexed views revealed a hiatal hernia.     The scope was then withdrawn from the patient and the procedure completed.  COMPLICATIONS: There were no immediate complications.  ENDOSCOPIC IMPRESSION: 1.   The mucosa of the esophagus appeared normal 2.   Small hiatal hernia 3.   Gastritis (inflammation) was found in the gastric body 4.   The stomach otherwise appeared normal 5.   The duodenal mucosa showed no abnormalities in the bulb and 2nd part of the duodenum  RECOMMENDATIONS: 1.  Await biopsy results 2.  Anti-reflux regimen, GERD diet 3.  If recurrent aspiration, then would recommend modified barium swallow.  Gastric emptying study recommend followed by 24  hr impedance if necessary. 4.  Office follow-up with me  eSigned:  Jerene Bears, MD 06/18/2014 2:02 PM  CC:The patient, PCP, Brand Males, MD

## 2014-06-22 ENCOUNTER — Encounter: Payer: Self-pay | Admitting: Internal Medicine

## 2014-07-12 ENCOUNTER — Ambulatory Visit (INDEPENDENT_AMBULATORY_CARE_PROVIDER_SITE_OTHER): Payer: Commercial Managed Care - HMO | Admitting: Podiatry

## 2014-07-12 ENCOUNTER — Ambulatory Visit (INDEPENDENT_AMBULATORY_CARE_PROVIDER_SITE_OTHER): Payer: Commercial Managed Care - HMO

## 2014-07-12 ENCOUNTER — Encounter: Payer: Self-pay | Admitting: Podiatry

## 2014-07-12 VITALS — BP 137/72 | HR 54 | Resp 14 | Ht 67.0 in | Wt 142.0 lb

## 2014-07-12 DIAGNOSIS — M79673 Pain in unspecified foot: Secondary | ICD-10-CM

## 2014-07-12 DIAGNOSIS — E1142 Type 2 diabetes mellitus with diabetic polyneuropathy: Secondary | ICD-10-CM

## 2014-07-12 DIAGNOSIS — B351 Tinea unguium: Secondary | ICD-10-CM

## 2014-07-12 NOTE — Progress Notes (Signed)
   Subjective:    Patient ID: [name], female    DOB: 12/16/1955, 58 y.o.   MRN: [id number]  HPI 58 year old female presents the office today with complaints of painful elongated toenails as well as burning to both of her feet. She states that she has had burning to her feet for quite a bit of time however over the last couple months she has noticed increased symptoms. She is on gabapentin she states that she had talked her primary care physician who states that she can increase the dose from 2 times a day to 3 times a day. Any recent injury or trauma. No other complaints at this time.   Review of Systems  All other systems reviewed and are negative.      Objective:   Physical Exam AAO 3, NAD DP/PT pulses palpable, CRT less than 3 seconds Protective sensation intact with Simms Weinstein monofilament, decreased vibratory sensation, Achilles tendon reflex intact Mild hammertoe contractures to bilateral lesser digits. There is mild hallux limitus bilaterally. Decrease in medial arch height upon weightbearing. Hyperkeratotic lesion submetatarsal 2/3 bilaterally. Upon debridement no underlying ulceration. There is no clinical signs of infection. Nails hypertrophic, dystrophic, elongated, brittle, discolored 10. No swelling erythema or drainage. There is no areas of pinpoint bony tenderness or pain with vibratory sensation to bilateral lower extremity. There is no overlying edema, erythema, increase in warmth. No pain with calf compression, swelling, erythema.     Assessment & Plan:  58 year old female with symptomatically onychomycosis, neuropathy -Treatment options were discussed with the patient including alternatives, risks, competitions. -Nail sharply debrided 10 without complications. -Keratotic lesions bilateral reduction was debridement 2 without complications. -Discussed the patient to follow-up with her primary care physician in regards to gabapentin. She states that she is  still taken twice a day and has not yet increases 3 times a day. She states that she has tolerated the medication well any side effects. Discussed that if symptoms continue, likely refer to neurology.  -Discussed the importance of daily foot inspection. -Patient would likely benefit from diabetic shoes/insert. Patient does not want a diabetic shoe however would like to proceed inserts. Patient was scanned for inserts and will be sent to her insurance for prior approval. -Follow-up in 3 months or sooner should any problems arise. In the meantime, encouraged to call the office with any questions, concerns, change in symptoms.

## 2014-07-12 NOTE — Patient Instructions (Signed)
Diabetes and Foot Care Diabetes may cause you to have problems because of poor blood supply (circulation) to your feet and legs. This may cause the skin on your feet to become thinner, break easier, and heal more slowly. Your skin may become dry, and the skin may peel and crack. You may also have nerve damage in your legs and feet causing decreased feeling in them. You may not notice minor injuries to your feet that could lead to infections or more serious problems. Taking care of your feet is one of the most important things you can do for yourself.  HOME CARE INSTRUCTIONS  Wear shoes at all times, even in the house. Do not go barefoot. Bare feet are easily injured.  Check your feet daily for blisters, cuts, and redness. If you cannot see the bottom of your feet, use a mirror or ask someone for help.  Wash your feet with warm water (do not use hot water) and mild soap. Then pat your feet and the areas between your toes until they are completely dry. Do not soak your feet as this can dry your skin.  Apply a moisturizing lotion or petroleum jelly (that does not contain alcohol and is unscented) to the skin on your feet and to dry, brittle toenails. Do not apply lotion between your toes.  Trim your toenails straight across. Do not dig under them or around the cuticle. File the edges of your nails with an emery board or nail file.  Do not cut corns or calluses or try to remove them with medicine.  Wear clean socks or stockings every day. Make sure they are not too tight. Do not wear knee-high stockings since they may decrease blood flow to your legs.  Wear shoes that fit properly and have enough cushioning. To break in new shoes, wear them for just a few hours a day. This prevents you from injuring your feet. Always look in your shoes before you put them on to be sure there are no objects inside.  Do not cross your legs. This may decrease the blood flow to your feet.  If you find a minor scrape,  cut, or break in the skin on your feet, keep it and the skin around it clean and dry. These areas may be cleansed with mild soap and water. Do not cleanse the area with peroxide, alcohol, or iodine.  When you remove an adhesive bandage, be sure not to damage the skin around it.  If you have a wound, look at it several times a day to make sure it is healing.  Do not use heating pads or hot water bottles. They may burn your skin. If you have lost feeling in your feet or legs, you may not know it is happening until it is too late.  Make sure your health care provider performs a complete foot exam at least annually or more often if you have foot problems. Report any cuts, sores, or bruises to your health care provider immediately. SEEK MEDICAL CARE IF:   You have an injury that is not healing.  You have cuts or breaks in the skin.  You have an ingrown nail.  You notice redness on your legs or feet.  You feel burning or tingling in your legs or feet.  You have pain or cramps in your legs and feet.  Your legs or feet are numb.  Your feet always feel cold. SEEK IMMEDIATE MEDICAL CARE IF:   There is increasing redness,   swelling, or pain in or around a wound.  There is a red line that goes up your leg.  Pus is coming from a wound.  You develop a fever or as directed by your health care provider.  You notice a bad smell coming from an ulcer or wound. Document Released: 06/26/2000 Document Revised: 03/01/2013 Document Reviewed: 12/06/2012 [employer] Patient Information 2015 [employer]. This information is not intended to replace advice given to you by your health care provider. Make sure you discuss any questions you have with your health care provider.  

## 2014-08-07 ENCOUNTER — Encounter: Payer: Self-pay | Admitting: *Deleted

## 2014-08-20 ENCOUNTER — Encounter: Payer: Self-pay | Admitting: Internal Medicine

## 2014-08-20 ENCOUNTER — Ambulatory Visit (INDEPENDENT_AMBULATORY_CARE_PROVIDER_SITE_OTHER): Payer: PPO | Admitting: Internal Medicine

## 2014-08-20 VITALS — BP 120/70 | HR 68 | Ht 67.0 in | Wt 225.6 lb

## 2014-08-20 DIAGNOSIS — R6881 Early satiety: Secondary | ICD-10-CM

## 2014-08-20 DIAGNOSIS — R198 Other specified symptoms and signs involving the digestive system and abdomen: Secondary | ICD-10-CM

## 2014-08-20 DIAGNOSIS — K219 Gastro-esophageal reflux disease without esophagitis: Secondary | ICD-10-CM

## 2014-08-20 DIAGNOSIS — K5900 Constipation, unspecified: Secondary | ICD-10-CM

## 2014-08-20 DIAGNOSIS — Z8709 Personal history of other diseases of the respiratory system: Secondary | ICD-10-CM

## 2014-08-20 NOTE — Progress Notes (Addendum)
Subjective:    Patient ID: [name], female    DOB: 06/03/1956, 59 y.o.   MRN: [id number]  HPI  [name] is a 59 year old female with a past medical history of COPD, sleep apnea, diabetes, hypertension, aspiration pneumonia, GERD with small hiatal hernia, chronic constipation who is seen in follow-up. She was last seen in the office in October 2015 by Tye Savoy, NP at which time there was concern of recurrent aspiration pneumonia and the possibility of aspiration. He was not having significant GERD symptoms at that time and previously barium swallow had been unrevealing. She was having early satiety and epigastric fullness with eating. That had been associated with a 15 pound weight loss over proximally 10 months. Upper endoscopy was recommended and performed on 06/18/2014. EGD revealed small hiatal hernia, mild gastritis but was otherwise unremarkable. Biopsies showed mild chronic inactive gastritis with no H. Pylori.  She returns today and recently has not had issue with another pneumonia. She is using her CPAP at night as well as using her inhalers as prescribed. One of her biggest issue continues to be constipation. She will have a bowel movement maybe 1 day per week and she reports incomplete evacuation. She denies blood in her stool or melena. Linzess was previously prescribed but not covered by insurance but she seems to remember it helping for the few days she used samples. Insurance has changed and she would like to try this medication again. She reports a good appetite and stable weight (weight today within 1 pound of measurement in October 2015). She does continue to have early fullness associated with upper abdominal bloating after eating. She denies heartburn or dysphagia. She has been able to lie down in her bed recently though continues to prop herself up on pillows. Previously she had been sleeping in a recliner. She admits to not checking her sugars on a regular basis but she is  using insulin, metformin, and Victoza.  Review of Systems As per history of present illness, otherwise negative  Current Medications, Allergies, Past Medical History, Past Surgical History, Family History and Social History were reviewed in [employer] record.     Objective:   Physical Exam BP 120/70 mmHg  Pulse 68  Ht 5' 7"$  (1.702 m)  Wt 225 lb 9.6 oz (102.331 kg)  BMI 35.33 kg/m2 Constitutional: Well-developed and well-nourished. No distress. HEENT: Normocephalic and atraumatic. Oropharynx is clear and moist. No oropharyngeal exudate. Conjunctivae are normal.  No scleral icterus. Neck: Neck supple. Trachea midline. Cardiovascular: Normal rate, regular rhythm and intact distal pulses. No M/R/G Pulmonary/chest: Distant breath sounds, Effort normal and breath sounds normal. No wheezing, rales or rhonchi. Abdominal: Soft, obese, nontender, nondistended. Bowel sounds active throughout. Extremities: no clubbing, cyanosis, or edema Neurological: Alert and oriented to person place and time. Skin: Skin is warm and dry. No rashes noted. Psychiatric: Normal mood and affect. Behavior is normal.  EGD reviewed including with the patient She reports previous colonoscopy performed in 2013 by Dr. Wynetta Emery which was reportedly normal. This was requested but never received. Will be re-requested today     Assessment & Plan:  59 year old female with a past medical history of COPD, sleep apnea, diabetes, hypertension, aspiration pneumonia, GERD with small hiatal hernia, chronic constipation who is seen in follow-up.   1. Early satiety, upper abdominal fullness -- highly suspicious for gastroparesis. She may benefit from a promotility medication such as metoclopramide or domperidone. I would like to first order a gastric emptying  study. I've also recommended a gastroparesis diet. She is not checking her blood sugars and I have asked that she check this on a daily basis, long her  results and notify her primary care provider if sugars remain consistently elevated greater than 150. She voices understanding. We discussed the association of diabetes, hyperglycemia and slow stomach emptying.  2. Constipation, chronic -- Linzess 290 g daily. Assess response at follow-up  3. Aspiration pneumonia/GERD -- no recent pneumonia in the last several months. Reflux precautions reiterated and advised. GERD seems to be controlled on daily PPI. Continue pantoprazole 40 mg daily. Hiatal hernia is small and not felt to be symptomatic.  Follow-up in 2-3 months.  Addendum Colonoscopy received dated 01/12/2012 -- for my Dr. Earle Gell. Normal digital rectal exam and normal entire colon. Recommended repeat 10 years

## 2014-08-20 NOTE — Patient Instructions (Addendum)
We have sent the following medications to your pharmacy for you to pick up at your convenience: Linzess 290 mcg daily  Please make certain to start checking your blood sugars more frequently. If elevated, please let your primary care Dr know about this.  You have been scheduled for a gastric emptying [facility] on 09/04/14 at 9:30 am. Please arrive at least 15 minutes prior to your appointment for registration. Please make certain not to have anything to eat or drink after midnight the night before your test. Hold all stomach medications (ex: Zofran, phenergan, Reglan) 48 hours prior to your test. If you need to reschedule your appointment, please contact radiology scheduling at [phone]. _____________________________________________________________________ A gastric-emptying study measures how long it takes for food to move through your stomach. There are several ways to measure stomach emptying. In the most common test, you eat food that contains a small amount of radioactive material. A scanner that detects the movement of the radioactive material is placed over your abdomen to monitor the rate at which food leaves your stomach. This test normally takes about 2 hours to complete. _____________________________________________________________________  CC: Dr Donette LarryHusain

## 2014-08-22 ENCOUNTER — Telehealth: Payer: Self-pay | Admitting: Internal Medicine

## 2014-08-22 MED ORDER — LINACLOTIDE 290 MCG PO CAPS: 290.0000 ug | ORAL_CAPSULE | Freq: Every day | ORAL | Status: DC

## 2014-08-22 NOTE — Telephone Encounter (Signed)
Rx sent 

## 2014-09-04 ENCOUNTER — Ambulatory Visit (HOSPITAL_COMMUNITY)
Admission: RE | Admit: 2014-09-04 | Discharge: 2014-09-04 | Disposition: A | Discharge status: Home / Self Care | Payer: PPO | Source: Ambulatory Visit | Attending: Internal Medicine | Admitting: Internal Medicine

## 2014-09-04 DIAGNOSIS — R140 Abdominal distension (gaseous): Secondary | ICD-10-CM | POA: Insufficient documentation

## 2014-09-04 DIAGNOSIS — R6881 Early satiety: Secondary | ICD-10-CM

## 2014-09-04 DIAGNOSIS — E119 Type 2 diabetes mellitus without complications: Secondary | ICD-10-CM | POA: Insufficient documentation

## 2014-09-04 DIAGNOSIS — R110 Nausea: Secondary | ICD-10-CM | POA: Insufficient documentation

## 2014-09-04 DIAGNOSIS — R198 Other specified symptoms and signs involving the digestive system and abdomen: Secondary | ICD-10-CM | POA: Insufficient documentation

## 2014-09-04 MED ORDER — TECHNETIUM TC 99M SULFUR COLLOID
2.2000 | Freq: Once | INTRAVENOUS | Status: AC | PRN
  Administered 2014-09-04: 2.1 via ORAL

## 2014-09-07 ENCOUNTER — Ambulatory Visit (INDEPENDENT_AMBULATORY_CARE_PROVIDER_SITE_OTHER): Payer: PPO | Admitting: Podiatry

## 2014-09-07 DIAGNOSIS — E1142 Type 2 diabetes mellitus with diabetic polyneuropathy: Secondary | ICD-10-CM

## 2014-09-07 DIAGNOSIS — M79673 Pain in unspecified foot: Secondary | ICD-10-CM | POA: Diagnosis not present

## 2014-09-07 NOTE — Progress Notes (Signed)
Patient ID: [name], female   DOB: 04/04/1956, 59 y.o.   MRN: [id number]  Patient presents today to the office to pick up 3 pairs of custom accommodative diabetic inserts. She has no new complaints at this time. The inserts were fitted and appear to be fitting well without any high-pressure areas or any complications. Appropriate break in period was discussed with the patient. Monitor for any skin changes. If there is any irritation to call the office immediately. Follow-up as scheduled.

## 2014-09-07 NOTE — Patient Instructions (Signed)

## 2014-09-13 ENCOUNTER — Ambulatory Visit (INDEPENDENT_AMBULATORY_CARE_PROVIDER_SITE_OTHER): Payer: PPO | Admitting: Physician Assistant

## 2014-09-13 ENCOUNTER — Encounter: Payer: Self-pay | Admitting: Physician Assistant

## 2014-09-13 VITALS — BP 120/58 | HR 74 | Ht 67.0 in | Wt 225.4 lb

## 2014-09-13 DIAGNOSIS — K3184 Gastroparesis: Secondary | ICD-10-CM

## 2014-09-13 DIAGNOSIS — K5900 Constipation, unspecified: Secondary | ICD-10-CM

## 2014-09-13 MED ORDER — METOCLOPRAMIDE HCL 5 MG/5ML PO SOLN: 5.0000 mg | Freq: Three times a day (TID) | ORAL | Status: DC

## 2014-09-13 NOTE — Patient Instructions (Signed)
We have sent the following medications to your pharmacy for you to pick up at your convenience: Reglan  Please review the hand out regarding Gastroparesis diet  Please follow up with Dr. Rhea BeltonPyrtle in 2 months

## 2014-09-13 NOTE — Progress Notes (Addendum)
Patient ID: [name], female   DOB: August 05, 1955, 59 y.o.   MRN: [id number]     History of Present Illness: [name] is a delightful 59 year old female known to Dr. Hilarie Fredrickson with past medical history of COPD, sleep apnea, diabetes, hypertension, aspiration pneumonia, GERD with a small hiatal hernia, chronic constipation, and gastroparesis. She has been seen in the past with early satiety and epigastric fullness with eating. She had an upper endoscopy in December 2015 that revealed a small hiatal hernia, mild gastritis but was otherwise unremarkable biopsies showed chronic mild inactive gastritis with no H. pylori. She also has trouble with constipation she was having a bowel movement one day per week with an ongoing symptom sensation of incomplete evacuation she was last seen in the office on February 8. She was given a trial of Linzess 290 g daily with this her stools are softer and she is having a bowel movement 2-3 times per week. She has less gas and bloating. She was also sent for a gastric emptying scan that confirmed gastroparesis and is here to review the results she continues to have early satiety with some postprandial nausea with no vomiting. She feels very full in the epigastric area for several hours after each meal.   Past Medical History  Diagnosis Date  . HTN (hypertension)   . Obesity   . Asthma   . Sciatica   . Rotator cuff tear   . Anxiety   . COPD (chronic obstructive pulmonary disease)   . Sleep apnea     CPAP, sleep study on [place]  . Headache(784.0)   . Environmental allergies     "year round"  . High cholesterol   . Cold extremity without peripheral vascular disease     bilaterally; "from my knees down into my feet; they get ice cold"  . Heart murmur   . Chronic bronchitis   . Vocal cord dysfunction     "I've had it for many years"  . Exertional dyspnea   . Type II diabetes mellitus   . GERD (gastroesophageal reflux disease)   . Arthritis     "both  shoulders"  . Sciatic nerve pain     "tailbone down into my legs when it flares up"  . Blood transfusion without reported diagnosis   . Hiatal hernia     Past Surgical History  Procedure Laterality Date  . Nasal sinus surgery  1992  . Toe surgery      for ingrown [id number] ?left  . Shoulder arthroscopy w/ rotator cuff repair  01/28/12    left  . Appendectomy  1976  . Oophorectomy  1976    w/ovarian cyst excision and appy  . Total abdominal hysterectomy  1985  . Abdominal adhesion surgery  ~ 1978   Family History  Problem Relation Age of Onset  . Heart disease Mother   . Asthma Mother   . Heart failure Maternal Grandmother   . Asthma Son   . Asthma Daughter   . Lupus Daughter   . Colon cancer Neg Hx   . Throat cancer Neg Hx   . Stomach cancer Neg Hx   . Esophageal cancer Neg Hx   . Rectal cancer Neg Hx   . Diabetes      "everybody on both sides"   History  Substance Use Topics  . Smoking status: Former Smoker -- 1.00 packs/day for 25 years    Types: Cigarettes    Quit date: 07/13/1996  . Smokeless  tobacco: Never Used  . Alcohol Use: No     Comment: 01/29/12 "used to abuse alcohol; last drink was in the 1980's"   Current Outpatient Prescriptions  Medication Sig Dispense Refill  . albuterol (PROVENTIL HFA;VENTOLIN HFA) 108 (90 BASE) MCG/ACT inhaler Inhale 2 puffs into the lungs every 4 (four) hours as needed for wheezing or shortness of breath.     . ALPRAZolam (XANAX) 0.5 MG tablet Take 0.5 mg by mouth 2 (two) times daily as needed.     Marland Kitchen aspirin 81 MG tablet Take 81 mg by mouth 2 (two) times daily.     Marland Kitchen atorvastatin (LIPITOR) 20 MG tablet Take 20 mg by mouth every evening.     . chlorpheniramine (CHLOR-TRIMETON) 4 MG tablet 2 tabs by mouth at bedtime    . citalopram (CELEXA) 10 MG tablet Take 1 tablet by mouth daily.    . cyclobenzaprine (FLEXERIL) 10 MG tablet Take 1 tablet by mouth as needed.    . gabapentin (NEURONTIN) 300 MG capsule Take 1 capsule by mouth  2 (two) times daily.    Marland Kitchen HUMULIN N KWIKPEN 100 UNIT/ML Kiwkpen Inject 30 Units into the skin 2 (two) times daily.    . Linaclotide (LINZESS) 290 MCG CAPS capsule Take 1 capsule (290 mcg total) by mouth daily. 30 capsule 2  . loratadine (CLARITIN) 10 MG tablet Take 10 mg by mouth every morning.    Marland Kitchen losartan (COZAAR) 100 MG tablet Take 1 tablet by mouth daily.    . metFORMIN (GLUCOPHAGE) 1000 MG tablet Take 1 tablet by mouth 2 (two) times daily.    . mometasone (NASONEX) 50 MCG/ACT nasal spray Place 2 sprays into the nose every morning.     . naproxen (NAPROSYN) 500 MG tablet Take 500 mg by mouth 2 (two) times daily with a meal.    . olmesartan (BENICAR) 40 MG tablet Take 1 tablet (40 mg total) by mouth daily.    . pantoprazole (PROTONIX) 40 MG tablet   9  . traMADol (ULTRAM) 50 MG tablet Take 50 mg by mouth every 6 (six) hours as needed for pain.     Marland Kitchen VICTOZA 18 MG/3ML SOPN Inject 18 mg into the skin every morning.     . metoCLOPramide (REGLAN) 5 MG/5ML solution Take 5 mLs (5 mg total) by mouth 4 (four) times daily -  before meals and at bedtime. 600 mL 1  . temazepam (RESTORIL) 30 MG capsule Take 1 capsule (30 mg total) by mouth at bedtime as needed for sleep. 30 capsule 1   No current facility-administered medications for this visit.   Allergies  Allergen Reactions  . Lisinopril Cough      Review of Systems: Gen: Denies any fever, chills, sweats, anorexia, fatigue, weakness, malaise, weight loss, and sleep disorder CV: Denies chest pain, angina, palpitations, syncope, orthopnea, PND, peripheral edema, and claudication. Resp: Denies dyspnea at rest, dyspnea with exercise, cough, sputum, wheezing, coughing up blood, and pleurisy. GI: Denies vomiting blood, jaundice, and fecal incontinence.   Denies dysphagia or odynophagia. GU : Denies urinary burning, blood in urine, urinary frequency, urinary hesitancy, nocturnal urination, and urinary incontinence. MS: Denies joint pain, limitation  of movement, and swelling, stiffness, low back pain, extremity pain. Denies muscle weakness, cramps, atrophy.  Derm: Denies rash, itching, dry skin, hives, moles, warts, or unhealing ulcers.  Psych: Denies depression, anxiety, memory loss, suicidal ideation, hallucinations, paranoia, and confusion. Heme: Denies bruising, bleeding, and enlarged lymph nodes. Neuro:  Denies any headaches, dizziness,  paresthesia Endo:  Denies any problems with DM, thyroid, adrenal  C-Diff   Studies:   Nm Gastric Emptying  09/04/2014   CLINICAL DATA:  Nausea, abdominal fullness, and early satiety. Acid reflux and bloating. History of diabetes.  EXAM: NUCLEAR MEDICINE GASTRIC EMPTYING SCAN  TECHNIQUE: After oral ingestion of radiolabeled meal, sequential abdominal images were obtained for 4 hours. Percentage of activity emptying the stomach calculated at 1 hour, 2 hour, 3 hour, and 4 hours.  RADIOPHARMACEUTICALS:  2.1 Technetium 99-m labeled sulfur colloid  COMPARISON:  Barium esophagram 07/12/2013  FINDINGS: Expected location of the stomach in the left upper quadrant. Ingested meal empties the stomach gradually although slowly and incompletely over the course of the study.  Minimal emptying at 1 hr (calculated as 0% emptying, although trace activity is seen in proximal small bowel loops) ( normal >= 10%)  19% emptied at 2 hr ( normal >= 40%)  53% emptied at 3 hr ( normal >= 70%)  69% emptied at 4 hr ( normal >= 90%)  IMPRESSION: Delayed gastric emptying.   Electronically Signed   By: Logan Bores   On: 09/04/2014 14:57     Physical Exam: General: Pleasant, well developed female in no acute distress Head: Normocephalic and atraumatic Eyes:  sclerae anicteric, conjunctiva pink  Ears: Normal auditory acuity Lungs: Clear throughout to auscultation Heart: Regular rate and rhythm Abdomen: Soft, non distended, non-tender. No masses, no hepatomegaly. Normal bowel sound Musculoskeletal: Symmetrical with no gross  deformities  Extremities: No edema  Neurological: Alert oriented x 4, grossly nonfocal Psychological:  Alert and cooperative. Normal mood and affect  Assessment and Recommendations:  #1. Gastroparesis. She will be given a trial of metoclopramide 5 mg per 5 ML 1 teaspoon before meals and at bedtime. We have spent a considerable amount of time reviewing the possible side effect profile of metoclopramide, and the patient was instructed to discontinue the medication immediately and contact us immediately if she has any trouble tolerating the medication. Discussed with patient that optimal glycemic control is necessary. She will continue to work with her primary care provider to get her diabetes under control. The patient has also been advised to adhere to a gastroparesis diet.  #2 chronic constipation. She notes some improvement on Linzess 290 g daily and will continue this regimen.  She will follow up in 2 months, sooner if needed.   Aayush Gelpi, Vita Barley PA-C 09/13/2014,  Addendum: Reviewed and agree with initial management. Jerene Bears, MD

## 2014-10-17 ENCOUNTER — Ambulatory Visit
Admission: RE | Admit: 2014-10-17 | Discharge: 2014-10-17 | Disposition: A | Discharge status: Home / Self Care | Payer: PPO | Source: Ambulatory Visit | Attending: Internal Medicine | Admitting: Internal Medicine

## 2014-10-17 ENCOUNTER — Other Ambulatory Visit: Payer: Self-pay | Admitting: Internal Medicine

## 2014-10-17 DIAGNOSIS — M549 Dorsalgia, unspecified: Secondary | ICD-10-CM

## 2014-10-22 ENCOUNTER — Ambulatory Visit (INDEPENDENT_AMBULATORY_CARE_PROVIDER_SITE_OTHER): Payer: PPO | Admitting: Internal Medicine

## 2014-10-22 ENCOUNTER — Encounter: Payer: Self-pay | Admitting: Internal Medicine

## 2014-10-22 VITALS — BP 122/68 | HR 61 | Ht 67.0 in | Wt 222.0 lb

## 2014-10-22 DIAGNOSIS — J849 Interstitial pulmonary disease, unspecified: Secondary | ICD-10-CM | POA: Diagnosis not present

## 2014-10-22 DIAGNOSIS — R053 Chronic cough: Secondary | ICD-10-CM

## 2014-10-22 DIAGNOSIS — R05 Cough: Secondary | ICD-10-CM | POA: Diagnosis not present

## 2014-10-22 NOTE — Progress Notes (Signed)
Subjective:    Patient ID: [name], female    DOB: [birth year], 59 y.o.   MRN: [id number]  HPI OV 02/17/2011. 59 year old female. Obese. AA female. Remote smoker On cpap for OSA (pmd managing). REferred by Dr Delfina Redwood.    New visit for chronic cough. Preesent for many years. Insidious onset.  Progressively with more frequency past few years. Cough rated as moderate. Usually dry cough with associated barking quality. Occ mucus present; thick and clear. Cough worsened by milk products, change in weather and dust. Hx of prior ENT eval in [place] > 15 years ago: diagnosed as "vocal cords sticking together" and remote hx of sinus surgery Also diagnosed with GERD in [place] > 15 years ago; takes protonix.  [facility] Reflux Symptom INdex score is 39 and very c/w LPR cough(severe hoarsness of voice, clearing of throat, ecess throat mucus, post nasal drip, coughing after lying down or eating, choking epidoses, sensation of globus with food sticking in thorat) and associated heart burn. Cough so severe that she could not complete spirometry in office today  Has dyspnea too: few years, insidious onset. Progressive. Rates it as moderate. Dyspnea brought on by associated chest tightness and nasal drainage or even exertion like climbing flight of steps. Dyspnea relieved by rest. Cough and dyspnea associated with wheezing and constant yellow sinus drainage as well. Frequent nocturnal awakenings with choking present. Above resulted in asthma diagnosis versus tracheobronchiolotis > 15 years ago: on advair since then which she is unsure is helping. She herself is not convinced she has asthma.   Associated med intake shows tramadol (for shoulder pain and sicatica), fish oil/ flaxseed (2 years) and ACE inhibitor intake (been on lisniopril for > 10 years due to diabetes)   Pas med hx review fromn outside record notes "hx of asthma and vocal cord dysfunction and post nasal drip. Recalls frequent ER visits atleast one per  month (last er visit 2 months ago per hx) to [facility]. Denies ICU admits, intubations for same.   REC Cough is from ACE Inhibitor, sinus drainage, vocal cord dysfunction, acid reflux and possible cough all conspiring to cause cyclical cough/LPR cough #Sinus drainage  - continue nasal steroid - refer to [facility] Dr. Wilburn Cornelia or Dr Redmond Baseman #Possible Acid Reflux  - continue protonix   - take diet sheet from [place] - avoid colas, spices, cheeses, spirits, red meats, beer, chocolates, fried foods etc.,   - sleep with head end of bed elevated  - eat small frequent meals  - do not go to bed for 3 hours after last meal - stop fish oil and flaxseed for now #Asthma   - continue advair for now - might have to consider changing over to something else later depending on ENT evaluation #Vocal Cord Dysfunction - see ENT doctor first  -later  will consider referral to speech therapy with [name] #BP  - stop lisinopril - take benicar 1 tablet daily - take sample, nurse will add it in med list #Followup - I will see you in 4-6 weeks.  - Reattempt spirometry with [name] at time of followup -Important you comply with advice 100% correctly   OV 03/31/11: Followup cough. SAw ENT 89/17./12: no anatomic path or VCD noticed at time of exam though patient noted to have choked.  She states that she was not happy with her experience at ENT visit and does not want to visit the same office again. She is compliant with instructions  except for diet though she has made improvements there. Off lisniopril. Follows sinus, gerd and astham advice. Does not like advair diskus. Overall cough is much better. She is happy with improvement. RSI cough score dropped from 39 to 14.  She sses her hoarsenss, clearing and pos nasal drip as mild-moderate  PFTS show no more flow volume loop issues. There is restriction with mixed obstruction. DLCO 67%   REC Cough is from sinus drainage, vocal cord dysfunction, acid  reflux and possible cough all conspiring to cause cyclical cough/LPR cough  #Sinus drainage  - continue nasal steroid - nurse will do script for generic fluticasone -  #Acid Reflux  - continue protonix   - take diet sheet from [place] [facility] - avoid colas, spices, cheeses, spirits, red meats, beer, chocolates, fried foods etc.,   - sleep with head end of bed elevated  - eat small frequent meals  - do not go to bed for 3 hours after last meal - cotninue to avoid fish oil and flaxseed   #Asthma   - continue advair but nurse will give you sample of HFA and teach you how to take it with spacer; she will do script as well  #Vocal Cord Dysfunction - referral to speech therapy with [name]  #BP  - will add lisinopril to allergy list - take benicar 1 tablet daily or the equivalent Dr Delfina Redwood gives you  #Followup - I will see you in 8 weeks.  - -Important you comply with advice 100% correctly - Glad you are much better - Flu shot today please   OV 06/26/2011 Followup for chronic cough. She continues to do better. Subjectively feels much better compared to last visit. Although objectively her cough score has only reduced by 1 point. Today, RSI cough score is 13. Level III postnasal drip. Level to hoarseness of voice, cough after lying down, choking episodes, and annoying cough. Level I sensation of lump in throat and heartburn.  In talking to her she is compliant with her nasal steroid and her Protonix for acid reflux but not compliant with diet for acid reflux. She is on Advair HFA with spacer for presumed asthma and she feels this makes her cough less compared to Advair discus. We discussed more about her asthma history and she says that she was never sure she had asthma diagnosis in [place] it was a presumptive diagnosis. She is eager to come off Advair and give a trial without it. She also gives a history of allergies and allergy shots while living in [place] and she is open to an  allergy evaluation right now. In terms of cyclical cough she has finished speech therapy evaluation and graduated from the program. Overall she feels quality-of-life is acceptable although she would like to see cough improve further. So far we have not tried Neurontin    Past, Family, Social reviewed: no change since last visit except that Dr Deforest Hoyles is new PMD. She now states she has year round allergies. Recollects while in [place] used to take allergy shots twice a week 10 years ago. Since moving to [place] 4 years ago not seen an allergist. Laverle Hobby to see allergist. She feels she does not have asthma.  Cough is from sinus drainage, vocal cord dysfunction, acid reflux and possible asthma or allergiesh all working together to cause cyclical cough/LPR cough  #Sinus drainage  - continue nasal steroid - nurse will do script for generic fluticasone  -  #Acid Reflux  - continue  protonix  - contniue (REALLY IMPORANT IS DIET) diet sheet from [place] [facility] - avoid colas, spices, cheeses, spirits, red meats, beer, chocolates, fried foods etc.,  - sleep with head end of bed elevated  - eat small frequent meals  - do not go to bed for 3 hours after last meal  - cotninue to avoid fish oil and flaxseed  #Asthma  - since diagnosis of asthma was in doubt. Please stop Advair and just use albuterol as needed  -We'll do spirometry at followup  # allergies  - Please see Dr. Baird Lyons in our office for allergy evaluation; will do referral  #Vocal Cord Dysfunction  - congratulations on graduating from program with speech therapy [name]  #BP  - Continue losartan  #Followup  - I will see you in 8 weeks.  - -Important you comply with advice 100% correctly  - Glad you are much better  - Cough score worksheet at followup   OV 08/25/2011 Followup for chronic cough. She is off advair as advised. She is doing gerd control.  Ojectively her cough score is not reduced. Last visit score was 13 but now it is 14.  (Level III postnasal drip, and post nasal drip. . Level 2  hoarseness of voice and choking episodes. Level 1 - cough after lying down,and  sensation of lump in throat and heartburn). Howevefr, subjectively cough much improved. Still with post nasal drip. Has seen Dr Annamaria Boots for allergy 08/06/11 and 08/19/11. RAST serum profile negative except for elevated [place] IGE. Allergy skin test pending 09/16/11. On visit 08/19/11 to Dr Annamaria Boots had atypical chest pain - releived with ppi in office. EKG okay. Trop was normal. . Of note, Dr Annamaria Boots is also addressing her prior OSA issues.   ROS c/o anxiety, insomnia, also chest pains  - atypical  Spirometry  - fev1 1.74L/72%. Ratio 78    #Chest pains  - please see [facility] asap - > equivocal stress test and but clinically low probability February 2013; placed in clinical followup #Cough  - for sinus: follow Dr Annamaria Boots recommendation  - for acid reflux: follow medications and diet  - for possible asthma: stay off advair and once cardiology ok, please have methacholine challenge test  #Followup  - complete cardiology visit and methacholine challenge  - 4-6 weeks from now  09/16/11 with Dr Annamaria Boots Allergist- 57 yoF former smoker referred by Dr Chase Caller who has been seeing her for multifactorial cough, questioning an allergic component At last visit she had presented with chest pain, preventing planned allergy skin testing. Now following with cardiology and pending CT scan of her heart/Dr. Burt Knack. Still complains of nasal congestion. Able to use her CPAP more regularly when her nose is not stopped up. Unable to do methacholine challenge last month because her baseline is from a tree scores were too low. CPAP autotitration done/[facility]. Pending download. PFT- 09/11/11- FVC 2.15/68%, FEV1 1.59/ 64%, FEV1/FVC 0.80. FEF 25-75 % 0.37/ 15%   OV 03/21/2013 Followup chronic cough  - I have not seen her in nearly 15 months. She says that basically her cough resolved but  cough is no longer chronic and only occurs intermittently when she has "bronchitis". In the last year she's had antibiotics and prednisone for acute episodes of coughing at least 3 times. In between episodes she is asymptomatic. Currently she feels she's having one such episode in the past 3 weeks with chest congestion, cough with white mucus, fatigue and associated wheeze. Currently RSI  cough score is 21 and show severe cough.  - Of note, she has not had methacholine challenge test that I advised a year and half ago. She says that she does not have vocal cord dysfunction based on Dr. Mickie Hillier exam in August 2012 ENT physician   REC levaquin and pred burst  04/20/2013 Follow up and Med review  new med calendar - pt brought all meds with her today.  does report some sinus pressure, congestion, SOB, wheezing, chest tightness. Is feeling better but still gets into coughing fits that takes her a while to calm down. Strong odors seem to causes attacks along with laughing  She did have an attack in the office. Sats were normal however she has significant upper airway psuedowheezing w/ barking cough.  No fever , chest pain, over reflux , orthopnea or edema.  No using CPAP lately   REC top Advair . Add Chlor trimeton '4mg'$  2 At bedtime   Change Loratadine '10mg'$  in am.  Add Pepcid '20mg'$  At bedtime   Avoid throat clearing , use sips of water.  Saline nasal rinses Twice daily  .  NO MINT PRODUCTS  GERD diet .  We are setting you up for a CT neck .  Restart CPAP At bedtime   follow up Dr. Chase Caller in 4 weeks.  Please contact office for sooner follow up if symptoms do not improve or worsen or seek emergency care    OV  05/26/2013 Chief Complaint  Patient presents with  . Follow-up    Pt c/o chest tightness, dry cough, wheezing and sob. Pt states this was some better while she was on prednisone. Last dose x 3 weeks ago.   Followup for chronic cough. She says her last 2-3 weeks that she's having  an acute bronchitis flare with yellow phlegm. She feels she needs antibiotics and prednisone. In fact in the time that she last saw me in September 2014 she's had another round of antibiotics and prednisone with her primary care physician Dr. Deforest Hoyles. She feels her chest is congested although in the office she is clearly having a barking cough, laryngeal spasm and upper airway noise intermittently with changes in voice. It is fairly significant. I reminded her that this is consistent with vocal cord dysfunction but she said that her ear nose throat surgeon cleared her for the same. Of note, she had a CT scan of the neck and this was normal.  07/26/2013 Follow up  2 month follow up - reports some  congestion, head congestion w/ light yellow mucus that is on/off. Has sinus drainage daily . Is not using claritin or chlortrimeton as recommended.  Was taken off ACE in past , now on Cozaar.   RSI cough score unchanged at 21  Had Esophagram on 1/8 w/ mild GERD/HH. No sign delay emptying .  No chest pain, orthopnea, edema , fever.  +hoarseness on/off.   She says she feels so much better with CPAP , using during daytime for naps and At bedtime  . Is amazed at how much she feels better.    OV 03/20/2014  Chief Complaint  Patient presents with  . Follow-up    Pt states she is unable to hold her voice as long when she sings. Pt c/o PND and prod cough, pt thinks its d/t allergies. Pt c/o chest soreness on right upper chest.    Followup chronic cough  - I have not seen the patient in nearly 10 months. Overall cough was  stable but recently she has had a flareup. I'm not so sure that she is followed instructions compliantly. It took me a while to understand the medication regimen but it appears that she takes dymista for  Sinuses but saline wash as needed, and not taking claritin, Advair as needed for her lungs, Protonix schedule followup acid reflux but Pepcid only as needed. She is only somewhat compliant with  the Neurontin. She continues to have sinus drainage, clearing of the throat and the laryngeal/barking cough that she feels is coming from the throat. This is associated with dyspnea and is of moderate severity, and some wheezing but she feels wheeze is upper airway  REC #Chronic cough  - I thnk cough is related to voice box issues and loss of neural control of the same - Please take prednisone 40 mg x1 day, then 30 mg x1 day, then 20 mg x1 day, then 10 mg x1 day, and then 5 mg x1 day and stop - refer Dr Ernestine Conrad [facility] for chronic cough - for now continue current nasal, acid reflux and neurontin therapies - continue albuterol as needed ; for now hold off scheduled advair - repeat HRCT chest wo contrast for chronic cough  #FOllowup  3 months - after you see Dr Joya Gaskins of [facility]  Come sooner if there are problems o  OV 10/22/2014  Chief Complaint  Patient presents with  . Follow-up    Pt stated her cough has improved. Pt c/o hoarseness and chest tightness. Pt is wearing CPAP nightly and denies any issues with machine, mask or pressure.     FU chronic cough  - IN interim in Sept 2015 had CT chest : CT chest 03/26/14 shows worsening infiltreats in bases compaed to nov 2014. COncern for aspiration and referred  To GI. She does not know details of GI Visit but review of records shows she has gastroparesis and hs been started on reglan. Also she saw Dr Joya Gaskins of ENT in [place]. WIth above measure cough has resolved. /nearly resolved. No respiratory issues.    has a past medical history of HTN (hypertension); Obesity; Asthma; Sciatica; Rotator cuff tear; Anxiety; COPD (chronic obstructive pulmonary disease); Sleep apnea; Headache(784.0); Environmental allergies; High cholesterol; Cold extremity without peripheral vascular disease; Heart murmur; Chronic bronchitis; Vocal cord dysfunction; Exertional dyspnea; Type II diabetes mellitus; GERD (gastroesophageal reflux disease);  Arthritis; Sciatic nerve pain; Blood transfusion without reported diagnosis; and Hiatal hernia.   reports that she quit smoking about 18 years ago. Her smoking use included Cigarettes. She has a 25 pack-year smoking history. She has never used smokeless tobacco.  Past Surgical History  Procedure Laterality Date  . Nasal sinus surgery  1992  . Toe surgery      for ingrown [id number] ?left  . Shoulder arthroscopy w/ rotator cuff repair  01/28/12    left  . Appendectomy  1976  . Oophorectomy  1976    w/ovarian cyst excision and appy  . Total abdominal hysterectomy  1985  . Abdominal adhesion surgery  ~ 1978    Allergies  Allergen Reactions  . Lisinopril Cough    Immunization History  Administered Date(s) Administered  . Influenza Split 03/31/2011, 05/19/2013, 03/13/2014  . Pneumococcal Polysaccharide-23 07/14/2001  . Td 07/15/1998    Family History  Problem Relation Age of Onset  . Heart disease Mother   . Asthma Mother   . Heart failure Maternal Grandmother   . Asthma Son   . Asthma Daughter   .  Lupus Daughter   . Colon cancer Neg Hx   . Throat cancer Neg Hx   . Stomach cancer Neg Hx   . Esophageal cancer Neg Hx   . Rectal cancer Neg Hx   . Diabetes      "everybody on both sides"     Current outpatient prescriptions:  .  albuterol (PROVENTIL HFA;VENTOLIN HFA) 108 (90 BASE) MCG/ACT inhaler, Inhale 2 puffs into the lungs every 4 (four) hours as needed for wheezing or shortness of breath. , Disp: , Rfl:  .  ALPRAZolam (XANAX) 0.5 MG tablet, Take 0.5 mg by mouth 2 (two) times daily as needed. , Disp: , Rfl:  .  aspirin 81 MG tablet, Take 81 mg by mouth 2 (two) times daily. , Disp: , Rfl:  .  atorvastatin (LIPITOR) 20 MG tablet, Take 20 mg by mouth every evening. , Disp: , Rfl:  .  chlorpheniramine (CHLOR-TRIMETON) 4 MG tablet, 2 tabs by mouth at bedtime, Disp: , Rfl:  .  citalopram (CELEXA) 10 MG tablet, Take 1 tablet by mouth daily., Disp: , Rfl:  .   cyclobenzaprine (FLEXERIL) 10 MG tablet, Take 1 tablet by mouth as needed., Disp: , Rfl:  .  gabapentin (NEURONTIN) 300 MG capsule, Take 1 capsule by mouth 2 (two) times daily., Disp: , Rfl:  .  HUMULIN N KWIKPEN 100 UNIT/ML Kiwkpen, Inject 30 Units into the skin 2 (two) times daily., Disp: , Rfl:  .  Linaclotide (LINZESS) 290 MCG CAPS capsule, Take 1 capsule (290 mcg total) by mouth daily., Disp: 30 capsule, Rfl: 2 .  loratadine (CLARITIN) 10 MG tablet, Take 10 mg by mouth every morning., Disp: , Rfl:  .  losartan (COZAAR) 100 MG tablet, Take 1 tablet by mouth daily., Disp: , Rfl:  .  metFORMIN (GLUCOPHAGE) 1000 MG tablet, Take 1 tablet by mouth 2 (two) times daily., Disp: , Rfl:  .  metoCLOPramide (REGLAN) 5 MG/5ML solution, Take 5 mLs (5 mg total) by mouth 4 (four) times daily -  before meals and at bedtime., Disp: 600 mL, Rfl: 1 .  mometasone (NASONEX) 50 MCG/ACT nasal spray, Place 2 sprays into the nose every morning. , Disp: , Rfl:  .  pantoprazole (PROTONIX) 40 MG tablet, , Disp: , Rfl: 9 .  traMADol (ULTRAM) 50 MG tablet, Take 50 mg by mouth every 6 (six) hours as needed for pain. , Disp: , Rfl:  .  VICTOZA 18 MG/3ML SOPN, Inject 18 mg into the skin every morning. , Disp: , Rfl:     Review of Systems  Constitutional: Negative for fever and unexpected weight change.  HENT: Positive for postnasal drip, rhinorrhea, sinus pressure, sneezing and sore throat. Negative for congestion, dental problem, ear pain, nosebleeds and trouble swallowing.   Eyes: Negative for redness and itching.  Respiratory: Positive for chest tightness and shortness of breath. Negative for cough and wheezing.   Cardiovascular: Negative for palpitations and leg swelling.  Gastrointestinal: Negative for nausea and vomiting.  Genitourinary: Negative for dysuria.  Musculoskeletal: Negative for joint swelling.  Skin: Negative for rash.  Neurological: Negative for headaches.  Hematological: Does not bruise/bleed easily.   Psychiatric/Behavioral: Negative for dysphoric mood. The patient is not nervous/anxious.        Objective:   Physical Exam  Filed Vitals:   10/22/14 1211  BP: 122/68  Pulse: 61  Height: '5\' 7"'$  (1.702 m)  Weight: 222 lb (100.699 kg)  SpO2: 98%    Vitals reviewed. Constitutional: She is oriented  to person, place, and time. She appears well-developed and well-nourished. No distress.  Body mass index is 34.76 kg/(m^2).    HENT:  Head: Normocephalic and atraumatic.  Right Ear: External ear normal.  Left Ear: External ear normal.  Mouth/Throat: Oropharynx is clear and moist. No oropharyngeal exudate.  Laryngeal cough and Clearing of throat - absent this visit for the first time ever   Eyes: Conjunctivae and EOM are normal. Pupils are equal, round, and reactive to light. Right eye exhibits no discharge. Left eye exhibits no discharge. No scleral icterus.  Neck: Normal range of motion. Neck supple. No JVD present. No tracheal deviation present. No thyromegaly present.  Cardiovascular: Normal rate, regular rhythm, normal heart sounds and intact distal pulses.  Exam reveals no gallop and no friction rub.   No murmur heard. Pulmonary/Chest: Effort normal and breath sounds normal. No respiratory distress. She has no wheezes. She has no rales. She exhibits no tenderness.  Abdominal: Soft. Bowel sounds are normal. She exhibits no distension and no mass. There is no tenderness. There is no rebound and no guarding.  Musculoskeletal: Normal range of motion. She exhibits no edema and no tenderness.  Lymphadenopathy:    She has no cervical adenopathy.  Neurological: She is alert and oriented to person, place, and time. She has normal reflexes. No cranial nerve deficit. She exhibits normal muscle tone. Coordination normal.  Skin: Skin is warm and dry. No rash noted. She is not diaphoretic. No erythema. No pallor.  Psychiatric: She has a normal mood and affect. Her behavior is normal. Judgment and  thought content normal.       Assessment & Plan:     ICD-9-CM ICD-10-CM   1. Chronic cough 786.2 R05 CT Chest High Resolution  2. ILD (interstitial lung disease) 515 J84.9 CT Chest High Resolution    Glad you are better with cough Need to assess status of lung findings from sept 2015 with ILD which likely was aspiration related  - repeat HRCT chest nowl  Followup  IF CT scan of the chest is fine then we will release her to follow Dr. Deforest Hoyles otherwise we will have you come back and start ILD workup     Dr. Brand Males, M.D., [facility].C.P Pulmonary and Critical Care Medicine Staff Physician [facility] Pager: [phone], If no answer or between  15:00h - 7:00h: call [phone]  10/23/2014 8:45 AM

## 2014-10-22 NOTE — Patient Instructions (Addendum)
ICD-9-CM ICD-10-CM   1. Chronic cough 786.2 R05   2. ILD (interstitial lung disease) 515 J84.9    Glad you are better with cough Need to assess status of lung findings from sept 2015 with ILD which likely was aspiration related  - repeat HRCT chest nowl  Followup  IF CT scan of the chest is fine then we will release her to follow Dr. Eula ListenHussain otherwise we will have you come back and start ILD workup

## 2014-10-25 ENCOUNTER — Other Ambulatory Visit: Payer: PPO

## 2014-10-26 ENCOUNTER — Ambulatory Visit (INDEPENDENT_AMBULATORY_CARE_PROVIDER_SITE_OTHER)
Admission: RE | Admit: 2014-10-26 | Discharge: 2014-10-26 | Disposition: A | Discharge status: Home / Self Care | Payer: PPO | Source: Ambulatory Visit | Attending: Internal Medicine | Admitting: Internal Medicine

## 2014-10-26 DIAGNOSIS — R053 Chronic cough: Secondary | ICD-10-CM

## 2014-10-26 DIAGNOSIS — R05 Cough: Secondary | ICD-10-CM | POA: Diagnosis not present

## 2014-10-26 DIAGNOSIS — J849 Interstitial pulmonary disease, unspecified: Secondary | ICD-10-CM | POA: Diagnosis not present

## 2014-11-04 ENCOUNTER — Telehealth: Payer: Self-pay | Admitting: Internal Medicine

## 2014-11-04 DIAGNOSIS — J849 Interstitial pulmonary disease, unspecified: Secondary | ICD-10-CM

## 2014-11-04 NOTE — Telephone Encounter (Signed)
CT 10/26/14 still shows ILD  Have her do   Serum: ESR, ANA, ACE,  DS-DNA, RF, anti-CCP, ssA, ssB, scl-70, ANCA screen, MPO, PR-3,  Total CK,  Aldolase,  Hypersensitivity Pneumonitis Panel  Do full PFT  Return to see me a week after blood work

## 2014-11-05 NOTE — Telephone Encounter (Signed)
Called and spoke to pt. Informed pt of the results and recs per MR. Orders placed and PFT scheduled for 4/28, appt with MR on 5/2. Pt verbalized understanding and denied any further questions or concerns at this time.

## 2014-11-07 ENCOUNTER — Other Ambulatory Visit: Payer: Self-pay | Admitting: Internal Medicine

## 2014-11-07 DIAGNOSIS — R0600 Dyspnea, unspecified: Secondary | ICD-10-CM

## 2014-11-08 ENCOUNTER — Ambulatory Visit (INDEPENDENT_AMBULATORY_CARE_PROVIDER_SITE_OTHER): Payer: PPO | Admitting: Internal Medicine

## 2014-11-08 DIAGNOSIS — R0600 Dyspnea, unspecified: Secondary | ICD-10-CM

## 2014-11-08 LAB — PULMONARY FUNCTION TEST
DL/VA % pred: 112 %
DL/VA: 5.79 ml/min/mmHg/L
DLCO unc % pred: 70 %
DLCO unc: 20.01 ml/min/mmHg
FEF 25-75 Post: 2.5 L/sec
FEF 25-75 Pre: 2.25 L/sec
FEF2575-%Change-Post: 11 %
FEF2575-%Pred-Post: 107 %
FEF2575-%Pred-Pre: 96 %
FEV1-%Change-Post: 1 %
FEV1-%Pred-Post: 77 %
FEV1-%Pred-Pre: 75 %
FEV1-Post: 1.84 L
FEV1-Pre: 1.81 L
FEV1FVC-%Change-Post: 4 %
FEV1FVC-%Pred-Pre: 107 %
FEV6-%Change-Post: -1 %
FEV6-%Pred-Post: 69 %
FEV6-%Pred-Pre: 70 %
FEV6-Post: 2.06 L
FEV6-Pre: 2.09 L
FEV6FVC-%Change-Post: 0 %
FEV6FVC-%Pred-Post: 103 %
FEV6FVC-%Pred-Pre: 102 %
FVC-%Change-Post: -2 %
FVC-%Pred-Post: 67 %
FVC-%Pred-Pre: 69 %
FVC-Post: 2.06 L
FVC-Pre: 2.1 L
Post FEV1/FVC ratio: 89 %
Post FEV6/FVC ratio: 100 %
Pre FEV1/FVC ratio: 86 %
Pre FEV6/FVC Ratio: 100 %
RV % pred: 61 %
RV: 1.3 L
TLC % pred: 63 %
TLC: 3.51 L

## 2014-11-08 NOTE — Progress Notes (Signed)
PFT done today. 

## 2014-11-12 ENCOUNTER — Encounter: Payer: Self-pay | Admitting: Internal Medicine

## 2014-11-12 ENCOUNTER — Ambulatory Visit (INDEPENDENT_AMBULATORY_CARE_PROVIDER_SITE_OTHER): Payer: PPO | Admitting: Internal Medicine

## 2014-11-12 ENCOUNTER — Other Ambulatory Visit (INDEPENDENT_AMBULATORY_CARE_PROVIDER_SITE_OTHER): Payer: PPO

## 2014-11-12 VITALS — BP 120/72 | HR 73 | Ht 67.0 in | Wt 226.0 lb

## 2014-11-12 DIAGNOSIS — R05 Cough: Secondary | ICD-10-CM | POA: Diagnosis not present

## 2014-11-12 DIAGNOSIS — J849 Interstitial pulmonary disease, unspecified: Secondary | ICD-10-CM | POA: Diagnosis not present

## 2014-11-12 DIAGNOSIS — R053 Chronic cough: Secondary | ICD-10-CM

## 2014-11-12 LAB — SEDIMENTATION RATE: Sed Rate: 23 mm/hr — ABNORMAL HIGH (ref 0–22)

## 2014-11-12 LAB — RHEUMATOID FACTOR: Rhuematoid fact SerPl-aCnc: <10 IU/mL (ref <=14)

## 2014-11-12 NOTE — Patient Instructions (Addendum)
ICD-9-CM ICD-10-CM   1. Chronic cough 786.2 R05   2. ILD (interstitial lung disease) 515 J84.9    Glad you are better with cough ILD is mild but stable between Sept 2015 and April 2016  - cause likely aspiration  - autoimmune disease needs to be ruled out due to family history  PLAN Serum: ESR, ANA, ACE DS-DNA, RF, anti-CCP, ssA, ssB, scl-70, ANCA screen., MPO, PR-3 antibodies Total CK,  Aldolase,  Hypersensitivity Pneumonitis Panel  Will call with results to discuss next step and timing of CT

## 2014-11-12 NOTE — Progress Notes (Signed)
Subjective:    Patient ID: [name], female    DOB: [birth year], 59 y.o.   MRN: [id number]  HPI  OV 02/17/2011. 59 year old female. Obese. AA female. Remote smoker On cpap for OSA (pmd managing). REferred by Dr [name].    New visit for chronic cough. Preesent for many years. Insidious onset.  Progressively with more frequency past few years. Cough rated as moderate. Usually dry cough with associated barking quality. Occ mucus present; thick and clear. Cough worsened by milk products, change in weather and dust. Hx of prior ENT eval in [place] > 15 years ago: diagnosed as "vocal cords sticking together" and remote hx of sinus surgery Also diagnosed with GERD in [place] > 15 years ago; takes protonix.  [facility] Reflux Symptom INdex score is 39 and very c/w LPR cough(severe hoarsness of voice, clearing of throat, ecess throat mucus, post nasal drip, coughing after lying down or eating, choking epidoses, sensation of globus with food sticking in thorat) and associated heart burn. Cough so severe that she could not complete spirometry in office today  Has dyspnea too: few years, insidious onset. Progressive. Rates it as moderate. Dyspnea brought on by associated chest tightness and nasal drainage or even exertion like climbing flight of steps. Dyspnea relieved by rest. Cough and dyspnea associated with wheezing and constant yellow sinus drainage as well. Frequent nocturnal awakenings with choking present. Above resulted in asthma diagnosis versus tracheobronchiolotis > 15 years ago: on advair since then which she is unsure is helping. She herself is not convinced she has asthma.   Associated med intake shows tramadol (for shoulder pain and sicatica), fish oil/ flaxseed (2 years) and ACE inhibitor intake (been on lisniopril for > 10 years due to diabetes)   Pas med hx review fromn outside record notes "hx of asthma and vocal cord dysfunction and post nasal drip. Recalls frequent ER visits atleast one  per month (last er visit 2 months ago per hx) to [place]. Denies ICU admits, intubations for same.   REC Cough is from ACE Inhibitor, sinus drainage, vocal cord dysfunction, acid reflux and possible cough all conspiring to cause cyclical cough/LPR cough #Sinus drainage  - continue nasal steroid - refer to [facility] ENT Dr. Wilburn Miles or Dr [name] #Possible Acid Reflux  - continue protonix   - take diet sheet from [place] - avoid colas, spices, cheeses, spirits, red meats, beer, chocolates, fried foods etc.,   - sleep with head end of bed elevated  - eat small frequent meals  - do not go to bed for 3 hours after last meal - stop fish oil and flaxseed for now #Asthma   - continue advair for now - might have to consider changing over to something else later depending on ENT evaluation #Vocal Cord Dysfunction - see ENT doctor first  -later  will consider referral to speech therapy with [name] #BP  - stop lisinopril - take benicar 1 tablet daily - take sample, nurse will add it in med list #Followup - I will see you in 4-6 weeks.  - Reattempt spirometry with [name] at time of followup -Important you comply with advice 100% correctly   OV 03/31/11: Followup cough. SAw ENT 89/17./12: no anatomic path or VCD noticed at time of exam though patient noted to have choked.  She states that she was not happy with her experience at ENT visit and does not want to visit the same office again. She is compliant with  instructions except for diet though she has made improvements there. Off lisniopril. Follows sinus, gerd and astham advice. Does not like advair diskus. Overall cough is much better. She is happy with improvement. RSI cough score dropped from 39 to 14.  She sses her hoarsenss, clearing and pos nasal drip as mild-moderate  PFTS show no more flow volume loop issues. There is restriction with mixed obstruction. DLCO 67%   REC Cough is from sinus drainage, vocal cord dysfunction,  acid reflux and possible cough all conspiring to cause cyclical cough/LPR cough  #Sinus drainage  - continue nasal steroid - nurse will do script for generic fluticasone -  #Acid Reflux  - continue protonix   - take diet sheet from [place] [facility] - avoid colas, spices, cheeses, spirits, red meats, beer, chocolates, fried foods etc.,   - sleep with head end of bed elevated  - eat small frequent meals  - do not go to bed for 3 hours after last meal - cotninue to avoid fish oil and flaxseed   #Asthma   - continue advair but nurse will give you sample of HFA and teach you how to take it with spacer; she will do script as well  #Vocal Cord Dysfunction - referral to speech therapy with [name]  #BP  - will add lisinopril to allergy list - take benicar 1 tablet daily or the equivalent Dr [name] gives you  #Followup - I will see you in 8 weeks.  - -Important you comply with advice 100% correctly - Glad you are much better - Flu shot today please   OV 06/26/2011 Followup for chronic cough. She continues to do better. Subjectively feels much better compared to last visit. Although objectively her cough score has only reduced by 1 point. Today, RSI cough score is 13. Level III postnasal drip. Level to hoarseness of voice, cough after lying down, choking episodes, and annoying cough. Level I sensation of lump in throat and heartburn.  In talking to her she is compliant with her nasal steroid and her Protonix for acid reflux but not compliant with diet for acid reflux. She is on Advair HFA with spacer for presumed asthma and she feels this makes her cough less compared to Advair discus. We discussed more about her asthma history and she says that she was never sure she had asthma diagnosis in [place] it was a presumptive diagnosis. She is eager to come off Advair and give a trial without it. She also gives a history of allergies and allergy shots while living in [place] and she is open to an  allergy evaluation right now. In terms of cyclical cough she has finished speech therapy evaluation and graduated from the program. Overall she feels quality-of-life is acceptable although she would like to see cough improve further. So far we have not tried Neurontin    Past, Family, Social reviewed: no change since last visit except that Dr Deforest Hoyles is new PMD. She now states she has year round allergies. Recollects while in [place] used to take allergy shots twice a week 10 years ago. Since moving to [place] 4 years ago not seen an allergist. Laverle Hobby to see allergist. She feels she does not have asthma.  Cough is from sinus drainage, vocal cord dysfunction, acid reflux and possible asthma or allergiesh all working together to cause cyclical cough/LPR cough  #Sinus drainage  - continue nasal steroid - nurse will do script for generic fluticasone  -  #Acid Reflux  -  continue protonix  - contniue (REALLY IMPORANT IS DIET) diet sheet from [place] [employer] - avoid colas, spices, cheeses, spirits, red meats, beer, chocolates, fried foods etc.,  - sleep with head end of bed elevated  - eat small frequent meals  - do not go to bed for 3 hours after last meal  - cotninue to avoid fish oil and flaxseed  #Asthma  - since diagnosis of asthma was in doubt. Please stop Advair and just use albuterol as needed  -We'll do spirometry at followup  # allergies  - Please see Dr. Baird Lyons in our office for allergy evaluation; will do referral  #Vocal Cord Dysfunction  - congratulations on graduating from program with speech therapy [name]  #BP  - Continue losartan  #Followup  - I will see you in 8 weeks.  - -Important you comply with advice 100% correctly  - Glad you are much better  - Cough score worksheet at followup   OV 08/25/2011 Followup for chronic cough. She is off advair as advised. She is doing gerd control.  Ojectively her cough score is not reduced. Last visit score was 13 but now it is 14.  (Level III postnasal drip, and post nasal drip. . Level 2  hoarseness of voice and choking episodes. Level 1 - cough after lying down,and  sensation of lump in throat and heartburn). Howevefr, subjectively cough much improved. Still with post nasal drip. Has seen Dr Annamaria Boots for allergy 08/06/11 and 08/19/11. RAST serum profile negative except for elevated [place] IGE. Allergy skin test pending 09/16/11. On visit 08/19/11 to Dr Annamaria Boots had atypical chest pain - releived with ppi in office. EKG okay. Trop was normal. . Of note, Dr Annamaria Boots is also addressing her prior OSA issues.   ROS c/o anxiety, insomnia, also chest pains  - atypical  Spirometry  - fev1 1.74L/72%. Ratio 78    #Chest pains  - please see [facility] cardiology asap - > equivocal stress test and but clinically low probability February 2013; placed in clinical followup #Cough  - for sinus: follow Dr Annamaria Boots recommendation  - for acid reflux: follow medications and diet  - for possible asthma: stay off advair and once cardiology ok, please have methacholine challenge test  #Followup  - complete cardiology visit and methacholine challenge  - 4-6 weeks from now  09/16/11 with Dr Annamaria Boots Allergist- 30 yoF former smoker referred by Dr Chase Caller who has been seeing her for multifactorial cough, questioning an allergic component At last visit she had presented with chest pain, preventing planned allergy skin testing. Now following with cardiology and pending CT scan of her heart/Dr. Burt Knack. Still complains of nasal congestion. Able to use her CPAP more regularly when her nose is not stopped up. Unable to do methacholine challenge last month because her baseline is from a tree scores were too low. CPAP autotitration done/[facility]. Pending download. PFT- 09/11/11- FVC 2.15/68%, FEV1 1.59/ 64%, FEV1/FVC 0.80. FEF 25-75 % 0.37/ 15%   OV 03/21/2013 Followup chronic cough  - I have not seen her in nearly 15 months. She says that basically her cough resolved but  cough is no longer chronic and only occurs intermittently when she has "bronchitis". In the last year she's had antibiotics and prednisone for acute episodes of coughing at least 3 times. In between episodes she is asymptomatic. Currently she feels she's having one such episode in the past 3 weeks with chest congestion, cough with white mucus, fatigue and associated wheeze. Currently  RSI cough score is 21 and show severe cough.  - Of note, she has not had methacholine challenge test that I advised a year and half ago. She says that she does not have vocal cord dysfunction based on Dr. Mickie Hillier exam in August 2012 ENT physician   REC levaquin and pred burst  04/20/2013 Follow up and Med review  new med calendar - pt brought all meds with her today.  does report some sinus pressure, congestion, SOB, wheezing, chest tightness. Is feeling better but still gets into coughing fits that takes her a while to calm down. Strong odors seem to causes attacks along with laughing  She did have an attack in the office. Sats were normal however she has significant upper airway psuedowheezing w/ barking cough.  No fever , chest pain, over reflux , orthopnea or edema.  No using CPAP lately   REC top Advair . Add Chlor trimeton 92m 2 At bedtime   Change Loratadine 161min am.  Add Pepcid 2099mt bedtime   Avoid throat clearing , use sips of water.  Saline nasal rinses Twice daily  .  NO MINT PRODUCTS  GERD diet .  We are setting you up for a CT neck .  Restart CPAP At bedtime   follow up Dr. RamChase Caller 4 weeks.  Please contact office for sooner follow up if symptoms do not improve or worsen or seek emergency care    OV  05/26/2013 Chief Complaint  Patient presents with  . Follow-up    Pt c/o chest tightness, dry cough, wheezing and sob. Pt states this was some better while she was on prednisone. Last dose x 3 weeks ago.   Followup for chronic cough. She says her last 2-3 weeks that she's having  an acute bronchitis flare with yellow phlegm. She feels she needs antibiotics and prednisone. In fact in the time that she last saw me in September 2014 she's had another round of antibiotics and prednisone with her primary care physician Dr. HusDeforest Hoyleshe feels her chest is congested although in the office she is clearly having a barking cough, laryngeal spasm and upper airway noise intermittently with changes in voice. It is fairly significant. I reminded her that this is consistent with vocal cord dysfunction but she said that her ear nose throat surgeon cleared her for the same. Of note, she had a CT scan of the neck and this was normal.  07/26/2013 Follow up  2 month follow up - reports some  congestion, head congestion w/ light yellow mucus that is on/off. Has sinus drainage daily . Is not using claritin or chlortrimeton as recommended.  Was taken off ACE in past , now on Cozaar.   RSI cough score unchanged at 21  Had Esophagram on 1/8 w/ mild GERD/HH. No sign delay emptying .  No chest pain, orthopnea, edema , fever.  +hoarseness on/off.   She says she feels so much better with CPAP , using during daytime for naps and At bedtime  . Is amazed at how much she feels better.    OV 03/20/2014  Chief Complaint  Patient presents with  . Follow-up    Pt states she is unable to hold her voice as long when she sings. Pt c/o PND and prod cough, pt thinks its d/t allergies. Pt c/o chest soreness on right upper chest.    Followup chronic cough  - I have not seen the patient in nearly 10 months. Overall cough  was stable but recently she has had a flareup. I'm not so sure that she is followed instructions compliantly. It took me a while to understand the medication regimen but it appears that she takes dymista for  Sinuses but saline wash as needed, and not taking claritin, Advair as needed for her lungs, Protonix schedule followup acid reflux but Pepcid only as needed. She is only somewhat compliant with  the Neurontin. She continues to have sinus drainage, clearing of the throat and the laryngeal/barking cough that she feels is coming from the throat. This is associated with dyspnea and is of moderate severity, and some wheezing but she feels wheeze is upper airway  REC #Chronic cough  - I thnk cough is related to voice box issues and loss of neural control of the same - Please take prednisone 40 mg x1 day, then 30 mg x1 day, then 20 mg x1 day, then 10 mg x1 day, and then 5 mg x1 day and stop - refer Dr Ernestine Conrad of [facility] for chronic cough - for now continue current nasal, acid reflux and neurontin therapies - continue albuterol as needed ; for now hold off scheduled advair - repeat HRCT chest wo contrast for chronic cough  #FOllowup  3 months - after you see Dr Joya Gaskins of [facility]  Come sooner if there are problems o  OV 10/22/2014  Chief Complaint  Patient presents with  . Follow-up    Pt stated her cough has improved. Pt c/o hoarseness and chest tightness. Pt is wearing CPAP nightly and denies any issues with machine, mask or pressure.     FU chronic cough  - IN interim in Sept 2015 had CT chest : CT chest 03/26/14 shows worsening infiltreats in bases compaed to nov 2014. COncern for aspiration and referred  To GI. She does not know details of GI Visit but review of records shows she has gastroparesis and hs been started on reglan. Also she saw Dr Joya Gaskins of ENT in [place]. WIth above measure cough has resolved. /nearly resolved. No respiratory issues.    has a past medical history of HTN (hypertension); Obesity; Asthma; Sciatica; Rotator cuff tear; Anxiety; COPD (chronic obstructive pulmonary disease); Sleep apnea; Headache(784.0); Environmental allergies; High cholesterol; Cold extremity without peripheral vascular disease; Heart murmur; Chronic bronchitis; Vocal cord dysfunction; Exertional dyspnea; Type II diabetes mellitus; GERD (gastroesophageal reflux disease);  Arthritis; Sciatic nerve pain; Blood transfusion without reported diagnosis; and Hiatal hernia.   reports that she quit smoking about 18 years ago. Her smoking use included Cigarettes. She has a 25 pack-year smoking history. She has never used smokeless tobacco.   OV 11/12/2014  Chief Complaint  Patient presents with  . Follow-up    Pt here after PFT. Pt stated her breathing has slightly improved,she is less SOB. Pt stated her cough has also imrpoved. Pt has not yet had autoimmune panel.    Follow-up chronic cough with interstitial lung disease findings  As of last visit her cough resolved. Cough was related to aspiration. As soon as she started following gastroenterology advice cough resolved. However follow-up CT scan of the chest in April 2016 compared to fall 2015 showed persistent ILD findings as described below [personally reviewed image]. At this point she is asymptomatic. I had her do pulmonary function test and autoimmune workup and reports for follow-up. Of these she only completed pulmonary function test that shows restriction with slight reduction in diffusion capacity [some of the restriction can be due to  obesity]. She continues to be asymptomatic. She's not having reflux symptoms anymore. Of note, she forgot to have a autoimmune test. She does admit that her daughter has lupus   IMPRESSION: CT chest 10/29/2014 personally reviewed image  1. Stable findings of peribronchovascular ground-glass attenuation, predominantly in the basal portions of the lungs bilaterally, again favored to reflect nonspecific interstitial pneumonia (NSIP). 2. Mild air trapping, indicative of mild small airways disease.   Electronically Signed  By: Vinnie Langton M.D.  On: 10/29/2014 16:19   Pulmonary function test 10/31/2014 shows restriction with reduced diffusion capacity. FVC 2.1 L/69%, FEV1 1.8 L/75% and ratio of 86. No broncho-dilator response. Total lung capacity 3.5 L/63% and DLCO  20.0/70%   Review of Systems  Constitutional: Negative for fever and unexpected weight change.  HENT: Negative for congestion, dental problem, ear pain, nosebleeds, postnasal drip, rhinorrhea, sinus pressure, sneezing, sore throat and trouble swallowing.   Eyes: Negative for redness and itching.  Respiratory: Positive for cough and shortness of breath. Negative for chest tightness and wheezing.   Cardiovascular: Negative for palpitations and leg swelling.  Gastrointestinal: Negative for nausea and vomiting.  Genitourinary: Negative for dysuria.  Musculoskeletal: Negative for joint swelling.  Skin: Negative for rash.  Neurological: Negative for headaches.  Hematological: Does not bruise/bleed easily.  Psychiatric/Behavioral: Negative for dysphoric mood. The patient is not nervous/anxious.        Objective:   Physical Exam  Constitutional: She is oriented to person, place, and time. She appears well-developed and well-nourished. No distress.  obese  HENT:  Head: Normocephalic and atraumatic.  Right Ear: External ear normal.  Left Ear: External ear normal.  Mouth/Throat: Oropharynx is clear and moist. No oropharyngeal exudate.  Eyes: Conjunctivae and EOM are normal. Pupils are equal, round, and reactive to light. Right eye exhibits no discharge. Left eye exhibits no discharge. No scleral icterus.  Neck: Normal range of motion. Neck supple. No JVD present. No tracheal deviation present. No thyromegaly present.  Cardiovascular: Normal rate, regular rhythm, normal heart sounds and intact distal pulses.  Exam reveals no gallop and no friction rub.   No murmur heard. Pulmonary/Chest: Effort normal and breath sounds normal. No respiratory distress. She has no wheezes. She has no rales. She exhibits no tenderness.  No crackles  Abdominal: Soft. Bowel sounds are normal. She exhibits no distension and no mass. There is no tenderness. There is no rebound and no guarding.  Musculoskeletal:  Normal range of motion. She exhibits no edema or tenderness.  Lymphadenopathy:    She has no cervical adenopathy.  Neurological: She is alert and oriented to person, place, and time. She has normal reflexes. No cranial nerve deficit. She exhibits normal muscle tone. Coordination normal.  Skin: Skin is warm and dry. No rash noted. She is not diaphoretic. No erythema. No pallor.  Psychiatric:  Flat affect  Vitals reviewed.  Filed Vitals:   11/12/14 1337  BP: 120/72  Pulse: 73  Height: _0  (1.702 m)  Weight: 226 lb (102.513 kg)  SpO2: 97%        Assessment & Plan:     ICD-9-CM ICD-10-CM   1. Chronic cough 786.2 R05   2. ILD (interstitial lung disease) 515 J84.9    Glad you are better with cough ILD is mild but stable between Sept 2015 and April 2016  - cause likely aspiration  - autoimmune disease needs to be ruled out due to family history  PLAN Serum: ESR, ANA, ACE DS-DNA, RF, anti-CCP, ssA, ssB,  scl-70, ANCA screen., MPO, PR-3 antibodies Total CK,  Aldolase,  Hypersensitivity Pneumonitis Panel  Will call with results to discuss next step and timing of CT    Dr. Brand Males, M.D., [facility].C.P Pulmonary and Critical Care Medicine Staff Physician [facility] Pager: [phone], If no answer or between  15:00h - 7:00h: call [phone]  11/12/2014 2:02 PM

## 2014-11-13 LAB — SJOGRENS SYNDROME-A EXTRACTABLE NUCLEAR ANTIBODY: SSA (Ro) (ENA) Antibody, IgG: <1

## 2014-11-13 LAB — ANA: Anti Nuclear Antibody(ANA): POSITIVE — AB

## 2014-11-13 LAB — MPO/PR-3 (ANCA) ANTIBODIES
Myeloperoxidase Abs: <1
Serine Protease 3: <1

## 2014-11-13 LAB — SJOGRENS SYNDROME-B EXTRACTABLE NUCLEAR ANTIBODY: SSB (La) (ENA) Antibody, IgG: <1

## 2014-11-13 LAB — ANTI-NUCLEAR AB-TITER (ANA TITER): ANA Titer 1: NEGATIVE

## 2014-11-13 LAB — ANTI-SCLERODERMA ANTIBODY: Scleroderma (Scl-70) (ENA) Antibody, IgG: <1

## 2014-11-13 LAB — ANTI-DNA ANTIBODY, DOUBLE-STRANDED: ds DNA Ab: <1 IU/mL

## 2014-11-13 LAB — ANCA SCREEN W REFLEX TITER
Atypical p-ANCA Screen: NEGATIVE
c-ANCA Screen: NEGATIVE
p-ANCA Screen: NEGATIVE

## 2014-11-13 LAB — CYCLIC CITRUL PEPTIDE ANTIBODY, IGG: Cyclic Citrullin Peptide Ab: <2 U/mL (ref 0.0–5.0)

## 2014-11-13 LAB — ANGIOTENSIN CONVERTING ENZYME: Angiotensin-Converting Enzyme: 48 U/L (ref 8–52)

## 2014-11-14 ENCOUNTER — Other Ambulatory Visit: Payer: Self-pay | Admitting: Internal Medicine

## 2014-11-14 ENCOUNTER — Other Ambulatory Visit: Payer: Self-pay

## 2014-11-14 ENCOUNTER — Telehealth: Payer: Self-pay | Admitting: Internal Medicine

## 2014-11-14 DIAGNOSIS — Z1231 Encounter for screening mammogram for malignant neoplasm of breast: Secondary | ICD-10-CM

## 2014-11-14 DIAGNOSIS — J849 Interstitial pulmonary disease, unspecified: Secondary | ICD-10-CM

## 2014-11-14 NOTE — Telephone Encounter (Signed)
Let [name] know that blood work autoimmune is normal.  Plan ROV 6 months with HRCT for ILD  Let her know that I ran into her at [facility] ICU 11/14/2014

## 2014-11-14 NOTE — Telephone Encounter (Signed)
Called and spoke to pt. Informed pt of the results and recs per MR. Recall placed for appt. Order placed for CT. Pt verbalized understanding and denied any further questions or concerns at this time.

## 2014-11-16 LAB — CK TOTAL AND CKMB (NOT AT ARMC)
CK, MB: 1.7 ng/mL (ref 0.0–5.0)
Total CK: 92 U/L (ref 7–177)

## 2014-11-17 LAB — HYPERSENSITIVITY PNUEMONITIS PROFILE

## 2014-11-17 LAB — ALDOLASE: Aldolase: 5.1 U/L (ref <=8.1)

## 2014-11-22 ENCOUNTER — Other Ambulatory Visit: Payer: Self-pay | Admitting: Internal Medicine

## 2014-11-30 ENCOUNTER — Ambulatory Visit
Admission: RE | Admit: 2014-11-30 | Discharge: 2014-11-30 | Disposition: A | Discharge status: Home / Self Care | Payer: PPO | Source: Ambulatory Visit

## 2014-11-30 DIAGNOSIS — Z1231 Encounter for screening mammogram for malignant neoplasm of breast: Secondary | ICD-10-CM

## 2015-04-06 ENCOUNTER — Encounter (HOSPITAL_COMMUNITY): Payer: Self-pay | Admitting: Emergency Medicine

## 2015-04-06 ENCOUNTER — Emergency Department (HOSPITAL_COMMUNITY): Payer: PPO

## 2015-04-06 ENCOUNTER — Emergency Department (HOSPITAL_COMMUNITY)
Admission: EM | Admit: 2015-04-06 | Discharge: 2015-04-06 | Disposition: A | Discharge status: Home / Self Care | Payer: PPO | Attending: Emergency Medicine | Admitting: Emergency Medicine

## 2015-04-06 DIAGNOSIS — R011 Cardiac murmur, unspecified: Secondary | ICD-10-CM | POA: Diagnosis not present

## 2015-04-06 DIAGNOSIS — Z7951 Long term (current) use of inhaled steroids: Secondary | ICD-10-CM | POA: Diagnosis not present

## 2015-04-06 DIAGNOSIS — K219 Gastro-esophageal reflux disease without esophagitis: Secondary | ICD-10-CM | POA: Diagnosis not present

## 2015-04-06 DIAGNOSIS — G4733 Obstructive sleep apnea (adult) (pediatric): Secondary | ICD-10-CM | POA: Insufficient documentation

## 2015-04-06 DIAGNOSIS — E780 Pure hypercholesterolemia: Secondary | ICD-10-CM | POA: Diagnosis not present

## 2015-04-06 DIAGNOSIS — I10 Essential (primary) hypertension: Secondary | ICD-10-CM | POA: Diagnosis not present

## 2015-04-06 DIAGNOSIS — E669 Obesity, unspecified: Secondary | ICD-10-CM | POA: Diagnosis not present

## 2015-04-06 DIAGNOSIS — M1990 Unspecified osteoarthritis, unspecified site: Secondary | ICD-10-CM | POA: Insufficient documentation

## 2015-04-06 DIAGNOSIS — Z7982 Long term (current) use of aspirin: Secondary | ICD-10-CM | POA: Insufficient documentation

## 2015-04-06 DIAGNOSIS — R0789 Other chest pain: Secondary | ICD-10-CM | POA: Insufficient documentation

## 2015-04-06 DIAGNOSIS — Z9981 Dependence on supplemental oxygen: Secondary | ICD-10-CM | POA: Diagnosis not present

## 2015-04-06 DIAGNOSIS — E119 Type 2 diabetes mellitus without complications: Secondary | ICD-10-CM | POA: Diagnosis not present

## 2015-04-06 DIAGNOSIS — F419 Anxiety disorder, unspecified: Secondary | ICD-10-CM | POA: Insufficient documentation

## 2015-04-06 DIAGNOSIS — Z794 Long term (current) use of insulin: Secondary | ICD-10-CM | POA: Insufficient documentation

## 2015-04-06 DIAGNOSIS — Z87891 Personal history of nicotine dependence: Secondary | ICD-10-CM | POA: Insufficient documentation

## 2015-04-06 DIAGNOSIS — R0602 Shortness of breath: Secondary | ICD-10-CM | POA: Diagnosis present

## 2015-04-06 DIAGNOSIS — J441 Chronic obstructive pulmonary disease with (acute) exacerbation: Secondary | ICD-10-CM | POA: Diagnosis not present

## 2015-04-06 DIAGNOSIS — Z79899 Other long term (current) drug therapy: Secondary | ICD-10-CM | POA: Insufficient documentation

## 2015-04-06 LAB — BASIC METABOLIC PANEL
Anion gap: 8 (ref 5–15)
BUN: 11 mg/dL (ref 6–20)
CO2: 28 mmol/L (ref 22–32)
Calcium: 9.2 mg/dL (ref 8.9–10.3)
Chloride: 103 mmol/L (ref 101–111)
Creatinine, Ser: 0.96 mg/dL (ref 0.44–1.00)
GFR calc Af Amer: >60 mL/min (ref >60)
GFR calc non Af Amer: >60 mL/min (ref >60)
Glucose, Bld: 82 mg/dL (ref 65–99)
Potassium: 4 mmol/L (ref 3.5–5.1)
Sodium: 139 mmol/L (ref 135–145)

## 2015-04-06 LAB — CBC
HCT: 37.3 % (ref 36.0–46.0)
Hemoglobin: 11.9 g/dL — ABNORMAL LOW (ref 12.0–15.0)
MCH: 26.3 pg (ref 26.0–34.0)
MCHC: 31.9 g/dL (ref 30.0–36.0)
MCV: 82.3 fL (ref 78.0–100.0)
Platelets: 245 10*3/uL (ref 150–400)
RBC: 4.53 MIL/uL (ref 3.87–5.11)
RDW: 14.7 % (ref 11.5–15.5)
WBC: 5.7 10*3/uL (ref 4.0–10.5)

## 2015-04-06 LAB — I-STAT TROPONIN, ED: Troponin i, poc: 0 ng/mL (ref 0.00–0.08)

## 2015-04-06 MED ORDER — PREDNISONE 20 MG PO TABS
60.0000 mg | ORAL_TABLET | Freq: Once | ORAL | Status: AC
  Administered 2015-04-06: 60 mg via ORAL
  Filled 2015-04-06: qty 3

## 2015-04-06 MED ORDER — IPRATROPIUM-ALBUTEROL 0.5-2.5 (3) MG/3ML IN SOLN
3.0000 mL | RESPIRATORY_TRACT | Status: AC
  Administered 2015-04-06: 3 mL via RESPIRATORY_TRACT
  Filled 2015-04-06: qty 3

## 2015-04-06 MED ORDER — AZITHROMYCIN 250 MG PO TABS: ORAL_TABLET | ORAL | Status: DC

## 2015-04-06 MED ORDER — ALBUTEROL (5 MG/ML) CONTINUOUS INHALATION SOLN
10.0000 mg/h | INHALATION_SOLUTION | Freq: Once | RESPIRATORY_TRACT | Status: AC
  Administered 2015-04-06: 10 mg/h via RESPIRATORY_TRACT
  Filled 2015-04-06: qty 20

## 2015-04-06 MED ORDER — PREDNISONE 10 MG PO TABS: 60.0000 mg | ORAL_TABLET | Freq: Every day | ORAL | Status: AC

## 2015-04-06 MED ORDER — ALBUTEROL SULFATE HFA 108 (90 BASE) MCG/ACT IN AERS
2.0000 | INHALATION_SPRAY | Freq: Once | RESPIRATORY_TRACT | Status: AC
  Administered 2015-04-06: 2 via RESPIRATORY_TRACT
  Filled 2015-04-06: qty 6.7

## 2015-04-06 NOTE — ED Notes (Signed)
Patient with chest pain and shortness of breath.  Patient states that she has had this for awhile and increasingly getting worse today. Patient with wheezing and coughing in triage.  Patient with productive cough.

## 2015-04-06 NOTE — Discharge Instructions (Signed)
Chronic Asthmatic Bronchitis Chronic asthmatic bronchitis is a complication of persistent asthma. After a period of time with asthma, some people develop airflow obstruction that is present all the time, even when not having an asthma attack.There is also persistent inflammation of the airways, and the bronchial tubes produce more mucus. Chronic asthmatic bronchitis usually is a permanent problem with the lungs. CAUSES  Chronic asthmatic bronchitis happens most often in people who have asthma and also smoke cigarettes. Occasionally, it can happen to a person with long-standing or severe asthma even if the person is not a smoker. SIGNS AND SYMPTOMS  Chronic asthmatic bronchitis usually causes symptoms of both asthma and chronic bronchitis, including:   Coughing.  Increased sputum production.  Wheezing and shortness of breath.  Chest discomfort.  Recurring infections. DIAGNOSIS  Your health care provider will take a medical history and perform a physical exam. Chronic asthmatic bronchitis is suspected when a person with asthma has abnormal results on breathing tests (pulmonary function tests) even when breathing symptoms are at their best. Other tests, such as a chest X-ray, may be performed to rule out other conditions.  TREATMENT  Treatment involves controlling symptoms with medicine and lifestyle changes.  Your health care provider may prescribe asthma medicines, including inhaler and nebulizer medicines.  Infection can be treated with medicine to kill germs (antibiotics). Serious infections may require hospitalization. These can include:  Pneumonia.  Sinus infections.  Acute bronchitis.   Preventing infection and hospitalization is very important. Get an influenza vaccination every year as directed by your health care provider. Ask your health care provider whether you need a pneumonia vaccine.  Ask your health care provider whether you would benefit from a pulmonary  rehabilitation program. HOME CARE INSTRUCTIONS  Take medicines only as directed by your health care provider.  If you are a cigarette smoker, the most important thing that you can do is quit. Talk to your health care provider for help with quitting smoking.  Avoid pollen, dust, animal dander, molds, smoke, and other things that cause attacks.  Regular exercise is very important to help you feel better. Discuss possible exercise routines with your health care provider.  If animal dander is the cause of asthma, you may not be able to keep pets.  It is important that you:  Become educated about your medical condition.  Participate in maintaining wellness.  Seek medical care as directed. Delay in seeking medical care could cause permanent injury and may be a risk to your life. SEEK MEDICAL CARE IF:  You have wheezing and shortness of breath even if taking medicine to prevent attacks.  You have muscle aches, chest pain, or thickening of sputum.  Your sputum changes from clear or white to yellow, green, gray, or bloody. SEEK IMMEDIATE MEDICAL CARE IF:  Your usual medicines do not stop your wheezing.  You have increased coughing or shortness of breath or both.  You have increased difficulty breathing.  You have any problems from the medicine you are taking, such as a rash, itching, swelling, or trouble breathing. MAKE SURE YOU:   Understand these instructions.  Will watch your condition.  Will get help right away if you are not doing well or get worse. Document Released: 04/16/2006 Document Revised: 11/13/2013 Document Reviewed: 08/07/2013 [employer] Patient Information 2015 [employer]. This information is not intended to replace advice given to you by your health care provider. Make sure you discuss any questions you have with your health care provider.  

## 2015-04-06 NOTE — ED Provider Notes (Signed)
CSN: [id number]     Arrival date & time 04/06/15  1923 History   First MD Initiated Contact with Patient 04/06/15 1939     Chief Complaint  Patient presents with  . Chest Pain  . Shortness of Breath    Patient is a 59 y.o. female presenting with shortness of breath. The history is provided by the patient, the spouse, a relative and medical records.  Shortness of Breath Severity:  Severe Onset quality:  Gradual Duration:  3 days Timing:  Constant Progression:  Worsening Chronicity:  New Context: URI   Relieved by:  Oxygen Worsened by:  Coughing Ineffective treatments:  Inhaler (Pt states she does not know how to use her current inhaler so she has not been taking it) Associated symptoms: chest pain (chest feels tight), cough, sputum production and wheezing   Associated symptoms: no abdominal pain, no fever, no hemoptysis, no rash and no vomiting   Risk factors comment:  Hx ILD, chronic cough and bronchitis   Past Medical History  Diagnosis Date  . HTN (hypertension)   . Obesity   . Asthma   . Sciatica   . Rotator cuff tear   . Anxiety   . COPD (chronic obstructive pulmonary disease)   . Sleep apnea     CPAP, sleep study on [facility]  . Headache(784.0)   . Environmental allergies     "year round"  . High cholesterol   . Cold extremity without peripheral vascular disease     bilaterally; "from my knees down into my feet; they get ice cold"  . Heart murmur   . Chronic bronchitis   . Vocal cord dysfunction     "I've had it for many years"  . Exertional dyspnea   . Type II diabetes mellitus   . GERD (gastroesophageal reflux disease)   . Arthritis     "both shoulders"  . Sciatic nerve pain     "tailbone down into my legs when it flares up"  . Blood transfusion without reported diagnosis   . Hiatal hernia    Past Surgical History  Procedure Laterality Date  . Nasal sinus surgery  1992  . Toe surgery      for ingrown [id number] ?left  . Shoulder arthroscopy w/ rotator  cuff repair  01/28/12    left  . Appendectomy  1976  . Oophorectomy  1976    w/ovarian cyst excision and appy  . Total abdominal hysterectomy  1985  . Abdominal adhesion surgery  ~ 1978   Family History  Problem Relation Age of Onset  . Heart disease Mother   . Asthma Mother   . Heart failure Maternal Grandmother   . Asthma Son   . Asthma Daughter   . Lupus Daughter   . Colon cancer Neg Hx   . Throat cancer Neg Hx   . Stomach cancer Neg Hx   . Esophageal cancer Neg Hx   . Rectal cancer Neg Hx   . Diabetes      "everybody on both sides"   Social History  Substance Use Topics  . Smoking status: Former Smoker -- 1.00 packs/day for 25 years    Types: Cigarettes    Quit date: 07/13/1996  . Smokeless tobacco: Never Used  . Alcohol Use: No     Comment: 01/29/12 "used to abuse alcohol; last drink was in the 1980's"   OB History    No data available     Review of Systems  Constitutional: Negative for fever.  HENT: Negative for rhinorrhea.   Eyes: Negative for visual disturbance.  Respiratory: Positive for cough, sputum production, shortness of breath and wheezing. Negative for hemoptysis.   Cardiovascular: Positive for chest pain (chest feels tight).  Gastrointestinal: Negative for vomiting and abdominal pain.  Genitourinary: Negative for decreased urine volume.  Skin: Negative for rash.  Allergic/Immunologic: Negative for immunocompromised state.  Neurological: Negative for syncope.  Psychiatric/Behavioral: Negative for confusion.    Allergies  Lisinopril  Home Medications   Prior to Admission medications   Medication Sig Start Date End Date Taking? Authorizing Provider  albuterol (PROVENTIL HFA;VENTOLIN HFA) 108 (90 BASE) MCG/ACT inhaler Inhale 2 puffs into the lungs every 4 (four) hours as needed for wheezing or shortness of breath.     Historical Provider, MD  ALPRAZolam Prudy Feeler) 0.5 MG tablet Take 0.5 mg by mouth 2 (two) times daily as needed.     Historical  Provider, MD  aspirin 81 MG tablet Take 81 mg by mouth 2 (two) times daily.     Historical Provider, MD  atorvastatin (LIPITOR) 20 MG tablet Take 20 mg by mouth every evening.     Historical Provider, MD  azithromycin (ZITHROMAX Z-PAK) 250 MG tablet Take two tablets the first day. Then, take one tablet a day for the rest of the pack 04/06/15   Urban Gibson, MD  chlorpheniramine (CHLOR-TRIMETON) 4 MG tablet 2 tabs by mouth at bedtime    Historical Provider, MD  citalopram (CELEXA) 10 MG tablet Take 1 tablet by mouth daily. 05/08/13   Historical Provider, MD  cyclobenzaprine (FLEXERIL) 10 MG tablet Take 1 tablet by mouth as needed.    Historical Provider, MD  gabapentin (NEURONTIN) 300 MG capsule Take 1 capsule by mouth 3 (three) times daily.  05/08/13   Historical Provider, MD  HUMULIN N KWIKPEN 100 UNIT/ML Kiwkpen Inject 30 Units into the skin 2 (two) times daily.    Historical Provider, MD  Linaclotide Karlene Einstein) 290 MCG CAPS capsule Take 1 capsule (290 mcg total) by mouth daily. 08/22/14   Beverley Fiedler, MD  loratadine (CLARITIN) 10 MG tablet Take 10 mg by mouth every morning.    Historical Provider, MD  losartan (COZAAR) 100 MG tablet Take 1 tablet by mouth daily.    Historical Provider, MD  metFORMIN (GLUCOPHAGE) 1000 MG tablet Take 1 tablet by mouth 2 (two) times daily. 04/15/13   Historical Provider, MD  metoCLOPramide (REGLAN) 5 MG/5ML solution Take 5 mLs (5 mg total) by mouth 4 (four) times daily -  before meals and at bedtime. 09/13/14   Lori P Hvozdovic, PA-C  mometasone (NASONEX) 50 MCG/ACT nasal spray Place 2 sprays into the nose every morning.     Historical Provider, MD  pantoprazole (PROTONIX) 40 MG tablet  06/20/14   Historical Provider, MD  predniSONE (DELTASONE) 10 MG tablet Take 6 tablets (60 mg total) by mouth daily. 04/07/15 04/09/15  Urban Gibson, MD  traMADol (ULTRAM) 50 MG tablet Take 50 mg by mouth every 6 (six) hours as needed for pain.     Historical Provider, MD  VICTOZA 18 MG/3ML  SOPN Inject 18 mg into the skin every morning.  03/14/13   Historical Provider, MD   BP 112/53 mmHg  Pulse 81  Temp(Src) 98.5 F (36.9 C) (Oral)  Resp 25  SpO2 97% Physical Exam  Constitutional: She is oriented to person, place, and time. She appears well-developed and well-nourished. No distress.  HENT:  Head: Normocephalic and atraumatic.  Eyes: Right eye exhibits no discharge.  Left eye exhibits no discharge.  Neck: No tracheal deviation present.  Cardiovascular: Normal rate and regular rhythm.   Pulmonary/Chest: She is in respiratory distress. She has wheezes (coarse rhonchi bilat).  Abdominal: Soft. She exhibits no distension. There is no tenderness.  Musculoskeletal: She exhibits no edema.  Neurological: She is alert and oriented to person, place, and time.  Skin: Skin is warm and dry.  Psychiatric: She has a normal mood and affect. Her behavior is normal.    ED Course  Procedures (including critical care time) Labs Review Labs Reviewed  CBC - Abnormal; Notable for the following:    Hemoglobin 11.9 (*)    All other components within normal limits  BASIC METABOLIC PANEL  I-STAT TROPOININ, ED    Imaging Review Dg Chest Portable 1 View  04/06/2015   CLINICAL DATA:  Chest pain and shortness of breath. Chest congestion for 1 day. Ex-smoker.  EXAM: PORTABLE CHEST 1 VIEW  COMPARISON:  06/16/2013 and Chest CT dated 10/26/2014.  FINDINGS: The cardiac silhouette remains borderline enlarged. The pulmonary vasculature remains prominent. Clear lungs. Unremarkable bones.  IMPRESSION: No acute abnormality. Stable borderline cardiomegaly and pulmonary vascular congestion.   Electronically Signed   By: Beckie Salts M.D.   On: 04/06/2015 20:16   I have personally reviewed and evaluated these images and lab results as part of my medical decision-making.   EKG Interpretation   Date/Time:  Saturday April 06 2015 19:33:50 EDT Ventricular Rate:  79 PR Interval:  142 QRS Duration: 68 QT  Interval:  388 QTC Calculation: 444 R Axis:   91 Text Interpretation:  Normal sinus rhythm Rightward axis Confirmed by  Denton Lank  MD, Caryn Bee ([id number]) on 04/06/2015 9:58:36 PM      MDM   Final diagnoses:  COPD exacerbation    59 yo F with hx DM, HTN, chronic bronchitis, ILD, chronic cough, OSA on CPAP at night, GERD, asthma presenting with SOB and chest tightness as above. AFVSS on arrival. Lungs with coarse sounds and wheezing bilat. Trop neg and no changes on EKG to suggest ischemia, doubt ACS or other primary cardiac disease as etiology for presentation. Electrolytes wnl and no leukocytosis, no consolidation on CXR, doubt PNA. Improved markedly with nebulized tx and steroids. Given pt does not currently have a working inhaler ordered MDI and given to pt at discharge. Dc with short course of steroids and antibiotics for COPD exacerbation. Strict return precautions.   Pt discussed with Dr. Denton Lank, who oversaw management of this patient.     Urban Gibson, MD 04/06/15 [id number]  Cathren Laine, MD 04/06/15 [phone]

## 2015-04-06 NOTE — ED Notes (Signed)
Pt remains monitored by blood pressure, pulse ox, and 5 lead. Pts family remains at bedside.  

## 2015-06-03 ENCOUNTER — Ambulatory Visit (INDEPENDENT_AMBULATORY_CARE_PROVIDER_SITE_OTHER)
Admission: RE | Admit: 2015-06-03 | Discharge: 2015-06-03 | Disposition: A | Discharge status: Home / Self Care | Payer: PPO | Source: Ambulatory Visit | Attending: Internal Medicine | Admitting: Internal Medicine

## 2015-06-03 DIAGNOSIS — J849 Interstitial pulmonary disease, unspecified: Secondary | ICD-10-CM

## 2015-06-10 ENCOUNTER — Encounter: Payer: Self-pay | Admitting: Internal Medicine

## 2015-06-10 ENCOUNTER — Ambulatory Visit (INDEPENDENT_AMBULATORY_CARE_PROVIDER_SITE_OTHER): Payer: PPO | Admitting: Internal Medicine

## 2015-06-10 VITALS — BP 126/60 | HR 67 | Ht 67.0 in | Wt 222.6 lb

## 2015-06-10 DIAGNOSIS — J849 Interstitial pulmonary disease, unspecified: Secondary | ICD-10-CM | POA: Diagnosis not present

## 2015-06-10 NOTE — Patient Instructions (Signed)
ICD-9-CM ICD-10-CM   1. ILD (interstitial lung disease) (HCC) 515 J84.9    You have very mild pulmonary fibrosis in the bottom part of your lungs on both sides There are 100 varieties of this pulmonary fibrosis otherwise called interstitial lung disease Based on blood work and CT scan of the chest I do not know which variety you have In an ideal world you need a surgical lung biopsy to sort this out However , Most likely this is related to your gastroparesis and acid reflux Nevertheless it is stable in the past 2 years since November 2014 through November 2016  Plan - I have written to the physician assistant in gastroenterology to continue to work on controlling her acid reflux  Follow-up - I will see you in one year with HRCT scan of the chest and breathing test - In between if your shortness of breath is getting worse or cough is getting worse please come back sooner and we will make arrangements for surgical lung biopsy

## 2015-06-10 NOTE — Progress Notes (Signed)
Subjective:     Patient ID: [name], female   DOB: 1956-03-04, 59 y.o.   MRN: [id number]  HPI  OV 02/17/2011. 59 year old female. Obese. AA female. Remote smoker On cpap for OSA (pmd managing). REferred by Dr Delfina Redwood.    New visit for chronic cough. Preesent for many years. Insidious onset.  Progressively with more frequency past few years. Cough rated as moderate. Usually dry cough with associated barking quality. Occ mucus present; thick and clear. Cough worsened by milk products, change in weather and dust. Hx of prior ENT eval in [place] > 15 years ago: diagnosed as "vocal cords sticking together" and remote hx of sinus surgery Also diagnosed with GERD in [place] > 15 years ago; takes protonix.  [facility] Reflux Symptom INdex score is 39 and very c/w LPR cough(severe hoarsness of voice, clearing of throat, ecess throat mucus, post nasal drip, coughing after lying down or eating, choking epidoses, sensation of globus with food sticking in thorat) and associated heart burn. Cough so severe that she could not complete spirometry in office today  Has dyspnea too: few years, insidious onset. Progressive. Rates it as moderate. Dyspnea brought on by associated chest tightness and nasal drainage or even exertion like climbing flight of steps. Dyspnea relieved by rest. Cough and dyspnea associated with wheezing and constant yellow sinus drainage as well. Frequent nocturnal awakenings with choking present. Above resulted in asthma diagnosis versus tracheobronchiolotis > 15 years ago: on advair since then which she is unsure is helping. She herself is not convinced she has asthma.   Associated med intake shows tramadol (for shoulder pain and sicatica), fish oil/ flaxseed (2 years) and ACE inhibitor intake (been on lisniopril for > 10 years due to diabetes)   Pas med hx review fromn outside record notes "hx of asthma and vocal cord dysfunction and post nasal drip. Recalls frequent ER visits atleast one per  month (last er visit 2 months ago per hx) to [place]. Denies ICU admits, intubations for same.   REC Cough is from ACE Inhibitor, sinus drainage, vocal cord dysfunction, acid reflux and possible cough all conspiring to cause cyclical cough/LPR cough #Sinus drainage  - continue nasal steroid - refer to [facility] Dr. Wilburn Cornelia or Dr Redmond Baseman #Possible Acid Reflux  - continue protonix   - take diet sheet from [place] - avoid colas, spices, cheeses, spirits, red meats, beer, chocolates, fried foods etc.,   - sleep with head end of bed elevated  - eat small frequent meals  - do not go to bed for 3 hours after last meal - stop fish oil and flaxseed for now #Asthma   - continue advair for now - might have to consider changing over to something else later depending on ENT evaluation #Vocal Cord Dysfunction - see ENT doctor first  -later  will consider referral to speech therapy with [name] #BP  - stop lisinopril - take benicar 1 tablet daily - take sample, nurse will add it in med list #Followup - I will see you in 4-6 weeks.  - Reattempt spirometry with [name] at time of followup -Important you comply with advice 100% correctly   OV 03/31/11: Followup cough. SAw ENT 89/17./12: no anatomic path or VCD noticed at time of exam though patient noted to have choked.  She states that she was not happy with her experience at ENT visit and does not want to visit the same office again. She is compliant with instructions  except for diet though she has made improvements there. Off lisniopril. Follows sinus, gerd and astham advice. Does not like advair diskus. Overall cough is much better. She is happy with improvement. RSI cough score dropped from 39 to 14.  She sses her hoarsenss, clearing and pos nasal drip as mild-moderate  PFTS show no more flow volume loop issues. There is restriction with mixed obstruction. DLCO 67%   REC Cough is from sinus drainage, vocal cord dysfunction, acid  reflux and possible cough all conspiring to cause cyclical cough/LPR cough  #Sinus drainage  - continue nasal steroid - nurse will do script for generic fluticasone -  #Acid Reflux  - continue protonix   - take diet sheet from [place] [facility] - avoid colas, spices, cheeses, spirits, red meats, beer, chocolates, fried foods etc.,   - sleep with head end of bed elevated  - eat small frequent meals  - do not go to bed for 3 hours after last meal - cotninue to avoid fish oil and flaxseed   #Asthma   - continue advair but nurse will give you sample of HFA and teach you how to take it with spacer; she will do script as well  #Vocal Cord Dysfunction - referral to speech therapy with [name]  #BP  - will add lisinopril to allergy list - take benicar 1 tablet daily or the equivalent Dr Delfina Redwood gives you  #Followup - I will see you in 8 weeks.  - -Important you comply with advice 100% correctly - Glad you are much better - Flu shot today please   OV 06/26/2011 Followup for chronic cough. She continues to do better. Subjectively feels much better compared to last visit. Although objectively her cough score has only reduced by 1 point. Today, RSI cough score is 13. Level III postnasal drip. Level to hoarseness of voice, cough after lying down, choking episodes, and annoying cough. Level I sensation of lump in throat and heartburn.  In talking to her she is compliant with her nasal steroid and her Protonix for acid reflux but not compliant with diet for acid reflux. She is on Advair HFA with spacer for presumed asthma and she feels this makes her cough less compared to Advair discus. We discussed more about her asthma history and she says that she was never sure she had asthma diagnosis in [place] it was a presumptive diagnosis. She is eager to come off Advair and give a trial without it. She also gives a history of allergies and allergy shots while living in [place] and she is open to an  allergy evaluation right now. In terms of cyclical cough she has finished speech therapy evaluation and graduated from the program. Overall she feels quality-of-life is acceptable although she would like to see cough improve further. So far we have not tried Neurontin    Past, Family, Social reviewed: no change since last visit except that Dr Deforest Hoyles is new PMD. She now states she has year round allergies. Recollects while in [place] used to take allergy shots twice a week 10 years ago. Since moving to [place] 4 years ago not seen an allergist. Laverle Hobby to see allergist. She feels she does not have asthma.  Cough is from sinus drainage, vocal cord dysfunction, acid reflux and possible asthma or allergiesh all working together to cause cyclical cough/LPR cough  #Sinus drainage  - continue nasal steroid - nurse will do script for generic fluticasone  -  #Acid Reflux  - continue  protonix  - contniue (REALLY IMPORANT IS DIET) diet sheet from [place] [facility] - avoid colas, spices, cheeses, spirits, red meats, beer, chocolates, fried foods etc.,  - sleep with head end of bed elevated  - eat small frequent meals  - do not go to bed for 3 hours after last meal  - cotninue to avoid fish oil and flaxseed  #Asthma  - since diagnosis of asthma was in doubt. Please stop Advair and just use albuterol as needed  -We'll do spirometry at followup  # allergies  - Please see Dr. Baird Lyons in our office for allergy evaluation; will do referral  #Vocal Cord Dysfunction  - congratulations on graduating from program with speech therapy [name]  #BP  - Continue losartan  #Followup  - I will see you in 8 weeks.  - -Important you comply with advice 100% correctly  - Glad you are much better  - Cough score worksheet at followup   OV 08/25/2011 Followup for chronic cough. She is off advair as advised. She is doing gerd control.  Ojectively her cough score is not reduced. Last visit score was 13 but now it is 14.  (Level III postnasal drip, and post nasal drip. . Level 2  hoarseness of voice and choking episodes. Level 1 - cough after lying down,and  sensation of lump in throat and heartburn). Howevefr, subjectively cough much improved. Still with post nasal drip. Has seen Dr Annamaria Boots for allergy 08/06/11 and 08/19/11. RAST serum profile negative except for elevated [place] IGE. Allergy skin test pending 09/16/11. On visit 08/19/11 to Dr Annamaria Boots had atypical chest pain - releived with ppi in office. EKG okay. Trop was normal. . Of note, Dr Annamaria Boots is also addressing her prior OSA issues.   ROS c/o anxiety, insomnia, also chest pains  - atypical  Spirometry  - fev1 1.74L/72%. Ratio 78    #Chest pains  - please see [facility] asap - > equivocal stress test and but clinically low probability February 2013; placed in clinical followup #Cough  - for sinus: follow Dr Annamaria Boots recommendation  - for acid reflux: follow medications and diet  - for possible asthma: stay off advair and once cardiology ok, please have methacholine challenge test  #Followup  - complete cardiology visit and methacholine challenge  - 4-6 weeks from now  09/16/11 with Dr Annamaria Boots Allergist- 48 yoF former smoker referred by Dr Chase Caller who has been seeing her for multifactorial cough, questioning an allergic component At last visit she had presented with chest pain, preventing planned allergy skin testing. Now following with cardiology and pending CT scan of her heart/Dr. Burt Knack. Still complains of nasal congestion. Able to use her CPAP more regularly when her nose is not stopped up. Unable to do methacholine challenge last month because her baseline is from a tree scores were too low. CPAP autotitration done/[facility]. Pending download. PFT- 09/11/11- FVC 2.15/68%, FEV1 1.59/ 64%, FEV1/FVC 0.80. FEF 25-75 % 0.37/ 15%   OV 03/21/2013 Followup chronic cough  - I have not seen her in nearly 15 months. She says that basically her cough resolved but  cough is no longer chronic and only occurs intermittently when she has "bronchitis". In the last year she's had antibiotics and prednisone for acute episodes of coughing at least 3 times. In between episodes she is asymptomatic. Currently she feels she's having one such episode in the past 3 weeks with chest congestion, cough with white mucus, fatigue and associated wheeze. Currently RSI  cough score is 21 and show severe cough.  - Of note, she has not had methacholine challenge test that I advised a year and half ago. She says that she does not have vocal cord dysfunction based on Dr. Mickie Hillier exam in August 2012 ENT physician   REC levaquin and pred burst  04/20/2013 Follow up and Med review  new med calendar - pt brought all meds with her today.  does report some sinus pressure, congestion, SOB, wheezing, chest tightness. Is feeling better but still gets into coughing fits that takes her a while to calm down. Strong odors seem to causes attacks along with laughing  She did have an attack in the office. Sats were normal however she has significant upper airway psuedowheezing w/ barking cough.  No fever , chest pain, over reflux , orthopnea or edema.  No using CPAP lately   REC top Advair . Add Chlor trimeton '4mg'$  2 At bedtime   Change Loratadine '10mg'$  in am.  Add Pepcid '20mg'$  At bedtime   Avoid throat clearing , use sips of water.  Saline nasal rinses Twice daily  .  NO MINT PRODUCTS  GERD diet .  We are setting you up for a CT neck .  Restart CPAP At bedtime   follow up Dr. Chase Caller in 4 weeks.  Please contact office for sooner follow up if symptoms do not improve or worsen or seek emergency care    OV  05/26/2013 Chief Complaint  Patient presents with  . Follow-up    Pt c/o chest tightness, dry cough, wheezing and sob. Pt states this was some better while she was on prednisone. Last dose x 3 weeks ago.   Followup for chronic cough. She says her last 2-3 weeks that she's having  an acute bronchitis flare with yellow phlegm. She feels she needs antibiotics and prednisone. In fact in the time that she last saw me in September 2014 she's had another round of antibiotics and prednisone with her primary care physician Dr. Deforest Hoyles. She feels her chest is congested although in the office she is clearly having a barking cough, laryngeal spasm and upper airway noise intermittently with changes in voice. It is fairly significant. I reminded her that this is consistent with vocal cord dysfunction but she said that her ear nose throat surgeon cleared her for the same. Of note, she had a CT scan of the neck and this was normal.  07/26/2013 Follow up  2 month follow up - reports some  congestion, head congestion w/ light yellow mucus that is on/off. Has sinus drainage daily . Is not using claritin or chlortrimeton as recommended.  Was taken off ACE in past , now on Cozaar.   RSI cough score unchanged at 21  Had Esophagram on 1/8 w/ mild GERD/HH. No sign delay emptying .  No chest pain, orthopnea, edema , fever.  +hoarseness on/off.   She says she feels so much better with CPAP , using during daytime for naps and At bedtime  . Is amazed at how much she feels better.    OV 03/20/2014  Chief Complaint  Patient presents with  . Follow-up    Pt states she is unable to hold her voice as long when she sings. Pt c/o PND and prod cough, pt thinks its d/t allergies. Pt c/o chest soreness on right upper chest.    Followup chronic cough  - I have not seen the patient in nearly 10 months. Overall cough was  stable but recently she has had a flareup. I'm not so sure that she is followed instructions compliantly. It took me a while to understand the medication regimen but it appears that she takes dymista for  Sinuses but saline wash as needed, and not taking claritin, Advair as needed for her lungs, Protonix schedule followup acid reflux but Pepcid only as needed. She is only somewhat compliant with  the Neurontin. She continues to have sinus drainage, clearing of the throat and the laryngeal/barking cough that she feels is coming from the throat. This is associated with dyspnea and is of moderate severity, and some wheezing but she feels wheeze is upper airway  REC #Chronic cough  - I thnk cough is related to voice box issues and loss of neural control of the same - Please take prednisone 40 mg x1 day, then 30 mg x1 day, then 20 mg x1 day, then 10 mg x1 day, and then 5 mg x1 day and stop - refer Dr Ernestine Conrad of [facility] for chronic cough - for now continue current nasal, acid reflux and neurontin therapies - continue albuterol as needed ; for now hold off scheduled advair - repeat HRCT chest wo contrast for chronic cough  #FOllowup  3 months - after you see Dr Joya Gaskins of [facility]  Come sooner if there are problems o  OV 10/22/2014  Chief Complaint  Patient presents with  . Follow-up    Pt stated her cough has improved. Pt c/o hoarseness and chest tightness. Pt is wearing CPAP nightly and denies any issues with machine, mask or pressure.     FU chronic cough  - IN interim in Sept 2015 had CT chest : CT chest 03/26/14 shows worsening infiltreats in bases compaed to nov 2014. COncern for aspiration and referred  To GI. She does not know details of GI Visit but review of records shows she has gastroparesis and hs been started on reglan. Also she saw Dr Joya Gaskins of ENT in [place]. WIth above measure cough has resolved. /nearly resolved. No respiratory issues.   reports that she quit smoking about 18 years ago. Her smoking use included Cigarettes. She has a 25 pack-year smoking history. She has never used smokeless tobacco.   OV 11/12/2014  Chief Complaint  Patient presents with  . Follow-up    Pt here after PFT. Pt stated her breathing has slightly improved,she is less SOB. Pt stated her cough has also imrpoved. Pt has not yet had autoimmune panel.    Follow-up  chronic cough with interstitial lung disease findings  As of last visit her cough resolved. Cough was related to aspiration. As soon as she started following gastroenterology advice cough resolved. However follow-up CT scan of the chest in April 2016 compared to fall 2015 showed persistent ILD findings as described below [personally reviewed image]. At this point she is asymptomatic. I had her do pulmonary function test and autoimmune workup and reports for follow-up. Of these she only completed pulmonary function test that shows restriction with slight reduction in diffusion capacity [some of the restriction can be due to obesity]. She continues to be asymptomatic. She's not having reflux symptoms anymore. Of note, she forgot to have a autoimmune test. She does admit that her daughter has lupus   IMPRESSION: CT chest 10/29/2014 personally reviewed image  1. Stable findings of peribronchovascular ground-glass attenuation, predominantly in the basal portions of the lungs bilaterally, again favored to reflect nonspecific interstitial pneumonia (NSIP). 2. Mild air  trapping, indicative of mild small airways disease.   Electronically Signed  By: Vinnie Langton M.D.  On: 10/29/2014 16:19   Pulmonary function test 10/31/2014 shows restriction with reduced diffusion capacity. FVC 2.1 L/69%, FEV1 1.8 L/75% and ratio of 86. No broncho-dilator response. Total lung capacity 3.5 L/63% and DLCO 20.0/70%   OV 06/10/2015  Chief Complaint  Patient presents with  . Follow-up    Pt states she has been getting more frequent sinus infections, slight increase in SOB, chest heaviness. Pt here after HRCT. Pt wearing CPAP nightly.    Follow-up multifactorial chronic cough Follow-up mild basal interstitial lung disease not otherwise specified  She currently reports that she is not having much cough although after I left the office I heard a cough and periodically she did have variable upper airway noise.  She continues to have gastroparesis related acid reflux particularly when she eats food she regurgitates. She last saw GI March 2016 and was asked to increase and metoclopramide. She's not having much dyspnea. Overall she feels okay. She is having some atypical chest pain going on for a few months in the right supramammary area    11/12/2014: Autoimmune antibody panel negative. Hypersensitivity pneumonitis panel negative  Notes Recorded by Brand Males, MD on 06/04/2015 at 11:32 AM IMPRESSION: Basilar predominant subpleural peribronchovascular nodularity and ground-glass, unchanged from 05/30/2013, possibly due to nonspecific interstitial pneumonitis.   Electronically Signed By: Lorin Picket M.D. On: 06/03/2015 15:03  Review of Systems     Objective:   Physical Exam  Constitutional: She is oriented to person, place, and time. She appears well-developed and well-nourished. No distress.  Obese   HENT:  Head: Normocephalic and atraumatic.  Right Ear: External ear normal.  Left Ear: External ear normal.  Mouth/Throat: Oropharynx is clear and moist. No oropharyngeal exudate.  Occasional upper airway noise Occasional cough  Eyes: Conjunctivae and EOM are normal. Pupils are equal, round, and reactive to light. Right eye exhibits no discharge. Left eye exhibits no discharge. No scleral icterus.  Neck: Normal range of motion. Neck supple. No JVD present. No tracheal deviation present. No thyromegaly present.  Cardiovascular: Normal rate, regular rhythm, normal heart sounds and intact distal pulses.  Exam reveals no gallop and no friction rub.   No murmur heard. Pulmonary/Chest: Effort normal. No respiratory distress. She has no wheezes. She has rales. She exhibits no tenderness.  Faint basal crackles  Abdominal: Soft. Bowel sounds are normal. She exhibits no distension and no mass. There is no tenderness. There is no rebound and no guarding.  Musculoskeletal: Normal range of  motion. She exhibits no edema or tenderness.  Lymphadenopathy:    She has no cervical adenopathy.  Neurological: She is alert and oriented to person, place, and time. She has normal reflexes. No cranial nerve deficit. She exhibits normal muscle tone. Coordination normal.  Skin: Skin is warm and dry. No rash noted. She is not diaphoretic. No erythema. No pallor.  Psychiatric: She has a normal mood and affect. Her behavior is normal. Judgment and thought content normal.  Vitals reviewed.  Filed Vitals:   06/10/15 1039  BP: 126/60  Pulse: 67  Height: '5\' 7"'$  (1.702 m)  Weight: 222 lb 9.6 oz (100.971 kg)  SpO2: 98%          Assessment:       ICD-9-CM ICD-10-CM   1. ILD (interstitial lung disease) ([place]) 515 J84.9         Plan:      You have very  mild pulmonary fibrosis in the bottom part of your lungs on both sides There are 100 varieties of this pulmonary fibrosis otherwise called interstitial lung disease Based on blood work and CT scan of the chest I do not know which variety you have In an ideal world you need a surgical lung biopsy to sort this out However , Most likely this is related to your gastroparesis and acid reflux Nevertheless it is stable in the past 2 years since November 2014 through November 2016  Plan - I have written to the physician assistant in gastroenterology to continue to work on controlling her acid reflux  Follow-up - I will see you in one year with HRCT scan of the chest and breathing test - In between if your shortness of breath is getting worse or cough is getting worse please come back sooner and we will make arrangements for surgical lung biopsy  (> 50% of this 15 min visit spent in face to face counseling or/and coordination of care)    Dr. Brand Males, M.D., [facility] [place] [facility].C.P Pulmonary and Critical Care Medicine Staff Physician [facility] Pager: [phone], If no answer or between  15:00h -  7:00h: call [phone]  06/10/2015 11:18 AM

## 2015-06-10 NOTE — Addendum Note (Signed)
Addended by: Nicanor AlconNAGLE, Altha Sweitzer K on: 06/10/2015 11:26 AM   Modules accepted: Orders

## 2015-06-17 ENCOUNTER — Encounter: Payer: Self-pay | Admitting: Physician Assistant

## 2015-06-17 ENCOUNTER — Ambulatory Visit (INDEPENDENT_AMBULATORY_CARE_PROVIDER_SITE_OTHER): Payer: PPO | Admitting: Physician Assistant

## 2015-06-17 VITALS — BP 92/60 | HR 68 | Ht 67.0 in | Wt 224.0 lb

## 2015-06-17 DIAGNOSIS — K5909 Other constipation: Secondary | ICD-10-CM

## 2015-06-17 DIAGNOSIS — K3184 Gastroparesis: Secondary | ICD-10-CM | POA: Diagnosis not present

## 2015-06-17 DIAGNOSIS — K449 Diaphragmatic hernia without obstruction or gangrene: Secondary | ICD-10-CM | POA: Diagnosis not present

## 2015-06-17 DIAGNOSIS — R1319 Other dysphagia: Secondary | ICD-10-CM

## 2015-06-17 MED ORDER — PANTOPRAZOLE SODIUM 40 MG PO TBEC: 40.0000 mg | DELAYED_RELEASE_TABLET | Freq: Two times a day (BID) | ORAL | Status: DC

## 2015-06-17 MED ORDER — METRONIDAZOLE 250 MG PO TABS: 250.0000 mg | ORAL_TABLET | Freq: Three times a day (TID) | ORAL | Status: DC

## 2015-06-17 MED ORDER — METOCLOPRAMIDE HCL 5 MG/5ML PO SOLN: 10.0000 mg | Freq: Three times a day (TID) | ORAL | Status: DC

## 2015-06-17 NOTE — Progress Notes (Addendum)
Patient ID: [name], female   DOB: [birth year], 59 y.o.   MRN: [id number]     History of Present Illness: [name] is a pleasant 59 year old female who is known to Dr. Rhea Belton. She has a history of COPD, sleep apnea, diabetes, hypertension, aspiration pneumonia, GERD, hiatal hernia, gastroparesis, and constipation. She had an upper endoscopy in December 2015 that revealed a small hiatal hernia, mild gastritis, but was otherwise unremarkable. Biopsies showed chronic mild inactive gastritis with no H. pylori. She had a gastric emptying scan in February 2016 that confirmed gastroparesis. She has been on Reglan syrup 5 mg before meals and at bedtime, as well as pantoprazole 40 mg by mouth every morning. She was recently evaluated by pulmonary. She was felt to have LPR symptoms of hoarseness, frequent throat clearing, excess throat mucus, coughing when supine, and postnasal drip. She reports that despite using pantoprazole she occasionally gets breakthrough heartburn later in the day. She frequently coughs. She does not describe difficulty swallowing solids or liquids. She does report however that more frequently she feels as if foods and liquids "want to go down the wrong pipe" she often coughs and sputters while eating. She does have a known history of vocal cord dysfunction.  She also has a history of constipation and has been on linzess. With this she says she is having one or 2 bowel movements daily. She complains of excess gas and bloating.   Past Medical History  Diagnosis Date  . HTN (hypertension)   . Obesity   . Asthma   . Sciatica   . Rotator cuff tear   . Anxiety   . COPD (chronic obstructive pulmonary disease) (HCC)   . Sleep apnea     CPAP, sleep study on [place]  . Headache(784.0)   . Environmental allergies     "year round"  . High cholesterol   . Cold extremity without peripheral vascular disease     bilaterally; "from my knees down into my feet; they get ice cold"  .  Heart murmur   . Chronic bronchitis (HCC)   . Vocal cord dysfunction     "I've had it for many years"  . Exertional dyspnea   . Type II diabetes mellitus (HCC)   . GERD (gastroesophageal reflux disease)   . Arthritis     "both shoulders"  . Sciatic nerve pain     "tailbone down into my legs when it flares up"  . Blood transfusion without reported diagnosis   . Hiatal hernia     Past Surgical History  Procedure Laterality Date  . Nasal sinus surgery  1992  . Toe surgery      for ingrown [id number] ?left  . Shoulder arthroscopy w/ rotator cuff repair  01/28/12    left  . Appendectomy  1976  . Oophorectomy  1976    w/ovarian cyst excision and appy  . Total abdominal hysterectomy  1985  . Abdominal adhesion surgery  ~ 1978   Family History  Problem Relation Age of Onset  . Heart disease Mother   . Asthma Mother   . Heart failure Maternal Grandmother   . Asthma Son   . Asthma Daughter   . Lupus Daughter   . Colon cancer Neg Hx   . Throat cancer Neg Hx   . Stomach cancer Neg Hx   . Esophageal cancer Neg Hx   . Rectal cancer Neg Hx   . Diabetes      "everybody on both sides"  Social History  Substance Use Topics  . Smoking status: Former Smoker -- 1.00 packs/day for 25 years    Types: Cigarettes    Quit date: 07/13/1996  . Smokeless tobacco: Never Used  . Alcohol Use: No     Comment: 01/29/12 "used to abuse alcohol; last drink was in the 1980's"   Current Outpatient Prescriptions  Medication Sig Dispense Refill  . albuterol (PROVENTIL HFA;VENTOLIN HFA) 108 (90 BASE) MCG/ACT inhaler Inhale 2 puffs into the lungs every 4 (four) hours as needed for wheezing or shortness of breath.     . ALPRAZolam (XANAX) 0.5 MG tablet Take 0.5 mg by mouth 2 (two) times daily as needed.     Marland Kitchen. aspirin 81 MG tablet Take 81 mg by mouth 2 (two) times daily.     Marland Kitchen. atorvastatin (LIPITOR) 20 MG tablet Take 20 mg by mouth every evening.     . Azelastine-Fluticasone (DYMISTA) 137-50 MCG/ACT  SUSP Place into the nose daily.    . chlorpheniramine (CHLOR-TRIMETON) 4 MG tablet 2 tabs by mouth at bedtime    . citalopram (CELEXA) 10 MG tablet Take 1 tablet by mouth daily.    . cyclobenzaprine (FLEXERIL) 10 MG tablet Take 1 tablet by mouth as needed.    . gabapentin (NEURONTIN) 300 MG capsule Take 1 capsule by mouth 3 (three) times daily.     Marland Kitchen. HUMULIN N KWIKPEN 100 UNIT/ML Kiwkpen Inject 30 Units into the skin 2 (two) times daily.    . Linaclotide (LINZESS) 290 MCG CAPS capsule Take 1 capsule (290 mcg total) by mouth daily. 30 capsule 2  . loratadine (CLARITIN) 10 MG tablet Take 10 mg by mouth every morning.    Marland Kitchen. losartan (COZAAR) 100 MG tablet Take 1 tablet by mouth daily.    . metFORMIN (GLUCOPHAGE) 1000 MG tablet Take 1 tablet by mouth 2 (two) times daily.    . metoCLOPramide (REGLAN) 5 MG/5ML solution Take 5 mLs (5 mg total) by mouth 4 (four) times daily -  before meals and at bedtime. 600 mL 1  . pantoprazole (PROTONIX) 40 MG tablet Take 1 tablet (40 mg total) by mouth 2 (two) times daily. 60 tablet 3  . traMADol (ULTRAM) 50 MG tablet Take 50 mg by mouth every 6 (six) hours as needed for pain.     Marland Kitchen. VICTOZA 18 MG/3ML SOPN Inject 18 mg into the skin every morning.     . metoCLOPramide (REGLAN) 5 MG/5ML solution Take 10 mLs (10 mg total) by mouth 4 (four) times daily -  before meals and at bedtime. 1200 mL 1  . metroNIDAZOLE (FLAGYL) 250 MG tablet Take 1 tablet (250 mg total) by mouth 3 (three) times daily. 30 tablet 0   No current facility-administered medications for this visit.   Allergies  Allergen Reactions  . Lisinopril Cough     Review of Systems: Gen: Denies any fever, chills, sweats, anorexia, fatigue, weakness, malaise, weight loss, and sleep disorder CV: Denies chest pain, angina, palpitations, syncope, orthopnea, PND, peripheral edema, and claudication. Resp: Admits to chronic cough GI: Denies vomiting blood, jaundice, and fecal incontinence.   Denies dysphagia or  odynophagia. GU : Denies urinary burning, blood in urine, urinary frequency, urinary hesitancy, nocturnal urination, and urinary incontinence. MS: Denies joint pain, limitation of movement, and swelling, stiffness, low back pain, extremity pain. Denies muscle weakness, cramps, atrophy.  Derm: Denies rash, itching, dry skin, hives, moles, warts, or unhealing ulcers.  Psych: Denies depression, anxiety, memory loss, suicidal ideation, hallucinations, paranoia,  and confusion. Heme: Denies bruising, bleeding, and enlarged lymph nodes. Neuro:  Denies any headaches, dizziness, paresthesia Endo:  Denies any problems with DM, thyroid, adrenal     Studies:   Ct Chest High Resolution  06/03/2015  CLINICAL DATA:  Worsening shortness of breath when lying flat and with exertion. Evaluate for interstitial lung disease. EXAM: CT CHEST WITHOUT CONTRAST TECHNIQUE: Multidetector CT imaging of the chest was performed following the standard protocol without intravenous contrast. High resolution imaging of the lungs, as well as inspiratory and expiratory imaging, was performed. COMPARISON:  10/26/2014, 03/26/2014 and 05/30/2013. FINDINGS: Mediastinum/Lymph Nodes: No pathologically enlarged mediastinal or axillary lymph nodes. Hilar regions are difficult to definitively evaluate without IV contrast. Heart size normal. No pericardial effusion. Lungs/Pleura: Very minimal basilar dependent subpleural peribronchovascular nodularity and ground-glass, unchanged from 05/30/2013. No definitive subpleural reticulation, traction bronchiectasis/ bronchiolectasis, architectural distortion or honeycombing. No air trapping. No pleural fluid. Airway is unremarkable. Upper abdomen: Visualized portions of the liver, gallbladder, adrenal glands, kidneys, spleen, pancreas, stomach and bowel are grossly unremarkable. No upper abdominal adenopathy. Musculoskeletal: No worrisome lytic or sclerotic lesions. IMPRESSION: Basilar predominant  subpleural peribronchovascular nodularity and ground-glass, unchanged from 05/30/2013, possibly due to nonspecific interstitial pneumonitis. Electronically Signed   By: Leanna Battles M.D.   On: 06/03/2015 15:03     Physical Exam: BP 92/60 mmHg  Pulse 68  Ht  (1.702 m)  Wt 224 lb (101.606 kg)  BMI 35.08 kg/m2 General: Pleasant, well developed , African-American female in no acute distress Head: Normocephalic and atraumatic Eyes:  sclerae anicteric, conjunctiva pink  Ears: Normal auditory acuity Lungs: Clear throughout to auscultation Heart: Regular rate and rhythm Abdomen: Soft, non distended, non-tender. No masses, no hepatomegaly. Normal bowel sounds Musculoskeletal: Symmetrical with no gross deformities  Extremities: No edema  Neurological: Alert oriented x 4, grossly nonfocal Psychological:  Alert and cooperative. Normal mood and affect  Assessment and Recommendations: 58 year old female with a history of GERD, hiatal hernia, LPR, and gastroparesis, here for follow-up. She has been continuing to have difficulty with her respiratory disease, this may be exacerbated by her GERD and gastroparesis. An antireflux regimen has been reviewed at length. She will increase her pantoprazole to 40 mg twice a day, 30 minutes before breakfast and 30 minutes before supper. She will also increase her Reglan syrup 5 mg per 5 ML's to 10 mL's before meals and at bedtime. She will be scheduled for a modified barium swallow with speech pathology to evaluate whether or not there may be a component of transfer dysphagia. She will follow up in 4-6 weeks, sooner if needed.  For her gas and bloating, she will be given an empiric trial of Flagyl 250 mg 1 by mouth 3 times daily for 10 days for possible small intestinal bacterial overgrowth. She has been advised to avoid alcoholic beverages while on the Flagyl and for 3 days after completion.   Jabreel Chimento, Tollie Pizza PA-C 06/17/2015,  Addendum: Reviewed and  agree with management. Beverley Fiedler, MD

## 2015-06-17 NOTE — Patient Instructions (Signed)
You have been scheduled for a modified barium swallow on 06-21-2015 at 11am. Please arrive 15 minutes prior to your test for registration. [facility] for your appointment. Please refrain from eating or drinking anything 4 hours prior to your test. Should you need to cancel or reschedule your appointment, please contact [phone] [facility]([place]) or [phone] [facility]([place]). _____________________________________________________________________ A Modified Barium Swallow Study, or MBS, is a special x-ray that is taken to check swallowing skills. It is carried out by a Marine scientistadiologist and a Warehouse managerpeech Language Pathologist (SLP). During this test, yourmouth, throat, and esophagus, a muscular tube which connects your mouth to your stomach, is checked. The test will help you, your doctor, and the SLP plan what types of foods and liquids are easier for you to swallow. The SLP will also identify positions and ways to help you swallow more easily and safely. What will happen during an MBS? You will be taken to an x-ray room and seated comfortably. You will be asked to swallow small amounts of food and liquid mixed with barium. Barium is a liquid or paste that allows images of your mouth, throat and esophagus to be seen on x-ray. The x-ray captures moving images of the food you are swallowing as it travels from your mouth through your throat and into your esophagus. This test helps identify whether food or liquid is entering your lungs (aspiration). The test also shows which part of your mouth or throat lacks strength or coordination to move the food or liquid in the right direction. This test typically takes 30 minutes to 1 hour to complete. _______________________________________________________________________  We have sent the following medications to your pharmacy for you to pick up at your convenience.  Please call the office at [phone] to schedule a follow up in 4-6 weeks  with Dr Rhea BeltonPyrtle or Lawson FiscalLori Hvozdovic PA

## 2015-06-18 ENCOUNTER — Other Ambulatory Visit (HOSPITAL_COMMUNITY): Payer: Self-pay | Admitting: Physician Assistant

## 2015-06-18 DIAGNOSIS — R1310 Dysphagia, unspecified: Secondary | ICD-10-CM

## 2015-06-21 ENCOUNTER — Ambulatory Visit (HOSPITAL_COMMUNITY)
Admission: RE | Admit: 2015-06-21 | Discharge: 2015-06-21 | Disposition: A | Discharge status: Home / Self Care | Payer: PPO | Source: Ambulatory Visit | Attending: Physician Assistant | Admitting: Physician Assistant

## 2015-06-21 DIAGNOSIS — J383 Other diseases of vocal cords: Secondary | ICD-10-CM | POA: Diagnosis not present

## 2015-06-21 DIAGNOSIS — K449 Diaphragmatic hernia without obstruction or gangrene: Secondary | ICD-10-CM | POA: Insufficient documentation

## 2015-06-21 DIAGNOSIS — I10 Essential (primary) hypertension: Secondary | ICD-10-CM | POA: Insufficient documentation

## 2015-06-21 DIAGNOSIS — R1310 Dysphagia, unspecified: Secondary | ICD-10-CM | POA: Insufficient documentation

## 2015-06-21 DIAGNOSIS — R05 Cough: Secondary | ICD-10-CM | POA: Diagnosis not present

## 2015-06-21 DIAGNOSIS — E119 Type 2 diabetes mellitus without complications: Secondary | ICD-10-CM | POA: Diagnosis not present

## 2015-06-21 DIAGNOSIS — E7800 Pure hypercholesterolemia, unspecified: Secondary | ICD-10-CM | POA: Diagnosis not present

## 2015-06-21 DIAGNOSIS — K219 Gastro-esophageal reflux disease without esophagitis: Secondary | ICD-10-CM | POA: Insufficient documentation

## 2015-06-21 DIAGNOSIS — R1319 Other dysphagia: Secondary | ICD-10-CM

## 2015-06-21 DIAGNOSIS — K3184 Gastroparesis: Secondary | ICD-10-CM

## 2015-07-16 ENCOUNTER — Ambulatory Visit (INDEPENDENT_AMBULATORY_CARE_PROVIDER_SITE_OTHER): Payer: PPO | Admitting: Internal Medicine

## 2015-07-16 ENCOUNTER — Encounter: Payer: Self-pay | Admitting: Internal Medicine

## 2015-07-16 VITALS — BP 118/68 | HR 62 | Ht 67.0 in | Wt 241.0 lb

## 2015-07-16 DIAGNOSIS — G4733 Obstructive sleep apnea (adult) (pediatric): Secondary | ICD-10-CM

## 2015-07-16 DIAGNOSIS — R05 Cough: Secondary | ICD-10-CM | POA: Diagnosis not present

## 2015-07-16 DIAGNOSIS — R053 Chronic cough: Secondary | ICD-10-CM

## 2015-07-16 MED ORDER — BELSOMRA 15 MG PO TABS: 1.0000 | ORAL_TABLET | Freq: Every day | ORAL | Status: DC

## 2015-07-16 NOTE — Assessment & Plan Note (Signed)
Little recent cough or wheeze. Feels that she is breathing better. She will report changes to Dr. Marchelle Gearingamaswamy on follow-up.

## 2015-07-16 NOTE — Progress Notes (Signed)
Subjective:     Patient ID: [name], female   DOB: 1955/11/25, 60 y.o.   MRN: [id number]  HPI  OV 02/17/2011. 60 year old female. Obese. AA female. Remote smoker On cpap for OSA (pmd managing). REferred by Dr Delfina Redwood.    New visit for chronic cough. Preesent for many years. Insidious onset.  Progressively with more frequency past few years. Cough rated as moderate. Usually dry cough with associated barking quality. Occ mucus present; thick and clear. Cough worsened by milk products, change in weather and dust. Hx of prior ENT eval in [place] > 15 years ago: diagnosed as "vocal cords sticking together" and remote hx of sinus surgery Also diagnosed with GERD in [place] > 15 years ago; takes protonix.  [facility] Reflux Symptom INdex score is 39 and very c/w LPR cough(severe hoarsness of voice, clearing of throat, ecess throat mucus, post nasal drip, coughing after lying down or eating, choking epidoses, sensation of globus with food sticking in thorat) and associated heart burn. Cough so severe that she could not complete spirometry in office today  Has dyspnea too: few years, insidious onset. Progressive. Rates it as moderate. Dyspnea brought on by associated chest tightness and nasal drainage or even exertion like climbing flight of steps. Dyspnea relieved by rest. Cough and dyspnea associated with wheezing and constant yellow sinus drainage as well. Frequent nocturnal awakenings with choking present. Above resulted in asthma diagnosis versus tracheobronchiolotis > 15 years ago: on advair since then which she is unsure is helping. She herself is not convinced she has asthma.   Associated med intake shows tramadol (for shoulder pain and sicatica), fish oil/ flaxseed (2 years) and ACE inhibitor intake (been on lisniopril for > 10 years due to diabetes)   Pas med hx review fromn outside record notes "hx of asthma and vocal cord dysfunction and post nasal drip. Recalls frequent ER visits atleast one per  month (last er visit 2 months ago per hx) to [place]. Denies ICU admits, intubations for same.   REC Cough is from ACE Inhibitor, sinus drainage, vocal cord dysfunction, acid reflux and possible cough all conspiring to cause cyclical cough/LPR cough #Sinus drainage  - continue nasal steroid - refer to [facility] Dr. Wilburn Cornelia or Dr Redmond Baseman #Possible Acid Reflux  - continue protonix   - take diet sheet from [place] - avoid colas, spices, cheeses, spirits, red meats, beer, chocolates, fried foods etc.,   - sleep with head end of bed elevated  - eat small frequent meals  - do not go to bed for 3 hours after last meal - stop fish oil and flaxseed for now #Asthma   - continue advair for now - might have to consider changing over to something else later depending on ENT evaluation #Vocal Cord Dysfunction - see ENT doctor first  -later  will consider referral to speech therapy with [name] #BP  - stop lisinopril - take benicar 1 tablet daily - take sample, nurse will add it in med list #Followup - I will see you in 4-6 weeks.  - Reattempt spirometry with [name] at time of followup -Important you comply with advice 100% correctly   OV 03/31/11: Followup cough. SAw ENT 89/17./12: no anatomic path or VCD noticed at time of exam though patient noted to have choked.  She states that she was not happy with her experience at ENT visit and does not want to visit the same office again. She is compliant with instructions  except for diet though she has made improvements there. Off lisniopril. Follows sinus, gerd and astham advice. Does not like advair diskus. Overall cough is much better. She is happy with improvement. RSI cough score dropped from 39 to 14.  She sses her hoarsenss, clearing and pos nasal drip as mild-moderate  PFTS show no more flow volume loop issues. There is restriction with mixed obstruction. DLCO 67%   REC Cough is from sinus drainage, vocal cord dysfunction, acid  reflux and possible cough all conspiring to cause cyclical cough/LPR cough  #Sinus drainage  - continue nasal steroid - nurse will do script for generic fluticasone -  #Acid Reflux  - continue protonix   - take diet sheet from [place] [facility] - avoid colas, spices, cheeses, spirits, red meats, beer, chocolates, fried foods etc.,   - sleep with head end of bed elevated  - eat small frequent meals  - do not go to bed for 3 hours after last meal - cotninue to avoid fish oil and flaxseed   #Asthma   - continue advair but nurse will give you sample of HFA and teach you how to take it with spacer; she will do script as well  #Vocal Cord Dysfunction - referral to speech therapy with [name]  #BP  - will add lisinopril to allergy list - take benicar 1 tablet daily or the equivalent Dr Delfina Redwood gives you  #Followup - I will see you in 8 weeks.  - -Important you comply with advice 100% correctly - Glad you are much better - Flu shot today please   OV 06/26/2011 Followup for chronic cough. She continues to do better. Subjectively feels much better compared to last visit. Although objectively her cough score has only reduced by 1 point. Today, RSI cough score is 13. Level III postnasal drip. Level to hoarseness of voice, cough after lying down, choking episodes, and annoying cough. Level I sensation of lump in throat and heartburn.  In talking to her she is compliant with her nasal steroid and her Protonix for acid reflux but not compliant with diet for acid reflux. She is on Advair HFA with spacer for presumed asthma and she feels this makes her cough less compared to Advair discus. We discussed more about her asthma history and she says that she was never sure she had asthma diagnosis in [place] it was a presumptive diagnosis. She is eager to come off Advair and give a trial without it. She also gives a history of allergies and allergy shots while living in [place] and she is open to an  allergy evaluation right now. In terms of cyclical cough she has finished speech therapy evaluation and graduated from the program. Overall she feels quality-of-life is acceptable although she would like to see cough improve further. So far we have not tried Neurontin    Past, Family, Social reviewed: no change since last visit except that Dr Deforest Hoyles is new PMD. She now states she has year round allergies. Recollects while in [place] used to take allergy shots twice a week 10 years ago. Since moving to [place] 4 years ago not seen an allergist. Laverle Hobby to see allergist. She feels she does not have asthma.  Cough is from sinus drainage, vocal cord dysfunction, acid reflux and possible asthma or allergiesh all working together to cause cyclical cough/LPR cough  #Sinus drainage  - continue nasal steroid - nurse will do script for generic fluticasone  -  #Acid Reflux  - continue  protonix  - contniue (REALLY IMPORANT IS DIET) diet sheet from [place] [employer] - avoid colas, spices, cheeses, spirits, red meats, beer, chocolates, fried foods etc.,  - sleep with head end of bed elevated  - eat small frequent meals  - do not go to bed for 3 hours after last meal  - cotninue to avoid fish oil and flaxseed  #Asthma  - since diagnosis of asthma was in doubt. Please stop Advair and just use albuterol as needed  -We'll do spirometry at followup  # allergies  - Please see Dr. Baird Lyons in our office for allergy evaluation; will do referral  #Vocal Cord Dysfunction  - congratulations on graduating from program with speech therapy [name]  #BP  - Continue losartan  #Followup  - I will see you in 8 weeks.  - -Important you comply with advice 100% correctly  - Glad you are much better  - Cough score worksheet at followup   OV 08/25/2011 Followup for chronic cough. She is off advair as advised. She is doing gerd control.  Ojectively her cough score is not reduced. Last visit score was 13 but now it is 14.  (Level III postnasal drip, and post nasal drip. . Level 2  hoarseness of voice and choking episodes. Level 1 - cough after lying down,and  sensation of lump in throat and heartburn). Howevefr, subjectively cough much improved. Still with post nasal drip. Has seen Dr Annamaria Boots for allergy 08/06/11 and 08/19/11. RAST serum profile negative except for elevated [place] IGE. Allergy skin test pending 09/16/11. On visit 08/19/11 to Dr Annamaria Boots had atypical chest pain - releived with ppi in office. EKG okay. Trop was normal. . Of note, Dr Annamaria Boots is also addressing her prior OSA issues.   ROS c/o anxiety, insomnia, also chest pains  - atypical  Spirometry  - fev1 1.74L/72%. Ratio 78    #Chest pains  - please see [facility] asap - > equivocal stress test and but clinically low probability February 2013; placed in clinical followup #Cough  - for sinus: follow Dr Annamaria Boots recommendation  - for acid reflux: follow medications and diet  - for possible asthma: stay off advair and once cardiology ok, please have methacholine challenge test  #Followup  - complete cardiology visit and methacholine challenge  - 4-6 weeks from now  09/16/11 with Dr Annamaria Boots Allergist- 57 yoF former smoker referred by Dr Chase Caller who has been seeing her for multifactorial cough, questioning an allergic component At last visit she had presented with chest pain, preventing planned allergy skin testing. Now following with cardiology and pending CT scan of her heart/Dr. Burt Knack. Still complains of nasal congestion. Able to use her CPAP more regularly when her nose is not stopped up. Unable to do methacholine challenge last month because her baseline is from a tree scores were too low. CPAP autotitration done/[facility]. Pending download. PFT- 09/11/11- FVC 2.15/68%, FEV1 1.59/ 64%, FEV1/FVC 0.80. FEF 25-75 % 0.37/ 15%   OV 03/21/2013 Followup chronic cough  - I have not seen her in nearly 15 months. She says that basically her cough resolved but  cough is no longer chronic and only occurs intermittently when she has "bronchitis". In the last year she's had antibiotics and prednisone for acute episodes of coughing at least 3 times. In between episodes she is asymptomatic. Currently she feels she's having one such episode in the past 3 weeks with chest congestion, cough with white mucus, fatigue and associated wheeze. Currently RSI  cough score is 21 and show severe cough.  - Of note, she has not had methacholine challenge test that I advised a year and half ago. She says that she does not have vocal cord dysfunction based on Dr. Mickie Hillier exam in August 2012 ENT physician   REC levaquin and pred burst  04/20/2013 Follow up and Med review  new med calendar - pt brought all meds with her today.  does report some sinus pressure, congestion, SOB, wheezing, chest tightness. Is feeling better but still gets into coughing fits that takes her a while to calm down. Strong odors seem to causes attacks along with laughing  She did have an attack in the office. Sats were normal however she has significant upper airway psuedowheezing w/ barking cough.  No fever , chest pain, over reflux , orthopnea or edema.  No using CPAP lately   REC top Advair . Add Chlor trimeton '4mg'$  2 At bedtime   Change Loratadine '10mg'$  in am.  Add Pepcid '20mg'$  At bedtime   Avoid throat clearing , use sips of water.  Saline nasal rinses Twice daily  .  NO MINT PRODUCTS  GERD diet .  We are setting you up for a CT neck .  Restart CPAP At bedtime   follow up Dr. Chase Caller in 4 weeks.  Please contact office for sooner follow up if symptoms do not improve or worsen or seek emergency care    OV  05/26/2013 Chief Complaint  Patient presents with  . Follow-up    Pt c/o chest tightness, dry cough, wheezing and sob. Pt states this was some better while she was on prednisone. Last dose x 3 weeks ago.   Followup for chronic cough. She says her last 2-3 weeks that she's having  an acute bronchitis flare with yellow phlegm. She feels she needs antibiotics and prednisone. In fact in the time that she last saw me in September 2014 she's had another round of antibiotics and prednisone with her primary care physician Dr. Deforest Hoyles. She feels her chest is congested although in the office she is clearly having a barking cough, laryngeal spasm and upper airway noise intermittently with changes in voice. It is fairly significant. I reminded her that this is consistent with vocal cord dysfunction but she said that her ear nose throat surgeon cleared her for the same. Of note, she had a CT scan of the neck and this was normal.  07/26/2013 Follow up  2 month follow up - reports some  congestion, head congestion w/ light yellow mucus that is on/off. Has sinus drainage daily . Is not using claritin or chlortrimeton as recommended.  Was taken off ACE in past , now on Cozaar.   RSI cough score unchanged at 21  Had Esophagram on 1/8 w/ mild GERD/HH. No sign delay emptying .  No chest pain, orthopnea, edema , fever.  +hoarseness on/off.   She says she feels so much better with CPAP , using during daytime for naps and At bedtime  . Is amazed at how much she feels better.    OV 03/20/2014  Chief Complaint  Patient presents with  . Follow-up    Pt states she is unable to hold her voice as long when she sings. Pt c/o PND and prod cough, pt thinks its d/t allergies. Pt c/o chest soreness on right upper chest.    Followup chronic cough  - I have not seen the patient in nearly 10 months. Overall cough was  stable but recently she has had a flareup. I'm not so sure that she is followed instructions compliantly. It took me a while to understand the medication regimen but it appears that she takes dymista for  Sinuses but saline wash as needed, and not taking claritin, Advair as needed for her lungs, Protonix schedule followup acid reflux but Pepcid only as needed. She is only somewhat compliant with  the Neurontin. She continues to have sinus drainage, clearing of the throat and the laryngeal/barking cough that she feels is coming from the throat. This is associated with dyspnea and is of moderate severity, and some wheezing but she feels wheeze is upper airway  REC #Chronic cough  - I thnk cough is related to voice box issues and loss of neural control of the same - Please take prednisone 40 mg x1 day, then 30 mg x1 day, then 20 mg x1 day, then 10 mg x1 day, and then 5 mg x1 day and stop - refer Dr Ernestine Conrad of [facility] for chronic cough - for now continue current nasal, acid reflux and neurontin therapies - continue albuterol as needed ; for now hold off scheduled advair - repeat HRCT chest wo contrast for chronic cough  #FOllowup  3 months - after you see Dr Joya Gaskins of [facility]  Come sooner if there are problems o  OV 10/22/2014  Chief Complaint  Patient presents with  . Follow-up    Pt stated her cough has improved. Pt c/o hoarseness and chest tightness. Pt is wearing CPAP nightly and denies any issues with machine, mask or pressure.     FU chronic cough  - IN interim in Sept 2015 had CT chest : CT chest 03/26/14 shows worsening infiltreats in bases compaed to nov 2014. COncern for aspiration and referred  To GI. She does not know details of GI Visit but review of records shows she has gastroparesis and hs been started on reglan. Also she saw Dr Joya Gaskins of ENT in [place]. WIth above measure cough has resolved. /nearly resolved. No respiratory issues.   reports that she quit smoking about 18 years ago. Her smoking use included Cigarettes. She has a 25 pack-year smoking history. She has never used smokeless tobacco.   OV 11/12/2014  Chief Complaint  Patient presents with  . Follow-up    Pt here after PFT. Pt stated her breathing has slightly improved,she is less SOB. Pt stated her cough has also imrpoved. Pt has not yet had autoimmune panel.    Follow-up  chronic cough with interstitial lung disease findings  As of last visit her cough resolved. Cough was related to aspiration. As soon as she started following gastroenterology advice cough resolved. However follow-up CT scan of the chest in April 2016 compared to fall 2015 showed persistent ILD findings as described below [personally reviewed image]. At this point she is asymptomatic. I had her do pulmonary function test and autoimmune workup and reports for follow-up. Of these she only completed pulmonary function test that shows restriction with slight reduction in diffusion capacity [some of the restriction can be due to obesity]. She continues to be asymptomatic. She's not having reflux symptoms anymore. Of note, she forgot to have a autoimmune test. She does admit that her daughter has lupus   IMPRESSION: CT chest 10/29/2014 personally reviewed image  1. Stable findings of peribronchovascular ground-glass attenuation, predominantly in the basal portions of the lungs bilaterally, again favored to reflect nonspecific interstitial pneumonia (NSIP). 2. Mild air  trapping, indicative of mild small airways disease. Electronically Signed  By: Trudie Reedaniel Entrikin M.D.  On: 10/29/2014 16:19  Pulmonary function test 10/31/2014 shows restriction with reduced diffusion capacity. FVC 2.1 L/69%, FEV1 1.8 L/75% and ratio of 86. No broncho-dilator response. Total lung capacity 3.5 L/63% and DLCO 20.0/70% Sleep Apnea FOLLOWS FOR: pt wearing cpap nightly, c/o mask getting hot sometimes qhs.  has trouble staying asleep.     OV 06/10/2015  Chief Complaint  Patient presents with  . Follow-up    Pt states she has been getting more frequent sinus infections, slight increase in SOB, chest heaviness. Pt here after HRCT. Pt wearing CPAP nightly.    Follow-up multifactorial chronic cough Follow-up mild basal interstitial lung disease not otherwise specified  She currently reports that she is not having much  cough although after I left the office I heard a cough and periodically she did have variable upper airway noise. She continues to have gastroparesis related acid reflux particularly when she eats food she regurgitates. She last saw GI March 2016 and was asked to increase and metoclopramide. She's not having much dyspnea. Overall she feels okay. She is having some atypical chest pain going on for a few months in the right supramammary area   07/16/2015-60 year old female former smoker followed by Dr. Marchelle Gearingamaswamy for mild ILD, recurrent sinus infections, chronic cough, Sleep Apnea FOLLOWS FOR: pt wearing cpap nightly, c/o mask getting hot sometimes qhs.  has trouble staying asleep.   Here for CPAP follow-up. "Loves it" and says she is wearing it all night every night. Fullface mask, [facility]/12.  Not noticing active respiratory problems currently with little cough   ROS-see HPI   Negative unless "+" Constitutional:    weight loss, night sweats, fevers, chills, fatigue, lassitude. HEENT:    headaches, difficulty swallowing, tooth/dental problems, sore throat,       sneezing, itching, ear ache, nasal congestion, post nasal drip, snoring CV:    chest pain, orthopnea, PND, swelling in lower extremities, anasarca,                                           dizziness, palpitations Resp:   shortness of breath with exertion or at rest.                productive cough,   non-productive cough, coughing up of blood.              change in color of mucus.  wheezing.   Skin:    rash or lesions. GI:  No-   heartburn, indigestion, abdominal pain, nausea, vomiting,  GU:  MS:   joint pain, stiffness,  Neuro-     nothing unusual Psych:  change in mood or affect.  depression or anxiety.   memory loss.  OBJ- Physical Exam General- Alert, Oriented, Affect-appropriate, Distress- none acute Skin- rash-none, lesions- none, excoriation- none Lymphadenopathy- none Head- atraumatic            Eyes- Gross vision  intact, PERRLA, conjunctivae and secretions clear            Ears- Hearing, canals-normal            Nose- Clear, no-Septal dev, mucus, polyps, erosion, perforation             Throat- Mallampati II , mucosa clear , drainage- none, tonsils- atrophic Neck- flexible , trachea midline, no stridor ,  thyroid nl, carotid no bruit Chest - symmetrical excursion , unlabored           Heart/CV- RRR , no murmur , no gallop  , no rub, nl s1 s2                           - JVD- none , edema- none, stasis changes- none, varices- none           Lung- clear to P&A, wheeze- none, cough- none , dullness-none, rub- none           Chest wall-  Abd-  Br/ Gen/ Rectal- Not done, not indicated Extrem- cyanosis- none, clubbing, none, atrophy- none, strength- nl Neuro- grossly intact to observation

## 2015-07-16 NOTE — Assessment & Plan Note (Signed)
She describes good compliance and control with CPAP and is sleeping better. Plan-add [employer] for download, try samples Belsomra 15 mg for sleep consolidation

## 2015-07-16 NOTE — Patient Instructions (Addendum)
We can continue CPAP 12/ [facility]  Samples to try Belsomra  15 mg at bedtime for sleep.  Order- DME [facility]- please add AirView and send latest pressure compliance download              Dx OSA  Follow up with Dr. Marchelle Gearingamaswamy as planned

## 2015-09-01 ENCOUNTER — Encounter (HOSPITAL_COMMUNITY): Payer: Self-pay | Admitting: Adult Health

## 2015-09-01 ENCOUNTER — Emergency Department (HOSPITAL_COMMUNITY)
Admission: EM | Admit: 2015-09-01 | Discharge: 2015-09-02 | Disposition: A | Discharge status: Home / Self Care | Payer: PPO | Attending: Emergency Medicine | Admitting: Emergency Medicine

## 2015-09-01 ENCOUNTER — Emergency Department (HOSPITAL_COMMUNITY): Payer: PPO

## 2015-09-01 DIAGNOSIS — R011 Cardiac murmur, unspecified: Secondary | ICD-10-CM | POA: Insufficient documentation

## 2015-09-01 DIAGNOSIS — E7800 Pure hypercholesterolemia, unspecified: Secondary | ICD-10-CM | POA: Insufficient documentation

## 2015-09-01 DIAGNOSIS — J069 Acute upper respiratory infection, unspecified: Secondary | ICD-10-CM

## 2015-09-01 DIAGNOSIS — Z7984 Long term (current) use of oral hypoglycemic drugs: Secondary | ICD-10-CM | POA: Insufficient documentation

## 2015-09-01 DIAGNOSIS — E119 Type 2 diabetes mellitus without complications: Secondary | ICD-10-CM | POA: Insufficient documentation

## 2015-09-01 DIAGNOSIS — G4730 Sleep apnea, unspecified: Secondary | ICD-10-CM | POA: Insufficient documentation

## 2015-09-01 DIAGNOSIS — M1990 Unspecified osteoarthritis, unspecified site: Secondary | ICD-10-CM | POA: Insufficient documentation

## 2015-09-01 DIAGNOSIS — Z794 Long term (current) use of insulin: Secondary | ICD-10-CM | POA: Insufficient documentation

## 2015-09-01 DIAGNOSIS — Z87891 Personal history of nicotine dependence: Secondary | ICD-10-CM | POA: Insufficient documentation

## 2015-09-01 DIAGNOSIS — E669 Obesity, unspecified: Secondary | ICD-10-CM | POA: Insufficient documentation

## 2015-09-01 DIAGNOSIS — I10 Essential (primary) hypertension: Secondary | ICD-10-CM | POA: Insufficient documentation

## 2015-09-01 DIAGNOSIS — K219 Gastro-esophageal reflux disease without esophagitis: Secondary | ICD-10-CM | POA: Insufficient documentation

## 2015-09-01 DIAGNOSIS — J441 Chronic obstructive pulmonary disease with (acute) exacerbation: Secondary | ICD-10-CM

## 2015-09-01 DIAGNOSIS — M5430 Sciatica, unspecified side: Secondary | ICD-10-CM | POA: Insufficient documentation

## 2015-09-01 DIAGNOSIS — Z9981 Dependence on supplemental oxygen: Secondary | ICD-10-CM | POA: Insufficient documentation

## 2015-09-01 DIAGNOSIS — Z7982 Long term (current) use of aspirin: Secondary | ICD-10-CM | POA: Insufficient documentation

## 2015-09-01 DIAGNOSIS — Z79899 Other long term (current) drug therapy: Secondary | ICD-10-CM | POA: Insufficient documentation

## 2015-09-01 LAB — CBC
HCT: 39.1 % (ref 36.0–46.0)
Hemoglobin: 12.6 g/dL (ref 12.0–15.0)
MCH: 27.2 pg (ref 26.0–34.0)
MCHC: 32.2 g/dL (ref 30.0–36.0)
MCV: 84.4 fL (ref 78.0–100.0)
Platelets: 234 10*3/uL (ref 150–400)
RBC: 4.63 MIL/uL (ref 3.87–5.11)
RDW: 14.8 % (ref 11.5–15.5)
WBC: 6.7 10*3/uL (ref 4.0–10.5)

## 2015-09-01 LAB — I-STAT TROPONIN, ED: Troponin i, poc: 0 ng/mL (ref 0.00–0.08)

## 2015-09-01 LAB — BASIC METABOLIC PANEL
Anion gap: 13 (ref 5–15)
BUN: 12 mg/dL (ref 6–20)
CO2: 26 mmol/L (ref 22–32)
Calcium: 9.3 mg/dL (ref 8.9–10.3)
Chloride: 94 mmol/L — ABNORMAL LOW (ref 101–111)
Creatinine, Ser: 1.01 mg/dL — ABNORMAL HIGH (ref 0.44–1.00)
GFR calc Af Amer: >60 mL/min (ref >60)
GFR calc non Af Amer: 60 mL/min — ABNORMAL LOW (ref >60)
Glucose, Bld: 106 mg/dL — ABNORMAL HIGH (ref 65–99)
Potassium: 3.5 mmol/L (ref 3.5–5.1)
Sodium: 133 mmol/L — ABNORMAL LOW (ref 135–145)

## 2015-09-01 MED ORDER — ALBUTEROL SULFATE (2.5 MG/3ML) 0.083% IN NEBU: 5.0000 mg | INHALATION_SOLUTION | Freq: Once | RESPIRATORY_TRACT | Status: DC

## 2015-09-01 MED ORDER — ALBUTEROL SULFATE (2.5 MG/3ML) 0.083% IN NEBU
INHALATION_SOLUTION | RESPIRATORY_TRACT | Status: AC
  Filled 2015-09-01: qty 3

## 2015-09-01 MED ORDER — IPRATROPIUM-ALBUTEROL 0.5-2.5 (3) MG/3ML IN SOLN
3.0000 mL | Freq: Once | RESPIRATORY_TRACT | Status: AC
  Administered 2015-09-01: 3 mL via RESPIRATORY_TRACT

## 2015-09-01 MED ORDER — ALBUTEROL SULFATE (2.5 MG/3ML) 0.083% IN NEBU
2.5000 mg | INHALATION_SOLUTION | Freq: Once | RESPIRATORY_TRACT | Status: AC
  Administered 2015-09-01: 2.5 mg via RESPIRATORY_TRACT

## 2015-09-01 NOTE — ED Notes (Signed)
Presents with SOB, chest congestion and tightness, productive cough ongoing for past few weeks. Has been back and forth with DR. And not having any relief. Crackles auscultated in left lower lobe. Productive cough with yellow sputum. Endorses fever and chills of 100.1 sats 99%

## 2015-09-02 MED ORDER — IPRATROPIUM-ALBUTEROL 0.5-2.5 (3) MG/3ML IN SOLN
3.0000 mL | Freq: Once | RESPIRATORY_TRACT | Status: AC
  Administered 2015-09-02: 3 mL via RESPIRATORY_TRACT
  Filled 2015-09-02: qty 3

## 2015-09-02 MED ORDER — CLARITHROMYCIN 500 MG PO TABS: 500.0000 mg | ORAL_TABLET | Freq: Two times a day (BID) | ORAL | Status: DC

## 2015-09-02 MED ORDER — PREDNISONE 10 MG PO TABS: 20.0000 mg | ORAL_TABLET | Freq: Two times a day (BID) | ORAL | Status: DC

## 2015-09-02 MED ORDER — PREDNISONE 20 MG PO TABS
40.0000 mg | ORAL_TABLET | Freq: Once | ORAL | Status: AC
  Administered 2015-09-02: 40 mg via ORAL
  Filled 2015-09-02: qty 2

## 2015-09-02 NOTE — Discharge Instructions (Signed)
Biaxin and Prednisone as prescribed.  Continue your nebulizer treatments every four hours as needed for wheezing/difficulty breathing.   Chronic Obstructive Pulmonary Disease Exacerbation Chronic obstructive pulmonary disease (COPD) is a common lung condition in which airflow from the lungs is limited. COPD is a general term that can be used to describe many different lung problems that limit airflow, including chronic bronchitis and emphysema. COPD exacerbations are episodes when breathing symptoms become much worse and require extra treatment. Without treatment, COPD exacerbations can be life threatening, and frequent COPD exacerbations can cause further damage to your lungs. CAUSES  Respiratory infections.  Exposure to smoke.  Exposure to air pollution, chemical fumes, or dust. Sometimes there is no apparent cause or trigger. RISK FACTORS  Smoking cigarettes.  Older age.  Frequent prior COPD exacerbations. SIGNS AND SYMPTOMS  Increased coughing.  Increased thick spit (sputum) production.  Increased wheezing.  Increased shortness of breath.  Rapid breathing.  Chest tightness. DIAGNOSIS Your medical history, a physical exam, and tests will help your health care provider make a diagnosis. Tests may include:  A chest X-ray.  Basic lab tests.  Sputum testing.  An arterial blood gas test. TREATMENT Depending on the severity of your COPD exacerbation, you may need to be admitted to a hospital for treatment. Some of the treatments commonly used to treat COPD exacerbations are:   Antibiotic medicines.  Bronchodilators. These are drugs that expand the air passages. They may be given with an inhaler or nebulizer. Spacer devices may be needed to help improve drug delivery.  Corticosteroid medicines.  Supplemental oxygen therapy.  Airway clearing techniques, such as noninvasive ventilation (NIV) and positive expiratory pressure (PEP). These provide respiratory support  through a mask or other noninvasive device. HOME CARE INSTRUCTIONS  Do not smoke. Quitting smoking is very important to prevent COPD from getting worse and exacerbations from happening as often.  Avoid exposure to all substances that irritate the airway, especially to tobacco smoke.  If you were prescribed an antibiotic medicine, finish it all even if you start to feel better.  Take all medicines as directed by your health care provider.It is important to use correct technique with inhaled medicines.  Drink enough fluids to keep your urine clear or pale yellow (unless you have a medical condition that requires fluid restriction).  Use a cool mist vaporizer. This makes it easier to clear your chest when you cough.  If you have a home nebulizer and oxygen, continue to use them as directed.  Maintain all necessary vaccinations to prevent infections.  Exercise regularly.  Eat a healthy diet.  Keep all follow-up appointments as directed by your health care provider. SEEK IMMEDIATE MEDICAL CARE IF:  You have worsening shortness of breath.  You have trouble talking.  You have severe chest pain.  You have blood in your sputum.  You have a fever.  You have weakness, vomit repeatedly, or faint.  You feel confused.  You continue to get worse. MAKE SURE YOU:  Understand these instructions.  Will watch your condition.  Will get help right away if you are not doing well or get worse.   This information is not intended to replace advice given to you by your health care provider. Make sure you discuss any questions you have with your health care provider.   Document Released: 04/26/2007 Document Revised: 07/20/2014 Document Reviewed: 03/03/2013 Elsevier Interactive Patient Education 2016 [employer]  Upper Respiratory Infection, Adult Most upper respiratory infections (URIs) are a viral infection of  the air passages leading to the lungs. A URI affects the nose, throat, and  upper air passages. The most common type of URI is nasopharyngitis and is typically referred to as "the common cold." URIs run their course and usually go away on their own. Most of the time, a URI does not require medical attention, but sometimes a bacterial infection in the upper airways can follow a viral infection. This is called a secondary infection. Sinus and middle ear infections are common types of secondary upper respiratory infections. Bacterial pneumonia can also complicate a URI. A URI can worsen asthma and chronic obstructive pulmonary disease (COPD). Sometimes, these complications can require emergency medical care and may be life threatening.  CAUSES Almost all URIs are caused by viruses. A virus is a type of germ and can spread from one person to another.  RISKS FACTORS You may be at risk for a URI if:   You smoke.   You have chronic heart or lung disease.  You have a weakened defense (immune) system.   You are very young or very old.   You have nasal allergies or asthma.  You work in crowded or poorly ventilated areas.  You work in health care facilities or schools. SIGNS AND SYMPTOMS  Symptoms typically develop 2-3 days after you come in contact with a cold virus. Most viral URIs last 7-10 days. However, viral URIs from the influenza virus (flu virus) can last 14-18 days and are typically more severe. Symptoms may include:   Runny or stuffy (congested) nose.   Sneezing.   Cough.   Sore throat.   Headache.   Fatigue.   Fever.   Loss of appetite.   Pain in your forehead, behind your eyes, and over your cheekbones (sinus pain).  Muscle aches.  DIAGNOSIS  Your health care provider may diagnose a URI by:  Physical exam.  Tests to check that your symptoms are not due to another condition such as:  Strep throat.  Sinusitis.  Pneumonia.  Asthma. TREATMENT  A URI goes away on its own with time. It cannot be cured with medicines, but  medicines may be prescribed or recommended to relieve symptoms. Medicines may help:  Reduce your fever.  Reduce your cough.  Relieve nasal congestion. HOME CARE INSTRUCTIONS   Take medicines only as directed by your health care provider.   Gargle warm saltwater or take cough drops to comfort your throat as directed by your health care provider.  Use a warm mist humidifier or inhale steam from a shower to increase air moisture. This may make it easier to breathe.  Drink enough fluid to keep your urine clear or pale yellow.   Eat soups and other clear broths and maintain good nutrition.   Rest as needed.   Return to work when your temperature has returned to normal or as your health care provider advises. You may need to stay home longer to avoid infecting others. You can also use a face mask and careful hand washing to prevent spread of the virus.  Increase the usage of your inhaler if you have asthma.   Do not use any tobacco products, including cigarettes, chewing tobacco, or electronic cigarettes. If you need help quitting, ask your health care provider. PREVENTION  The best way to protect yourself from getting a cold is to practice good hygiene.   Avoid oral or hand contact with people with cold symptoms.   Wash your hands often if contact occurs.  There is  no clear evidence that vitamin C, vitamin E, echinacea, or exercise reduces the chance of developing a cold. However, it is always recommended to get plenty of rest, exercise, and practice good nutrition.  SEEK MEDICAL CARE IF:   You are getting worse rather than better.   Your symptoms are not controlled by medicine.   You have chills.  You have worsening shortness of breath.  You have Kyne or red mucus.  You have yellow or Freeney nasal discharge.  You have pain in your face, especially when you bend forward.  You have a fever.  You have swollen neck glands.  You have pain while swallowing.  You  have white areas in the back of your throat. SEEK IMMEDIATE MEDICAL CARE IF:   You have severe or persistent:  Headache.  Ear pain.  Sinus pain.  Chest pain.  You have chronic lung disease and any of the following:  Wheezing.  Prolonged cough.  Coughing up blood.  A change in your usual mucus.  You have a stiff neck.  You have changes in your:  Vision.  Hearing.  Thinking.  Mood. MAKE SURE YOU:   Understand these instructions.  Will watch your condition.  Will get help right away if you are not doing well or get worse.   This information is not intended to replace advice given to you by your health care provider. Make sure you discuss any questions you have with your health care provider.   Document Released: 12/23/2000 Document Revised: 11/13/2014 Document Reviewed: 10/04/2013 Elsevier Interactive Patient Education [employer]

## 2015-09-02 NOTE — ED Notes (Signed)
Pt departed in NAD.  

## 2015-09-02 NOTE — ED Provider Notes (Signed)
CSN: [id number]     Arrival date & time 09/01/15  1845 History   By signing my name below, I, Nicole Kindred, attest that this documentation has been prepared under the direction and in the presence of Veryl Speak, MD.   Electronically Signed: Nicole Kindred, ED Scribe. 09/02/2015. 12:19 AM     Chief Complaint  Patient presents with  . Shortness of Breath     Patient is a 60 y.o. female presenting with shortness of breath. The history is provided by the patient. No language interpreter was used.  Shortness of Breath Severity:  Moderate Onset quality:  Gradual Duration:  1 month Timing:  Constant Progression:  Worsening Chronicity:  New Relieved by:  Nothing Worsened by:  Nothing tried Ineffective treatments: nebulizer. Associated symptoms: cough, fever and sputum production   Associated symptoms: no chest pain    HPI Comments: [name] is a 60 y.o. female with PMHx of COPD who presents to the Emergency Department complaining of gradual onset, constant chest tightness and congestion, ongoing for about a month. Pt reports associated, worsening fever, shortness of breath, productive cough which prompted her to come to the ED tonight. Pt reports that she has seen her PCP and was given a steroidal shot but it did not relief her symptoms. No other worsening or alleviating factors noted. Pt denies any other pertinent symptoms. Pt does use inhaler at home for respiratory issues.     Past Medical History  Diagnosis Date  . HTN (hypertension)   . Obesity   . Asthma   . Sciatica   . Rotator cuff tear   . Anxiety   . COPD (chronic obstructive pulmonary disease) ([place])   . Sleep apnea     CPAP, sleep study on [place]  . Headache(784.0)   . Environmental allergies     "year round"  . High cholesterol   . Cold extremity without peripheral vascular disease     bilaterally; "from my knees down into my feet; they get ice cold"  . Heart murmur   . Chronic bronchitis ([place])   .  Vocal cord dysfunction     "I've had it for many years"  . Exertional dyspnea   . Type II diabetes mellitus ([place])   . GERD (gastroesophageal reflux disease)   . Arthritis     "both shoulders"  . Sciatic nerve pain     "tailbone down into my legs when it flares up"  . Blood transfusion without reported diagnosis   . Hiatal hernia    Past Surgical History  Procedure Laterality Date  . Nasal sinus surgery  1992  . Toe surgery      for ingrown [id number] ?left  . Shoulder arthroscopy w/ rotator cuff repair  01/28/12    left  . Appendectomy  1976  . Oophorectomy  1976    w/ovarian cyst excision and appy  . Total abdominal hysterectomy  1985  . Abdominal adhesion surgery  ~ 1978   Family History  Problem Relation Age of Onset  . Heart disease Mother   . Asthma Mother   . Heart failure Maternal Grandmother   . Asthma Son   . Asthma Daughter   . Lupus Daughter   . Colon cancer Neg Hx   . Throat cancer Neg Hx   . Stomach cancer Neg Hx   . Esophageal cancer Neg Hx   . Rectal cancer Neg Hx   . Diabetes      "everybody on both sides"  Social History  Substance Use Topics  . Smoking status: Former Smoker -- 1.00 packs/day for 25 years    Types: Cigarettes    Quit date: 07/13/1996  . Smokeless tobacco: Never Used  . Alcohol Use: No     Comment: 01/29/12 "used to abuse alcohol; last drink was in the 1980's"   OB History    No data available     Review of Systems  Constitutional: Positive for fever.  HENT: Positive for congestion.   Respiratory: Positive for cough, sputum production and shortness of breath.   Cardiovascular: Negative for chest pain.  All other systems reviewed and are negative.     Allergies  Lisinopril  Home Medications   Prior to Admission medications   Medication Sig Start Date End Date Taking? Authorizing Provider  albuterol (PROVENTIL HFA;VENTOLIN HFA) 108 (90 BASE) MCG/ACT inhaler Inhale 2 puffs into the lungs every 4 (four) hours as  needed for wheezing or shortness of breath.     Historical Provider, MD  ALPRAZolam Duanne Moron) 0.5 MG tablet Take 0.5 mg by mouth 2 (two) times daily as needed.     Historical Provider, MD  aspirin 81 MG tablet Take 81 mg by mouth 2 (two) times daily.     Historical Provider, MD  atorvastatin (LIPITOR) 20 MG tablet Take 20 mg by mouth every evening.     Historical Provider, MD  Azelastine-Fluticasone (DYMISTA) 137-50 MCG/ACT SUSP Place into the nose daily.    Historical Provider, MD  BELSOMRA 15 MG TABS Take 1 tablet by mouth daily. 07/16/15   Deneise Lever, MD  chlorpheniramine (CHLOR-TRIMETON) 4 MG tablet 2 tabs by mouth at bedtime    Historical Provider, MD  citalopram (CELEXA) 10 MG tablet Take 1 tablet by mouth daily. 05/08/13   Historical Provider, MD  cyclobenzaprine (FLEXERIL) 10 MG tablet Take 1 tablet by mouth as needed.    Historical Provider, MD  gabapentin (NEURONTIN) 300 MG capsule Take 1 capsule by mouth 3 (three) times daily.  05/08/13   Historical Provider, MD  HUMULIN N KWIKPEN 100 UNIT/ML Kiwkpen Inject 30 Units into the skin 2 (two) times daily.    Historical Provider, MD  Linaclotide Rolan Lipa) 290 MCG CAPS capsule Take 1 capsule (290 mcg total) by mouth daily. 08/22/14   Jerene Bears, MD  loratadine (CLARITIN) 10 MG tablet Take 10 mg by mouth every morning.    Historical Provider, MD  losartan (COZAAR) 100 MG tablet Take 1 tablet by mouth daily.    Historical Provider, MD  metFORMIN (GLUCOPHAGE) 1000 MG tablet Take 1 tablet by mouth 2 (two) times daily. 04/15/13   Historical Provider, MD  metoCLOPramide (REGLAN) 5 MG/5ML solution Take 5 mLs (5 mg total) by mouth 4 (four) times daily -  before meals and at bedtime. 09/13/14   Lori P Hvozdovic, PA-C  pantoprazole (PROTONIX) 40 MG tablet Take 1 tablet (40 mg total) by mouth 2 (two) times daily. 06/17/15   Lori P Hvozdovic, PA-C  traMADol (ULTRAM) 50 MG tablet Take 50 mg by mouth every 6 (six) hours as needed for pain.     Historical  Provider, MD  VICTOZA 18 MG/3ML SOPN Inject 18 mg into the skin every morning.  03/14/13   Historical Provider, MD   BP 147/63 mmHg  Pulse 80  Temp(Src) 100.1 F (37.8 C) (Oral)  Resp 30  SpO2 98% Physical Exam  Constitutional: She is oriented to person, place, and time. She appears well-developed and well-nourished. No distress.  HENT:  Head: Normocephalic and atraumatic.  Eyes: EOM are normal.  Neck: Normal range of motion.  Cardiovascular: Normal rate, regular rhythm and normal heart sounds.   No murmur heard. Pulmonary/Chest: Effort normal. No respiratory distress. She has wheezes. She has no rales.  Expiratory wheezes in bases bilaterally.   Abdominal: Soft. She exhibits no distension. There is no tenderness.  Musculoskeletal: Normal range of motion.  Neurological: She is alert and oriented to person, place, and time.  Skin: Skin is warm and dry.  Psychiatric: She has a normal mood and affect. Judgment normal.  Nursing note and vitals reviewed.   ED Course  Procedures (including critical care time) DIAGNOSTIC STUDIES: Oxygen Saturation is 98% on RA, normal by my interpretation.    COORDINATION OF CARE: 12:19 AM-Discussed treatment plan which includes deltasone with pt at bedside and pt agreed to plan.     Labs Review Labs Reviewed  BASIC METABOLIC PANEL - Abnormal; Notable for the following:    Sodium 133 (*)    Chloride 94 (*)    Glucose, Bld 106 (*)    Creatinine, Ser 1.01 (*)    GFR calc non Af Amer 60 (*)    All other components within normal limits  CBC  I-STAT TROPOININ, ED    Imaging Review Dg Chest 2 View  09/01/2015  CLINICAL DATA:  Recent sinus and chest congestion with shortness of breath and productive cough. History of hypertension and COPD. EXAM: CHEST  2 VIEW COMPARISON:  Portable chest 04/06/2015.  CT 06/03/2015. FINDINGS: The heart size and mediastinal contours are stable. There is stable prominence of the pulmonary vasculature. No edema,  confluent airspace opacity, pleural effusion or pneumothorax is demonstrated. The bones appear unchanged. IMPRESSION: Stable chest.  No acute cardiopulmonary process. Electronically Signed   By: Richardean Sale M.D.   On: 09/01/2015 20:01   I have personally reviewed and evaluated these images and lab results as part of my medical decision-making.  ED ECG REPORT   Date: 09/02/2015  Rate: 85  Rhythm: normal sinus rhythm  QRS Axis: right  Intervals: normal  ST/T Wave abnormalities: nonspecific T wave changes  Conduction Disutrbances:none  Narrative Interpretation:   Old EKG Reviewed: none available  I have personally reviewed the EKG tracing and agree with the computerized printout as noted.   MDM   Final diagnoses:  None   Patient is a 60 year old female with history of COPD. She presents for evaluation of a several week history of chest congestion and productive cough. She is seeing her doctor and had no relief with a steroid shot. She is doing breathing treatments at home with little relief. Her chest x-ray today is clear and laboratory studies are otherwise unremarkable. She was treated with a DuoNeb here in the ER and will be discharged with Biaxin, prednisone, and continued nebulizer treatments for what I feel is a bronchitis. To return as needed for any problems.  I personally performed the services described in this documentation, which was scribed in my presence. The recorded information has been reviewed and is accurate.         Veryl Speak, MD 09/02/15 [phone]

## 2015-09-17 ENCOUNTER — Ambulatory Visit: Payer: PPO | Admitting: Internal Medicine

## 2015-09-18 ENCOUNTER — Ambulatory Visit (INDEPENDENT_AMBULATORY_CARE_PROVIDER_SITE_OTHER): Payer: PPO | Admitting: Internal Medicine

## 2015-09-18 ENCOUNTER — Encounter: Payer: Self-pay | Admitting: Internal Medicine

## 2015-09-18 VITALS — BP 110/62 | HR 65 | Temp 98.1°F | Ht 67.0 in | Wt 226.0 lb

## 2015-09-18 DIAGNOSIS — J849 Interstitial pulmonary disease, unspecified: Secondary | ICD-10-CM | POA: Diagnosis not present

## 2015-09-18 DIAGNOSIS — J387 Other diseases of larynx: Secondary | ICD-10-CM | POA: Diagnosis not present

## 2015-09-18 DIAGNOSIS — R05 Cough: Secondary | ICD-10-CM | POA: Diagnosis not present

## 2015-09-18 DIAGNOSIS — R053 Chronic cough: Secondary | ICD-10-CM

## 2015-09-18 NOTE — Patient Instructions (Addendum)
ICD-9-CM ICD-10-CM   1. Chronic cough 786.2 R05   2. ILD (interstitial lung disease) (HCC) 515 J84.9   3. Irritable larynx syndrome 478.79 J38.7    Improved but vocal cord issues seems to predominate despite gabapentin treatment Glad swallow study in dec 2016 turned out okay  Plan  - refer back to ENT   - too badd insurance will not let you see Dr Delford FieldWright in [place]  - [facility]  Followup Nov 2017 or sooner if needed

## 2015-09-18 NOTE — Progress Notes (Signed)
Subjective:     Patient ID: [name], female   DOB: 1955/11/25, 60 y.o.   MRN: [id number]  HPI  OV 02/17/2011. 60 year old female. Obese. AA female. Remote smoker On cpap for OSA (pmd managing). REferred by Dr Delfina Redwood.    New visit for chronic cough. Preesent for many years. Insidious onset.  Progressively with more frequency past few years. Cough rated as moderate. Usually dry cough with associated barking quality. Occ mucus present; thick and clear. Cough worsened by milk products, change in weather and dust. Hx of prior ENT eval in [place] > 15 years ago: diagnosed as "vocal cords sticking together" and remote hx of sinus surgery Also diagnosed with GERD in [place] > 15 years ago; takes protonix.  [facility] Reflux Symptom INdex score is 39 and very c/w LPR cough(severe hoarsness of voice, clearing of throat, ecess throat mucus, post nasal drip, coughing after lying down or eating, choking epidoses, sensation of globus with food sticking in thorat) and associated heart burn. Cough so severe that she could not complete spirometry in office today  Has dyspnea too: few years, insidious onset. Progressive. Rates it as moderate. Dyspnea brought on by associated chest tightness and nasal drainage or even exertion like climbing flight of steps. Dyspnea relieved by rest. Cough and dyspnea associated with wheezing and constant yellow sinus drainage as well. Frequent nocturnal awakenings with choking present. Above resulted in asthma diagnosis versus tracheobronchiolotis > 15 years ago: on advair since then which she is unsure is helping. She herself is not convinced she has asthma.   Associated med intake shows tramadol (for shoulder pain and sicatica), fish oil/ flaxseed (2 years) and ACE inhibitor intake (been on lisniopril for > 10 years due to diabetes)   Pas med hx review fromn outside record notes "hx of asthma and vocal cord dysfunction and post nasal drip. Recalls frequent ER visits atleast one per  month (last er visit 2 months ago per hx) to [place]. Denies ICU admits, intubations for same.   REC Cough is from ACE Inhibitor, sinus drainage, vocal cord dysfunction, acid reflux and possible cough all conspiring to cause cyclical cough/LPR cough #Sinus drainage  - continue nasal steroid - refer to [facility] Dr. Wilburn Cornelia or Dr Redmond Baseman #Possible Acid Reflux  - continue protonix   - take diet sheet from [place] - avoid colas, spices, cheeses, spirits, red meats, beer, chocolates, fried foods etc.,   - sleep with head end of bed elevated  - eat small frequent meals  - do not go to bed for 3 hours after last meal - stop fish oil and flaxseed for now #Asthma   - continue advair for now - might have to consider changing over to something else later depending on ENT evaluation #Vocal Cord Dysfunction - see ENT doctor first  -later  will consider referral to speech therapy with [name] #BP  - stop lisinopril - take benicar 1 tablet daily - take sample, nurse will add it in med list #Followup - I will see you in 4-6 weeks.  - Reattempt spirometry with [name] at time of followup -Important you comply with advice 100% correctly   OV 03/31/11: Followup cough. SAw ENT 89/17./12: no anatomic path or VCD noticed at time of exam though patient noted to have choked.  She states that she was not happy with her experience at ENT visit and does not want to visit the same office again. She is compliant with instructions  except for diet though she has made improvements there. Off lisniopril. Follows sinus, gerd and astham advice. Does not like advair diskus. Overall cough is much better. She is happy with improvement. RSI cough score dropped from 39 to 14.  She sses her hoarsenss, clearing and pos nasal drip as mild-moderate  PFTS show no more flow volume loop issues. There is restriction with mixed obstruction. DLCO 67%   REC Cough is from sinus drainage, vocal cord dysfunction, acid  reflux and possible cough all conspiring to cause cyclical cough/LPR cough  #Sinus drainage  - continue nasal steroid - nurse will do script for generic fluticasone -  #Acid Reflux  - continue protonix   - take diet sheet from [place] [facility] - avoid colas, spices, cheeses, spirits, red meats, beer, chocolates, fried foods etc.,   - sleep with head end of bed elevated  - eat small frequent meals  - do not go to bed for 3 hours after last meal - cotninue to avoid fish oil and flaxseed   #Asthma   - continue advair but nurse will give you sample of HFA and teach you how to take it with spacer; she will do script as well  #Vocal Cord Dysfunction - referral to speech therapy with [name]  #BP  - will add lisinopril to allergy list - take benicar 1 tablet daily or the equivalent Dr Delfina Redwood gives you  #Followup - I will see you in 8 weeks.  - -Important you comply with advice 100% correctly - Glad you are much better - Flu shot today please   OV 06/26/2011 Followup for chronic cough. She continues to do better. Subjectively feels much better compared to last visit. Although objectively her cough score has only reduced by 1 point. Today, RSI cough score is 13. Level III postnasal drip. Level to hoarseness of voice, cough after lying down, choking episodes, and annoying cough. Level I sensation of lump in throat and heartburn.  In talking to her she is compliant with her nasal steroid and her Protonix for acid reflux but not compliant with diet for acid reflux. She is on Advair HFA with spacer for presumed asthma and she feels this makes her cough less compared to Advair discus. We discussed more about her asthma history and she says that she was never sure she had asthma diagnosis in [place] it was a presumptive diagnosis. She is eager to come off Advair and give a trial without it. She also gives a history of allergies and allergy shots while living in [place] and she is open to an  allergy evaluation right now. In terms of cyclical cough she has finished speech therapy evaluation and graduated from the program. Overall she feels quality-of-life is acceptable although she would like to see cough improve further. So far we have not tried Neurontin    Past, Family, Social reviewed: no change since last visit except that Dr Deforest Hoyles is new PMD. She now states she has year round allergies. Recollects while in [place] used to take allergy shots twice a week 10 years ago. Since moving to [place] 4 years ago not seen an allergist. Laverle Hobby to see allergist. She feels she does not have asthma.  Cough is from sinus drainage, vocal cord dysfunction, acid reflux and possible asthma or allergiesh all working together to cause cyclical cough/LPR cough  #Sinus drainage  - continue nasal steroid - nurse will do script for generic fluticasone  -  #Acid Reflux  - continue  protonix  - contniue (REALLY IMPORANT IS DIET) diet sheet from [place] [facility] - avoid colas, spices, cheeses, spirits, red meats, beer, chocolates, fried foods etc.,  - sleep with head end of bed elevated  - eat small frequent meals  - do not go to bed for 3 hours after last meal  - cotninue to avoid fish oil and flaxseed  #Asthma  - since diagnosis of asthma was in doubt. Please stop Advair and just use albuterol as needed  -We'll do spirometry at followup  # allergies  - Please see Dr. Baird Lyons in our office for allergy evaluation; will do referral  #Vocal Cord Dysfunction  - congratulations on graduating from program with speech therapy [name]  #BP  - Continue losartan  #Followup  - I will see you in 8 weeks.  - -Important you comply with advice 100% correctly  - Glad you are much better  - Cough score worksheet at followup   OV 08/25/2011 Followup for chronic cough. She is off advair as advised. She is doing gerd control.  Ojectively her cough score is not reduced. Last visit score was 13 but now it is 14.  (Level III postnasal drip, and post nasal drip. . Level 2  hoarseness of voice and choking episodes. Level 1 - cough after lying down,and  sensation of lump in throat and heartburn). Howevefr, subjectively cough much improved. Still with post nasal drip. Has seen Dr Annamaria Boots for allergy 08/06/11 and 08/19/11. RAST serum profile negative except for elevated [place] IGE. Allergy skin test pending 09/16/11. On visit 08/19/11 to Dr Annamaria Boots had atypical chest pain - releived with ppi in office. EKG okay. Trop was normal. . Of note, Dr Annamaria Boots is also addressing her prior OSA issues.   ROS c/o anxiety, insomnia, also chest pains  - atypical  Spirometry  - fev1 1.74L/72%. Ratio 78    #Chest pains  - please see [place] cardiology asap - > equivocal stress test and but clinically low probability February 2013; placed in clinical followup #Cough  - for sinus: follow Dr Annamaria Boots recommendation  - for acid reflux: follow medications and diet  - for possible asthma: stay off advair and once cardiology ok, please have methacholine challenge test  #Followup  - complete cardiology visit and methacholine challenge  - 4-6 weeks from now  09/16/11 with Dr Annamaria Boots Allergist- 52 yoF former smoker referred by Dr Chase Caller who has been seeing her for multifactorial cough, questioning an allergic component At last visit she had presented with chest pain, preventing planned allergy skin testing. Now following with cardiology and pending CT scan of her heart/Dr. Burt Knack. Still complains of nasal congestion. Able to use her CPAP more regularly when her nose is not stopped up. Unable to do methacholine challenge last month because her baseline is from a tree scores were too low. CPAP autotitration done/[facility]. Pending download. PFT- 09/11/11- FVC 2.15/68%, FEV1 1.59/ 64%, FEV1/FVC 0.80. FEF 25-75 % 0.37/ 15%   OV 03/21/2013 Followup chronic cough  - I have not seen her in nearly 15 months. She says that basically her cough resolved but  cough is no longer chronic and only occurs intermittently when she has "bronchitis". In the last year she's had antibiotics and prednisone for acute episodes of coughing at least 3 times. In between episodes she is asymptomatic. Currently she feels she's having one such episode in the past 3 weeks with chest congestion, cough with white mucus, fatigue and associated wheeze. Currently RSI  cough score is 21 and show severe cough.  - Of note, she has not had methacholine challenge test that I advised a year and half ago. She says that she does not have vocal cord dysfunction based on Dr. Mickie Hillier exam in August 2012 ENT physician   REC levaquin and pred burst  04/20/2013 Follow up and Med review  new med calendar - pt brought all meds with her today.  does report some sinus pressure, congestion, SOB, wheezing, chest tightness. Is feeling better but still gets into coughing fits that takes her a while to calm down. Strong odors seem to causes attacks along with laughing  She did have an attack in the office. Sats were normal however she has significant upper airway psuedowheezing w/ barking cough.  No fever , chest pain, over reflux , orthopnea or edema.  No using CPAP lately   REC top Advair . Add Chlor trimeton '4mg'$  2 At bedtime   Change Loratadine '10mg'$  in am.  Add Pepcid '20mg'$  At bedtime   Avoid throat clearing , use sips of water.  Saline nasal rinses Twice daily  .  NO MINT PRODUCTS  GERD diet .  We are setting you up for a CT neck .  Restart CPAP At bedtime   follow up Dr. Chase Caller in 4 weeks.  Please contact office for sooner follow up if symptoms do not improve or worsen or seek emergency care    OV  05/26/2013 Chief Complaint  Patient presents with  . Follow-up    Pt c/o chest tightness, dry cough, wheezing and sob. Pt states this was some better while she was on prednisone. Last dose x 3 weeks ago.   Followup for chronic cough. She says her last 2-3 weeks that she's having  an acute bronchitis flare with yellow phlegm. She feels she needs antibiotics and prednisone. In fact in the time that she last saw me in September 2014 she's had another round of antibiotics and prednisone with her primary care physician Dr. Deforest Hoyles. She feels her chest is congested although in the office she is clearly having a barking cough, laryngeal spasm and upper airway noise intermittently with changes in voice. It is fairly significant. I reminded her that this is consistent with vocal cord dysfunction but she said that her ear nose throat surgeon cleared her for the same. Of note, she had a CT scan of the neck and this was normal.  07/26/2013 Follow up  2 month follow up - reports some  congestion, head congestion w/ light yellow mucus that is on/off. Has sinus drainage daily . Is not using claritin or chlortrimeton as recommended.  Was taken off ACE in past , now on Cozaar.   RSI cough score unchanged at 21  Had Esophagram on 1/8 w/ mild GERD/HH. No sign delay emptying .  No chest pain, orthopnea, edema , fever.  +hoarseness on/off.   She says she feels so much better with CPAP , using during daytime for naps and At bedtime  . Is amazed at how much she feels better.    OV 03/20/2014  Chief Complaint  Patient presents with  . Follow-up    Pt states she is unable to hold her voice as long when she sings. Pt c/o PND and prod cough, pt thinks its d/t allergies. Pt c/o chest soreness on right upper chest.    Followup chronic cough  - I have not seen the patient in nearly 10 months. Overall cough was  stable but recently she has had a flareup. I'm not so sure that she is followed instructions compliantly. It took me a while to understand the medication regimen but it appears that she takes dymista for  Sinuses but saline wash as needed, and not taking claritin, Advair as needed for her lungs, Protonix schedule followup acid reflux but Pepcid only as needed. She is only somewhat compliant with  the Neurontin. She continues to have sinus drainage, clearing of the throat and the laryngeal/barking cough that she feels is coming from the throat. This is associated with dyspnea and is of moderate severity, and some wheezing but she feels wheeze is upper airway  REC #Chronic cough  - I thnk cough is related to voice box issues and loss of neural control of the same - Please take prednisone 40 mg x1 day, then 30 mg x1 day, then 20 mg x1 day, then 10 mg x1 day, and then 5 mg x1 day and stop - refer Dr Ernestine Conrad of [facility]NT for chronic cough - for now continue current nasal, acid reflux and neurontin therapies - continue albuterol as needed ; for now hold off scheduled advair - repeat HRCT chest wo contrast for chronic cough  #FOllowup  3 months - after you see Dr Joya Gaskins of [facility]  Come sooner if there are problems o  OV 10/22/2014  Chief Complaint  Patient presents with  . Follow-up    Pt stated her cough has improved. Pt c/o hoarseness and chest tightness. Pt is wearing CPAP nightly and denies any issues with machine, mask or pressure.     FU chronic cough  - IN interim in Sept 2015 had CT chest : CT chest 03/26/14 shows worsening infiltreats in bases compaed to nov 2014. COncern for aspiration and referred  To GI. She does not know details of GI Visit but review of records shows she has gastroparesis and hs been started on reglan. Also she saw Dr Joya Gaskins of ENT in [place]. WIth above measure cough has resolved. /nearly resolved. No respiratory issues.   reports that she quit smoking about 18 years ago. Her smoking use included Cigarettes. She has a 25 pack-year smoking history. She has never used smokeless tobacco.   OV 11/12/2014  Chief Complaint  Patient presents with  . Follow-up    Pt here after PFT. Pt stated her breathing has slightly improved,she is less SOB. Pt stated her cough has also imrpoved. Pt has not yet had autoimmune panel.    Follow-up  chronic cough with interstitial lung disease findings  As of last visit her cough resolved. Cough was related to aspiration. As soon as she started following gastroenterology advice cough resolved. However follow-up CT scan of the chest in April 2016 compared to fall 2015 showed persistent ILD findings as described below [personally reviewed image]. At this point she is asymptomatic. I had her do pulmonary function test and autoimmune workup and reports for follow-up. Of these she only completed pulmonary function test that shows restriction with slight reduction in diffusion capacity [some of the restriction can be due to obesity]. She continues to be asymptomatic. She's not having reflux symptoms anymore. Of note, she forgot to have a autoimmune test. She does admit that her daughter has lupus   IMPRESSION: CT chest 10/29/2014 personally reviewed image  1. Stable findings of peribronchovascular ground-glass attenuation, predominantly in the basal portions of the lungs bilaterally, again favored to reflect nonspecific interstitial pneumonia (NSIP). 2. Mild air  trapping, indicative of mild small airways disease. Electronically Signed  By: Vinnie Langton M.D.  On: 10/29/2014 16:19  Pulmonary function test 10/31/2014 shows restriction with reduced diffusion capacity. FVC 2.1 L/69%, FEV1 1.8 L/75% and ratio of 86. No broncho-dilator response. Total lung capacity 3.5 L/63% and DLCO 20.0/70% Sleep Apnea FOLLOWS FOR: pt wearing cpap nightly, c/o mask getting hot sometimes qhs.  has trouble staying asleep.     OV 06/10/2015  Chief Complaint  Patient presents with  . Follow-up    Pt states she has been getting more frequent sinus infections, slight increase in SOB, chest heaviness. Pt here after HRCT. Pt wearing CPAP nightly.    Follow-up multifactorial chronic cough Follow-up mild basal interstitial lung disease not otherwise specified  She currently reports that she is not having much  cough although after I left the office I heard a cough and periodically she did have variable upper airway noise. She continues to have gastroparesis related acid reflux particularly when she eats food she regurgitates. She last saw GI March 2016 and was asked to increase and metoclopramide. She's not having much dyspnea. Overall she feels okay. She is having some atypical chest pain going on for a few months in the right supramammary area   07/16/2015-60 year old female former smoker followed by Dr. Chase Caller for mild ILD, recurrent sinus infections, chronic cough, Sleep Apnea FOLLOWS FOR: pt wearing cpap nightly, c/o mask getting hot sometimes qhs.  has trouble staying asleep.   Here for CPAP follow-up. "Loves it" and says she is wearing it all night every night. Fullface mask, [facility]/12.  Not noticing active respiratory problems currently with little cough    OV  09/18/2015  Chief Complaint  Patient presents with  . Follow-up    pt recently seen at [facility] ED for URI- pt c/o prod cough with thick clear mucus, SOB with exertion, wheezing.     Follow-up chronic cough which is multifactorial with a strong component of irritable larynx syndrome, vocal cord dysfunction and gastroparesis related to diabetes  Follow-up interstitial lung disease probably as a result of above but etiology uncertain   I last saw her in November 2016. She was supposed to follow-up with me in November 2017. In December 2016 I did make her see GI. They did a swallow study. I reviewed those results and she did not aspirate but coughed significantly during swallowing. No specific interventions have been recommended. It appears she has increase and metoclopramide. Then 09/01/2015 ended up in the ER with shortness of breath and wheezing episode and was given antibiotics and prednisone according to her history. Now she significantly improved and close to baseline. She does have some wheezing but she says it's all from the upper  airway. She does not want another course of anabiotic's of prednisone. She is on Neurontin but this is not helping. Her creatinine was 1 mg percent troponin was normal and hemoglobin was normal 09/01/2015. She had chest x-ray 09/01/2015 and is clear per report. I did not visualize this film.  In terms of her vocal cord dysfunction review of my prior notes shows that she is seen ENT multiple times. Most recently she saw [facility] ENT Dr. Joya Gaskins and she had resolution but now the cough due to irritable larynx has come back. She says she cannot go back to [facility] because of insurance reasons. She is willing to reestablish with the ENT locally.    Current outpatient prescriptions:  .  albuterol (PROVENTIL HFA;VENTOLIN HFA) 108 (90 BASE)  MCG/ACT inhaler, Inhale 2 puffs into the lungs every 4 (four) hours as needed for wheezing or shortness of breath. , Disp: , Rfl:  .  ALPRAZolam (XANAX) 0.5 MG tablet, Take 0.5 mg by mouth 2 (two) times daily as needed. , Disp: , Rfl:  .  aspirin 81 MG tablet, Take 81 mg by mouth 2 (two) times daily. , Disp: , Rfl:  .  atorvastatin (LIPITOR) 20 MG tablet, Take 20 mg by mouth every evening. , Disp: , Rfl:  .  Azelastine-Fluticasone (DYMISTA) 137-50 MCG/ACT SUSP, Place into the nose daily., Disp: , Rfl:  .  BELSOMRA 15 MG TABS, Take 1 tablet by mouth daily., Disp: 6 tablet, Rfl: 0 .  chlorpheniramine (CHLOR-TRIMETON) 4 MG tablet, 2 tabs by mouth at bedtime, Disp: , Rfl:  .  citalopram (CELEXA) 10 MG tablet, Take 1 tablet by mouth daily., Disp: , Rfl:  .  cyclobenzaprine (FLEXERIL) 10 MG tablet, Take 1 tablet by mouth as needed., Disp: , Rfl:  .  gabapentin (NEURONTIN) 300 MG capsule, Take 1 capsule by mouth 3 (three) times daily. , Disp: , Rfl:  .  HUMULIN N KWIKPEN 100 UNIT/ML Kiwkpen, Inject 30 Units into the skin 2 (two) times daily., Disp: , Rfl:  .  Linaclotide (LINZESS) 290 MCG CAPS capsule, Take 1 capsule (290 mcg total) by mouth daily., Disp: 30 capsule,  Rfl: 2 .  loratadine (CLARITIN) 10 MG tablet, Take 10 mg by mouth every morning., Disp: , Rfl:  .  losartan (COZAAR) 100 MG tablet, Take 1 tablet by mouth daily., Disp: , Rfl:  .  metFORMIN (GLUCOPHAGE) 1000 MG tablet, Take 1 tablet by mouth 2 (two) times daily., Disp: , Rfl:  .  metoCLOPramide (REGLAN) 5 MG/5ML solution, Take 5 mLs (5 mg total) by mouth 4 (four) times daily -  before meals and at bedtime., Disp: 600 mL, Rfl: 1 .  pantoprazole (PROTONIX) 40 MG tablet, Take 1 tablet (40 mg total) by mouth 2 (two) times daily., Disp: 60 tablet, Rfl: 3 .  traMADol (ULTRAM) 50 MG tablet, Take 50 mg by mouth every 6 (six) hours as needed for pain. , Disp: , Rfl:  .  VICTOZA 18 MG/3ML SOPN, Inject 18 mg into the skin every morning. , Disp: , Rfl:      Review of Systems     Objective:   Physical Exam  Constitutional: She is oriented to person, place, and time. She appears well-developed and well-nourished. No distress.  HENT:  Head: Normocephalic and atraumatic.  Right Ear: External ear normal.  Left Ear: External ear normal.  Mouth/Throat: Oropharynx is clear and moist. No oropharyngeal exudate.  Significant upper airway wheeze present  Eyes: Conjunctivae and EOM are normal. Pupils are equal, round, and reactive to light. Right eye exhibits no discharge. Left eye exhibits no discharge. No scleral icterus.  Neck: Normal range of motion. Neck supple. No JVD present. No tracheal deviation present. No thyromegaly present.  Cardiovascular: Normal rate, regular rhythm, normal heart sounds and intact distal pulses.  Exam reveals no gallop and no friction rub.   No murmur heard. Pulmonary/Chest: Effort normal and breath sounds normal. No respiratory distress. She has no wheezes. She has no rales. She exhibits no tenderness.  Abdominal: Soft. Bowel sounds are normal. She exhibits no distension and no mass. There is no tenderness. There is no rebound and no guarding.  Musculoskeletal: Normal range of  motion. She exhibits no edema or tenderness.  Lymphadenopathy:    She has  no cervical adenopathy.  Neurological: She is alert and oriented to person, place, and time. She has normal reflexes. No cranial nerve deficit. She exhibits normal muscle tone. Coordination normal.  Skin: Skin is warm and dry. No rash noted. She is not diaphoretic. No erythema. No pallor.  Psychiatric: She has a normal mood and affect. Her behavior is normal. Judgment and thought content normal.  Vitals reviewed.   Filed Vitals:   09/18/15 1033  BP: 110/62  Pulse: 65  Temp: 98.1 F (36.7 C)  TempSrc: Oral  Height: '5\' 7"'$  (1.702 m)  Weight: 226 lb (102.513 kg)  SpO2: 99%        Assessment:       ICD-9-CM ICD-10-CM   1. Chronic cough 786.2 R05   2. ILD (interstitial lung disease) ([place]) 515 J84.9   3. Irritable larynx syndrome 478.79 J38.7 Ambulatory referral to ENT       Plan:      Improved but vocal cord issues seems to predominate despite gabapentin treatment Glad swallow study in dec 2016 turned out okay  Plan  - refer back to ENT   - too badd insurance will not let you see Dr Joya Gaskins in E[place]  - so, re-establish with ENT in [facility]  Followup Nov 2017 or sooner if needed      Dr. Brand Males, M.D., [facility].C.P Pulmonary and Critical Care Medicine Staff Physician [place] [facility] Pager: [phone], If no answer or between  15:00h - 7:00h: call [phone]  09/18/2015 11:21 AM

## 2015-12-27 ENCOUNTER — Other Ambulatory Visit: Payer: Self-pay | Admitting: Internal Medicine

## 2015-12-27 DIAGNOSIS — Z1231 Encounter for screening mammogram for malignant neoplasm of breast: Secondary | ICD-10-CM

## 2016-01-06 ENCOUNTER — Encounter (HOSPITAL_COMMUNITY): Payer: Self-pay | Admitting: Emergency Medicine

## 2016-01-06 ENCOUNTER — Ambulatory Visit
Admission: RE | Admit: 2016-01-06 | Discharge: 2016-01-06 | Disposition: A | Discharge status: Home / Self Care | Payer: PPO | Source: Ambulatory Visit | Attending: Internal Medicine | Admitting: Internal Medicine

## 2016-01-06 ENCOUNTER — Emergency Department (HOSPITAL_COMMUNITY)
Admission: EM | Admit: 2016-01-06 | Discharge: 2016-01-06 | Disposition: A | Discharge status: Home / Self Care | Payer: PPO | Attending: Emergency Medicine | Admitting: Emergency Medicine

## 2016-01-06 DIAGNOSIS — Z87891 Personal history of nicotine dependence: Secondary | ICD-10-CM | POA: Insufficient documentation

## 2016-01-06 DIAGNOSIS — R21 Rash and other nonspecific skin eruption: Secondary | ICD-10-CM | POA: Diagnosis not present

## 2016-01-06 DIAGNOSIS — I10 Essential (primary) hypertension: Secondary | ICD-10-CM | POA: Insufficient documentation

## 2016-01-06 DIAGNOSIS — Z7984 Long term (current) use of oral hypoglycemic drugs: Secondary | ICD-10-CM | POA: Diagnosis not present

## 2016-01-06 DIAGNOSIS — Z794 Long term (current) use of insulin: Secondary | ICD-10-CM | POA: Insufficient documentation

## 2016-01-06 DIAGNOSIS — E119 Type 2 diabetes mellitus without complications: Secondary | ICD-10-CM | POA: Insufficient documentation

## 2016-01-06 DIAGNOSIS — Z7982 Long term (current) use of aspirin: Secondary | ICD-10-CM | POA: Insufficient documentation

## 2016-01-06 DIAGNOSIS — J45909 Unspecified asthma, uncomplicated: Secondary | ICD-10-CM | POA: Insufficient documentation

## 2016-01-06 DIAGNOSIS — B029 Zoster without complications: Secondary | ICD-10-CM | POA: Diagnosis not present

## 2016-01-06 DIAGNOSIS — Z1231 Encounter for screening mammogram for malignant neoplasm of breast: Secondary | ICD-10-CM

## 2016-01-06 DIAGNOSIS — J449 Chronic obstructive pulmonary disease, unspecified: Secondary | ICD-10-CM | POA: Insufficient documentation

## 2016-01-06 DIAGNOSIS — Z79899 Other long term (current) drug therapy: Secondary | ICD-10-CM | POA: Diagnosis not present

## 2016-01-06 MED ORDER — PREDNISONE 20 MG PO TABS: ORAL_TABLET | ORAL | Status: DC

## 2016-01-06 MED ORDER — OXYCODONE-ACETAMINOPHEN 5-325 MG PO TABS: 1.0000 | ORAL_TABLET | ORAL | Status: DC | PRN

## 2016-01-06 MED ORDER — ACYCLOVIR 800 MG PO TABS: 800.0000 mg | ORAL_TABLET | Freq: Every day | ORAL | Status: DC

## 2016-01-06 NOTE — Discharge Instructions (Signed)
Take the prescribed medication as directed.  These will help reduce the rash and symptoms of shingles.  Use caution when taking percocet, can make you sleepy/drowsy. Follow-up with your primary care doctor. Return to the ED for new or worsening symptoms.  Shingles Shingles, which is also known as herpes zoster, is an infection that causes a painful skin rash and fluid-filled blisters. Shingles is not related to genital herpes, which is a sexually transmitted infection.   Shingles only develops in people who:  Have had chickenpox.  Have received the chickenpox vaccine. (This is rare.) CAUSES Shingles is caused by varicella-zoster virus (VZV). This is the same virus that causes chickenpox. After exposure to VZV, the virus stays in the body in an inactive (dormant) state. Shingles develops if the virus reactivates. This can happen many years after the initial exposure to VZV. It is not known what causes this virus to reactivate. RISK FACTORS People who have had chickenpox or received the chickenpox vaccine are at risk for shingles. Infection is more common in people who:  Are older than age 60.  Have a weakened defense (immune) system, such as those with HIV, AIDS, or cancer.  Are taking medicines that weaken the immune system, such as transplant medicines.  Are under great stress. SYMPTOMS Early symptoms of this condition include itching, tingling, and pain in an area on your skin. Pain may be described as burning, stabbing, or throbbing. A few days or weeks after symptoms start, a painful red rash appears, usually on one side of the body in a bandlike or beltlike pattern. The rash eventually turns into fluid-filled blisters that break open, scab over, and dry up in about 2-3 weeks. At any time during the infection, you may also develop:  A fever.  Chills.  A headache.  An upset stomach. DIAGNOSIS This condition is diagnosed with a skin exam. Sometimes, skin or fluid samples are  taken from the blisters before a diagnosis is made. These samples are examined under a microscope or sent to a lab for testing. TREATMENT There is no specific cure for this condition. Your health care provider will probably prescribe medicines to help you manage pain, recover more quickly, and avoid long-term problems. Medicines may include:  Antiviral drugs.  Anti-inflammatory drugs.  Pain medicines. If the area involved is on your face, you may be referred to a specialist, such as an eye doctor (ophthalmologist) or an ear, nose, and throat (ENT) doctor to help you avoid eye problems, chronic pain, or disability. HOME CARE INSTRUCTIONS Medicines  Take medicines only as directed by your health care provider.  Apply an anti-itch or numbing cream to the affected area as directed by your health care provider. Blister and Rash Care  Take a cool bath or apply cool compresses to the area of the rash or blisters as directed by your health care provider. This may help with pain and itching.  Keep your rash covered with a loose bandage (dressing). Wear loose-fitting clothing to help ease the pain of material rubbing against the rash.  Keep your rash and blisters clean with mild soap and cool water or as directed by your health care provider.  Check your rash every day for signs of infection. These include redness, swelling, and pain that lasts or increases.  Do not pick your blisters.  Do not scratch your rash. General Instructions  Rest as directed by your health care provider.  Keep all follow-up visits as directed by your health care provider. This  is important.  Until your blisters scab over, your infection can cause chickenpox in people who have never had it or been vaccinated against it. To prevent this from happening, avoid contact with other people, especially:  Babies.  Pregnant women.  Children who have eczema.  Elderly people who have transplants.  People who have  chronic illnesses, such as leukemia or AIDS. SEEK MEDICAL CARE IF:  Your pain is not relieved with prescribed medicines.  Your pain does not get better after the rash heals.  Your rash looks infected. Signs of infection include redness, swelling, and pain that lasts or increases. SEEK IMMEDIATE MEDICAL CARE IF:  The rash is on your face or nose.  You have facial pain, pain around your eye area, or loss of feeling on one side of your face.  You have ear pain or you have ringing in your ear.  You have loss of taste.  Your condition gets worse.   This information is not intended to replace advice given to you by your health care provider. Make sure you discuss any questions you have with your health care provider.   Document Released: 06/29/2005 Document Revised: 07/20/2014 Document Reviewed: 05/10/2014 Elsevier Interactive Patient Education [employer]2016 Elsevier Inc.

## 2016-01-06 NOTE — ED Notes (Signed)
Declined W/C at D/C and was escorted to lobby by RN. 

## 2016-01-06 NOTE — ED Notes (Signed)
Blister like rash to back of neck started yesterday

## 2016-01-06 NOTE — ED Provider Notes (Signed)
CSN: [id number]     Arrival date & time 01/06/16  [id number] History  By signing my name below, I, Emmanuella Mensah, attest that this documentation has been prepared under the direction and in the presence of Quincy Carnes, PA-C. Electronically Signed: Judithann Sauger, ED Scribe. 01/06/2016. 10:05 AM.    Chief Complaint  Patient presents with  . Rash   The history is provided by the patient. No language interpreter was used.   HPI Comments: [name] is a 60 y.o. female with a hx of DM and HTN who presents to the Emergency Department complaining of gradually worsening moderately burning blistering rash to the posterior neck onset yesterday. Pt notes that the rash began as a burning pain that developed into a rash and it is spreading down her neck towards her right shoulder. She reports associated neck stiffness. She denies any new lotions or soaps. No alleviating factors noted. Pt has not tried any medications PTA. She reports a hx of childhood chicken pox. No fever or SOB.    Past Medical History  Diagnosis Date  . HTN (hypertension)   . Obesity   . Asthma   . Sciatica   . Rotator cuff tear   . Anxiety   . COPD (chronic obstructive pulmonary disease) ([place])   . Sleep apnea     CPAP, sleep study on [place]  . Headache(784.0)   . Environmental allergies     "year round"  . High cholesterol   . Cold extremity without peripheral vascular disease     bilaterally; "from my knees down into my feet; they get ice cold"  . Heart murmur   . Chronic bronchitis ([place])   . Vocal cord dysfunction     "I've had it for many years"  . Exertional dyspnea   . Type II diabetes mellitus ([place])   . GERD (gastroesophageal reflux disease)   . Arthritis     "both shoulders"  . Sciatic nerve pain     "tailbone down into my legs when it flares up"  . Blood transfusion without reported diagnosis   . Hiatal hernia    Past Surgical History  Procedure Laterality Date  . Nasal sinus surgery  1992  . Toe  surgery      for ingrown [id number] ?left  . Shoulder arthroscopy w/ rotator cuff repair  01/28/12    left  . Appendectomy  1976  . Oophorectomy  1976    w/ovarian cyst excision and appy  . Total abdominal hysterectomy  1985  . Abdominal adhesion surgery  ~ 1978   Family History  Problem Relation Age of Onset  . Heart disease Mother   . Asthma Mother   . Heart failure Maternal Grandmother   . Asthma Son   . Asthma Daughter   . Lupus Daughter   . Colon cancer Neg Hx   . Throat cancer Neg Hx   . Stomach cancer Neg Hx   . Esophageal cancer Neg Hx   . Rectal cancer Neg Hx   . Diabetes      "everybody on both sides"   Social History  Substance Use Topics  . Smoking status: Former Smoker -- 1.00 packs/day for 25 years    Types: Cigarettes    Quit date: 07/13/1996  . Smokeless tobacco: Never Used  . Alcohol Use: No     Comment: 01/29/12 "used to abuse alcohol; last drink was in the 1980's"   OB History    No data available  Review of Systems  Constitutional: Negative for fever.  Respiratory: Negative for shortness of breath.   Skin: Positive for rash.  All other systems reviewed and are negative.     Allergies  Lisinopril  Home Medications   Prior to Admission medications   Medication Sig Start Date End Date Taking? Authorizing Provider  albuterol (PROVENTIL HFA;VENTOLIN HFA) 108 (90 BASE) MCG/ACT inhaler Inhale 2 puffs into the lungs every 4 (four) hours as needed for wheezing or shortness of breath.     Historical Provider, MD  ALPRAZolam Duanne Moron) 0.5 MG tablet Take 0.5 mg by mouth 2 (two) times daily as needed.     Historical Provider, MD  aspirin 81 MG tablet Take 81 mg by mouth 2 (two) times daily.     Historical Provider, MD  atorvastatin (LIPITOR) 20 MG tablet Take 20 mg by mouth every evening.     Historical Provider, MD  Azelastine-Fluticasone (DYMISTA) 137-50 MCG/ACT SUSP Place into the nose daily.    Historical Provider, MD  BELSOMRA 15 MG TABS Take  1 tablet by mouth daily. 07/16/15   Deneise Lever, MD  chlorpheniramine (CHLOR-TRIMETON) 4 MG tablet 2 tabs by mouth at bedtime    Historical Provider, MD  citalopram (CELEXA) 10 MG tablet Take 1 tablet by mouth daily. 05/08/13   Historical Provider, MD  cyclobenzaprine (FLEXERIL) 10 MG tablet Take 1 tablet by mouth as needed.    Historical Provider, MD  gabapentin (NEURONTIN) 300 MG capsule Take 1 capsule by mouth 3 (three) times daily.  05/08/13   Historical Provider, MD  HUMULIN N KWIKPEN 100 UNIT/ML Kiwkpen Inject 30 Units into the skin 2 (two) times daily.    Historical Provider, MD  Linaclotide Rolan Lipa) 290 MCG CAPS capsule Take 1 capsule (290 mcg total) by mouth daily. 08/22/14   Jerene Bears, MD  loratadine (CLARITIN) 10 MG tablet Take 10 mg by mouth every morning.    Historical Provider, MD  losartan (COZAAR) 100 MG tablet Take 1 tablet by mouth daily.    Historical Provider, MD  metFORMIN (GLUCOPHAGE) 1000 MG tablet Take 1 tablet by mouth 2 (two) times daily. 04/15/13   Historical Provider, MD  metoCLOPramide (REGLAN) 5 MG/5ML solution Take 5 mLs (5 mg total) by mouth 4 (four) times daily -  before meals and at bedtime. 09/13/14   Lori P Hvozdovic, PA-C  pantoprazole (PROTONIX) 40 MG tablet Take 1 tablet (40 mg total) by mouth 2 (two) times daily. 06/17/15   Lori P Hvozdovic, PA-C  traMADol (ULTRAM) 50 MG tablet Take 50 mg by mouth every 6 (six) hours as needed for pain.     Historical Provider, MD  VICTOZA 18 MG/3ML SOPN Inject 18 mg into the skin every morning.  03/14/13   Historical Provider, MD   BP 152/81 mmHg  Pulse 70  Temp(Src) 97.6 F (36.4 C) (Oral)  Resp 20  SpO2 99%   Physical Exam  Constitutional: She is oriented to person, place, and time. She appears well-developed and well-nourished.  HENT:  Head: Normocephalic and atraumatic.  Mouth/Throat: Oropharynx is clear and moist.  No oral lesions, airway clear  Eyes: Conjunctivae and EOM are normal. Pupils are equal, round,  and reactive to light.  Neck: Normal range of motion.  Cardiovascular: Normal rate, regular rhythm and normal heart sounds.   Pulmonary/Chest: Effort normal and breath sounds normal. No respiratory distress. She has no wheezes.  Abdominal: Soft. Bowel sounds are normal.  Musculoskeletal: Normal range of motion.  Blister like  rash to midline nape of neck extending to right shoulder; endorses burning type pain; area is locally tender to palpation; no drainage or superimposed infection noted; no lesions on palms/soles  Neurological: She is alert and oriented to person, place, and time.  Skin: Skin is warm and dry.  Psychiatric: She has a normal mood and affect.  Nursing note and vitals reviewed.   ED Course  Procedures (including critical care time) DIAGNOSTIC STUDIES: Oxygen Saturation is 99% on RA, normal by my interpretation.    COORDINATION OF CARE: 10:04 AM- Pt advised of plan for treatment and pt agrees. Pt will receive Acyclovir and pain medication. Advised pt to follow up with PCP in several days after treatment for re-evaluation.   MDM   Final diagnoses:  Shingles   60 year old female here with blisterlike rash to the back of her neck. Patient is afebrile, nontoxic. This begins along midline nape of her neck and extends down to the right shoulder. There are blisters in vesicles noted. There is no drainage or superimposed infection. Patient does admit to having chickenpox as a child. Concern for shingles. Patient is not immune compromised.  Will start on acyclovir, prednisone, and Percocet. Follow-up with PCP.  Discussed plan with patient, he/she acknowledged understanding and agreed with plan of care.  Return precautions given for new or worsening symptoms.  I personally performed the services described in this documentation, which was scribed in my presence. The recorded information has been reviewed and is accurate.  Larene Pickett, PA-C 01/06/16 1035  Elnora Morrison,  MD 01/06/16 [id number]

## 2016-01-26 ENCOUNTER — Emergency Department (HOSPITAL_COMMUNITY)
Admission: EM | Admit: 2016-01-26 | Discharge: 2016-01-26 | Disposition: A | Discharge status: Home / Self Care | Payer: PPO | Attending: Emergency Medicine | Admitting: Emergency Medicine

## 2016-01-26 ENCOUNTER — Encounter (HOSPITAL_COMMUNITY): Payer: Self-pay

## 2016-01-26 DIAGNOSIS — Z7982 Long term (current) use of aspirin: Secondary | ICD-10-CM | POA: Diagnosis not present

## 2016-01-26 DIAGNOSIS — E119 Type 2 diabetes mellitus without complications: Secondary | ICD-10-CM | POA: Diagnosis not present

## 2016-01-26 DIAGNOSIS — Z87891 Personal history of nicotine dependence: Secondary | ICD-10-CM | POA: Diagnosis not present

## 2016-01-26 DIAGNOSIS — J449 Chronic obstructive pulmonary disease, unspecified: Secondary | ICD-10-CM | POA: Insufficient documentation

## 2016-01-26 DIAGNOSIS — Z7984 Long term (current) use of oral hypoglycemic drugs: Secondary | ICD-10-CM | POA: Insufficient documentation

## 2016-01-26 DIAGNOSIS — R51 Headache: Secondary | ICD-10-CM | POA: Insufficient documentation

## 2016-01-26 DIAGNOSIS — Z79899 Other long term (current) drug therapy: Secondary | ICD-10-CM | POA: Diagnosis not present

## 2016-01-26 DIAGNOSIS — Z794 Long term (current) use of insulin: Secondary | ICD-10-CM | POA: Diagnosis not present

## 2016-01-26 DIAGNOSIS — I10 Essential (primary) hypertension: Secondary | ICD-10-CM | POA: Diagnosis not present

## 2016-01-26 DIAGNOSIS — M79605 Pain in left leg: Secondary | ICD-10-CM | POA: Diagnosis not present

## 2016-01-26 LAB — BASIC METABOLIC PANEL
Anion gap: 8 (ref 5–15)
BUN: 9 mg/dL (ref 6–20)
CO2: 23 mmol/L (ref 22–32)
Calcium: 8.9 mg/dL (ref 8.9–10.3)
Chloride: 106 mmol/L (ref 101–111)
Creatinine, Ser: 0.79 mg/dL (ref 0.44–1.00)
GFR calc Af Amer: >60 mL/min (ref >60)
GFR calc non Af Amer: >60 mL/min (ref >60)
Glucose, Bld: 190 mg/dL — ABNORMAL HIGH (ref 65–99)
Potassium: 3.9 mmol/L (ref 3.5–5.1)
Sodium: 137 mmol/L (ref 135–145)

## 2016-01-26 LAB — CBC
HCT: 38 % (ref 36.0–46.0)
Hemoglobin: 11.9 g/dL — ABNORMAL LOW (ref 12.0–15.0)
MCH: 26.6 pg (ref 26.0–34.0)
MCHC: 31.3 g/dL (ref 30.0–36.0)
MCV: 85 fL (ref 78.0–100.0)
Platelets: 220 10*3/uL (ref 150–400)
RBC: 4.47 MIL/uL (ref 3.87–5.11)
RDW: 15 % (ref 11.5–15.5)
WBC: 6.6 10*3/uL (ref 4.0–10.5)

## 2016-01-26 NOTE — ED Notes (Signed)
Pt reports she has had left leg pain for several days, decreased movement due to pain. Pt also reports headache that started today, no neuro deficits. A&o X4.

## 2016-01-26 NOTE — ED Provider Notes (Signed)
CSN: [id number]     Arrival date & time 01/26/16  1737 History   First MD Initiated Contact with Patient 01/26/16 2221     Chief Complaint  Patient presents with  . Leg Pain  . Headache     (Consider location/radiation/quality/duration/timing/severity/associated sxs/prior Treatment) HPI [name] is a 60 y.o. female with left leg swelling. Patient reports she has had increased swelling behind her left knee since yesterday. She reports associated pain that started in her lower leg and has now migrated into her upper medial thigh. She characterizes this sensation as "a charley horse". Worse with walking, better with rest. She denies any fevers, chills, chest pain or shortness of breath, history of DVT, recent travel or surgeries. No numbness or weakness. No other modifying factors.  Past Medical History  Diagnosis Date  . HTN (hypertension)   . Obesity   . Asthma   . Sciatica   . Rotator cuff tear   . Anxiety   . COPD (chronic obstructive pulmonary disease) ([place])   . Sleep apnea     CPAP, sleep study on [place]  . Headache(784.0)   . Environmental allergies     "year round"  . High cholesterol   . Cold extremity without peripheral vascular disease     bilaterally; "from my knees down into my feet; they get ice cold"  . Heart murmur   . Chronic bronchitis ([place])   . Vocal cord dysfunction     "I've had it for many years"  . Exertional dyspnea   . Type II diabetes mellitus ([place])   . GERD (gastroesophageal reflux disease)   . Arthritis     "both shoulders"  . Sciatic nerve pain     "tailbone down into my legs when it flares up"  . Blood transfusion without reported diagnosis   . Hiatal hernia    Past Surgical History  Procedure Laterality Date  . Nasal sinus surgery  1992  . Toe surgery      for ingrown [id number] ?left  . Shoulder arthroscopy w/ rotator cuff repair  01/28/12    left  . Appendectomy  1976  . Oophorectomy  1976    w/ovarian cyst excision and appy  .  Total abdominal hysterectomy  1985  . Abdominal adhesion surgery  ~ 1978   Family History  Problem Relation Age of Onset  . Heart disease Mother   . Asthma Mother   . Heart failure Maternal Grandmother   . Asthma Son   . Asthma Daughter   . Lupus Daughter   . Colon cancer Neg Hx   . Throat cancer Neg Hx   . Stomach cancer Neg Hx   . Esophageal cancer Neg Hx   . Rectal cancer Neg Hx   . Diabetes      "everybody on both sides"   Social History  Substance Use Topics  . Smoking status: Former Smoker -- 1.00 packs/day for 25 years    Types: Cigarettes    Quit date: 07/13/1996  . Smokeless tobacco: Never Used  . Alcohol Use: No     Comment: 01/29/12 "used to abuse alcohol; last drink was in the 1980's"   OB History    No data available     Review of Systems A 10 point review of systems was completed and was negative except for pertinent positives and negatives as mentioned in the history of present illness     Allergies  Lisinopril  Home Medications   Prior to  Admission medications   Medication Sig Start Date End Date Taking? Authorizing Provider  acyclovir (ZOVIRAX) 800 MG tablet Take 1 tablet (800 mg total) by mouth 5 (five) times daily. 01/06/16   Larene Pickett, PA-C  albuterol (PROVENTIL HFA;VENTOLIN HFA) 108 (90 BASE) MCG/ACT inhaler Inhale 2 puffs into the lungs every 4 (four) hours as needed for wheezing or shortness of breath.     Historical Provider, MD  ALPRAZolam Duanne Moron) 0.5 MG tablet Take 0.5 mg by mouth 2 (two) times daily as needed.     Historical Provider, MD  aspirin 81 MG tablet Take 81 mg by mouth 2 (two) times daily.     Historical Provider, MD  atorvastatin (LIPITOR) 20 MG tablet Take 20 mg by mouth every evening.     Historical Provider, MD  Azelastine-Fluticasone (DYMISTA) 137-50 MCG/ACT SUSP Place into the nose daily.    Historical Provider, MD  BELSOMRA 15 MG TABS Take 1 tablet by mouth daily. 07/16/15   Deneise Lever, MD  chlorpheniramine  (CHLOR-TRIMETON) 4 MG tablet 2 tabs by mouth at bedtime    Historical Provider, MD  citalopram (CELEXA) 10 MG tablet Take 1 tablet by mouth daily. 05/08/13   Historical Provider, MD  cyclobenzaprine (FLEXERIL) 10 MG tablet Take 1 tablet by mouth as needed.    Historical Provider, MD  gabapentin (NEURONTIN) 300 MG capsule Take 1 capsule by mouth 3 (three) times daily.  05/08/13   Historical Provider, MD  HUMULIN N KWIKPEN 100 UNIT/ML Kiwkpen Inject 30 Units into the skin 2 (two) times daily.    Historical Provider, MD  Linaclotide Rolan Lipa) 290 MCG CAPS capsule Take 1 capsule (290 mcg total) by mouth daily. 08/22/14   Jerene Bears, MD  loratadine (CLARITIN) 10 MG tablet Take 10 mg by mouth every morning.    Historical Provider, MD  losartan (COZAAR) 100 MG tablet Take 1 tablet by mouth daily.    Historical Provider, MD  metFORMIN (GLUCOPHAGE) 1000 MG tablet Take 1 tablet by mouth 2 (two) times daily. 04/15/13   Historical Provider, MD  metoCLOPramide (REGLAN) 5 MG/5ML solution Take 5 mLs (5 mg total) by mouth 4 (four) times daily -  before meals and at bedtime. 09/13/14   Lori P Hvozdovic, PA-C  oxyCODONE-acetaminophen (PERCOCET/ROXICET) 5-325 MG tablet Take 1 tablet by mouth every 4 (four) hours as needed. 01/06/16   Larene Pickett, PA-C  pantoprazole (PROTONIX) 40 MG tablet Take 1 tablet (40 mg total) by mouth 2 (two) times daily. 06/17/15   Lori P Hvozdovic, PA-C  predniSONE (DELTASONE) 20 MG tablet Take 40 mg by mouth daily for 3 days, then 55m by mouth daily for 3 days, then 133mdaily for 3 days 01/06/16   LiLarene PickettPA-C  traMADol (ULTRAM) 50 MG tablet Take 50 mg by mouth every 6 (six) hours as needed for pain.     Historical Provider, MD  VICTOZA 18 MG/3ML SOPN Inject 18 mg into the skin every morning.  03/14/13   Historical Provider, MD   BP 132/62 mmHg  Pulse 67  Temp(Src) 98.6 F (37 C) (Oral)  Resp 14  SpO2 99% Physical Exam  Constitutional: She is oriented to person, place, and time.  She appears well-developed and well-nourished.  HENT:  Head: Normocephalic and atraumatic.  Mouth/Throat: Oropharynx is clear and moist.  Eyes: Conjunctivae are normal. Pupils are equal, round, and reactive to light. Right eye exhibits no discharge. Left eye exhibits no discharge. No scleral icterus.  Neck: Neck  supple.  Cardiovascular: Normal rate, regular rhythm and normal heart sounds.   Pulmonary/Chest: Effort normal and breath sounds normal. No respiratory distress. She has no wheezes. She has no rales.  Abdominal: Soft. There is no tenderness.  Musculoskeletal: She exhibits tenderness.  Tenderness diffusely throughout medial/distal left thigh. No appreciable unilateral leg swelling, erythema. Muscle compartments are soft. Distal pulses are intact with brisk cap refill. Skin is appropriately warm. Full active range of motion  Neurological: She is alert and oriented to person, place, and time.  Cranial Nerves II-XII grossly intact  Skin: Skin is warm and dry. No rash noted.  Psychiatric: She has a normal mood and affect.  Nursing note and vitals reviewed.   ED Course  Procedures (including critical care time) Labs Review Labs Reviewed  BASIC METABOLIC PANEL - Abnormal; Notable for the following:    Glucose, Bld 190 (*)    All other components within normal limits  CBC - Abnormal; Notable for the following:    Hemoglobin 11.9 (*)    All other components within normal limits    Imaging Review No results found. I have personally reviewed and evaluated these images and lab results as part of my medical decision-making.   EKG Interpretation None     Filed Vitals:   01/26/16 1752 01/26/16 2057 01/26/16 2151  BP: 152/65 132/62   Pulse: 82 67   Temp: 98.2 F (36.8 C) 98.6 F (37 C)   TempSrc: Oral Oral   Resp: 16 14   SpO2: 98% 99% 99%    MDM  Patient here for evaluation of left leg swelling and pain. No cardiopulmonary complaints. She has a low wells DVT score. She  overall appears well. We'll have her return tomorrow for ultrasound of lower extremity to rule out of DVT.  Prior to patient discharge, I discussed and reviewed this case with Dr. Rogene Houston  Final diagnoses:  Left leg pain        Comer Locket, PA-C 01/26/16 [id number]  Fredia Sorrow, MD 01/27/16 [id number]

## 2016-01-26 NOTE — Discharge Instructions (Signed)
IMPORTANT PATIENT INSTRUCTIONS:  ° °You have been scheduled for an Outpatient Vascular Study at [facility].   ° °If tomorrow is a Saturday or Sunday, please go to the [facility] registration desk at 8 AM tomorrow morning and tell them you are therefore a vascular study. ° °If tomorrow is a weekday (Monday - Friday), please go to the [facility]mitting Department at 8 AM and tell them you are therefore a vascular study ° ° °

## 2016-01-27 ENCOUNTER — Other Ambulatory Visit (HOSPITAL_COMMUNITY): Payer: Self-pay | Admitting: Emergency Medicine

## 2016-01-27 ENCOUNTER — Ambulatory Visit (HOSPITAL_COMMUNITY)
Admission: RE | Admit: 2016-01-27 | Discharge: 2016-01-27 | Disposition: A | Discharge status: Home / Self Care | Payer: PPO | Source: Ambulatory Visit | Attending: Emergency Medicine | Admitting: Emergency Medicine

## 2016-01-27 DIAGNOSIS — J449 Chronic obstructive pulmonary disease, unspecified: Secondary | ICD-10-CM | POA: Insufficient documentation

## 2016-01-27 DIAGNOSIS — K219 Gastro-esophageal reflux disease without esophagitis: Secondary | ICD-10-CM | POA: Insufficient documentation

## 2016-01-27 DIAGNOSIS — R52 Pain, unspecified: Secondary | ICD-10-CM | POA: Insufficient documentation

## 2016-01-27 DIAGNOSIS — G4730 Sleep apnea, unspecified: Secondary | ICD-10-CM | POA: Insufficient documentation

## 2016-01-27 DIAGNOSIS — F419 Anxiety disorder, unspecified: Secondary | ICD-10-CM | POA: Insufficient documentation

## 2016-01-27 DIAGNOSIS — E119 Type 2 diabetes mellitus without complications: Secondary | ICD-10-CM | POA: Insufficient documentation

## 2016-01-27 DIAGNOSIS — I10 Essential (primary) hypertension: Secondary | ICD-10-CM | POA: Diagnosis not present

## 2016-01-27 DIAGNOSIS — E669 Obesity, unspecified: Secondary | ICD-10-CM | POA: Diagnosis not present

## 2016-01-27 DIAGNOSIS — E7800 Pure hypercholesterolemia, unspecified: Secondary | ICD-10-CM | POA: Insufficient documentation

## 2016-01-27 NOTE — Progress Notes (Signed)
*  Preliminary Results* Left lower extremity venous duplex completed. Left lower extremity is negative for deep vein thrombosis. There is no evidence of left Baker's cyst.  01/27/2016 8:30 AM  Gertie FeyMichelle Almas Rake, RVT, RDCS, RDMS

## 2016-02-07 NOTE — Progress Notes (Signed)
Routing to Dr.Husain

## 2016-06-09 ENCOUNTER — Ambulatory Visit (INDEPENDENT_AMBULATORY_CARE_PROVIDER_SITE_OTHER)
Admission: RE | Admit: 2016-06-09 | Discharge: 2016-06-09 | Disposition: A | Discharge status: Home / Self Care | Payer: PPO | Source: Ambulatory Visit | Attending: Internal Medicine | Admitting: Internal Medicine

## 2016-06-09 DIAGNOSIS — J849 Interstitial pulmonary disease, unspecified: Secondary | ICD-10-CM

## 2016-06-15 ENCOUNTER — Encounter: Payer: Self-pay | Admitting: Internal Medicine

## 2016-06-15 ENCOUNTER — Ambulatory Visit (INDEPENDENT_AMBULATORY_CARE_PROVIDER_SITE_OTHER): Payer: PPO | Admitting: Internal Medicine

## 2016-06-15 VITALS — BP 122/62 | HR 75 | Ht 67.0 in | Wt 223.0 lb

## 2016-06-15 DIAGNOSIS — R05 Cough: Secondary | ICD-10-CM

## 2016-06-15 DIAGNOSIS — R053 Chronic cough: Secondary | ICD-10-CM

## 2016-06-15 DIAGNOSIS — J849 Interstitial pulmonary disease, unspecified: Secondary | ICD-10-CM | POA: Diagnosis not present

## 2016-06-15 DIAGNOSIS — R0600 Dyspnea, unspecified: Secondary | ICD-10-CM

## 2016-06-15 NOTE — Patient Instructions (Signed)
Dyspnea, unspecified type - do cpst bike test  Chronic cough - I think this is cause for most symptoms  - you have vocal cord dysfunction  - we will retgroup after bike stress test  ILD (interstitial lung disease) ([facility]) - stable on ct snce 2014. Do not think this is cause of symptoms - though cause not known biopsy can be risky esp if is stavle since 2014  followup  - after bike stress test

## 2016-06-15 NOTE — Progress Notes (Signed)
Subjective:     Patient ID: [name], female   DOB: 1956-05-17, 60 y.o.   MRN: [id number]  HPI    OV 02/17/2011. 60 year old female. Obese. AA female. Remote smoker On cpap for OSA (pmd managing). REferred by Dr Delfina Redwood.    New visit for chronic cough. Preesent for many years. Insidious onset.  Progressively with more frequency past few years. Cough rated as moderate. Usually dry cough with associated barking quality. Occ mucus present; thick and clear. Cough worsened by milk products, change in weather and dust. Hx of prior ENT eval in [place] > 15 years ago: diagnosed as "vocal cords sticking together" and remote hx of sinus surgery Also diagnosed with GERD in [place] > 15 years ago; takes protonix.  [facility] Reflux Symptom INdex score is 39 and very c/w LPR cough(severe hoarsness of voice, clearing of throat, ecess throat mucus, post nasal drip, coughing after lying down or eating, choking epidoses, sensation of globus with food sticking in thorat) and associated heart burn. Cough so severe that she could not complete spirometry in office today  Has dyspnea too: few years, insidious onset. Progressive. Rates it as moderate. Dyspnea brought on by associated chest tightness and nasal drainage or even exertion like climbing flight of steps. Dyspnea relieved by rest. Cough and dyspnea associated with wheezing and constant yellow sinus drainage as well. Frequent nocturnal awakenings with choking present. Above resulted in asthma diagnosis versus tracheobronchiolotis > 15 years ago: on advair since then which she is unsure is helping. She herself is not convinced she has asthma.   Associated med intake shows tramadol (for shoulder pain and sicatica), fish oil/ flaxseed (2 years) and ACE inhibitor intake (been on lisniopril for > 10 years due to diabetes)   Pas med hx review fromn outside record notes "hx of asthma and vocal cord dysfunction and post nasal drip. Recalls frequent ER visits atleast one  per month (last er visit 2 months ago per hx) to [facility]. Denies ICU admits, intubations for same.   REC Cough is from ACE Inhibitor, sinus drainage, vocal cord dysfunction, acid reflux and possible cough all conspiring to cause cyclical cough/LPR cough #Sinus drainage  - continue nasal steroid - refer to [facility] ENT Dr. Wilburn Cornelia or Dr Redmond Baseman #Possible Acid Reflux  - continue protonix   - take diet sheet from [place] - avoid colas, spices, cheeses, spirits, red meats, beer, chocolates, fried foods etc.,   - sleep with head end of bed elevated  - eat small frequent meals  - do not go to bed for 3 hours after last meal - stop fish oil and flaxseed for now #Asthma   - continue advair for now - might have to consider changing over to something else later depending on ENT evaluation #Vocal Cord Dysfunction - see ENT doctor first  -later  will consider referral to speech therapy with [name] #BP  - stop lisinopril - take benicar 1 tablet daily - take sample, nurse will add it in med list #Followup - I will see you in 4-6 weeks.  - Reattempt spirometry with [name] at time of followup -Important you comply with advice 100% correctly   OV 03/31/11: Followup cough. SAw ENT 89/17./12: no anatomic path or VCD noticed at time of exam though patient noted to have choked.  She states that she was not happy with her experience at ENT visit and does not want to visit the same office again. She is compliant  with instructions except for diet though she has made improvements there. Off lisniopril. Follows sinus, gerd and astham advice. Does not like advair diskus. Overall cough is much better. She is happy with improvement. RSI cough score dropped from 39 to 14.  She sses her hoarsenss, clearing and pos nasal drip as mild-moderate  PFTS show no more flow volume loop issues. There is restriction with mixed obstruction. DLCO 67%   REC Cough is from sinus drainage, vocal cord dysfunction,  acid reflux and possible cough all conspiring to cause cyclical cough/LPR cough  #Sinus drainage  - continue nasal steroid - nurse will do script for generic fluticasone -  #Acid Reflux  - continue protonix   - take diet sheet from [place] [facility] - avoid colas, spices, cheeses, spirits, red meats, beer, chocolates, fried foods etc.,   - sleep with head end of bed elevated  - eat small frequent meals  - do not go to bed for 3 hours after last meal - cotninue to avoid fish oil and flaxseed   #Asthma   - continue advair but nurse will give you sample of HFA and teach you how to take it with spacer; she will do script as well  #Vocal Cord Dysfunction - referral to speech therapy with [name]  #BP  - will add lisinopril to allergy list - take benicar 1 tablet daily or the equivalent Dr Delfina Redwood gives you  #Followup - I will see you in 8 weeks.  - -Important you comply with advice 100% correctly - Glad you are much better - Flu shot today please   OV 06/26/2011 Followup for chronic cough. She continues to do better. Subjectively feels much better compared to last visit. Although objectively her cough score has only reduced by 1 point. Today, RSI cough score is 13. Level III postnasal drip. Level to hoarseness of voice, cough after lying down, choking episodes, and annoying cough. Level I sensation of lump in throat and heartburn.  In talking to her she is compliant with her nasal steroid and her Protonix for acid reflux but not compliant with diet for acid reflux. She is on Advair HFA with spacer for presumed asthma and she feels this makes her cough less compared to Advair discus. We discussed more about her asthma history and she says that she was never sure she had asthma diagnosis in [place] it was a presumptive diagnosis. She is eager to come off Advair and give a trial without it. She also gives a history of allergies and allergy shots while living in [place] and she is open to an  allergy evaluation right now. In terms of cyclical cough she has finished speech therapy evaluation and graduated from the program. Overall she feels quality-of-life is acceptable although she would like to see cough improve further. So far we have not tried Neurontin    Past, Family, Social reviewed: no change since last visit except that Dr Deforest Hoyles is new PMD. She now states she has year round allergies. Recollects while in [place] used to take allergy shots twice a week 10 years ago. Since moving to [place] 4 years ago not seen an allergist. Laverle Hobby to see allergist. She feels she does not have asthma.  Cough is from sinus drainage, vocal cord dysfunction, acid reflux and possible asthma or allergiesh all working together to cause cyclical cough/LPR cough  #Sinus drainage  - continue nasal steroid - nurse will do script for generic fluticasone  -  #Acid Reflux  -  continue protonix  - contniue (REALLY IMPORANT IS DIET) diet sheet from [place] [facility] - avoid colas, spices, cheeses, spirits, red meats, beer, chocolates, fried foods etc.,  - sleep with head end of bed elevated  - eat small frequent meals  - do not go to bed for 3 hours after last meal  - cotninue to avoid fish oil and flaxseed  #Asthma  - since diagnosis of asthma was in doubt. Please stop Advair and just use albuterol as needed  -We'll do spirometry at followup  # allergies  - Please see Dr. Baird Lyons in our office for allergy evaluation; will do referral  #Vocal Cord Dysfunction  - congratulations on graduating from program with speech therapy [name]  #BP  - Continue losartan  #Followup  - I will see you in 8 weeks.  - -Important you comply with advice 100% correctly  - Glad you are much better  - Cough score worksheet at followup   OV 08/25/2011 Followup for chronic cough. She is off advair as advised. She is doing gerd control.  Ojectively her cough score is not reduced. Last visit score was 13 but now it is 14.  (Level III postnasal drip, and post nasal drip. . Level 2  hoarseness of voice and choking episodes. Level 1 - cough after lying down,and  sensation of lump in throat and heartburn). Howevefr, subjectively cough much improved. Still with post nasal drip. Has seen Dr Annamaria Boots for allergy 08/06/11 and 08/19/11. RAST serum profile negative except for elevated [place] IGE. Allergy skin test pending 09/16/11. On visit 08/19/11 to Dr Annamaria Boots had atypical chest pain - releived with ppi in office. EKG okay. Trop was normal. . Of note, Dr Annamaria Boots is also addressing her prior OSA issues.   ROS c/o anxiety, insomnia, also chest pains  - atypical  Spirometry  - fev1 1.74L/72%. Ratio 78    #Chest pains  - please see [facility] cardiology asap - > equivocal stress test and but clinically low probability February 2013; placed in clinical followup #Cough  - for sinus: follow Dr Annamaria Boots recommendation  - for acid reflux: follow medications and diet  - for possible asthma: stay off advair and once cardiology ok, please have methacholine challenge test  #Followup  - complete cardiology visit and methacholine challenge  - 4-6 weeks from now  09/16/11 with Dr Annamaria Boots Allergist- 30 yoF former smoker referred by Dr Chase Caller who has been seeing her for multifactorial cough, questioning an allergic component At last visit she had presented with chest pain, preventing planned allergy skin testing. Now following with cardiology and pending CT scan of her heart/Dr. Burt Knack. Still complains of nasal congestion. Able to use her CPAP more regularly when her nose is not stopped up. Unable to do methacholine challenge last month because her baseline is from a tree scores were too low. CPAP autotitration done/[facility]. Pending download. PFT- 09/11/11- FVC 2.15/68%, FEV1 1.59/ 64%, FEV1/FVC 0.80. FEF 25-75 % 0.37/ 15%   OV 03/21/2013 Followup chronic cough  - I have not seen her in nearly 15 months. She says that basically her cough resolved but  cough is no longer chronic and only occurs intermittently when she has "bronchitis". In the last year she's had antibiotics and prednisone for acute episodes of coughing at least 3 times. In between episodes she is asymptomatic. Currently she feels she's having one such episode in the past 3 weeks with chest congestion, cough with white mucus, fatigue and associated wheeze. Currently  RSI cough score is 21 and show severe cough.  - Of note, she has not had methacholine challenge test that I advised a year and half ago. She says that she does not have vocal cord dysfunction based on Dr. Mickie Hillier exam in August 2012 ENT physician   REC levaquin and pred burst  04/20/2013 Follow up and Med review  new med calendar - pt brought all meds with her today.  does report some sinus pressure, congestion, SOB, wheezing, chest tightness. Is feeling better but still gets into coughing fits that takes her a while to calm down. Strong odors seem to causes attacks along with laughing  She did have an attack in the office. Sats were normal however she has significant upper airway psuedowheezing w/ barking cough.  No fever , chest pain, over reflux , orthopnea or edema.  No using CPAP lately   REC top Advair . Add Chlor trimeton 92m 2 At bedtime   Change Loratadine 161min am.  Add Pepcid 2099mt bedtime   Avoid throat clearing , use sips of water.  Saline nasal rinses Twice daily  .  NO MINT PRODUCTS  GERD diet .  We are setting you up for a CT neck .  Restart CPAP At bedtime   follow up Dr. RamChase Caller 4 weeks.  Please contact office for sooner follow up if symptoms do not improve or worsen or seek emergency care    OV  05/26/2013 Chief Complaint  Patient presents with  . Follow-up    Pt c/o chest tightness, dry cough, wheezing and sob. Pt states this was some better while she was on prednisone. Last dose x 3 weeks ago.   Followup for chronic cough. She says her last 2-3 weeks that she's having  an acute bronchitis flare with yellow phlegm. She feels she needs antibiotics and prednisone. In fact in the time that she last saw me in September 2014 she's had another round of antibiotics and prednisone with her primary care physician Dr. HusDeforest Hoyleshe feels her chest is congested although in the office she is clearly having a barking cough, laryngeal spasm and upper airway noise intermittently with changes in voice. It is fairly significant. I reminded her that this is consistent with vocal cord dysfunction but she said that her ear nose throat surgeon cleared her for the same. Of note, she had a CT scan of the neck and this was normal.  07/26/2013 Follow up  2 month follow up - reports some  congestion, head congestion w/ light yellow mucus that is on/off. Has sinus drainage daily . Is not using claritin or chlortrimeton as recommended.  Was taken off ACE in past , now on Cozaar.   RSI cough score unchanged at 21  Had Esophagram on 1/8 w/ mild GERD/HH. No sign delay emptying .  No chest pain, orthopnea, edema , fever.  +hoarseness on/off.   She says she feels so much better with CPAP , using during daytime for naps and At bedtime  . Is amazed at how much she feels better.    OV 03/20/2014  Chief Complaint  Patient presents with  . Follow-up    Pt states she is unable to hold her voice as long when she sings. Pt c/o PND and prod cough, pt thinks its d/t allergies. Pt c/o chest soreness on right upper chest.    Followup chronic cough  - I have not seen the patient in nearly 10 months. Overall cough  was stable but recently she has had a flareup. I'm not so sure that she is followed instructions compliantly. It took me a while to understand the medication regimen but it appears that she takes dymista for  Sinuses but saline wash as needed, and not taking claritin, Advair as needed for her lungs, Protonix schedule followup acid reflux but Pepcid only as needed. She is only somewhat compliant with  the Neurontin. She continues to have sinus drainage, clearing of the throat and the laryngeal/barking cough that she feels is coming from the throat. This is associated with dyspnea and is of moderate severity, and some wheezing but she feels wheeze is upper airway  REC #Chronic cough  - I thnk cough is related to voice box issues and loss of neural control of the same - Please take prednisone 40 mg x1 day, then 30 mg x1 day, then 20 mg x1 day, then 10 mg x1 day, and then 5 mg x1 day and stop - refer Dr Ernestine Conrad of [facility] for chronic cough - for now continue current nasal, acid reflux and neurontin therapies - continue albuterol as needed ; for now hold off scheduled advair - repeat HRCT chest wo contrast for chronic cough  #FOllowup  3 months - after you see Dr Joya Gaskins of [facility]  Come sooner if there are problems o  OV 10/22/2014  Chief Complaint  Patient presents with  . Follow-up    Pt stated her cough has improved. Pt c/o hoarseness and chest tightness. Pt is wearing CPAP nightly and denies any issues with machine, mask or pressure.     FU chronic cough  - IN interim in Sept 2015 had CT chest : CT chest 03/26/14 shows worsening infiltreats in bases compaed to nov 2014. COncern for aspiration and referred  To GI. She does not know details of GI Visit but review of records shows she has gastroparesis and hs been started on reglan. Also she saw Dr Joya Gaskins of ENT in [place]. WIth above measure cough has resolved. /nearly resolved. No respiratory issues.   reports that she quit smoking about 18 years ago. Her smoking use included Cigarettes. She has a 25 pack-year smoking history. She has never used smokeless tobacco.   OV 11/12/2014  Chief Complaint  Patient presents with  . Follow-up    Pt here after PFT. Pt stated her breathing has slightly improved,she is less SOB. Pt stated her cough has also imrpoved. Pt has not yet had autoimmune panel.    Follow-up  chronic cough with interstitial lung disease findings  As of last visit her cough resolved. Cough was related to aspiration. As soon as she started following gastroenterology advice cough resolved. However follow-up CT scan of the chest in April 2016 compared to fall 2015 showed persistent ILD findings as described below [personally reviewed image]. At this point she is asymptomatic. I had her do pulmonary function test and autoimmune workup and reports for follow-up. Of these she only completed pulmonary function test that shows restriction with slight reduction in diffusion capacity [some of the restriction can be due to obesity]. She continues to be asymptomatic. She's not having reflux symptoms anymore. Of note, she forgot to have a autoimmune test. She does admit that her daughter has lupus   IMPRESSION: CT chest 10/29/2014 personally reviewed image  1. Stable findings of peribronchovascular ground-glass attenuation, predominantly in the basal portions of the lungs bilaterally, again favored to reflect nonspecific interstitial pneumonia (NSIP). 2. Mild  air trapping, indicative of mild small airways disease. Electronically Signed  By: Vinnie Langton M.D.  On: 10/29/2014 16:19  Pulmonary function test 10/31/2014 shows restriction with reduced diffusion capacity. FVC 2.1 L/69%, FEV1 1.8 L/75% and ratio of 86. No broncho-dilator response. Total lung capacity 3.5 L/63% and DLCO 20.0/70% Sleep Apnea FOLLOWS FOR: pt wearing cpap nightly, c/o mask getting hot sometimes qhs.  has trouble staying asleep.     OV 06/10/2015  Chief Complaint  Patient presents with  . Follow-up    Pt states she has been getting more frequent sinus infections, slight increase in SOB, chest heaviness. Pt here after HRCT. Pt wearing CPAP nightly.    Follow-up multifactorial chronic cough Follow-up mild basal interstitial lung disease not otherwise specified  She currently reports that she is not having much  cough although after I left the office I heard a cough and periodically she did have variable upper airway noise. She continues to have gastroparesis related acid reflux particularly when she eats food she regurgitates. She last saw GI March 2016 and was asked to increase and metoclopramide. She's not having much dyspnea. Overall she feels okay. She is having some atypical chest pain going on for a few months in the right supramammary area   07/16/2015-60 year old female former smoker followed by Dr. Chase Caller for mild ILD, recurrent sinus infections, chronic cough, Sleep Apnea FOLLOWS FOR: pt wearing cpap nightly, c/o mask getting hot sometimes qhs.  has trouble staying asleep.   Here for CPAP follow-up. "Loves it" and says she is wearing it all night every night. Fullface mask, [facility]/12.  Not noticing active respiratory problems currently with little cough    OV  09/18/2015  Chief Complaint  Patient presents with  . Follow-up    pt recently seen at [facility] ED for URI- pt c/o prod cough with thick clear mucus, SOB with exertion, wheezing.     Follow-up chronic cough which is multifactorial with a strong component of irritable larynx syndrome, vocal cord dysfunction and gastroparesis related to diabetes  Follow-up interstitial lung disease probably as a result of above but etiology uncertain   I last saw her in November 2016. She was supposed to follow-up with me in November 2017. In December 2016 I did make her see GI. They did a swallow study. I reviewed those results and she did not aspirate but coughed significantly during swallowing. No specific interventions have been recommended. It appears she has increase and metoclopramide. Then 09/01/2015 ended up in the ER with shortness of breath and wheezing episode and was given antibiotics and prednisone according to her history. Now she significantly improved and close to baseline. She does have some wheezing but she says it's all from the upper  airway. She does not want another course of anabiotic's of prednisone. She is on Neurontin but this is not helping. Her creatinine was 1 mg percent troponin was normal and hemoglobin was normal 09/01/2015. She had chest x-ray 09/01/2015 and is clear per report. I did not visualize this film.  In terms of her vocal cord dysfunction review of my prior notes shows that she is seen ENT multiple times. Most recently she saw [facility]NT Dr. Joya Gaskins and she had resolution but now the cough due to irritable larynx has come back. She says she cannot go back to [facility] because of insurance reasons. She is willing to reestablish with the ENT locally.   OV 06/15/2016  Chief Complaint  Patient presents with  . Follow-up  Pt states she is waking every 2 hours because she is SOB, pt is still wearing CPAP. Pt c/o prod cough with clear mucus. Pt states she just completed a pred taper and abx for URI.      Follow-up multifactorial chronic cough Follow-up mild basal interstitial lung disease not otherwise specified   Obese female with significant vocal cord dysfunction/irritable larynx syndrome returns for follow-up. Last seen March 2017. She had follow-up high-resolution CT chest 06/09/2016 that I personally visualized. The findings show extremely subtle presence of interstitial lung disease all stable since 2014. In terms of symptoms of cough she is overall stable. However she tells me she has significant episodes of dyspnea at night when she wears a CPAP. Here in the office when asked her to open her mouth she made it given the significant upper airway coughing spell. She also complains of dyspnea on exertion.   has a past medical history of Anxiety; Arthritis; Asthma; Blood transfusion without reported diagnosis; Chronic bronchitis (HCC); Cold extremity without peripheral vascular disease; COPD (chronic obstructive pulmonary disease) (HCC); Environmental allergies; Exertional dyspnea; GERD  (gastroesophageal reflux disease); Headache(784.0); Heart murmur; Hiatal hernia; High cholesterol; HTN (hypertension); Obesity; Rotator cuff tear; Sciatic nerve pain; Sciatica; Sleep apnea; Type II diabetes mellitus (HCC); and Vocal cord dysfunction.   reports that she quit smoking about 19 years ago. Her smoking use included Cigarettes. She has a 25.00 pack-year smoking history. She has never used smokeless tobacco.  Past Surgical History:  Procedure Laterality Date  . ABDOMINAL ADHESION SURGERY  ~ 1978  . APPENDECTOMY  1976  . NASAL SINUS SURGERY  1992  . OOPHORECTOMY  1976   w/ovarian cyst excision and appy  . SHOULDER ARTHROSCOPY W/ ROTATOR CUFF REPAIR  01/28/12   left  . TOE SURGERY     for ingrown [id number] ?left  . TOTAL ABDOMINAL HYSTERECTOMY  1985    Allergies  Allergen Reactions  . Lisinopril Cough    Immunization History  Administered Date(s) Administered  . Influenza Split 03/31/2011, 05/19/2013, 03/13/2014, 05/16/2015  . Influenza,inj,Quad PF,36+ Mos 03/13/2016  . Pneumococcal Polysaccharide-23 07/14/2001  . Td 07/15/1998    Family History  Problem Relation Age of Onset  . Heart disease Mother   . Asthma Mother   . Heart failure Maternal Grandmother   . Asthma Son   . Asthma Daughter   . Lupus Daughter   . Colon cancer Neg Hx   . Throat cancer Neg Hx   . Stomach cancer Neg Hx   . Esophageal cancer Neg Hx   . Rectal cancer Neg Hx   . Diabetes      "everybody on both sides"     Current Outpatient Prescriptions:  .  albuterol (PROVENTIL HFA;VENTOLIN HFA) 108 (90 BASE) MCG/ACT inhaler, Inhale 2 puffs into the lungs every 4 (four) hours as needed for wheezing or shortness of breath. , Disp: , Rfl:  .  ALPRAZolam (XANAX) 0.5 MG tablet, Take 0.5 mg by mouth 2 (two) times daily as needed. , Disp: , Rfl:  .  aspirin 81 MG tablet, Take 81 mg by mouth 2 (two) times daily. , Disp: , Rfl:  .  atorvastatin (LIPITOR) 20 MG tablet, Take 20 mg by mouth every  evening. , Disp: , Rfl:  .  Azelastine-Fluticasone (DYMISTA) 137-50 MCG/ACT SUSP, Place into the nose daily., Disp: , Rfl:  .  BELSOMRA 15 MG TABS, Take 1 tablet by mouth daily., Disp: 6 tablet, Rfl: 0 .  chlorpheniramine (CHLOR-TRIMETON) 4 MG tablet,  2 tabs by mouth at bedtime, Disp: , Rfl:  .  citalopram (CELEXA) 10 MG tablet, Take 1 tablet by mouth daily., Disp: , Rfl:  .  cyclobenzaprine (FLEXERIL) 10 MG tablet, Take 1 tablet by mouth as needed., Disp: , Rfl:  .  gabapentin (NEURONTIN) 300 MG capsule, Take 1 capsule by mouth 3 (three) times daily. , Disp: , Rfl:  .  HUMULIN N KWIKPEN 100 UNIT/ML Kiwkpen, Inject 30 Units into the skin 2 (two) times daily., Disp: , Rfl:  .  Linaclotide (LINZESS) 290 MCG CAPS capsule, Take 1 capsule (290 mcg total) by mouth daily., Disp: 30 capsule, Rfl: 2 .  loratadine (CLARITIN) 10 MG tablet, Take 10 mg by mouth every morning., Disp: , Rfl:  .  losartan (COZAAR) 100 MG tablet, Take 1 tablet by mouth daily., Disp: , Rfl:  .  metFORMIN (GLUCOPHAGE) 1000 MG tablet, Take 1 tablet by mouth 2 (two) times daily., Disp: , Rfl:  .  metoCLOPramide (REGLAN) 5 MG/5ML solution, Take 5 mLs (5 mg total) by mouth 4 (four) times daily -  before meals and at bedtime., Disp: 600 mL, Rfl: 1 .  oxyCODONE-acetaminophen (PERCOCET/ROXICET) 5-325 MG tablet, Take 1 tablet by mouth every 4 (four) hours as needed., Disp: 20 tablet, Rfl: 0 .  pantoprazole (PROTONIX) 40 MG tablet, Take 1 tablet (40 mg total) by mouth 2 (two) times daily. (Patient taking differently: Take 40 mg by mouth daily. ), Disp: 60 tablet, Rfl: 3 .  traMADol (ULTRAM) 50 MG tablet, Take 50 mg by mouth every 6 (six) hours as needed for pain. , Disp: , Rfl:  .  VICTOZA 18 MG/3ML SOPN, Inject 18 mg into the skin every morning. , Disp: , Rfl:   Review of Systems     Objective:   Physical Exam   Vitals:   06/15/16 1208  BP: 122/62  Pulse: 75  SpO2: 98%  Weight: 223 lb (101.2 kg)  Height: 5\' 7"  (1.702 m)     Estimated body mass index is 34.93 kg/m as calculated from the following:   Height as of this encounter: 5\' 7"  (1.702 m).   Weight as of this encounter: 223 lb (101.2 kg).  Obese Coughs with a very signiciant VCD type cough No wheeze other than transmitted sound from upper airway Normal heart sounds OBese abdomen No edema Voice modulates all the time     Assessment:       ICD-9-CM ICD-10-CM   1. Dyspnea, unspecified type 786.09 R06.00   2. Chronic cough 786.2 R05   3. ILD (interstitial lung disease) (HCC) 515 J84.9        Plan:     Dyspnea, unspecified type - do cpst bike test  Chronic cough - I think this is cause for most symptoms  - you have vocal cord dysfunction  - we will retgroup after bike stress test  ILD (interstitial lung disease) (HCC) - stable on ct snce 2014. Do not think this is cause of symptoms - though cause not known biopsy can be risky esp if is stavle since 2014  followup  - after bike stress test  (> 50% of this 15 min visit spent in face to face counseling or/and coordination of care)     Dr. Kalman ShanMurali Kayliana Codd, M.D., [facility] Pulmonary and Critical Care Medicine Staff Physician [facility] Pager: [phone], If no answer or between  15:00h - 7:00h: call [phone]  06/15/2016 12:25  PM

## 2016-06-30 ENCOUNTER — Ambulatory Visit (HOSPITAL_COMMUNITY): Payer: PPO | Attending: Internal Medicine

## 2016-06-30 DIAGNOSIS — R0600 Dyspnea, unspecified: Secondary | ICD-10-CM | POA: Insufficient documentation

## 2016-06-30 NOTE — Progress Notes (Unsigned)
Patient [facility]. Patient c/o "I think my sugar is dropping, I didn't have time to eat today" CBG checked at 1300 70 Crackers, peanut butter, and soda provided to patient. CBG recheck in 15 minutes 93. Patient reports feeling much better and was able to complete study without difficulty.  Ave FilterBradley, Megan Genevea, RN

## 2016-07-01 ENCOUNTER — Other Ambulatory Visit (HOSPITAL_COMMUNITY): Payer: Self-pay | Admitting: *Deleted

## 2016-07-01 DIAGNOSIS — R0600 Dyspnea, unspecified: Secondary | ICD-10-CM

## 2016-07-07 DIAGNOSIS — R0600 Dyspnea, unspecified: Secondary | ICD-10-CM | POA: Diagnosis not present

## 2016-07-08 ENCOUNTER — Telehealth: Payer: Self-pay | Admitting: Internal Medicine

## 2016-07-08 NOTE — Telephone Encounter (Signed)
Let [name] know that weight and lack of physical fitness making her dyspneic. She can come into see me or an APP first avail to discuss Thanks  Dr. Kalman ShanMurali Aiven Kampe, M.D., [facility] Pulmonary and Critical Care Medicine Staff Physician [place] [place] [facility] [place] [facility] Pager: [phone], If no answer or between  15:00h - 7:00h: call [phone]  07/08/2016 2:53 AM

## 2016-07-09 NOTE — Telephone Encounter (Signed)
Advised pt of message from MR and verbalized understanding. I made F/u appt for pt over the phone.

## 2016-08-14 ENCOUNTER — Ambulatory Visit (INDEPENDENT_AMBULATORY_CARE_PROVIDER_SITE_OTHER): Payer: Medicare HMO | Admitting: Internal Medicine

## 2016-08-14 ENCOUNTER — Encounter: Payer: Self-pay | Admitting: Internal Medicine

## 2016-08-14 VITALS — BP 118/70 | HR 73 | Ht 67.0 in | Wt 222.2 lb

## 2016-08-14 DIAGNOSIS — J849 Interstitial pulmonary disease, unspecified: Secondary | ICD-10-CM | POA: Diagnosis not present

## 2016-08-14 DIAGNOSIS — J387 Other diseases of larynx: Secondary | ICD-10-CM | POA: Diagnosis not present

## 2016-08-14 DIAGNOSIS — R0600 Dyspnea, unspecified: Secondary | ICD-10-CM

## 2016-08-14 NOTE — Patient Instructions (Addendum)
Dyspnea, unspecified type - bike test indicates vocal cord dysfunction and weight are primary causes of shortness of breat  Vocal cord dysfunction - I think this is cause for most symptoms  -[facility]ST[facility]hen [facility]  ILD (interstitial lung disease) [facility](H[facility]) - stable on ct snce 2014 -> Nov 2017. Do not think this is cause of symptoms - though cause not known biopsy can be risky esp if is stavle since 2014  followup  -6 months or sooner

## 2016-08-14 NOTE — Progress Notes (Signed)
Subjective:     Patient ID: [name], female   DOB: 1956-05-17, 61 y.o.   MRN: [id number]  HPI    OV 02/17/2011. 61 year old female. Obese. AA female. Remote smoker On cpap for OSA (pmd managing). REferred by Dr Delfina Redwood.    New visit for chronic cough. Preesent for many years. Insidious onset.  Progressively with more frequency past few years. Cough rated as moderate. Usually dry cough with associated barking quality. Occ mucus present; thick and clear. Cough worsened by milk products, change in weather and dust. Hx of prior ENT eval in [place] > 15 years ago: diagnosed as "vocal cords sticking together" and remote hx of sinus surgery Also diagnosed with GERD in [place] > 15 years ago; takes protonix.  [facility] Reflux Symptom INdex score is 39 and very c/w LPR cough(severe hoarsness of voice, clearing of throat, ecess throat mucus, post nasal drip, coughing after lying down or eating, choking epidoses, sensation of globus with food sticking in thorat) and associated heart burn. Cough so severe that she could not complete spirometry in office today  Has dyspnea too: few years, insidious onset. Progressive. Rates it as moderate. Dyspnea brought on by associated chest tightness and nasal drainage or even exertion like climbing flight of steps. Dyspnea relieved by rest. Cough and dyspnea associated with wheezing and constant yellow sinus drainage as well. Frequent nocturnal awakenings with choking present. Above resulted in asthma diagnosis versus tracheobronchiolotis > 15 years ago: on advair since then which she is unsure is helping. She herself is not convinced she has asthma.   Associated med intake shows tramadol (for shoulder pain and sicatica), fish oil/ flaxseed (2 years) and ACE inhibitor intake (been on lisniopril for > 10 years due to diabetes)   Pas med hx review fromn outside record notes "hx of asthma and vocal cord dysfunction and post nasal drip. Recalls frequent ER visits atleast one  per month (last er visit 2 months ago per hx) to [facility]. Denies ICU admits, intubations for same.   REC Cough is from ACE Inhibitor, sinus drainage, vocal cord dysfunction, acid reflux and possible cough all conspiring to cause cyclical cough/LPR cough #Sinus drainage  - continue nasal steroid - refer to [facility] ENT Dr. Wilburn Cornelia or Dr Redmond Baseman #Possible Acid Reflux  - continue protonix   - take diet sheet from [place] - avoid colas, spices, cheeses, spirits, red meats, beer, chocolates, fried foods etc.,   - sleep with head end of bed elevated  - eat small frequent meals  - do not go to bed for 3 hours after last meal - stop fish oil and flaxseed for now #Asthma   - continue advair for now - might have to consider changing over to something else later depending on ENT evaluation #Vocal Cord Dysfunction - see ENT doctor first  -later  will consider referral to speech therapy with [name] #BP  - stop lisinopril - take benicar 1 tablet daily - take sample, nurse will add it in med list #Followup - I will see you in 4-6 weeks.  - Reattempt spirometry with [name] at time of followup -Important you comply with advice 100% correctly   OV 03/31/11: Followup cough. SAw ENT 89/17./12: no anatomic path or VCD noticed at time of exam though patient noted to have choked.  She states that she was not happy with her experience at ENT visit and does not want to visit the same office again. She is compliant  with instructions except for diet though she has made improvements there. Off lisniopril. Follows sinus, gerd and astham advice. Does not like advair diskus. Overall cough is much better. She is happy with improvement. RSI cough score dropped from 39 to 14.  She sses her hoarsenss, clearing and pos nasal drip as mild-moderate  PFTS show no more flow volume loop issues. There is restriction with mixed obstruction. DLCO 67%   REC Cough is from sinus drainage, vocal cord dysfunction,  acid reflux and possible cough all conspiring to cause cyclical cough/LPR cough  #Sinus drainage  - continue nasal steroid - nurse will do script for generic fluticasone -  #Acid Reflux  - continue protonix   - take diet sheet from [place] [facility] - avoid colas, spices, cheeses, spirits, red meats, beer, chocolates, fried foods etc.,   - sleep with head end of bed elevated  - eat small frequent meals  - do not go to bed for 3 hours after last meal - cotninue to avoid fish oil and flaxseed   #Asthma   - continue advair but nurse will give you sample of HFA and teach you how to take it with spacer; she will do script as well  #Vocal Cord Dysfunction - referral to speech therapy with [name]  #BP  - will add lisinopril to allergy list - take benicar 1 tablet daily or the equivalent Dr Delfina Redwood gives you  #Followup - I will see you in 8 weeks.  - -Important you comply with advice 100% correctly - Glad you are much better - Flu shot today please   OV 06/26/2011 Followup for chronic cough. She continues to do better. Subjectively feels much better compared to last visit. Although objectively her cough score has only reduced by 1 point. Today, RSI cough score is 13. Level III postnasal drip. Level to hoarseness of voice, cough after lying down, choking episodes, and annoying cough. Level I sensation of lump in throat and heartburn.  In talking to her she is compliant with her nasal steroid and her Protonix for acid reflux but not compliant with diet for acid reflux. She is on Advair HFA with spacer for presumed asthma and she feels this makes her cough less compared to Advair discus. We discussed more about her asthma history and she says that she was never sure she had asthma diagnosis in [place] it was a presumptive diagnosis. She is eager to come off Advair and give a trial without it. She also gives a history of allergies and allergy shots while living in [place] and she is open to an  allergy evaluation right now. In terms of cyclical cough she has finished speech therapy evaluation and graduated from the program. Overall she feels quality-of-life is acceptable although she would like to see cough improve further. So far we have not tried Neurontin    Past, Family, Social reviewed: no change since last visit except that Dr Deforest Hoyles is new PMD. She now states she has year round allergies. Recollects while in [place] used to take allergy shots twice a week 10 years ago. Since moving to [place] 4 years ago not seen an allergist. Laverle Hobby to see allergist. She feels she does not have asthma.  Cough is from sinus drainage, vocal cord dysfunction, acid reflux and possible asthma or allergiesh all working together to cause cyclical cough/LPR cough  #Sinus drainage  - continue nasal steroid - nurse will do script for generic fluticasone  -  #Acid Reflux  -  continue protonix  - contniue (REALLY IMPORANT IS DIET) diet sheet from [place] [facility] - avoid colas, spices, cheeses, spirits, red meats, beer, chocolates, fried foods etc.,  - sleep with head end of bed elevated  - eat small frequent meals  - do not go to bed for 3 hours after last meal  - cotninue to avoid fish oil and flaxseed  #Asthma  - since diagnosis of asthma was in doubt. Please stop Advair and just use albuterol as needed  -We'll do spirometry at followup  # allergies  - Please see Dr. Baird Lyons in our office for allergy evaluation; will do referral  #Vocal Cord Dysfunction  - congratulations on graduating from program with speech therapy [name]  #BP  - Continue losartan  #Followup  - I will see you in 8 weeks.  - -Important you comply with advice 100% correctly  - Glad you are much better  - Cough score worksheet at followup   OV 08/25/2011 Followup for chronic cough. She is off advair as advised. She is doing gerd control.  Ojectively her cough score is not reduced. Last visit score was 13 but now it is 14.  (Level III postnasal drip, and post nasal drip. . Level 2  hoarseness of voice and choking episodes. Level 1 - cough after lying down,and  sensation of lump in throat and heartburn). Howevefr, subjectively cough much improved. Still with post nasal drip. Has seen Dr Annamaria Boots for allergy 08/06/11 and 08/19/11. RAST serum profile negative except for elevated [place] IGE. Allergy skin test pending 09/16/11. On visit 08/19/11 to Dr Annamaria Boots had atypical chest pain - releived with ppi in office. EKG okay. Trop was normal. . Of note, Dr Annamaria Boots is also addressing her prior OSA issues.   ROS c/o anxiety, insomnia, also chest pains  - atypical  Spirometry  - fev1 1.74L/72%. Ratio 78    #Chest pains  - please see [facility] cardiology asap - > equivocal stress test and but clinically low probability February 2013; placed in clinical followup #Cough  - for sinus: follow Dr Annamaria Boots recommendation  - for acid reflux: follow medications and diet  - for possible asthma: stay off advair and once cardiology ok, please have methacholine challenge test  #Followup  - complete cardiology visit and methacholine challenge  - 4-6 weeks from now  09/16/11 with Dr Annamaria Boots Allergist- 30 yoF former smoker referred by Dr Chase Caller who has been seeing her for multifactorial cough, questioning an allergic component At last visit she had presented with chest pain, preventing planned allergy skin testing. Now following with cardiology and pending CT scan of her heart/Dr. Burt Knack. Still complains of nasal congestion. Able to use her CPAP more regularly when her nose is not stopped up. Unable to do methacholine challenge last month because her baseline is from a tree scores were too low. CPAP autotitration done/[facility]. Pending download. PFT- 09/11/11- FVC 2.15/68%, FEV1 1.59/ 64%, FEV1/FVC 0.80. FEF 25-75 % 0.37/ 15%   OV 03/21/2013 Followup chronic cough  - I have not seen her in nearly 15 months. She says that basically her cough resolved but  cough is no longer chronic and only occurs intermittently when she has "bronchitis". In the last year she's had antibiotics and prednisone for acute episodes of coughing at least 3 times. In between episodes she is asymptomatic. Currently she feels she's having one such episode in the past 3 weeks with chest congestion, cough with white mucus, fatigue and associated wheeze. Currently  RSI cough score is 21 and show severe cough.  - Of note, she has not had methacholine challenge test that I advised a year and half ago. She says that she does not have vocal cord dysfunction based on Dr. Mickie Hillier exam in August 2012 ENT physician   REC levaquin and pred burst  04/20/2013 Follow up and Med review  new med calendar - pt brought all meds with her today.  does report some sinus pressure, congestion, SOB, wheezing, chest tightness. Is feeling better but still gets into coughing fits that takes her a while to calm down. Strong odors seem to causes attacks along with laughing  She did have an attack in the office. Sats were normal however she has significant upper airway psuedowheezing w/ barking cough.  No fever , chest pain, over reflux , orthopnea or edema.  No using CPAP lately   REC top Advair . Add Chlor trimeton 92m 2 At bedtime   Change Loratadine 161min am.  Add Pepcid 2099mt bedtime   Avoid throat clearing , use sips of water.  Saline nasal rinses Twice daily  .  NO MINT PRODUCTS  GERD diet .  We are setting you up for a CT neck .  Restart CPAP At bedtime   follow up Dr. RamChase Caller 4 weeks.  Please contact office for sooner follow up if symptoms do not improve or worsen or seek emergency care    OV  05/26/2013 Chief Complaint  Patient presents with  . Follow-up    Pt c/o chest tightness, dry cough, wheezing and sob. Pt states this was some better while she was on prednisone. Last dose x 3 weeks ago.   Followup for chronic cough. She says her last 2-3 weeks that she's having  an acute bronchitis flare with yellow phlegm. She feels she needs antibiotics and prednisone. In fact in the time that she last saw me in September 2014 she's had another round of antibiotics and prednisone with her primary care physician Dr. HusDeforest Hoyleshe feels her chest is congested although in the office she is clearly having a barking cough, laryngeal spasm and upper airway noise intermittently with changes in voice. It is fairly significant. I reminded her that this is consistent with vocal cord dysfunction but she said that her ear nose throat surgeon cleared her for the same. Of note, she had a CT scan of the neck and this was normal.  07/26/2013 Follow up  2 month follow up - reports some  congestion, head congestion w/ light yellow mucus that is on/off. Has sinus drainage daily . Is not using claritin or chlortrimeton as recommended.  Was taken off ACE in past , now on Cozaar.   RSI cough score unchanged at 21  Had Esophagram on 1/8 w/ mild GERD/HH. No sign delay emptying .  No chest pain, orthopnea, edema , fever.  +hoarseness on/off.   She says she feels so much better with CPAP , using during daytime for naps and At bedtime  . Is amazed at how much she feels better.    OV 03/20/2014  Chief Complaint  Patient presents with  . Follow-up    Pt states she is unable to hold her voice as long when she sings. Pt c/o PND and prod cough, pt thinks its d/t allergies. Pt c/o chest soreness on right upper chest.    Followup chronic cough  - I have not seen the patient in nearly 10 months. Overall cough  was stable but recently she has had a flareup. I'm not so sure that she is followed instructions compliantly. It took me a while to understand the medication regimen but it appears that she takes dymista for  Sinuses but saline wash as needed, and not taking claritin, Advair as needed for her lungs, Protonix schedule followup acid reflux but Pepcid only as needed. She is only somewhat compliant with  the Neurontin. She continues to have sinus drainage, clearing of the throat and the laryngeal/barking cough that she feels is coming from the throat. This is associated with dyspnea and is of moderate severity, and some wheezing but she feels wheeze is upper airway  REC #Chronic cough  - I thnk cough is related to voice box issues and loss of neural control of the same - Please take prednisone 40 mg x1 day, then 30 mg x1 day, then 20 mg x1 day, then 10 mg x1 day, and then 5 mg x1 day and stop - refer Dr Ernestine Conrad of [facility] for chronic cough - for now continue current nasal, acid reflux and neurontin therapies - continue albuterol as needed ; for now hold off scheduled advair - repeat HRCT chest wo contrast for chronic cough  #FOllowup  3 months - after you see Dr Joya Gaskins of [facility]  Come sooner if there are problems o  OV 10/22/2014  Chief Complaint  Patient presents with  . Follow-up    Pt stated her cough has improved. Pt c/o hoarseness and chest tightness. Pt is wearing CPAP nightly and denies any issues with machine, mask or pressure.     FU chronic cough  - IN interim in Sept 2015 had CT chest : CT chest 03/26/14 shows worsening infiltreats in bases compaed to nov 2014. COncern for aspiration and referred  To GI. She does not know details of GI Visit but review of records shows she has gastroparesis and hs been started on reglan. Also she saw Dr Joya Gaskins of ENT in [place]. WIth above measure cough has resolved. /nearly resolved. No respiratory issues.   reports that she quit smoking about 18 years ago. Her smoking use included Cigarettes. She has a 25 pack-year smoking history. She has never used smokeless tobacco.   OV 11/12/2014  Chief Complaint  Patient presents with  . Follow-up    Pt here after PFT. Pt stated her breathing has slightly improved,she is less SOB. Pt stated her cough has also imrpoved. Pt has not yet had autoimmune panel.    Follow-up  chronic cough with interstitial lung disease findings  As of last visit her cough resolved. Cough was related to aspiration. As soon as she started following gastroenterology advice cough resolved. However follow-up CT scan of the chest in April 2016 compared to fall 2015 showed persistent ILD findings as described below [personally reviewed image]. At this point she is asymptomatic. I had her do pulmonary function test and autoimmune workup and reports for follow-up. Of these she only completed pulmonary function test that shows restriction with slight reduction in diffusion capacity [some of the restriction can be due to obesity]. She continues to be asymptomatic. She's not having reflux symptoms anymore. Of note, she forgot to have a autoimmune test. She does admit that her daughter has lupus   IMPRESSION: CT chest 10/29/2014 personally reviewed image  1. Stable findings of peribronchovascular ground-glass attenuation, predominantly in the basal portions of the lungs bilaterally, again favored to reflect nonspecific interstitial pneumonia (NSIP). 2. Mild  air trapping, indicative of mild small airways disease. Electronically Signed  By: Vinnie Langton M.D.  On: 10/29/2014 16:19  Pulmonary function test 10/31/2014 shows restriction with reduced diffusion capacity. FVC 2.1 L/69%, FEV1 1.8 L/75% and ratio of 86. No broncho-dilator response. Total lung capacity 3.5 L/63% and DLCO 20.0/70% Sleep Apnea FOLLOWS FOR: pt wearing cpap nightly, c/o mask getting hot sometimes qhs.  has trouble staying asleep.     OV 06/10/2015  Chief Complaint  Patient presents with  . Follow-up    Pt states she has been getting more frequent sinus infections, slight increase in SOB, chest heaviness. Pt here after HRCT. Pt wearing CPAP nightly.    Follow-up multifactorial chronic cough Follow-up mild basal interstitial lung disease not otherwise specified  She currently reports that she is not having much  cough although after I left the office I heard a cough and periodically she did have variable upper airway noise. She continues to have gastroparesis related acid reflux particularly when she eats food she regurgitates. She last saw GI March 2016 and was asked to increase and metoclopramide. She's not having much dyspnea. Overall she feels okay. She is having some atypical chest pain going on for a few months in the right supramammary area   07/16/2015-61 year old female former smoker followed by Dr. Chase Caller for mild ILD, recurrent sinus infections, chronic cough, Sleep Apnea FOLLOWS FOR: pt wearing cpap nightly, c/o mask getting hot sometimes qhs.  has trouble staying asleep.   Here for CPAP follow-up. "Loves it" and says she is wearing it all night every night. Fullface mask, [facility]/12.  Not noticing active respiratory problems currently with little cough    OV  09/18/2015  Chief Complaint  Patient presents with  . Follow-up    pt recently seen at [facility] ED for URI- pt c/o prod cough with thick clear mucus, SOB with exertion, wheezing.     Follow-up chronic cough which is multifactorial with a strong component of irritable larynx syndrome, vocal cord dysfunction and gastroparesis related to diabetes  Follow-up interstitial lung disease probably as a result of above but etiology uncertain   I last saw her in November 2016. She was supposed to follow-up with me in November 2017. In December 2016 I did make her see GI. They did a swallow study. I reviewed those results and she did not aspirate but coughed significantly during swallowing. No specific interventions have been recommended. It appears she has increase and metoclopramide. Then 09/01/2015 ended up in the ER with shortness of breath and wheezing episode and was given antibiotics and prednisone according to her history. Now she significantly improved and close to baseline. She does have some wheezing but she says it's all from the upper  airway. She does not want another course of anabiotic's of prednisone. She is on Neurontin but this is not helping. Her creatinine was 1 mg percent troponin was normal and hemoglobin was normal 09/01/2015. She had chest x-ray 09/01/2015 and is clear per report. I did not visualize this film.  In terms of her vocal cord dysfunction review of my prior notes shows that she is seen ENT multiple times. Most recently she saw [facility]NT Dr. Joya Gaskins and she had resolution but now the cough due to irritable larynx has come back. She says she cannot go back to [facility] because of insurance reasons. She is willing to reestablish with the ENT locally.   OV 06/15/2016  Chief Complaint  Patient presents with  . Follow-up  Pt states she is waking every 2 hours because she is SOB, pt is still wearing CPAP. Pt c/o prod cough with clear mucus. Pt states she just completed a pred taper and abx for URI.      Follow-up multifactorial chronic cough Follow-up mild basal interstitial lung disease not otherwise specified   Obese female with significant vocal cord dysfunction/irritable larynx syndrome returns for follow-up. Last seen March 2017. She had follow-up high-resolution CT chest 06/09/2016 that I personally visualized. The findings show extremely subtle presence of interstitial lung disease all stable since 2014. In terms of symptoms of cough she is overall stable. However she tells me she has significant episodes of dyspnea at night when she wears a CPAP. Here in the office when asked her to open her mouth she made it given the significant upper airway coughing spell. She also complains of dyspnea on exertion.   OV 08/14/2016  Chief Complaint  Patient presents with  . Follow-up    Was told to follow up after stress test. Stress test was done back in Dec 2017. Breathing has ok for the past few weeks, had a minor URI.      62 y.o. Marland KitchenZANIAH Miles presents for fu of CPST results in 07/01/16.  Reviewed terst results with her. Somewhat submaximal of RER 0.96. Audible wheezing prior to start of test. No EIB. Has vo2 max that went from low normal to normal when correced for IBW. Not clear if there was diast dysfn. Got dyspneic    has a past medical history of Anxiety; Arthritis; Asthma; Blood transfusion without reported diagnosis; Chronic bronchitis ([place]); Cold extremity without peripheral vascular disease; COPD (chronic obstructive pulmonary disease) ([place]); Environmental allergies; Exertional dyspnea; GERD (gastroesophageal reflux disease); Headache(784.0); Heart murmur; Hiatal hernia; High cholesterol; HTN (hypertension); Obesity; Rotator cuff tear; Sciatic nerve pain; Sciatica; Sleep apnea; Type II diabetes mellitus ([place]); and Vocal cord dysfunction.   reports that she quit smoking about 20 years ago. Her smoking use included Cigarettes. She has a 25.00 pack-year smoking history. She has never used smokeless tobacco.  Past Surgical History:  Procedure Laterality Date  . ABDOMINAL ADHESION SURGERY  ~ 1978  . APPENDECTOMY  1976  . NASAL SINUS SURGERY  1992  . OOPHORECTOMY  1976   w/ovarian cyst excision and appy  . SHOULDER ARTHROSCOPY W/ ROTATOR CUFF REPAIR  01/28/12   left  . TOE SURGERY     for ingrown [id number] ?left  . TOTAL ABDOMINAL HYSTERECTOMY  1985    Allergies  Allergen Reactions  . Lisinopril Cough    Immunization History  Administered Date(s) Administered  . Influenza Split 03/31/2011, 05/19/2013, 03/13/2014, 05/16/2015  . Influenza,inj,Quad PF,36+ Mos 03/13/2016  . Pneumococcal Polysaccharide-23 07/14/2001  . Td 07/15/1998    Family History  Problem Relation Age of Onset  . Heart disease Mother   . Asthma Mother   . Heart failure Maternal Grandmother   . Asthma Son   . Asthma Daughter   . Lupus Daughter   . Colon cancer Neg Hx   . Throat cancer Neg Hx   . Stomach cancer Neg Hx   . Esophageal cancer Neg Hx   . Rectal cancer Neg Hx   . Diabetes       "everybody on both sides"     Current Outpatient Prescriptions:  .  albuterol (PROVENTIL HFA;VENTOLIN HFA) 108 (90 BASE) MCG/ACT inhaler, Inhale 2 puffs into the lungs every 4 (four) hours as needed for wheezing or shortness of breath. ,  Disp: , Rfl:  .  ALPRAZolam (XANAX) 0.5 MG tablet, Take 0.5 mg by mouth 2 (two) times daily as needed. , Disp: , Rfl:  .  aspirin 81 MG tablet, Take 81 mg by mouth 2 (two) times daily. , Disp: , Rfl:  .  atorvastatin (LIPITOR) 20 MG tablet, Take 20 mg by mouth every evening. , Disp: , Rfl:  .  Azelastine-Fluticasone (DYMISTA) 137-50 MCG/ACT SUSP, Place into the nose daily., Disp: , Rfl:  .  BELSOMRA 15 MG TABS, Take 1 tablet by mouth daily., Disp: 6 tablet, Rfl: 0 .  chlorpheniramine (CHLOR-TRIMETON) 4 MG tablet, 2 tabs by mouth at bedtime, Disp: , Rfl:  .  citalopram (CELEXA) 10 MG tablet, Take 1 tablet by mouth daily., Disp: , Rfl:  .  cyclobenzaprine (FLEXERIL) 10 MG tablet, Take 1 tablet by mouth as needed., Disp: , Rfl:  .  gabapentin (NEURONTIN) 300 MG capsule, Take 1 capsule by mouth 3 (three) times daily. , Disp: , Rfl:  .  HUMULIN N KWIKPEN 100 UNIT/ML Kiwkpen, Inject 30 Units into the skin 2 (two) times daily., Disp: , Rfl:  .  Linaclotide (LINZESS) 290 MCG CAPS capsule, Take 1 capsule (290 mcg total) by mouth daily., Disp: 30 capsule, Rfl: 2 .  loratadine (CLARITIN) 10 MG tablet, Take 10 mg by mouth every morning., Disp: , Rfl:  .  losartan (COZAAR) 100 MG tablet, Take 1 tablet by mouth daily., Disp: , Rfl:  .  metFORMIN (GLUCOPHAGE) 1000 MG tablet, Take 1 tablet by mouth 2 (two) times daily., Disp: , Rfl:  .  metoCLOPramide (REGLAN) 5 MG/5ML solution, Take 5 mLs (5 mg total) by mouth 4 (four) times daily -  before meals and at bedtime., Disp: 600 mL, Rfl: 1 .  oxyCODONE-acetaminophen (PERCOCET/ROXICET) 5-325 MG tablet, Take 1 tablet by mouth every 4 (four) hours as needed., Disp: 20 tablet, Rfl: 0 .  pantoprazole (PROTONIX) 40 MG tablet, Take 1  tablet (40 mg total) by mouth 2 (two) times daily. (Patient taking differently: Take 40 mg by mouth daily. ), Disp: 60 tablet, Rfl: 3 .  traMADol (ULTRAM) 50 MG tablet, Take 50 mg by mouth every 6 (six) hours as needed for pain. , Disp: , Rfl:  .  VICTOZA 18 MG/3ML SOPN, Inject 18 mg into the skin every morning. , Disp: , Rfl:    Review of Systems     Objective:   Physical Exam Vitals:   08/14/16 1020  BP: 118/70  Pulse: 73  SpO2: 98%  Weight: 222 lb 3.2 oz (100.8 kg)  Height: 5' 7"$  (1.702 m)   Estimated body mass index is 34.8 kg/m as calculated from the following:   Height as of this encounter: 5' 7"$  (1.702 m).   Weight as of this encounter: 222 lb 3.2 oz (100.8 kg). discusison only    Assessment:       ICD-9-CM ICD-10-CM   1. Dyspnea, unspecified type 786.09 R06.00   2. ILD (interstitial lung disease) ([place]) 515 J84.9   3. Irritable larynx syndrome 478.79 J38.7       Plan:     Dyspnea, unspecified type - bike test indicates vocal cord dysfunction and weight are primary causes of shortness of breat  Vocal cord dysfunction - I think this is cause for most symptoms  -refer Dr Ernestine Conrad of ENT at [facility]  ILD (interstitial lung disease) [facility]) - stable on ct snce 2014 -> Nov 2017. Do not think this is cause of symptoms -  though cause not known biopsy can be risky esp if is stavle since 2014  followup  -6 months or sooner    (> 50% of this 15 min visit spent in face to face counseling or/and coordination of care)  Dr. Brand Males, M.D., [facility].C.P Pulmonary and Critical Care Medicine Staff Physician [facility] Pager: [phone], If no answer or between  15:00h - 7:00h: call [phone]  08/14/2016 10:55 AM

## 2016-08-14 NOTE — Addendum Note (Signed)
Addended by: Maurene CapesPOTTS, Lenard Kampf M on: 08/14/2016 11:23 AM   Modules accepted: Orders

## 2016-09-01 DIAGNOSIS — R1031 Right lower quadrant pain: Secondary | ICD-10-CM | POA: Diagnosis not present

## 2016-09-01 DIAGNOSIS — K5901 Slow transit constipation: Secondary | ICD-10-CM | POA: Diagnosis not present

## 2016-10-07 DIAGNOSIS — J019 Acute sinusitis, unspecified: Secondary | ICD-10-CM | POA: Diagnosis not present

## 2016-10-07 DIAGNOSIS — J4542 Moderate persistent asthma with status asthmaticus: Secondary | ICD-10-CM | POA: Diagnosis not present

## 2016-10-07 DIAGNOSIS — J383 Other diseases of vocal cords: Secondary | ICD-10-CM | POA: Diagnosis not present

## 2016-10-26 DIAGNOSIS — I10 Essential (primary) hypertension: Secondary | ICD-10-CM | POA: Diagnosis not present

## 2016-10-26 DIAGNOSIS — Z23 Encounter for immunization: Secondary | ICD-10-CM | POA: Diagnosis not present

## 2016-10-26 DIAGNOSIS — G4730 Sleep apnea, unspecified: Secondary | ICD-10-CM | POA: Diagnosis not present

## 2016-10-26 DIAGNOSIS — J383 Other diseases of vocal cords: Secondary | ICD-10-CM | POA: Diagnosis not present

## 2016-10-26 DIAGNOSIS — Z0000 Encounter for general adult medical examination without abnormal findings: Secondary | ICD-10-CM | POA: Diagnosis not present

## 2016-10-26 DIAGNOSIS — J310 Chronic rhinitis: Secondary | ICD-10-CM | POA: Diagnosis not present

## 2016-10-26 DIAGNOSIS — E1140 Type 2 diabetes mellitus with diabetic neuropathy, unspecified: Secondary | ICD-10-CM | POA: Diagnosis not present

## 2016-10-26 DIAGNOSIS — R69 Illness, unspecified: Secondary | ICD-10-CM | POA: Diagnosis not present

## 2016-10-26 DIAGNOSIS — J4542 Moderate persistent asthma with status asthmaticus: Secondary | ICD-10-CM | POA: Diagnosis not present

## 2016-10-26 DIAGNOSIS — Z7189 Other specified counseling: Secondary | ICD-10-CM | POA: Diagnosis not present

## 2016-10-26 DIAGNOSIS — E782 Mixed hyperlipidemia: Secondary | ICD-10-CM | POA: Diagnosis not present

## 2016-10-26 DIAGNOSIS — M519 Unspecified thoracic, thoracolumbar and lumbosacral intervertebral disc disorder: Secondary | ICD-10-CM | POA: Diagnosis not present

## 2016-10-26 DIAGNOSIS — K5901 Slow transit constipation: Secondary | ICD-10-CM | POA: Diagnosis not present

## 2016-10-26 DIAGNOSIS — Z1389 Encounter for screening for other disorder: Secondary | ICD-10-CM | POA: Diagnosis not present

## 2016-10-26 DIAGNOSIS — Z1159 Encounter for screening for other viral diseases: Secondary | ICD-10-CM | POA: Diagnosis not present

## 2016-10-26 DIAGNOSIS — K219 Gastro-esophageal reflux disease without esophagitis: Secondary | ICD-10-CM | POA: Diagnosis not present

## 2016-10-26 DIAGNOSIS — K3184 Gastroparesis: Secondary | ICD-10-CM | POA: Diagnosis not present

## 2016-11-04 DIAGNOSIS — H01025 Squamous blepharitis left lower eyelid: Secondary | ICD-10-CM | POA: Diagnosis not present

## 2016-11-04 DIAGNOSIS — H10413 Chronic giant papillary conjunctivitis, bilateral: Secondary | ICD-10-CM | POA: Diagnosis not present

## 2016-11-04 DIAGNOSIS — H40013 Open angle with borderline findings, low risk, bilateral: Secondary | ICD-10-CM | POA: Diagnosis not present

## 2016-11-04 DIAGNOSIS — H01021 Squamous blepharitis right upper eyelid: Secondary | ICD-10-CM | POA: Diagnosis not present

## 2016-11-04 DIAGNOSIS — H2513 Age-related nuclear cataract, bilateral: Secondary | ICD-10-CM | POA: Diagnosis not present

## 2016-11-04 DIAGNOSIS — E119 Type 2 diabetes mellitus without complications: Secondary | ICD-10-CM | POA: Diagnosis not present

## 2016-11-04 DIAGNOSIS — H01022 Squamous blepharitis right lower eyelid: Secondary | ICD-10-CM | POA: Diagnosis not present

## 2016-11-04 DIAGNOSIS — H01024 Squamous blepharitis left upper eyelid: Secondary | ICD-10-CM | POA: Diagnosis not present

## 2016-11-25 DIAGNOSIS — E669 Obesity, unspecified: Secondary | ICD-10-CM | POA: Diagnosis not present

## 2016-11-25 DIAGNOSIS — R69 Illness, unspecified: Secondary | ICD-10-CM | POA: Diagnosis not present

## 2016-11-25 DIAGNOSIS — Z7951 Long term (current) use of inhaled steroids: Secondary | ICD-10-CM | POA: Diagnosis not present

## 2016-11-25 DIAGNOSIS — Z6834 Body mass index (BMI) 34.0-34.9, adult: Secondary | ICD-10-CM | POA: Diagnosis not present

## 2016-11-25 DIAGNOSIS — I10 Essential (primary) hypertension: Secondary | ICD-10-CM | POA: Diagnosis not present

## 2016-11-25 DIAGNOSIS — J309 Allergic rhinitis, unspecified: Secondary | ICD-10-CM | POA: Diagnosis not present

## 2016-11-25 DIAGNOSIS — K5900 Constipation, unspecified: Secondary | ICD-10-CM | POA: Diagnosis not present

## 2016-11-25 DIAGNOSIS — G4730 Sleep apnea, unspecified: Secondary | ICD-10-CM | POA: Diagnosis not present

## 2016-11-25 DIAGNOSIS — E785 Hyperlipidemia, unspecified: Secondary | ICD-10-CM | POA: Diagnosis not present

## 2016-11-25 DIAGNOSIS — M47816 Spondylosis without myelopathy or radiculopathy, lumbar region: Secondary | ICD-10-CM | POA: Diagnosis not present

## 2016-11-25 DIAGNOSIS — H6123 Impacted cerumen, bilateral: Secondary | ICD-10-CM | POA: Diagnosis not present

## 2016-11-25 DIAGNOSIS — Z0000 Encounter for general adult medical examination without abnormal findings: Secondary | ICD-10-CM | POA: Diagnosis not present

## 2016-11-25 DIAGNOSIS — K219 Gastro-esophageal reflux disease without esophagitis: Secondary | ICD-10-CM | POA: Diagnosis not present

## 2016-11-25 DIAGNOSIS — Z794 Long term (current) use of insulin: Secondary | ICD-10-CM | POA: Diagnosis not present

## 2016-11-25 DIAGNOSIS — E1140 Type 2 diabetes mellitus with diabetic neuropathy, unspecified: Secondary | ICD-10-CM | POA: Diagnosis not present

## 2016-11-25 DIAGNOSIS — R29898 Other symptoms and signs involving the musculoskeletal system: Secondary | ICD-10-CM | POA: Diagnosis not present

## 2016-11-25 DIAGNOSIS — Z7982 Long term (current) use of aspirin: Secondary | ICD-10-CM | POA: Diagnosis not present

## 2016-11-25 DIAGNOSIS — Z791 Long term (current) use of non-steroidal anti-inflammatories (NSAID): Secondary | ICD-10-CM | POA: Diagnosis not present

## 2016-11-25 DIAGNOSIS — G4700 Insomnia, unspecified: Secondary | ICD-10-CM | POA: Diagnosis not present

## 2016-11-30 ENCOUNTER — Encounter (HOSPITAL_COMMUNITY): Payer: Self-pay | Admitting: Emergency Medicine

## 2016-11-30 ENCOUNTER — Emergency Department (HOSPITAL_COMMUNITY): Payer: Medicare HMO

## 2016-11-30 ENCOUNTER — Emergency Department (HOSPITAL_COMMUNITY)
Admission: EM | Admit: 2016-11-30 | Discharge: 2016-11-30 | Disposition: A | Discharge status: Home / Self Care | Payer: Medicare HMO | Attending: Emergency Medicine | Admitting: Emergency Medicine

## 2016-11-30 DIAGNOSIS — S4991XA Unspecified injury of right shoulder and upper arm, initial encounter: Secondary | ICD-10-CM | POA: Diagnosis present

## 2016-11-30 DIAGNOSIS — Z7982 Long term (current) use of aspirin: Secondary | ICD-10-CM | POA: Insufficient documentation

## 2016-11-30 DIAGNOSIS — M62838 Other muscle spasm: Secondary | ICD-10-CM | POA: Diagnosis not present

## 2016-11-30 DIAGNOSIS — E119 Type 2 diabetes mellitus without complications: Secondary | ICD-10-CM | POA: Diagnosis not present

## 2016-11-30 DIAGNOSIS — Z79899 Other long term (current) drug therapy: Secondary | ICD-10-CM | POA: Insufficient documentation

## 2016-11-30 DIAGNOSIS — Z87891 Personal history of nicotine dependence: Secondary | ICD-10-CM | POA: Diagnosis not present

## 2016-11-30 DIAGNOSIS — M25511 Pain in right shoulder: Secondary | ICD-10-CM | POA: Diagnosis not present

## 2016-11-30 DIAGNOSIS — Y929 Unspecified place or not applicable: Secondary | ICD-10-CM | POA: Insufficient documentation

## 2016-11-30 DIAGNOSIS — I10 Essential (primary) hypertension: Secondary | ICD-10-CM | POA: Insufficient documentation

## 2016-11-30 DIAGNOSIS — Y999 Unspecified external cause status: Secondary | ICD-10-CM | POA: Insufficient documentation

## 2016-11-30 DIAGNOSIS — Z794 Long term (current) use of insulin: Secondary | ICD-10-CM | POA: Diagnosis not present

## 2016-11-30 DIAGNOSIS — Y939 Activity, unspecified: Secondary | ICD-10-CM | POA: Diagnosis not present

## 2016-11-30 DIAGNOSIS — X509XXA Other and unspecified overexertion or strenuous movements or postures, initial encounter: Secondary | ICD-10-CM | POA: Diagnosis not present

## 2016-11-30 DIAGNOSIS — J449 Chronic obstructive pulmonary disease, unspecified: Secondary | ICD-10-CM | POA: Diagnosis not present

## 2016-11-30 MED ORDER — OXYCODONE-ACETAMINOPHEN 5-325 MG PO TABS
1.0000 | ORAL_TABLET | ORAL | Status: DC | PRN
  Administered 2016-11-30: 1 via ORAL
  Filled 2016-11-30: qty 1

## 2016-11-30 MED ORDER — METHOCARBAMOL 500 MG PO TABS: 500.0000 mg | ORAL_TABLET | Freq: Three times a day (TID) | ORAL | 0 refills | Status: DC | PRN

## 2016-11-30 NOTE — ED Notes (Signed)
ED Provider at bedside. 

## 2016-11-30 NOTE — ED Triage Notes (Signed)
Pt sts right shoulder and neck pain after picking up chair yesterday

## 2016-11-30 NOTE — ED Notes (Signed)
Patient transported to X-ray 

## 2016-11-30 NOTE — ED Provider Notes (Signed)
[facility]-EMERGENCY DEPT Provider Note   CSN: [id number] Arrival date & time: 11/30/16  1058  By signing my name below, I, [name], attest that this documentation has been prepared under the direction and in the presence of [name], MD. Electronically Signed: Sonum Miles, Scribe. 11/30/16. 1:16 PM.  History   Chief Complaint Chief Complaint  Patient presents with  . Shoulder Pain    The history is provided by the patient. No language interpreter was used.    HPI Comments: [name] is a 61 y.o. female who presents to the Emergency Department complaining of constant, unchanged right shoulder pain with associated right lateral neck pain that began yesterday. She states she was picking up a chair when she heard a "crunch" to the affected area. She has tried Motrin and Tylenol without significant relief. She denies any other injuries or complaints at this time.  Past Medical History:  Diagnosis Date  . Anxiety   . Arthritis    "both shoulders"  . Asthma   . Blood transfusion without reported diagnosis   . Chronic bronchitis (HCC)   . Cold extremity without peripheral vascular disease    bilaterally; "from my knees down into my feet; they get ice cold"  . COPD (chronic obstructive pulmonary disease) (HCC)   . Environmental allergies    "year round"  . Exertional dyspnea   . GERD (gastroesophageal reflux disease)   . Headache(784.0)   . Heart murmur   . Hiatal hernia   . High cholesterol   . HTN (hypertension)   . Obesity   . Rotator cuff tear   . Sciatic nerve pain    "tailbone down into my legs when it flares up"  . Sciatica   . Sleep apnea    CPAP, sleep study on [facility]  . Type II diabetes mellitus (HCC)   . Vocal cord dysfunction    "I've had it for many years"    Patient Active Problem List   Diagnosis Date Noted  . Irritable larynx syndrome 09/18/2015  . ILD (interstitial lung disease) (HCC) 10/22/2014  . Loss of weight 05/08/2014  . Rotator cuff  tear 01/29/2012  . Chest pain 08/26/2011  . Allergic rhinitis, cause unspecified 08/08/2011  . Obstructive sleep apnea 08/08/2011  . Chronic cough 02/18/2011  . Dyspnea 02/18/2011  . DIABETES MELLITUS, TYPE II 05/26/2007  . ANXIETY, SITUATIONAL 05/26/2007  . HYPERTENSION 05/26/2007  . Asthma, chronic 05/26/2007  . GERD 05/26/2007  . LIPID DISORDER, HX OF 05/26/2007    Past Surgical History:  Procedure Laterality Date  . ABDOMINAL ADHESION SURGERY  ~ 1978  . APPENDECTOMY  1976  . NASAL SINUS SURGERY  1992  . OOPHORECTOMY  1976   w/ovarian cyst excision and appy  . SHOULDER ARTHROSCOPY W/ ROTATOR CUFF REPAIR  01/28/12   left  . TOE SURGERY     for ingrown [id number] ?left  . TOTAL ABDOMINAL HYSTERECTOMY  1985    OB History    No data available       Home Medications    Prior to Admission medications   Medication Sig Start Date End Date Taking? Authorizing Provider  albuterol (PROVENTIL HFA;VENTOLIN HFA) 108 (90 BASE) MCG/ACT inhaler Inhale 2 puffs into the lungs every 4 (four) hours as needed for wheezing or shortness of breath.     [provider]  ALPRAZolam Prudy Feeler) 0.5 MG tablet Take 0.5 mg by mouth 2 (two) times daily as needed.     [provider]  aspirin 81 MG tablet Take 81 mg by mouth 2 (two) times daily.     [provider]  atorvastatin (LIPITOR) 20 MG tablet Take 20 mg by mouth every evening.     [provider]  Azelastine-Fluticasone (DYMISTA) 137-50 MCG/ACT SUSP Place into the nose daily.    [provider]  BELSOMRA 15 MG TABS Take 1 tablet by mouth daily. 07/16/15   Waymon Budge, MD  chlorpheniramine (CHLOR-TRIMETON) 4 MG tablet 2 tabs by mouth at bedtime    [provider]  citalopram (CELEXA) 10 MG tablet Take 1 tablet by mouth daily. 05/08/13   [provider]  cyclobenzaprine (FLEXERIL) 10 MG tablet Take 1 tablet by mouth as needed.    [provider]  gabapentin (NEURONTIN)  300 MG capsule Take 1 capsule by mouth 3 (three) times daily.  05/08/13   [provider]  HUMULIN N KWIKPEN 100 UNIT/ML Kiwkpen Inject 30 Units into the skin 2 (two) times daily.    [provider]  Linaclotide Karlene Einstein) 290 MCG CAPS capsule Take 1 capsule (290 mcg total) by mouth daily. 08/22/14   Pyrtle, Carie Caddy, MD  loratadine (CLARITIN) 10 MG tablet Take 10 mg by mouth every morning.    [provider]  losartan (COZAAR) 100 MG tablet Take 1 tablet by mouth daily.    [provider]  metFORMIN (GLUCOPHAGE) 1000 MG tablet Take 1 tablet by mouth 2 (two) times daily. 04/15/13   [provider]  methocarbamol (ROBAXIN) 500 MG tablet Take 1 tablet (500 mg total) by mouth every 8 (eight) hours as needed for muscle spasms. 11/30/16   [name], MD  metoCLOPramide (REGLAN) 5 MG/5ML solution Take 5 mLs (5 mg total) by mouth 4 (four) times daily -  before meals and at bedtime. 09/13/14   Hvozdovic, Tollie Pizza, PA-C  oxyCODONE-acetaminophen (PERCOCET/ROXICET) 5-325 MG tablet Take 1 tablet by mouth every 4 (four) hours as needed. 01/06/16   Garlon Hatchet, PA-C  pantoprazole (PROTONIX) 40 MG tablet Take 1 tablet (40 mg total) by mouth 2 (two) times daily. Patient taking differently: Take 40 mg by mouth daily.  06/17/15   Hvozdovic, Lori P, PA-C  traMADol (ULTRAM) 50 MG tablet Take 50 mg by mouth every 6 (six) hours as needed for pain.     [provider]  VICTOZA 18 MG/3ML SOPN Inject 18 mg into the skin every morning.  03/14/13   [provider]    Family History Family History  Problem Relation Age of Onset  . Heart disease Mother   . Asthma Mother   . Heart failure Maternal Grandmother   . Asthma Son   . Asthma Daughter   . Lupus Daughter   . Colon cancer Neg Hx   . Throat cancer Neg Hx   . Stomach cancer Neg Hx   . Esophageal cancer Neg Hx   . Rectal cancer Neg Hx   . Diabetes Unknown        "everybody on both sides"    Social  History Social History  Substance Use Topics  . Smoking status: Former Smoker    Packs/day: 1.00    Years: 25.00    Types: Cigarettes    Quit date: 07/13/1996  . Smokeless tobacco: Never Used  . Alcohol use No     Comment: 01/29/12 "used to abuse alcohol; last drink was in the 1980's"     Allergies   Lisinopril   Review of Systems Review of  Systems  Musculoskeletal: Positive for arthralgias.  Skin: Negative for wound.     Physical Exam Updated Vital Signs BP 128/68 (BP Location: Left Arm) Comment: Simultaneous filing. User may not have seen previous data. Comment (BP Location): Simultaneous filing. User may not have seen previous data.  Pulse 69 Comment: Simultaneous filing. User may not have seen previous data.  Temp 99.2 F (37.3 C) (Oral) Comment: Simultaneous filing. User may not have seen previous data. Comment (Src): Simultaneous filing. User may not have seen previous data.  Resp 20 Comment: Simultaneous filing. User may not have seen previous data.  SpO2 95% Comment: Simultaneous filing. User may not have seen previous data.  Physical Exam  Constitutional: She is oriented to person, place, and time. She appears well-developed and well-nourished.  HENT:  Head: Normocephalic.  Eyes: EOM are normal.  Neck: Normal range of motion.  Pulmonary/Chest: Effort normal.  Abdominal: She exhibits no distension.  Musculoskeletal: She exhibits tenderness. She exhibits no deformity.  Tenderness over right neck. Tender over trapezius and lateral neck muscles. Tender to right shoulder laterally. No elbow or wrist tenderness. Strong radial pulses. Chronic neuropathy in right hand. Good drip strength. Decreased ROM of the right shoulder due to pain. No skin changes.   Neurological: She is alert and oriented to person, place, and time.  Psychiatric: She has a normal mood and affect.  Nursing note and vitals reviewed.   ED Treatments / Results    COORDINATION OF CARE: 1:13 PM  Discussed treatment plan with pt at bedside and pt agreed to plan.   Labs (all labs ordered are listed, but only abnormal results are displayed) Labs Reviewed - No data to display  EKG  EKG Interpretation None       Radiology Dg Shoulder Right  Result Date: 11/30/2016 CLINICAL DATA:  Right shoulder pain for 1 week. EXAM: RIGHT SHOULDER - 2+ VIEW COMPARISON:  None. FINDINGS: Mild AC joint degenerative changes. There are also bony densities near the distal tip of the acromion. Possible tug lesion from the deltoid attachment or possible prior fracture. No acute bony findings. The glenohumeral joint is maintained. The right ribs appear normal and the visualized right lung is clear. IMPRESSION: Degenerative changes but no acute bony findings. Electronically Signed   By: Rudie MeyerP.  Gallerani M.D.   On: 11/30/2016 13:01    Procedures Procedures (including critical care time)  Medications Ordered in ED Medications  oxyCODONE-acetaminophen (PERCOCET/ROXICET) 5-325 MG per tablet 1 tablet (1 tablet Oral Given 11/30/16 1158)     Initial Impression / Assessment and Plan / ED Course  I have reviewed the triage vital signs and the nursing notes.  Pertinent labs & imaging results that were available during my care of the patient were reviewed by me and considered in my medical decision making (see chart for details).     Patient with shoulder pain. Began after picking a chair yesterday. Tender but overall benign exam. Likely musculoskeletal strain versus right shoulder strain. No new deficits. [facility]. Will discharge home with muscle relaxer short-term use of swelling.  Final Clinical Impressions(s) / ED Diagnoses   Final diagnoses:  Acute pain of right shoulder  Muscle spasm    New Prescriptions New Prescriptions   METHOCARBAMOL (ROBAXIN) 500 MG TABLET    Take 1 tablet (500 mg total) by mouth every 8 (eight) hours as needed for muscle spasms.   I  personally performed the services described in this documentation, which was scribed in  my presence. The recorded information has been reviewed and is accurate.      [name], MD 11/30/16 1320

## 2016-12-25 ENCOUNTER — Other Ambulatory Visit: Payer: Self-pay | Admitting: Internal Medicine

## 2016-12-25 DIAGNOSIS — Z1231 Encounter for screening mammogram for malignant neoplasm of breast: Secondary | ICD-10-CM

## 2016-12-28 DIAGNOSIS — J069 Acute upper respiratory infection, unspecified: Secondary | ICD-10-CM | POA: Diagnosis not present

## 2016-12-28 DIAGNOSIS — J310 Chronic rhinitis: Secondary | ICD-10-CM | POA: Diagnosis not present

## 2016-12-28 DIAGNOSIS — J449 Chronic obstructive pulmonary disease, unspecified: Secondary | ICD-10-CM | POA: Diagnosis not present

## 2017-01-07 ENCOUNTER — Ambulatory Visit
Admission: RE | Admit: 2017-01-07 | Discharge: 2017-01-07 | Disposition: A | Discharge status: Home / Self Care | Payer: Medicare HMO | Source: Ambulatory Visit | Attending: Internal Medicine | Admitting: Internal Medicine

## 2017-01-07 DIAGNOSIS — Z1231 Encounter for screening mammogram for malignant neoplasm of breast: Secondary | ICD-10-CM | POA: Diagnosis not present

## 2017-02-14 ENCOUNTER — Emergency Department (HOSPITAL_COMMUNITY)
Admission: EM | Admit: 2017-02-14 | Discharge: 2017-02-15 | Disposition: A | Discharge status: Home / Self Care | Payer: Medicare HMO | Attending: Emergency Medicine | Admitting: Emergency Medicine

## 2017-02-14 ENCOUNTER — Encounter (HOSPITAL_COMMUNITY): Payer: Self-pay | Admitting: Emergency Medicine

## 2017-02-14 ENCOUNTER — Emergency Department (HOSPITAL_COMMUNITY): Payer: Medicare HMO

## 2017-02-14 DIAGNOSIS — Z7984 Long term (current) use of oral hypoglycemic drugs: Secondary | ICD-10-CM | POA: Diagnosis not present

## 2017-02-14 DIAGNOSIS — I10 Essential (primary) hypertension: Secondary | ICD-10-CM | POA: Insufficient documentation

## 2017-02-14 DIAGNOSIS — Z7982 Long term (current) use of aspirin: Secondary | ICD-10-CM | POA: Insufficient documentation

## 2017-02-14 DIAGNOSIS — J40 Bronchitis, not specified as acute or chronic: Secondary | ICD-10-CM | POA: Insufficient documentation

## 2017-02-14 DIAGNOSIS — Z79899 Other long term (current) drug therapy: Secondary | ICD-10-CM | POA: Diagnosis not present

## 2017-02-14 DIAGNOSIS — J449 Chronic obstructive pulmonary disease, unspecified: Secondary | ICD-10-CM | POA: Diagnosis not present

## 2017-02-14 DIAGNOSIS — E119 Type 2 diabetes mellitus without complications: Secondary | ICD-10-CM | POA: Insufficient documentation

## 2017-02-14 DIAGNOSIS — Z87891 Personal history of nicotine dependence: Secondary | ICD-10-CM | POA: Diagnosis not present

## 2017-02-14 DIAGNOSIS — R0789 Other chest pain: Secondary | ICD-10-CM | POA: Diagnosis not present

## 2017-02-14 DIAGNOSIS — R05 Cough: Secondary | ICD-10-CM | POA: Diagnosis present

## 2017-02-14 LAB — BASIC METABOLIC PANEL
Anion gap: 8 (ref 5–15)
BUN: 9 mg/dL (ref 6–20)
CO2: 27 mmol/L (ref 22–32)
Calcium: 9.4 mg/dL (ref 8.9–10.3)
Chloride: 103 mmol/L (ref 101–111)
Creatinine, Ser: 0.88 mg/dL (ref 0.44–1.00)
GFR calc Af Amer: >60 mL/min (ref >60)
GFR calc non Af Amer: >60 mL/min (ref >60)
Glucose, Bld: 102 mg/dL — ABNORMAL HIGH (ref 65–99)
Potassium: 4 mmol/L (ref 3.5–5.1)
Sodium: 138 mmol/L (ref 135–145)

## 2017-02-14 LAB — CBC
HCT: 37.6 % (ref 36.0–46.0)
Hemoglobin: 12.1 g/dL (ref 12.0–15.0)
MCH: 26.5 pg (ref 26.0–34.0)
MCHC: 32.2 g/dL (ref 30.0–36.0)
MCV: 82.3 fL (ref 78.0–100.0)
Platelets: 269 10*3/uL (ref 150–400)
RBC: 4.57 MIL/uL (ref 3.87–5.11)
RDW: 14.3 % (ref 11.5–15.5)
WBC: 6.6 10*3/uL (ref 4.0–10.5)

## 2017-02-14 LAB — I-STAT TROPONIN, ED: Troponin i, poc: 0 ng/mL (ref 0.00–0.08)

## 2017-02-14 MED ORDER — ALBUTEROL SULFATE (2.5 MG/3ML) 0.083% IN NEBU
INHALATION_SOLUTION | RESPIRATORY_TRACT | Status: AC
  Filled 2017-02-14: qty 6

## 2017-02-14 MED ORDER — ALBUTEROL SULFATE (2.5 MG/3ML) 0.083% IN NEBU
3.0000 mg | INHALATION_SOLUTION | Freq: Once | RESPIRATORY_TRACT | Status: AC
  Administered 2017-02-14: 3 mg via RESPIRATORY_TRACT

## 2017-02-14 NOTE — ED Triage Notes (Signed)
Pt c/o sob and mid cp since last Friday, denies fever, chills on nausea, states she is having a no stop cough.

## 2017-02-14 NOTE — ED Notes (Addendum)
Labs reviewed. Explained to patient that room would be available soon. NAD noted.

## 2017-02-15 DIAGNOSIS — J40 Bronchitis, not specified as acute or chronic: Secondary | ICD-10-CM | POA: Diagnosis not present

## 2017-02-15 DIAGNOSIS — I10 Essential (primary) hypertension: Secondary | ICD-10-CM | POA: Diagnosis not present

## 2017-02-15 DIAGNOSIS — Z7984 Long term (current) use of oral hypoglycemic drugs: Secondary | ICD-10-CM | POA: Diagnosis not present

## 2017-02-15 DIAGNOSIS — J449 Chronic obstructive pulmonary disease, unspecified: Secondary | ICD-10-CM | POA: Diagnosis not present

## 2017-02-15 DIAGNOSIS — Z79899 Other long term (current) drug therapy: Secondary | ICD-10-CM | POA: Diagnosis not present

## 2017-02-15 DIAGNOSIS — Z7982 Long term (current) use of aspirin: Secondary | ICD-10-CM | POA: Diagnosis not present

## 2017-02-15 DIAGNOSIS — E119 Type 2 diabetes mellitus without complications: Secondary | ICD-10-CM | POA: Diagnosis not present

## 2017-02-15 DIAGNOSIS — Z87891 Personal history of nicotine dependence: Secondary | ICD-10-CM | POA: Diagnosis not present

## 2017-02-15 MED ORDER — ALBUTEROL SULFATE HFA 108 (90 BASE) MCG/ACT IN AERS
2.0000 | INHALATION_SPRAY | RESPIRATORY_TRACT | Status: DC | PRN
  Filled 2017-02-15: qty 6.7

## 2017-02-15 MED ORDER — AMOXICILLIN 500 MG PO CAPS: 500.0000 mg | ORAL_CAPSULE | Freq: Three times a day (TID) | ORAL | 0 refills | Status: DC

## 2017-02-15 MED ORDER — ALBUTEROL SULFATE HFA 108 (90 BASE) MCG/ACT IN AERS: 2.0000 | INHALATION_SPRAY | RESPIRATORY_TRACT | 2 refills | Status: DC | PRN

## 2017-02-15 MED ORDER — PREDNISONE 20 MG PO TABS: 40.0000 mg | ORAL_TABLET | Freq: Every day | ORAL | 0 refills | Status: DC

## 2017-02-15 MED ORDER — ALBUTEROL SULFATE (2.5 MG/3ML) 0.083% IN NEBU
2.5000 mg | INHALATION_SOLUTION | Freq: Once | RESPIRATORY_TRACT | Status: AC
  Administered 2017-02-15: 2.5 mg via RESPIRATORY_TRACT
  Filled 2017-02-15: qty 3

## 2017-02-15 MED ORDER — IPRATROPIUM-ALBUTEROL 0.5-2.5 (3) MG/3ML IN SOLN
3.0000 mL | Freq: Once | RESPIRATORY_TRACT | Status: AC
  Administered 2017-02-15: 3 mL via RESPIRATORY_TRACT
  Filled 2017-02-15: qty 3

## 2017-02-15 MED ORDER — BENZONATATE 100 MG PO CAPS: 100.0000 mg | ORAL_CAPSULE | Freq: Three times a day (TID) | ORAL | 0 refills | Status: DC

## 2017-02-15 NOTE — ED Provider Notes (Signed)
[facility] DEPT Provider Note   CSN: [id number] Arrival date & time: 02/14/17  2126     History   Chief Complaint Chief Complaint  Patient presents with  . Shortness of Breath  . Chest Pain    HPI [name] is a 61 y.o. female.  Patient presents to the emergency department for evaluation of cough, chest congestion, shortness of breath and chest discomfort. Patient reports that she started with sinus congestion several weeks ago. She has had multiple recurrent sinus infections in the past. Since then congestion has changed her chest and over the last 3 days has had progressively worsening productive cough and shortness of breath. She reports is comfortable in her chest that worsens when she coughs. She has not documented any fevers.      Past Medical History:  Diagnosis Date  . Anxiety   . Arthritis    "both shoulders"  . Asthma   . Blood transfusion without reported diagnosis   . Chronic bronchitis (HCC)   . Cold extremity without peripheral vascular disease    bilaterally; "from my knees down into my feet; they get ice cold"  . COPD (chronic obstructive pulmonary disease) (HCC)   . Environmental allergies    "year round"  . Exertional dyspnea   . GERD (gastroesophageal reflux disease)   . Headache(784.0)   . Heart murmur   . Hiatal hernia   . High cholesterol   . HTN (hypertension)   . Obesity   . Rotator cuff tear   . Sciatic nerve pain    "tailbone down into my legs when it flares up"  . Sciatica   . Sleep apnea    CPAP, sleep study on [facility]  . Type II diabetes mellitus (HCC)   . Vocal cord dysfunction    "I've had it for many years"    Patient Active Problem List   Diagnosis Date Noted  . Irritable larynx syndrome 09/18/2015  . ILD (interstitial lung disease) (HCC) 10/22/2014  . Loss of weight 05/08/2014  . Rotator cuff tear 01/29/2012  . Chest pain 08/26/2011  . Allergic rhinitis, cause unspecified 08/08/2011  . Obstructive sleep apnea  08/08/2011  . Chronic cough 02/18/2011  . Dyspnea 02/18/2011  . DIABETES MELLITUS, TYPE II 05/26/2007  . ANXIETY, SITUATIONAL 05/26/2007  . HYPERTENSION 05/26/2007  . Asthma, chronic 05/26/2007  . GERD 05/26/2007  . LIPID DISORDER, HX OF 05/26/2007    Past Surgical History:  Procedure Laterality Date  . ABDOMINAL ADHESION SURGERY  ~ 1978  . APPENDECTOMY  1976  . NASAL SINUS SURGERY  1992  . OOPHORECTOMY  1976   w/ovarian cyst excision and appy  . SHOULDER ARTHROSCOPY W/ ROTATOR CUFF REPAIR  01/28/12   left  . TOE SURGERY     for ingrown [id number] ?left  . TOTAL ABDOMINAL HYSTERECTOMY  1985    OB History    No data available       Home Medications    Prior to Admission medications   Medication Sig Start Date End Date Taking? Authorizing Provider  albuterol (PROVENTIL HFA;VENTOLIN HFA) 108 (90 BASE) MCG/ACT inhaler Inhale 2 puffs into the lungs every 4 (four) hours as needed for wheezing or shortness of breath.     [provider]  ALPRAZolam Prudy Feeler(XANAX) 0.5 MG tablet Take 0.5 mg by mouth 2 (two) times daily as needed.     [provider]  aspirin 81 MG tablet Take 81 mg by mouth 2 (two) times daily.  [provider]  atorvastatin (LIPITOR) 20 MG tablet Take 20 mg by mouth every evening.     [provider]  Azelastine-Fluticasone (DYMISTA) 137-50 MCG/ACT SUSP Place into the nose daily.    [provider]  BELSOMRA 15 MG TABS Take 1 tablet by mouth daily. 07/16/15   Waymon Budge, MD  chlorpheniramine (CHLOR-TRIMETON) 4 MG tablet 2 tabs by mouth at bedtime    [provider]  citalopram (CELEXA) 10 MG tablet Take 1 tablet by mouth daily. 05/08/13   [provider]  cyclobenzaprine (FLEXERIL) 10 MG tablet Take 1 tablet by mouth as needed.    [provider]  gabapentin (NEURONTIN) 300 MG capsule Take 1 capsule by mouth 3 (three) times daily.  05/08/13   [provider]  HUMULIN N KWIKPEN 100  UNIT/ML Kiwkpen Inject 30 Units into the skin 2 (two) times daily.    [provider]  Linaclotide Karlene Einstein) 290 MCG CAPS capsule Take 1 capsule (290 mcg total) by mouth daily. 08/22/14   Pyrtle, Carie Caddy, MD  loratadine (CLARITIN) 10 MG tablet Take 10 mg by mouth every morning.    [provider]  losartan (COZAAR) 100 MG tablet Take 1 tablet by mouth daily.    [provider]  metFORMIN (GLUCOPHAGE) 1000 MG tablet Take 1 tablet by mouth 2 (two) times daily. 04/15/13   [provider]  methocarbamol (ROBAXIN) 500 MG tablet Take 1 tablet (500 mg total) by mouth every 8 (eight) hours as needed for muscle spasms. 11/30/16   [name], MD  metoCLOPramide (REGLAN) 5 MG/5ML solution Take 5 mLs (5 mg total) by mouth 4 (four) times daily -  before meals and at bedtime. 09/13/14   Hvozdovic, Tollie Pizza, PA-C  oxyCODONE-acetaminophen (PERCOCET/ROXICET) 5-325 MG tablet Take 1 tablet by mouth every 4 (four) hours as needed. 01/06/16   Garlon Hatchet, PA-C  pantoprazole (PROTONIX) 40 MG tablet Take 1 tablet (40 mg total) by mouth 2 (two) times daily. Patient taking differently: Take 40 mg by mouth daily.  06/17/15   Hvozdovic, Lori P, PA-C  traMADol (ULTRAM) 50 MG tablet Take 50 mg by mouth every 6 (six) hours as needed for pain.     [provider]  VICTOZA 18 MG/3ML SOPN Inject 18 mg into the skin every morning.  03/14/13   [provider]    Family History Family History  Problem Relation Age of Onset  . Heart disease Mother   . Asthma Mother   . Heart failure Maternal Grandmother   . Asthma Son   . Asthma Daughter   . Lupus Daughter   . Diabetes Unknown        "everybody on both sides"  . Colon cancer Neg Hx   . Throat cancer Neg Hx   . Stomach cancer Neg Hx   . Esophageal cancer Neg Hx   . Rectal cancer Neg Hx     Social History Social History  Substance Use Topics  . Smoking status: Former Smoker    Packs/day: 1.00    Years: 25.00     Types: Cigarettes    Quit date: 07/13/1996  . Smokeless tobacco: Never Used  . Alcohol use No     Comment: 01/29/12 "used to abuse alcohol; last drink was in the 1980's"     Allergies   Lisinopril   Review of Systems Review of Systems  HENT: Positive for congestion.   Respiratory: Positive for cough and shortness of breath.  Cardiovascular: Positive for chest pain.  All other systems reviewed and are negative.    Physical Exam Updated Vital Signs BP 114/71 (BP Location: Left Arm)   Pulse 63   Temp 99 F (37.2 C) (Oral)   Resp 16   Ht 5\' 7"  (1.702 m)   Wt 100.7 kg (222 lb)   SpO2 99%   BMI 34.77 kg/m   Physical Exam  Constitutional: She is oriented to person, place, and time. She appears well-developed and well-nourished. No distress.  HENT:  Head: Normocephalic and atraumatic.  Right Ear: Hearing normal.  Left Ear: Hearing normal.  Nose: Nose normal.  Mouth/Throat: Oropharynx is clear and moist and mucous membranes are normal.  Eyes: Pupils are equal, round, and reactive to light. Conjunctivae and EOM are normal.  Neck: Normal range of motion. Neck supple.  Cardiovascular: Regular rhythm, S1 normal and S2 normal.  Exam reveals no gallop and no friction rub.   No murmur heard. Pulmonary/Chest: Effort normal. No respiratory distress. She has decreased breath sounds. She has wheezes. She exhibits no tenderness.  Abdominal: Soft. Normal appearance and bowel sounds are normal. There is no hepatosplenomegaly. There is no tenderness. There is no rebound, no guarding, no tenderness at McBurney's point and negative Murphy's sign. No hernia.  Musculoskeletal: Normal range of motion.  Neurological: She is alert and oriented to person, place, and time. She has normal strength. No cranial nerve deficit or sensory deficit. Coordination normal. GCS eye subscore is 4. GCS verbal subscore is 5. GCS motor subscore is 6.  Skin: Skin is warm, dry and intact. No rash noted. No cyanosis.    Psychiatric: She has a normal mood and affect. Her speech is normal and behavior is normal. Thought content normal.  Nursing note and vitals reviewed.    ED Treatments / Results  Labs (all labs ordered are listed, but only abnormal results are displayed) Labs Reviewed  BASIC METABOLIC PANEL - Abnormal; Notable for the following:       Result Value   Glucose, Bld 102 (*)    All other components within normal limits  CBC  I-STAT TROPONIN, ED    EKG  EKG Interpretation  Date/Time:  Sunday February 14 2017 21:37:09 EDT Ventricular Rate:  68 PR Interval:  156 QRS Duration: 76 QT Interval:  410 QTC Calculation: 435 R Axis:   73 Text Interpretation:  Normal sinus rhythm Normal ECG Confirmed by Gilda Crease [phone]) on 02/15/2017 12:34:24 AM       Radiology Dg Chest 2 View  Result Date: 02/14/2017 CLINICAL DATA:  Acute onset of mid chest tightness. Initial encounter. EXAM: CHEST  2 VIEW COMPARISON:  Chest radiograph performed 09/01/2015, and CT of the chest performed 06/09/2016 FINDINGS: The lungs are well-aerated. Mild vascular congestion is noted. There is no evidence of pleural effusion or pneumothorax. The heart is normal in size; the mediastinal contour is within normal limits. No acute osseous abnormalities are seen. IMPRESSION: Mild vascular congestion noted.  Lungs remain grossly clear. Electronically Signed   By: Roanna Raider M.D.   On: 02/14/2017 22:08    Procedures Procedures (including critical care time)  Medications Ordered in ED Medications  ipratropium-albuterol (DUONEB) 0.5-2.5 (3) MG/3ML nebulizer solution 3 mL (not administered)  albuterol (PROVENTIL) (2.5 MG/3ML) 0.083% nebulizer solution 2.5 mg (not administered)  albuterol (PROVENTIL) (2.5 MG/3ML) 0.083% nebulizer solution 3 mg (3 mg Nebulization Given 02/14/17 2137)     Initial Impression / Assessment and Plan / ED Course  I  have reviewed the triage vital signs and the nursing notes.  Pertinent  labs & imaging results that were available during my care of the patient were reviewed by me and considered in my medical decision making (see chart for details).     Patient presents with three-day history of chest discomfort and pain in the setting of upper respiratory infection symptoms. Symptoms are very atypical for cardiac etiology. EKG is normal. Troponin negative. Patient exhibiting significant bronchospasm here in the ER. She has been given serial nebulizer treatments with improvement of her breathing and wheezing as well as all symptoms. She is not hypoxic, will be appropriate for outpatient management of bronchitis with bronchospasm.  Final Clinical Impressions(s) / ED Diagnoses   Final diagnoses:  Bronchitis    New Prescriptions New Prescriptions   No medications on file     Gilda Crease, MD 02/15/17 [phone]

## 2017-02-15 NOTE — Progress Notes (Signed)
This note also relates to the following rows which could not be included: SpO2 - Cannot attach notes to unvalidated device data  Given by RN 

## 2017-02-15 NOTE — ED Notes (Signed)
Pt reports she feels "much better" and asked for food.

## 2017-02-15 NOTE — Progress Notes (Signed)
Tx given by RN

## 2017-02-16 ENCOUNTER — Ambulatory Visit (INDEPENDENT_AMBULATORY_CARE_PROVIDER_SITE_OTHER): Payer: Medicare HMO | Admitting: Internal Medicine

## 2017-02-16 ENCOUNTER — Encounter: Payer: Self-pay | Admitting: Internal Medicine

## 2017-02-16 VITALS — BP 112/62 | HR 67 | Ht 67.0 in | Wt 213.0 lb

## 2017-02-16 DIAGNOSIS — J387 Other diseases of larynx: Secondary | ICD-10-CM

## 2017-02-16 DIAGNOSIS — J849 Interstitial pulmonary disease, unspecified: Secondary | ICD-10-CM | POA: Diagnosis not present

## 2017-02-16 DIAGNOSIS — J209 Acute bronchitis, unspecified: Secondary | ICD-10-CM | POA: Diagnosis not present

## 2017-02-16 MED ORDER — PREDNISONE 10 MG PO TABS: ORAL_TABLET | ORAL | 0 refills | Status: DC

## 2017-02-16 NOTE — Addendum Note (Signed)
Addended by: Sheran LuzEAST, Maxi Rodas K on: 02/16/2017 12:08 PM   Modules accepted: Orders

## 2017-02-16 NOTE — Progress Notes (Signed)
Subjective:     Patient ID: [name], female   DOB: [birth year], 61 y.o.   MRN: [id number]  HPI    OV 02/17/2011. 61 year old female. Obese. AA female. Remote smoker On cpap for OSA (pmd managing). REferred by Dr Delfina Redwood.    New visit for chronic cough. Preesent for many years. Insidious onset.  Progressively with more frequency past few years. Cough rated as moderate. Usually dry cough with associated barking quality. Occ mucus present; thick and clear. Cough worsened by milk products, change in weather and dust. Hx of prior ENT eval in [place] > 15 years ago: diagnosed as "vocal cords sticking together" and remote hx of sinus surgery Also diagnosed with GERD in [place] > 15 years ago; takes protonix.  [facility] Reflux Symptom INdex score is 39 and very c/w LPR cough(severe hoarsness of voice, clearing of throat, ecess throat mucus, post nasal drip, coughing after lying down or eating, choking epidoses, sensation of globus with food sticking in thorat) and associated heart burn. Cough so severe that she could not complete spirometry in office today  Has dyspnea too: few years, insidious onset. Progressive. Rates it as moderate. Dyspnea brought on by associated chest tightness and nasal drainage or even exertion like climbing flight of steps. Dyspnea relieved by rest. Cough and dyspnea associated with wheezing and constant yellow sinus drainage as well. Frequent nocturnal awakenings with choking present. Above resulted in asthma diagnosis versus tracheobronchiolotis > 15 years ago: on advair since then which she is unsure is helping. She herself is not convinced she has asthma.   Associated med intake shows tramadol (for shoulder pain and sicatica), fish oil/ flaxseed (2 years) and ACE inhibitor intake (been on lisniopril for > 10 years due to diabetes)   Pas med hx review fromn outside record notes "hx of asthma and vocal cord dysfunction and post nasal drip. Recalls frequent ER visits atleast one  per month (last er visit 2 months ago per hx) to [place]. Denies ICU admits, intubations for same.   REC Cough is from ACE Inhibitor, sinus drainage, vocal cord dysfunction, acid reflux and possible cough all conspiring to cause cyclical cough/LPR cough #Sinus drainage  - continue nasal steroid - refer to [facility] ENT Dr. Wilburn Cornelia or Dr Redmond Baseman #Possible Acid Reflux  - continue protonix   - take diet sheet from [place] - avoid colas, spices, cheeses, spirits, red meats, beer, chocolates, fried foods etc.,   - sleep with head end of bed elevated  - eat small frequent meals  - do not go to bed for 3 hours after last meal - stop fish oil and flaxseed for now #Asthma   - continue advair for now - might have to consider changing over to something else later depending on ENT evaluation #Vocal Cord Dysfunction - see ENT doctor first  -later  will consider referral to speech therapy with [name] #BP  - stop lisinopril - take benicar 1 tablet daily - take sample, nurse will add it in med list #Followup - I will see you in 4-6 weeks.  - Reattempt spirometry with [name] at time of followup -Important you comply with advice 100% correctly   OV 03/31/11: Followup cough. SAw ENT 89/17./12: no anatomic path or VCD noticed at time of exam though patient noted to have choked.  She states that she was not happy with her experience at ENT visit and does not want to visit the same office again. She is compliant  with instructions except for diet though she has made improvements there. Off lisniopril. Follows sinus, gerd and astham advice. Does not like advair diskus. Overall cough is much better. She is happy with improvement. RSI cough score dropped from 39 to 14.  She sses her hoarsenss, clearing and pos nasal drip as mild-moderate  PFTS show no more flow volume loop issues. There is restriction with mixed obstruction. DLCO 67%   REC Cough is from sinus drainage, vocal cord dysfunction,  acid reflux and possible cough all conspiring to cause cyclical cough/LPR cough  #Sinus drainage  - continue nasal steroid - nurse will do script for generic fluticasone -  #Acid Reflux  - continue protonix   - take diet sheet from [place] [facility] - avoid colas, spices, cheeses, spirits, red meats, beer, chocolates, fried foods etc.,   - sleep with head end of bed elevated  - eat small frequent meals  - do not go to bed for 3 hours after last meal - cotninue to avoid fish oil and flaxseed   #Asthma   - continue advair but nurse will give you sample of HFA and teach you how to take it with spacer; she will do script as well  #Vocal Cord Dysfunction - referral to speech therapy with [name]  #BP  - will add lisinopril to allergy list - take benicar 1 tablet daily or the equivalent Dr Delfina Redwood gives you  #Followup - I will see you in 8 weeks.  - -Important you comply with advice 100% correctly - Glad you are much better - Flu shot today please   OV 06/26/2011 Followup for chronic cough. She continues to do better. Subjectively feels much better compared to last visit. Although objectively her cough score has only reduced by 1 point. Today, RSI cough score is 13. Level III postnasal drip. Level to hoarseness of voice, cough after lying down, choking episodes, and annoying cough. Level I sensation of lump in throat and heartburn.  In talking to her she is compliant with her nasal steroid and her Protonix for acid reflux but not compliant with diet for acid reflux. She is on Advair HFA with spacer for presumed asthma and she feels this makes her cough less compared to Advair discus. We discussed more about her asthma history and she says that she was never sure she had asthma diagnosis in [place] it was a presumptive diagnosis. She is eager to come off Advair and give a trial without it. She also gives a history of allergies and allergy shots while living in [place] and she is open to an  allergy evaluation right now. In terms of cyclical cough she has finished speech therapy evaluation and graduated from the program. Overall she feels quality-of-life is acceptable although she would like to see cough improve further. So far we have not tried Neurontin    Past, Family, Social reviewed: no change since last visit except that Dr Deforest Hoyles is new PMD. She now states she has year round allergies. Recollects while in [place] used to take allergy shots twice a week 10 years ago. Since moving to [place] 4 years ago not seen an allergist. Laverle Hobby to see allergist. She feels she does not have asthma.  Cough is from sinus drainage, vocal cord dysfunction, acid reflux and possible asthma or allergiesh all working together to cause cyclical cough/LPR cough  #Sinus drainage  - continue nasal steroid - nurse will do script for generic fluticasone  -  #Acid Reflux  -  continue protonix  - contniue (REALLY IMPORANT IS DIET) diet sheet from [place] [facility] - avoid colas, spices, cheeses, spirits, red meats, beer, chocolates, fried foods etc.,  - sleep with head end of bed elevated  - eat small frequent meals  - do not go to bed for 3 hours after last meal  - cotninue to avoid fish oil and flaxseed  #Asthma  - since diagnosis of asthma was in doubt. Please stop Advair and just use albuterol as needed  -We'll do spirometry at followup  # allergies  - Please see Dr. Baird Lyons in our office for allergy evaluation; will do referral  #Vocal Cord Dysfunction  - congratulations on graduating from program with speech therapy [name]  #BP  - Continue losartan  #Followup  - I will see you in 8 weeks.  - -Important you comply with advice 100% correctly  - Glad you are much better  - Cough score worksheet at followup   OV 08/25/2011 Followup for chronic cough. She is off advair as advised. She is doing gerd control.  Ojectively her cough score is not reduced. Last visit score was 13 but now it is 14.  (Level III postnasal drip, and post nasal drip. . Level 2  hoarseness of voice and choking episodes. Level 1 - cough after lying down,and  sensation of lump in throat and heartburn). Howevefr, subjectively cough much improved. Still with post nasal drip. Has seen Dr Annamaria Boots for allergy 08/06/11 and 08/19/11. RAST serum profile negative except for elevated [place] IGE. Allergy skin test pending 09/16/11. On visit 08/19/11 to Dr Annamaria Boots had atypical chest pain - releived with ppi in office. EKG okay. Trop was normal. . Of note, Dr Annamaria Boots is also addressing her prior OSA issues.   ROS c/o anxiety, insomnia, also chest pains  - atypical  Spirometry  - fev1 1.74L/72%. Ratio 78    #Chest pains  - please see [facility] cardiology asap - > equivocal stress test and but clinically low probability February 2013; placed in clinical followup #Cough  - for sinus: follow Dr Annamaria Boots recommendation  - for acid reflux: follow medications and diet  - for possible asthma: stay off advair and once cardiology ok, please have methacholine challenge test  #Followup  - complete cardiology visit and methacholine challenge  - 4-6 weeks from now  09/16/11 with Dr Annamaria Boots Allergist- 30 yoF former smoker referred by Dr Chase Caller who has been seeing her for multifactorial cough, questioning an allergic component At last visit she had presented with chest pain, preventing planned allergy skin testing. Now following with cardiology and pending CT scan of her heart/Dr. Burt Knack. Still complains of nasal congestion. Able to use her CPAP more regularly when her nose is not stopped up. Unable to do methacholine challenge last month because her baseline is from a tree scores were too low. CPAP autotitration done/[facility]. Pending download. PFT- 09/11/11- FVC 2.15/68%, FEV1 1.59/ 64%, FEV1/FVC 0.80. FEF 25-75 % 0.37/ 15%   OV 03/21/2013 Followup chronic cough  - I have not seen her in nearly 15 months. She says that basically her cough resolved but  cough is no longer chronic and only occurs intermittently when she has "bronchitis". In the last year she's had antibiotics and prednisone for acute episodes of coughing at least 3 times. In between episodes she is asymptomatic. Currently she feels she's having one such episode in the past 3 weeks with chest congestion, cough with white mucus, fatigue and associated wheeze. Currently  RSI cough score is 21 and show severe cough.  - Of note, she has not had methacholine challenge test that I advised a year and half ago. She says that she does not have vocal cord dysfunction based on Dr. Mickie Hillier exam in August 2012 ENT physician   REC levaquin and pred burst  04/20/2013 Follow up and Med review  new med calendar - pt brought all meds with her today.  does report some sinus pressure, congestion, SOB, wheezing, chest tightness. Is feeling better but still gets into coughing fits that takes her a while to calm down. Strong odors seem to causes attacks along with laughing  She did have an attack in the office. Sats were normal however she has significant upper airway psuedowheezing w/ barking cough.  No fever , chest pain, over reflux , orthopnea or edema.  No using CPAP lately   REC top Advair . Add Chlor trimeton 92m 2 At bedtime   Change Loratadine 161min am.  Add Pepcid 2099mt bedtime   Avoid throat clearing , use sips of water.  Saline nasal rinses Twice daily  .  NO MINT PRODUCTS  GERD diet .  We are setting you up for a CT neck .  Restart CPAP At bedtime   follow up Dr. RamChase Caller 4 weeks.  Please contact office for sooner follow up if symptoms do not improve or worsen or seek emergency care    OV  05/26/2013 Chief Complaint  Patient presents with  . Follow-up    Pt c/o chest tightness, dry cough, wheezing and sob. Pt states this was some better while she was on prednisone. Last dose x 3 weeks ago.   Followup for chronic cough. She says her last 2-3 weeks that she's having  an acute bronchitis flare with yellow phlegm. She feels she needs antibiotics and prednisone. In fact in the time that she last saw me in September 2014 she's had another round of antibiotics and prednisone with her primary care physician Dr. HusDeforest Hoyleshe feels her chest is congested although in the office she is clearly having a barking cough, laryngeal spasm and upper airway noise intermittently with changes in voice. It is fairly significant. I reminded her that this is consistent with vocal cord dysfunction but she said that her ear nose throat surgeon cleared her for the same. Of note, she had a CT scan of the neck and this was normal.  07/26/2013 Follow up  2 month follow up - reports some  congestion, head congestion w/ light yellow mucus that is on/off. Has sinus drainage daily . Is not using claritin or chlortrimeton as recommended.  Was taken off ACE in past , now on Cozaar.   RSI cough score unchanged at 21  Had Esophagram on 1/8 w/ mild GERD/HH. No sign delay emptying .  No chest pain, orthopnea, edema , fever.  +hoarseness on/off.   She says she feels so much better with CPAP , using during daytime for naps and At bedtime  . Is amazed at how much she feels better.    OV 03/20/2014  Chief Complaint  Patient presents with  . Follow-up    Pt states she is unable to hold her voice as long when she sings. Pt c/o PND and prod cough, pt thinks its Miles/t allergies. Pt c/o chest soreness on right upper chest.    Followup chronic cough  - I have not seen the patient in nearly 10 months. Overall cough  was stable but recently she has had a flareup. I'm not so sure that she is followed instructions compliantly. It took me a while to understand the medication regimen but it appears that she takes dymista for  Sinuses but saline wash as needed, and not taking claritin, Advair as needed for her lungs, Protonix schedule followup acid reflux but Pepcid only as needed. She is only somewhat compliant with  the Neurontin. She continues to have sinus drainage, clearing of the throat and the laryngeal/barking cough that she feels is coming from the throat. This is associated with dyspnea and is of moderate severity, and some wheezing but she feels wheeze is upper airway  REC #Chronic cough  - I thnk cough is related to voice box issues and loss of neural control of the same - Please take prednisone 40 mg x1 day, then 30 mg x1 day, then 20 mg x1 day, then 10 mg x1 day, and then 5 mg x1 day and stop - refer Dr Ernestine Conrad of [facility] for chronic cough - for now continue current nasal, acid reflux and neurontin therapies - continue albuterol as needed ; for now hold off scheduled advair - repeat HRCT chest wo contrast for chronic cough  #FOllowup  3 months - after you see Dr Joya Gaskins of [facility]  Come sooner if there are problems o  OV 10/22/2014  Chief Complaint  Patient presents with  . Follow-up    Pt stated her cough has improved. Pt c/o hoarseness and chest tightness. Pt is wearing CPAP nightly and denies any issues with machine, mask or pressure.     FU chronic cough  - IN interim in Sept 2015 had CT chest : CT chest 03/26/14 shows worsening infiltreats in bases compaed to nov 2014. COncern for aspiration and referred  To GI. She does not know details of GI Visit but review of records shows she has gastroparesis and hs been started on reglan. Also she saw Dr Joya Gaskins of ENT in [place]. WIth above measure cough has resolved. /nearly resolved. No respiratory issues.   reports that she quit smoking about 18 years ago. Her smoking use included Cigarettes. She has a 25 pack-year smoking history. She has never used smokeless tobacco.   OV 11/12/2014  Chief Complaint  Patient presents with  . Follow-up    Pt here after PFT. Pt stated her breathing has slightly improved,she is less SOB. Pt stated her cough has also imrpoved. Pt has not yet had autoimmune panel.    Follow-up  chronic cough with interstitial lung disease findings  As of last visit her cough resolved. Cough was related to aspiration. As soon as she started following gastroenterology advice cough resolved. However follow-up CT scan of the chest in April 2016 compared to fall 2015 showed persistent ILD findings as described below [personally reviewed image]. At this point she is asymptomatic. I had her do pulmonary function test and autoimmune workup and reports for follow-up. Of these she only completed pulmonary function test that shows restriction with slight reduction in diffusion capacity [some of the restriction can be due to obesity]. She continues to be asymptomatic. She's not having reflux symptoms anymore. Of note, she forgot to have a autoimmune test. She does admit that her daughter has lupus   IMPRESSION: CT chest 10/29/2014 personally reviewed image  1. Stable findings of peribronchovascular ground-glass attenuation, predominantly in the basal portions of the lungs bilaterally, again favored to reflect nonspecific interstitial pneumonia (NSIP). 2. Mild  air trapping, indicative of mild small airways disease. Electronically Signed  By: Vinnie Langton M.Miles.  On: 10/29/2014 16:19  Pulmonary function test 10/31/2014 shows restriction with reduced diffusion capacity. FVC 2.1 L/69%, FEV1 1.8 L/75% and ratio of 86. No broncho-dilator response. Total lung capacity 3.5 L/63% and DLCO 20.0/70% Sleep Apnea FOLLOWS FOR: pt wearing cpap nightly, c/o mask getting hot sometimes qhs.  has trouble staying asleep.     OV 06/10/2015  Chief Complaint  Patient presents with  . Follow-up    Pt states she has been getting more frequent sinus infections, slight increase in SOB, chest heaviness. Pt here after HRCT. Pt wearing CPAP nightly.    Follow-up multifactorial chronic cough Follow-up mild basal interstitial lung disease not otherwise specified  She currently reports that she is not having much  cough although after I left the office I heard a cough and periodically she did have variable upper airway noise. She continues to have gastroparesis related acid reflux particularly when she eats food she regurgitates. She last saw GI March 2016 and was asked to increase and metoclopramide. She's not having much dyspnea. Overall she feels okay. She is having some atypical chest pain going on for a few months in the right supramammary area   07/16/2015-61 year old female former smoker followed by Dr. Chase Caller for mild ILD, recurrent sinus infections, chronic cough, Sleep Apnea FOLLOWS FOR: pt wearing cpap nightly, c/o mask getting hot sometimes qhs.  has trouble staying asleep.   Here for CPAP follow-up. "Loves it" and says she is wearing it all night every night. Fullface mask, [facility]/12.  Not noticing active respiratory problems currently with little cough    OV  09/18/2015  Chief Complaint  Patient presents with  . Follow-up    pt recently seen at [facility] ED for URI- pt c/o prod cough with thick clear mucus, SOB with exertion, wheezing.     Follow-up chronic cough which is multifactorial with a strong component of irritable larynx syndrome, vocal cord dysfunction and gastroparesis related to diabetes  Follow-up interstitial lung disease probably as a result of above but etiology uncertain   I last saw her in November 2016. She was supposed to follow-up with me in November 2017. In December 2016 I did make her see GI. They did a swallow study. I reviewed those results and she did not aspirate but coughed significantly during swallowing. No specific interventions have been recommended. It appears she has increase and metoclopramide. Then 09/01/2015 ended up in the ER with shortness of breath and wheezing episode and was given antibiotics and prednisone according to her history. Now she significantly improved and close to baseline. She does have some wheezing but she says it's all from the upper  airway. She does not want another course of anabiotic's of prednisone. She is on Neurontin but this is not helping. Her creatinine was 1 mg percent troponin was normal and hemoglobin was normal 09/01/2015. She had chest x-ray 09/01/2015 and is clear per report. I did not visualize this film.  In terms of her vocal cord dysfunction review of my prior notes shows that she is seen ENT multiple times. Most recently she saw [facility] E[facility] Dr. Joya Gaskins and she had resolution but now the cough due to irritable larynx has come back. She says she cannot go back to [facility] because of insurance reasons. She is willing to reestablish with the ENT locally.   OV 06/15/2016  Chief Complaint  Patient presents with  . Follow-up  Pt states she is waking every 2 hours because she is SOB, pt is still wearing CPAP. Pt c/o prod cough with clear mucus. Pt states she just completed a pred taper and abx for URI.      Follow-up multifactorial chronic cough Follow-up mild basal interstitial lung disease not otherwise specified   Obese female with significant vocal cord dysfunction/irritable larynx syndrome returns for follow-up. Last seen March 2017. She had follow-up high-resolution CT chest 06/09/2016 that I personally visualized. The findings show extremely subtle presence of interstitial lung disease all stable since 2014. In terms of symptoms of cough she is overall stable. However she tells me she has significant episodes of dyspnea at night when she wears a CPAP. Here in the office when asked her to open her mouth she made it given the significant upper airway coughing spell. She also complains of dyspnea on exertion.   OV 08/14/2016  Chief Complaint  Patient presents with  . Follow-up    Was told to follow up after stress test. Stress test was done back in Dec 2017. Breathing has ok for the past few weeks, had a minor URI.      61 y.o. Marland Kitchen.[name] presents for fu of CPST results in 07/01/16.  Reviewed terst results with her. Somewhat submaximal of RER 0.96. Audible wheezing prior to start of test. No EIB. Has vo2 max that went from low normal to normal when correced for IBW. Not clear if there was diast dysfn. Got dyspneic  OV 02/16/2017  Chief Complaint  Patient presents with  . Follow-up    Pt states she thinks her breathing has recently worsened Miles/t bronchitis. Pt states she is taking pred. Pt c/o non prod cough, chest discomfort from coughing, chest congestion. Pt deines f/c/s.     Follow-up multifactorial chronic cough with vcd Follow-up mild basal interstitial lung disease not otherwise specified   This is a routine follow-up for the above issues but 2 days ago she ended up in the ER because of sinus drainage bronchospasm. According to her history she was discharged with 10 days of amoxicillin and 5 days of prednisone. She is much improved although she still complaining of chest tightness in throat tightness and cough. She also some increased wheezing than baseline but all improving. There are no other new issues. Labs and chest x-ray from the ER visit review reviewed and documented below.  Results for [name], [name] (MRN [id number]) as of 02/16/2017 11:50  Ref. Range 02/14/2017 21:37 02/14/2017 21:55  Troponin i, poc Latest Ref Range: 0.00 - 0.08 ng/mL  0.00   Results for [name], [name] (MRN [id number]) as of 02/16/2017 11:50  Ref. Range 02/14/2017 21:37 02/14/2017 21:55  Creatinine Latest Ref Range: 0.44 - 1.00 mg/dL 7.820.88   Results for [name], [name] (MRN [id number]) as of 02/16/2017 11:50  Ref. Range 02/14/2017 21:37 02/14/2017 21:55  Hemoglobin Latest Ref Range: 12.0 - 15.0 g/dL 57.812.1      has a past medical history of Anxiety; Arthritis; Asthma; Blood transfusion without reported diagnosis; Chronic bronchitis (HCC); Cold extremity without peripheral vascular disease; COPD (chronic obstructive pulmonary disease) (HCC); Environmental allergies; Exertional dyspnea; GERD (gastroesophageal  reflux disease); Headache(784.0); Heart murmur; Hiatal hernia; High cholesterol; HTN (hypertension); Obesity; Rotator cuff tear; Sciatic nerve pain; Sciatica; Sleep apnea; Type II diabetes mellitus (HCC); and Vocal cord dysfunction.   reports that she quit smoking about 20 years ago. Her smoking use included Cigarettes. She has a 25.00 pack-year smoking history. She has  never used smokeless tobacco.  Past Surgical History:  Procedure Laterality Date  . ABDOMINAL ADHESION SURGERY  ~ 1978  . APPENDECTOMY  1976  . NASAL SINUS SURGERY  1992  . OOPHORECTOMY  1976   w/ovarian cyst excision and appy  . SHOULDER ARTHROSCOPY W/ ROTATOR CUFF REPAIR  01/28/12   left  . TOE SURGERY     for ingrown [id number] ?left  . TOTAL ABDOMINAL HYSTERECTOMY  1985    Allergies  Allergen Reactions  . Lisinopril Cough    Immunization History  Administered Date(s) Administered  . Influenza Split 03/31/2011, 05/19/2013, 03/13/2014, 05/16/2015  . Influenza,inj,Quad PF,36+ Mos 03/13/2016  . Pneumococcal Polysaccharide-23 07/14/2001  . Td 07/15/1998  . Tdap 10/11/2016  . Zoster 11/10/2016    Family History  Problem Relation Age of Onset  . Heart disease Mother   . Asthma Mother   . Heart failure Maternal Grandmother   . Asthma Son   . Asthma Daughter   . Lupus Daughter   . Diabetes Unknown        "everybody on both sides"  . Colon cancer Neg Hx   . Throat cancer Neg Hx   . Stomach cancer Neg Hx   . Esophageal cancer Neg Hx   . Rectal cancer Neg Hx      Current Outpatient Prescriptions:  .  albuterol (PROVENTIL HFA;VENTOLIN HFA) 108 (90 BASE) MCG/ACT inhaler, Inhale 2 puffs into the lungs every 4 (four) hours as needed for wheezing or shortness of breath. , Disp: , Rfl:  .  albuterol (PROVENTIL HFA;VENTOLIN HFA) 108 (90 Base) MCG/ACT inhaler, Inhale 2 puffs into the lungs every 4 (four) hours as needed for wheezing or shortness of breath., Disp: 1 Inhaler, Rfl: 2 .  ALPRAZolam (XANAX) 0.5 MG  tablet, Take 0.5 mg by mouth 2 (two) times daily as needed. , Disp: , Rfl:  .  amoxicillin (AMOXIL) 500 MG capsule, Take 1 capsule (500 mg total) by mouth 3 (three) times daily., Disp: 30 capsule, Rfl: 0 .  aspirin 81 MG tablet, Take 81 mg by mouth 2 (two) times daily. , Disp: , Rfl:  .  atorvastatin (LIPITOR) 20 MG tablet, Take 20 mg by mouth every evening. , Disp: , Rfl:  .  Azelastine-Fluticasone (DYMISTA) 137-50 MCG/ACT SUSP, Place into the nose daily., Disp: , Rfl:  .  chlorpheniramine (CHLOR-TRIMETON) 4 MG tablet, 2 tabs by mouth at bedtime, Disp: , Rfl:  .  citalopram (CELEXA) 10 MG tablet, Take 1 tablet by mouth daily., Disp: , Rfl:  .  cyclobenzaprine (FLEXERIL) 10 MG tablet, Take 1 tablet by mouth as needed., Disp: , Rfl:  .  gabapentin (NEURONTIN) 300 MG capsule, Take 1 capsule by mouth 3 (three) times daily. , Disp: , Rfl:  .  loratadine (CLARITIN) 10 MG tablet, Take 10 mg by mouth every morning., Disp: , Rfl:  .  losartan (COZAAR) 100 MG tablet, Take 1 tablet by mouth daily., Disp: , Rfl:  .  metFORMIN (GLUCOPHAGE) 1000 MG tablet, Take 1 tablet by mouth 2 (two) times daily., Disp: , Rfl:  .  methocarbamol (ROBAXIN) 500 MG tablet, Take 1 tablet (500 mg total) by mouth every 8 (eight) hours as needed for muscle spasms., Disp: 10 tablet, Rfl: 0 .  metoCLOPramide (REGLAN) 5 MG/5ML solution, Take 5 mLs (5 mg total) by mouth 4 (four) times daily -  before meals and at bedtime., Disp: 600 mL, Rfl: 1 .  pantoprazole (PROTONIX) 40 MG tablet, Take 1 tablet (40 mg  total) by mouth 2 (two) times daily. (Patient taking differently: Take 40 mg by mouth daily. ), Disp: 60 tablet, Rfl: 3 .  predniSONE (DELTASONE) 20 MG tablet, Take 2 tablets (40 mg total) by mouth daily with breakfast., Disp: 10 tablet, Rfl: 0 .  traMADol (ULTRAM) 50 MG tablet, Take 50 mg by mouth every 6 (six) hours as needed for pain. , Disp: , Rfl:  .  VICTOZA 18 MG/3ML SOPN, Inject 18 mg into the skin every morning. , Disp: , Rfl:   .  benzonatate (TESSALON) 100 MG capsule, Take 1 capsule (100 mg total) by mouth every 8 (eight) hours. (Patient not taking: Reported on 02/16/2017), Disp: 21 capsule, Rfl: 0 .  HUMULIN N KWIKPEN 100 UNIT/ML Kiwkpen, Inject 30 Units into the skin 2 (two) times daily., Disp: , Rfl:    Review of Systems     Objective:   Physical Exam  Constitutional: She is oriented to person, place, and time. She appears well-developed and well-nourished. No distress.  Obese female  HENT:  Head: Normocephalic and atraumatic.  Right Ear: External ear normal.  Left Ear: External ear normal.  Mouth/Throat: Oropharynx is clear and moist. No oropharyngeal exudate.  Eyes: Pupils are equal, round, and reactive to light. Conjunctivae and EOM are normal. Right eye exhibits no discharge. Left eye exhibits no discharge. No scleral icterus.  Neck: Normal range of motion. Neck supple. No JVD present. No tracheal deviation present. No thyromegaly present.  Cardiovascular: Normal rate, regular rhythm, normal heart sounds and intact distal pulses.  Exam reveals no gallop and no friction rub.   No murmur heard. Pulmonary/Chest: Effort normal and breath sounds normal. No respiratory distress. She has no wheezes. She has no rales. She exhibits no tenderness.  Scattered end expiratory forced wheeze in the upper lobes and in the lower lobes crackles  Abdominal: Soft. Bowel sounds are normal. She exhibits no distension and no mass. There is no tenderness. There is no rebound and no guarding.  Musculoskeletal: Normal range of motion. She exhibits no edema or tenderness.  Lymphadenopathy:    She has no cervical adenopathy.  Neurological: She is alert and oriented to person, place, and time. She has normal reflexes. No cranial nerve deficit. She exhibits normal muscle tone. Coordination normal.  Skin: Skin is warm and dry. No rash noted. She is not diaphoretic. No erythema. No pallor.  Psychiatric: She has a normal mood and  affect. Her behavior is normal. Judgment and thought content normal.  Vitals reviewed.   Vitals:   02/16/17 1115  BP: 112/62  Pulse: 67  SpO2: 98%  Weight: 213 lb (96.6 kg)  Height: 5\' 7"  (1.702 m)    Estimated body mass index is 33.36 kg/m as calculated from the following:   Height as of this encounter: 5\' 7"  (1.702 m).   Weight as of this encounter: 213 lb (96.6 kg).     Assessment:       ICD-10-CM   1. Acute bronchitis, unspecified organism J20.9   2. ILD (interstitial lung disease) (HCC) J84.9   3. Irritable larynx J38.7        Plan:      Acute bronchitis  - redo pred taper as follow - Take prednisone 40 mg daily x 2 days, then 20mg  daily x 2 days, then 10mg  daily x 2 days, then 5mg  daily x 2 days and stop  - continue 10d amox given by ER   ILD (interstitial lung disease) (HCC) - stable on ct  snce 2014 -> Nov 2017. Do not think this is cause of symptoms - though cause not known biopsy can be risky esp if is stavle since 2014  followup  -HRCT in 6 months  Irritable larynx  - as before    Followup - Return to see Dr. Marchelle Gearing in  6 months following high-resolution CT chest    Dr. Kalman Shan, M.Miles., [facility].C.P Pulmonary and Critical Care Medicine Staff Physician [facility] [place] [facility] Pager: [phone], If no answer or between  15:00h - 7:00h: call [phone]  02/16/2017 12:00 PM

## 2017-02-16 NOTE — Patient Instructions (Addendum)
ICD-10-CM   1. Acute bronchitis, unspecified organism J20.9   2. ILD (interstitial lung disease) (HCC) J84.9   3. Irritable larynx J38.7    Acute bronchitis  - redo pred taper as follow - Take prednisone 40 mg daily x 2 days, then 20mg  daily x 2 days, then 10mg  daily x 2 days, then 5mg  daily x 2 days and stop  - continue 10d amox given by ER   ILD (interstitial lung disease) (HCC) - stable on ct snce 2014 -> Nov 2017. Do not think this is cause of symptoms - though cause not known biopsy can be risky esp if is stavle since 2014  followup  -HRCT in 6 months  Irritable larynx  - as before    Followup - Return to see Dr. Marchelle Gearingamaswamy in  6 months following high-resolution CT chest

## 2017-03-08 DIAGNOSIS — J45909 Unspecified asthma, uncomplicated: Secondary | ICD-10-CM | POA: Diagnosis not present

## 2017-03-08 DIAGNOSIS — I10 Essential (primary) hypertension: Secondary | ICD-10-CM | POA: Diagnosis not present

## 2017-03-08 DIAGNOSIS — J4542 Moderate persistent asthma with status asthmaticus: Secondary | ICD-10-CM | POA: Diagnosis not present

## 2017-03-08 DIAGNOSIS — Z7984 Long term (current) use of oral hypoglycemic drugs: Secondary | ICD-10-CM | POA: Diagnosis not present

## 2017-03-08 DIAGNOSIS — E1140 Type 2 diabetes mellitus with diabetic neuropathy, unspecified: Secondary | ICD-10-CM | POA: Diagnosis not present

## 2017-03-08 DIAGNOSIS — E782 Mixed hyperlipidemia: Secondary | ICD-10-CM | POA: Diagnosis not present

## 2017-03-08 DIAGNOSIS — J441 Chronic obstructive pulmonary disease with (acute) exacerbation: Secondary | ICD-10-CM | POA: Diagnosis not present

## 2017-03-08 DIAGNOSIS — J449 Chronic obstructive pulmonary disease, unspecified: Secondary | ICD-10-CM | POA: Diagnosis not present

## 2017-03-17 DIAGNOSIS — G8929 Other chronic pain: Secondary | ICD-10-CM | POA: Diagnosis not present

## 2017-03-17 DIAGNOSIS — M25511 Pain in right shoulder: Secondary | ICD-10-CM | POA: Diagnosis not present

## 2017-03-19 DIAGNOSIS — E1140 Type 2 diabetes mellitus with diabetic neuropathy, unspecified: Secondary | ICD-10-CM | POA: Diagnosis not present

## 2017-03-19 DIAGNOSIS — J4542 Moderate persistent asthma with status asthmaticus: Secondary | ICD-10-CM | POA: Diagnosis not present

## 2017-03-19 DIAGNOSIS — J45909 Unspecified asthma, uncomplicated: Secondary | ICD-10-CM | POA: Diagnosis not present

## 2017-03-19 DIAGNOSIS — I10 Essential (primary) hypertension: Secondary | ICD-10-CM | POA: Diagnosis not present

## 2017-03-19 DIAGNOSIS — J449 Chronic obstructive pulmonary disease, unspecified: Secondary | ICD-10-CM | POA: Diagnosis not present

## 2017-03-19 DIAGNOSIS — E782 Mixed hyperlipidemia: Secondary | ICD-10-CM | POA: Diagnosis not present

## 2017-03-19 DIAGNOSIS — J441 Chronic obstructive pulmonary disease with (acute) exacerbation: Secondary | ICD-10-CM | POA: Diagnosis not present

## 2017-03-22 DIAGNOSIS — R69 Illness, unspecified: Secondary | ICD-10-CM | POA: Diagnosis not present

## 2017-03-24 DIAGNOSIS — M25511 Pain in right shoulder: Secondary | ICD-10-CM | POA: Diagnosis not present

## 2017-03-24 DIAGNOSIS — G8929 Other chronic pain: Secondary | ICD-10-CM | POA: Diagnosis not present

## 2017-03-27 DIAGNOSIS — J209 Acute bronchitis, unspecified: Secondary | ICD-10-CM | POA: Diagnosis not present

## 2017-04-07 DIAGNOSIS — M75121 Complete rotator cuff tear or rupture of right shoulder, not specified as traumatic: Secondary | ICD-10-CM | POA: Diagnosis not present

## 2017-04-07 DIAGNOSIS — Z23 Encounter for immunization: Secondary | ICD-10-CM | POA: Diagnosis not present

## 2017-04-27 DIAGNOSIS — E1140 Type 2 diabetes mellitus with diabetic neuropathy, unspecified: Secondary | ICD-10-CM | POA: Diagnosis not present

## 2017-04-27 DIAGNOSIS — K3184 Gastroparesis: Secondary | ICD-10-CM | POA: Diagnosis not present

## 2017-04-27 DIAGNOSIS — K219 Gastro-esophageal reflux disease without esophagitis: Secondary | ICD-10-CM | POA: Diagnosis not present

## 2017-04-27 DIAGNOSIS — I10 Essential (primary) hypertension: Secondary | ICD-10-CM | POA: Diagnosis not present

## 2017-04-27 DIAGNOSIS — E782 Mixed hyperlipidemia: Secondary | ICD-10-CM | POA: Diagnosis not present

## 2017-04-27 DIAGNOSIS — J4542 Moderate persistent asthma with status asthmaticus: Secondary | ICD-10-CM | POA: Diagnosis not present

## 2017-04-27 DIAGNOSIS — K5900 Constipation, unspecified: Secondary | ICD-10-CM | POA: Diagnosis not present

## 2017-04-27 DIAGNOSIS — G4730 Sleep apnea, unspecified: Secondary | ICD-10-CM | POA: Diagnosis not present

## 2017-04-27 DIAGNOSIS — J383 Other diseases of vocal cords: Secondary | ICD-10-CM | POA: Diagnosis not present

## 2017-05-04 DIAGNOSIS — M75121 Complete rotator cuff tear or rupture of right shoulder, not specified as traumatic: Secondary | ICD-10-CM | POA: Diagnosis not present

## 2017-05-31 DIAGNOSIS — J203 Acute bronchitis due to coxsackievirus: Secondary | ICD-10-CM | POA: Diagnosis not present

## 2017-05-31 DIAGNOSIS — R05 Cough: Secondary | ICD-10-CM | POA: Diagnosis not present

## 2017-05-31 DIAGNOSIS — J4541 Moderate persistent asthma with (acute) exacerbation: Secondary | ICD-10-CM | POA: Diagnosis not present

## 2017-06-07 DIAGNOSIS — J4541 Moderate persistent asthma with (acute) exacerbation: Secondary | ICD-10-CM | POA: Diagnosis not present

## 2017-06-07 DIAGNOSIS — G4700 Insomnia, unspecified: Secondary | ICD-10-CM | POA: Diagnosis not present

## 2017-06-11 DIAGNOSIS — M24111 Other articular cartilage disorders, right shoulder: Secondary | ICD-10-CM | POA: Diagnosis not present

## 2017-06-11 DIAGNOSIS — M7541 Impingement syndrome of right shoulder: Secondary | ICD-10-CM | POA: Diagnosis not present

## 2017-06-11 DIAGNOSIS — M75121 Complete rotator cuff tear or rupture of right shoulder, not specified as traumatic: Secondary | ICD-10-CM | POA: Diagnosis not present

## 2017-06-11 DIAGNOSIS — S43431A Superior glenoid labrum lesion of right shoulder, initial encounter: Secondary | ICD-10-CM | POA: Diagnosis not present

## 2017-06-11 DIAGNOSIS — M19011 Primary osteoarthritis, right shoulder: Secondary | ICD-10-CM | POA: Diagnosis not present

## 2017-06-11 DIAGNOSIS — G8918 Other acute postprocedural pain: Secondary | ICD-10-CM | POA: Diagnosis not present

## 2017-06-13 DIAGNOSIS — M75121 Complete rotator cuff tear or rupture of right shoulder, not specified as traumatic: Secondary | ICD-10-CM | POA: Diagnosis not present

## 2017-06-13 DIAGNOSIS — M19011 Primary osteoarthritis, right shoulder: Secondary | ICD-10-CM | POA: Diagnosis not present

## 2017-06-13 DIAGNOSIS — R600 Localized edema: Secondary | ICD-10-CM | POA: Diagnosis not present

## 2017-06-13 DIAGNOSIS — M25511 Pain in right shoulder: Secondary | ICD-10-CM | POA: Diagnosis not present

## 2017-06-13 DIAGNOSIS — M7541 Impingement syndrome of right shoulder: Secondary | ICD-10-CM | POA: Diagnosis not present

## 2017-06-13 DIAGNOSIS — M24111 Other articular cartilage disorders, right shoulder: Secondary | ICD-10-CM | POA: Diagnosis not present

## 2017-06-13 DIAGNOSIS — M66811 Spontaneous rupture of other tendons, right shoulder: Secondary | ICD-10-CM | POA: Diagnosis not present

## 2017-07-08 ENCOUNTER — Encounter (HOSPITAL_COMMUNITY): Payer: Self-pay | Admitting: *Deleted

## 2017-07-28 DIAGNOSIS — J209 Acute bronchitis, unspecified: Secondary | ICD-10-CM | POA: Diagnosis not present

## 2017-07-29 DIAGNOSIS — M25511 Pain in right shoulder: Secondary | ICD-10-CM | POA: Diagnosis not present

## 2017-08-16 ENCOUNTER — Ambulatory Visit: Payer: Medicare HMO | Admitting: Internal Medicine

## 2017-08-19 ENCOUNTER — Ambulatory Visit (INDEPENDENT_AMBULATORY_CARE_PROVIDER_SITE_OTHER)
Admission: RE | Admit: 2017-08-19 | Discharge: 2017-08-19 | Disposition: A | Discharge status: Home / Self Care | Payer: Medicare HMO | Source: Ambulatory Visit | Attending: Internal Medicine | Admitting: Internal Medicine

## 2017-08-19 DIAGNOSIS — J849 Interstitial pulmonary disease, unspecified: Secondary | ICD-10-CM

## 2017-08-19 DIAGNOSIS — R0602 Shortness of breath: Secondary | ICD-10-CM | POA: Diagnosis not present

## 2017-08-20 ENCOUNTER — Ambulatory Visit: Payer: Medicare HMO | Admitting: Internal Medicine

## 2017-08-20 ENCOUNTER — Encounter: Payer: Self-pay | Admitting: Internal Medicine

## 2017-08-20 VITALS — BP 108/60 | HR 73 | Ht 67.0 in | Wt 213.0 lb

## 2017-08-20 DIAGNOSIS — R053 Chronic cough: Secondary | ICD-10-CM

## 2017-08-20 DIAGNOSIS — J849 Interstitial pulmonary disease, unspecified: Secondary | ICD-10-CM

## 2017-08-20 DIAGNOSIS — R0600 Dyspnea, unspecified: Secondary | ICD-10-CM

## 2017-08-20 DIAGNOSIS — J387 Other diseases of larynx: Secondary | ICD-10-CM | POA: Diagnosis not present

## 2017-08-20 DIAGNOSIS — R05 Cough: Secondary | ICD-10-CM

## 2017-08-20 DIAGNOSIS — R0609 Other forms of dyspnea: Secondary | ICD-10-CM | POA: Diagnosis not present

## 2017-08-20 NOTE — Progress Notes (Signed)
Subjective:     Patient ID: [name], female   DOB: [birth year], 62 y.o.   MRN: [id number]  HPI    OV 02/17/2011. 62 year old female. Obese. AA female. Remote smoker On cpap for OSA (pmd managing). REferred by Dr Delfina Redwood.    New visit for chronic cough. Preesent for many years. Insidious onset.  Progressively with more frequency past few years. Cough rated as moderate. Usually dry cough with associated barking quality. Occ mucus present; thick and clear. Cough worsened by milk products, change in weather and dust. Hx of prior ENT eval in [place] > 15 years ago: diagnosed as "vocal cords sticking together" and remote hx of sinus surgery Also diagnosed with GERD in [place] > 15 years ago; takes protonix.  [facility] Reflux Symptom INdex score is 39 and very c/w LPR cough(severe hoarsness of voice, clearing of throat, ecess throat mucus, post nasal drip, coughing after lying down or eating, choking epidoses, sensation of globus with food sticking in thorat) and associated heart burn. Cough so severe that she could not complete spirometry in office today  Has dyspnea too: few years, insidious onset. Progressive. Rates it as moderate. Dyspnea brought on by associated chest tightness and nasal drainage or even exertion like climbing flight of steps. Dyspnea relieved by rest. Cough and dyspnea associated with wheezing and constant yellow sinus drainage as well. Frequent nocturnal awakenings with choking present. Above resulted in asthma diagnosis versus tracheobronchiolotis > 15 years ago: on advair since then which she is unsure is helping. She herself is not convinced she has asthma.   Associated med intake shows tramadol (for shoulder pain and sicatica), fish oil/ flaxseed (2 years) and ACE inhibitor intake (been on lisniopril for > 10 years due to diabetes)   Pas med hx review fromn outside record notes "hx of asthma and vocal cord dysfunction and post nasal drip. Recalls frequent ER visits atleast one  per month (last er visit 2 months ago per hx) to [place]. Denies ICU admits, intubations for same.   REC Cough is from ACE Inhibitor, sinus drainage, vocal cord dysfunction, acid reflux and possible cough all conspiring to cause cyclical cough/LPR cough #Sinus drainage  - continue nasal steroid - refer to [facility] ENT Dr. Wilburn Cornelia or Dr Redmond Baseman #Possible Acid Reflux  - continue protonix   - take diet sheet from [place] - avoid colas, spices, cheeses, spirits, red meats, beer, chocolates, fried foods etc.,   - sleep with head end of bed elevated  - eat small frequent meals  - do not go to bed for 3 hours after last meal - stop fish oil and flaxseed for now #Asthma   - continue advair for now - might have to consider changing over to something else later depending on ENT evaluation #Vocal Cord Dysfunction - see ENT doctor first  -later  will consider referral to speech therapy with [name] #BP  - stop lisinopril - take benicar 1 tablet daily - take sample, nurse will add it in med list #Followup - I will see you in 4-6 weeks.  - Reattempt spirometry with [name] at time of followup -Important you comply with advice 100% correctly   OV 03/31/11: Followup cough. SAw ENT 89/17./12: no anatomic path or VCD noticed at time of exam though patient noted to have choked.  She states that she was not happy with her experience at ENT visit and does not want to visit the same office again. She is compliant  with instructions except for diet though she has made improvements there. Off lisniopril. Follows sinus, gerd and astham advice. Does not like advair diskus. Overall cough is much better. She is happy with improvement. RSI cough score dropped from 39 to 14.  She sses her hoarsenss, clearing and pos nasal drip as mild-moderate  PFTS show no more flow volume loop issues. There is restriction with mixed obstruction. DLCO 67%   REC Cough is from sinus drainage, vocal cord dysfunction,  acid reflux and possible cough all conspiring to cause cyclical cough/LPR cough  #Sinus drainage  - continue nasal steroid - nurse will do script for generic fluticasone -  #Acid Reflux  - continue protonix   - take diet sheet from [place] [facility] - avoid colas, spices, cheeses, spirits, red meats, beer, chocolates, fried foods etc.,   - sleep with head end of bed elevated  - eat small frequent meals  - do not go to bed for 3 hours after last meal - cotninue to avoid fish oil and flaxseed   #Asthma   - continue advair but nurse will give you sample of HFA and teach you how to take it with spacer; she will do script as well  #Vocal Cord Dysfunction - referral to speech therapy with [name]  #BP  - will add lisinopril to allergy list - take benicar 1 tablet daily or the equivalent Dr Delfina Redwood gives you  #Followup - I will see you in 8 weeks.  - -Important you comply with advice 100% correctly - Glad you are much better - Flu shot today please   OV 06/26/2011 Followup for chronic cough. She continues to do better. Subjectively feels much better compared to last visit. Although objectively her cough score has only reduced by 1 point. Today, RSI cough score is 13. Level III postnasal drip. Level to hoarseness of voice, cough after lying down, choking episodes, and annoying cough. Level I sensation of lump in throat and heartburn.  In talking to her she is compliant with her nasal steroid and her Protonix for acid reflux but not compliant with diet for acid reflux. She is on Advair HFA with spacer for presumed asthma and she feels this makes her cough less compared to Advair discus. We discussed more about her asthma history and she says that she was never sure she had asthma diagnosis in [place] it was a presumptive diagnosis. She is eager to come off Advair and give a trial without it. She also gives a history of allergies and allergy shots while living in [place] and she is open to an  allergy evaluation right now. In terms of cyclical cough she has finished speech therapy evaluation and graduated from the program. Overall she feels quality-of-life is acceptable although she would like to see cough improve further. So far we have not tried Neurontin    Past, Family, Social reviewed: no change since last visit except that Dr Deforest Hoyles is new PMD. She now states she has year round allergies. Recollects while in [place] used to take allergy shots twice a week 10 years ago. Since moving to [place] 4 years ago not seen an allergist. Laverle Hobby to see allergist. She feels she does not have asthma.  Cough is from sinus drainage, vocal cord dysfunction, acid reflux and possible asthma or allergiesh all working together to cause cyclical cough/LPR cough  #Sinus drainage  - continue nasal steroid - nurse will do script for generic fluticasone  -  #Acid Reflux  -  continue protonix  - contniue (REALLY IMPORANT IS DIET) diet sheet from [place] [facility] - avoid colas, spices, cheeses, spirits, red meats, beer, chocolates, fried foods etc.,  - sleep with head end of bed elevated  - eat small frequent meals  - do not go to bed for 3 hours after last meal  - cotninue to avoid fish oil and flaxseed  #Asthma  - since diagnosis of asthma was in doubt. Please stop Advair and just use albuterol as needed  -We'll do spirometry at followup  # allergies  - Please see Dr. Baird Lyons in our office for allergy evaluation; will do referral  #Vocal Cord Dysfunction  - congratulations on graduating from program with speech therapy [name]  #BP  - Continue losartan  #Followup  - I will see you in 8 weeks.  - -Important you comply with advice 100% correctly  - Glad you are much better  - Cough score worksheet at followup   OV 08/25/2011 Followup for chronic cough. She is off advair as advised. She is doing gerd control.  Ojectively her cough score is not reduced. Last visit score was 13 but now it is 14.  (Level III postnasal drip, and post nasal drip. . Level 2  hoarseness of voice and choking episodes. Level 1 - cough after lying down,and  sensation of lump in throat and heartburn). Howevefr, subjectively cough much improved. Still with post nasal drip. Has seen Dr Annamaria Boots for allergy 08/06/11 and 08/19/11. RAST serum profile negative except for elevated [place] IGE. Allergy skin test pending 09/16/11. On visit 08/19/11 to Dr Annamaria Boots had atypical chest pain - releived with ppi in office. EKG okay. Trop was normal. . Of note, Dr Annamaria Boots is also addressing her prior OSA issues.   ROS c/o anxiety, insomnia, also chest pains  - atypical  Spirometry  - fev1 1.74L/72%. Ratio 78    #Chest pains  - please see [facility] cardiology asap - > equivocal stress test and but clinically low probability February 2013; placed in clinical followup #Cough  - for sinus: follow Dr Annamaria Boots recommendation  - for acid reflux: follow medications and diet  - for possible asthma: stay off advair and once cardiology ok, please have methacholine challenge test  #Followup  - complete cardiology visit and methacholine challenge  - 4-6 weeks from now  09/16/11 with Dr Annamaria Boots Allergist- 30 yoF former smoker referred by Dr Chase Caller who has been seeing her for multifactorial cough, questioning an allergic component At last visit she had presented with chest pain, preventing planned allergy skin testing. Now following with cardiology and pending CT scan of her heart/Dr. Burt Knack. Still complains of nasal congestion. Able to use her CPAP more regularly when her nose is not stopped up. Unable to do methacholine challenge last month because her baseline is from a tree scores were too low. CPAP autotitration done/[facility]. Pending download. PFT- 09/11/11- FVC 2.15/68%, FEV1 1.59/ 64%, FEV1/FVC 0.80. FEF 25-75 % 0.37/ 15%   OV 03/21/2013 Followup chronic cough  - I have not seen her in nearly 15 months. She says that basically her cough resolved but  cough is no longer chronic and only occurs intermittently when she has "bronchitis". In the last year she's had antibiotics and prednisone for acute episodes of coughing at least 3 times. In between episodes she is asymptomatic. Currently she feels she's having one such episode in the past 3 weeks with chest congestion, cough with white mucus, fatigue and associated wheeze. Currently  RSI cough score is 21 and show severe cough.  - Of note, she has not had methacholine challenge test that I advised a year and half ago. She says that she does not have vocal cord dysfunction based on Dr. Mickie Hillier exam in August 2012 ENT physician   REC levaquin and pred burst  04/20/2013 Follow up and Med review  new med calendar - pt brought all meds with her today.  does report some sinus pressure, congestion, SOB, wheezing, chest tightness. Is feeling better but still gets into coughing fits that takes her a while to calm down. Strong odors seem to causes attacks along with laughing  She did have an attack in the office. Sats were normal however she has significant upper airway psuedowheezing w/ barking cough.  No fever , chest pain, over reflux , orthopnea or edema.  No using CPAP lately   REC top Advair . Add Chlor trimeton 92m 2 At bedtime   Change Loratadine 161min am.  Add Pepcid 2099mt bedtime   Avoid throat clearing , use sips of water.  Saline nasal rinses Twice daily  .  NO MINT PRODUCTS  GERD diet .  We are setting you up for a CT neck .  Restart CPAP At bedtime   follow up Dr. RamChase Caller 4 weeks.  Please contact office for sooner follow up if symptoms do not improve or worsen or seek emergency care    OV  05/26/2013 Chief Complaint  Patient presents with  . Follow-up    Pt c/o chest tightness, dry cough, wheezing and sob. Pt states this was some better while she was on prednisone. Last dose x 3 weeks ago.   Followup for chronic cough. She says her last 2-3 weeks that she's having  an acute bronchitis flare with yellow phlegm. She feels she needs antibiotics and prednisone. In fact in the time that she last saw me in September 2014 she's had another round of antibiotics and prednisone with her primary care physician Dr. HusDeforest Hoyleshe feels her chest is congested although in the office she is clearly having a barking cough, laryngeal spasm and upper airway noise intermittently with changes in voice. It is fairly significant. I reminded her that this is consistent with vocal cord dysfunction but she said that her ear nose throat surgeon cleared her for the same. Of note, she had a CT scan of the neck and this was normal.  07/26/2013 Follow up  2 month follow up - reports some  congestion, head congestion w/ light yellow mucus that is on/off. Has sinus drainage daily . Is not using claritin or chlortrimeton as recommended.  Was taken off ACE in past , now on Cozaar.   RSI cough score unchanged at 21  Had Esophagram on 1/8 w/ mild GERD/HH. No sign delay emptying .  No chest pain, orthopnea, edema , fever.  +hoarseness on/off.   She says she feels so much better with CPAP , using during daytime for naps and At bedtime  . Is amazed at how much she feels better.    OV 03/20/2014  Chief Complaint  Patient presents with  . Follow-up    Pt states she is unable to hold her voice as long when she sings. Pt c/o PND and prod cough, pt thinks its d/t allergies. Pt c/o chest soreness on right upper chest.    Followup chronic cough  - I have not seen the patient in nearly 10 months. Overall cough  was stable but recently she has had a flareup. I'm not so sure that she is followed instructions compliantly. It took me a while to understand the medication regimen but it appears that she takes dymista for  Sinuses but saline wash as needed, and not taking claritin, Advair as needed for her lungs, Protonix schedule followup acid reflux but Pepcid only as needed. She is only somewhat compliant with  the Neurontin. She continues to have sinus drainage, clearing of the throat and the laryngeal/barking cough that she feels is coming from the throat. This is associated with dyspnea and is of moderate severity, and some wheezing but she feels wheeze is upper airway  REC #Chronic cough  - I thnk cough is related to voice box issues and loss of neural control of the same - Please take prednisone 40 mg x1 day, then 30 mg x1 day, then 20 mg x1 day, then 10 mg x1 day, and then 5 mg x1 day and stop - refer Dr Ernestine Conrad of [facility] for chronic cough - for now continue current nasal, acid reflux and neurontin therapies - continue albuterol as needed ; for now hold off scheduled advair - repeat HRCT chest wo contrast for chronic cough  #FOllowup  3 months - after you see Dr Joya Gaskins of [facility]  Come sooner if there are problems o  OV 10/22/2014  Chief Complaint  Patient presents with  . Follow-up    Pt stated her cough has improved. Pt c/o hoarseness and chest tightness. Pt is wearing CPAP nightly and denies any issues with machine, mask or pressure.     FU chronic cough  - IN interim in Sept 2015 had CT chest : CT chest 03/26/14 shows worsening infiltreats in bases compaed to nov 2014. COncern for aspiration and referred  To GI. She does not know details of GI Visit but review of records shows she has gastroparesis and hs been started on reglan. Also she saw Dr Joya Gaskins of ENT in [place]. WIth above measure cough has resolved. /nearly resolved. No respiratory issues.   reports that she quit smoking about 18 years ago. Her smoking use included Cigarettes. She has a 25 pack-year smoking history. She has never used smokeless tobacco.   OV 11/12/2014  Chief Complaint  Patient presents with  . Follow-up    Pt here after PFT. Pt stated her breathing has slightly improved,she is less SOB. Pt stated her cough has also imrpoved. Pt has not yet had autoimmune panel.    Follow-up  chronic cough with interstitial lung disease findings  As of last visit her cough resolved. Cough was related to aspiration. As soon as she started following gastroenterology advice cough resolved. However follow-up CT scan of the chest in April 2016 compared to fall 2015 showed persistent ILD findings as described below [personally reviewed image]. At this point she is asymptomatic. I had her do pulmonary function test and autoimmune workup and reports for follow-up. Of these she only completed pulmonary function test that shows restriction with slight reduction in diffusion capacity [some of the restriction can be due to obesity]. She continues to be asymptomatic. She's not having reflux symptoms anymore. Of note, she forgot to have a autoimmune test. She does admit that her daughter has lupus   IMPRESSION: CT chest 10/29/2014 personally reviewed image  1. Stable findings of peribronchovascular ground-glass attenuation, predominantly in the basal portions of the lungs bilaterally, again favored to reflect nonspecific interstitial pneumonia (NSIP). 2. Mild  air trapping, indicative of mild small airways disease. Electronically Signed  By: Vinnie Langton M.D.  On: 10/29/2014 16:19  Pulmonary function test 10/31/2014 shows restriction with reduced diffusion capacity. FVC 2.1 L/69%, FEV1 1.8 L/75% and ratio of 86. No broncho-dilator response. Total lung capacity 3.5 L/63% and DLCO 20.0/70% Sleep Apnea FOLLOWS FOR: pt wearing cpap nightly, c/o mask getting hot sometimes qhs.  has trouble staying asleep.     OV 06/10/2015  Chief Complaint  Patient presents with  . Follow-up    Pt states she has been getting more frequent sinus infections, slight increase in SOB, chest heaviness. Pt here after HRCT. Pt wearing CPAP nightly.    Follow-up multifactorial chronic cough Follow-up mild basal interstitial lung disease not otherwise specified  She currently reports that she is not having much  cough although after I left the office I heard a cough and periodically she did have variable upper airway noise. She continues to have gastroparesis related acid reflux particularly when she eats food she regurgitates. She last saw GI March 2016 and was asked to increase and metoclopramide. She's not having much dyspnea. Overall she feels okay. She is having some atypical chest pain going on for a few months in the right supramammary area   07/16/2015-62 year old female former smoker followed by Dr. Chase Caller for mild ILD, recurrent sinus infections, chronic cough, Sleep Apnea FOLLOWS FOR: pt wearing cpap nightly, c/o mask getting hot sometimes qhs.  has trouble staying asleep.   Here for CPAP follow-up. "Loves it" and says she is wearing it all night every night. Fullface mask, [facility]/12.  Not noticing active respiratory problems currently with little cough    OV  09/18/2015  Chief Complaint  Patient presents with  . Follow-up    pt recently seen at [facility] ED for URI- pt c/o prod cough with thick clear mucus, SOB with exertion, wheezing.     Follow-up chronic cough which is multifactorial with a strong component of irritable larynx syndrome, vocal cord dysfunction and gastroparesis related to diabetes  Follow-up interstitial lung disease probably as a result of above but etiology uncertain   I last saw her in November 2016. She was supposed to follow-up with me in November 2017. In December 2016 I did make her see GI. They did a swallow study. I reviewed those results and she did not aspirate but coughed significantly during swallowing. No specific interventions have been recommended. It appears she has increase and metoclopramide. Then 09/01/2015 ended up in the ER with shortness of breath and wheezing episode and was given antibiotics and prednisone according to her history. Now she significantly improved and close to baseline. She does have some wheezing but she says it's all from the upper  airway. She does not want another course of anabiotic's of prednisone. She is on Neurontin but this is not helping. Her creatinine was 1 mg percent troponin was normal and hemoglobin was normal 09/01/2015. She had chest x-ray 09/01/2015 and is clear per report. I did not visualize this film.  In terms of her vocal cord dysfunction review of my prior notes shows that she is seen ENT multiple times. Most recently she saw [facility] Dr. Joya Gaskins and she had resolution but now the cough due to irritable larynx has come back. She says she cannot go back to [facility] because of insurance reasons. She is willing to reestablish with the ENT locally.   OV 06/15/2016  Chief Complaint  Patient presents with  . Follow-up  Pt states she is waking every 2 hours because she is SOB, pt is still wearing CPAP. Pt c/o prod cough with clear mucus. Pt states she just completed a pred taper and abx for URI.      Follow-up multifactorial chronic cough Follow-up mild basal interstitial lung disease not otherwise specified   Obese female with significant vocal cord dysfunction/irritable larynx syndrome returns for follow-up. Last seen March 2017. She had follow-up high-resolution CT chest 06/09/2016 that I personally visualized. The findings show extremely subtle presence of interstitial lung disease all stable since 2014. In terms of symptoms of cough she is overall stable. However she tells me she has significant episodes of dyspnea at night when she wears a CPAP. Here in the office when asked her to open her mouth she made it given the significant upper airway coughing spell. She also complains of dyspnea on exertion.   OV 08/14/2016  Chief Complaint  Patient presents with  . Follow-up    Was told to follow up after stress test. Stress test was done back in Dec 2017. Breathing has ok for the past few weeks, had a minor URI.      62 y.o. [name] presents for fu of CPST results in 07/01/16.  Reviewed terst results with her. Somewhat submaximal of RER 0.96. Audible wheezing prior to start of test. No EIB. Has vo2 max that went from low normal to normal when correced for IBW. Not clear if there was diast dysfn. Got dyspneic  OV 02/16/2017  Chief Complaint  Patient presents with  . Follow-up    Pt states she thinks her breathing has recently worsened d/t bronchitis. Pt states she is taking pred. Pt c/o non prod cough, chest discomfort from coughing, chest congestion. Pt deines f/c/s.     Follow-up multifactorial chronic cough with vcd Follow-up mild basal interstitial lung disease not otherwise specified   This is a routine follow-up for the above issues but 2 days ago she ended up in the ER because of sinus drainage bronchospasm. According to her history she was discharged with 10 days of amoxicillin and 5 days of prednisone. She is much improved although she still complaining of chest tightness in throat tightness and cough. She also some increased wheezing than baseline but all improving. There are no other new issues. Labs and chest x-ray from the ER visit review reviewed and documented below.   OV 08/20/17  Chief Complaint  Patient presents with  . Follow-up    HRCT done 08/19/17.  Pt states the SOB is a little worse since last visit and pt does have some coughing with occ. thick mucus and congestion due to sinus problems   Follow-up multifactorial chronic cough with severe irritable larynx/VCD  follow-up mild basal interstitial lung disease that has been stable for many years.   Overall stable.  In the interim she has had shoulder surgery.  She complains of class 3/2 dyspnea on exertion that is stable.  Her weight is unchanged.  No new issues. Walking desaturation test on 08/20/2017 185 feet x 3 laps on ROOM AIR:  did NOT desaturate. Rest pulse ox was 100%, final pulse ox was 100%. HR response 68/min at rest to 91/min at peak exertion. Patient [name]  Did not Desaturate  < 88% . [name] did not  Desaturated </= 3% points. [name] yes did get tachyardic CT scan of the chest showed stability.   IMPRESSION: HRCT 1. There are very subtle  changes in the lung bases which are again suggestive of interstitial lung disease. The CT pattern is indeterminate for usual interstitial pneumonia (UIP), and given the stability compared to 2017, and the presence of air trapping, is rather favored to reflect mild nonspecific interstitial pneumonia (NSIP). 2. Tracheobronchomalacia. 3. Aortic atherosclerosis, in addition to left circumflex coronary artery disease. Please note that although the presence of coronary artery calcium documents the presence of coronary artery disease, the severity of this disease and any potential stenosis cannot be assessed on this non-gated CT examination. Assessment for potential risk factor modification, dietary therapy or pharmacologic therapy may be warranted, if clinically indicated.  Aortic Atherosclerosis (ICD10-I70.0).   Electronically Signed   By: Vinnie Langton M.D.   On: 08/19/2017 12:56  Review of Systems     Objective:   Physical Exam  Constitutional: She is oriented to person, place, and time. She appears well-developed and well-nourished. No distress.  obese  HENT:  Head: Normocephalic and atraumatic.  Right Ear: External ear normal.  Left Ear: External ear normal.  Mouth/Throat: Oropharynx is clear and moist. No oropharyngeal exudate.  Eyes: Conjunctivae and EOM are normal. Pupils are equal, round, and reactive to light. Right eye exhibits no discharge. Left eye exhibits no discharge. No scleral icterus.  Neck: Normal range of motion. Neck supple. No JVD present. No tracheal deviation present. No thyromegaly present.  Cardiovascular: Normal rate, regular rhythm, normal heart sounds and intact distal pulses. Exam reveals no gallop and no friction rub.  No murmur heard. Pulmonary/Chest: Effort normal.  No respiratory distress. She has no wheezes. She has rales. She exhibits no tenderness.  Abdominal: Soft. Bowel sounds are normal. She exhibits no distension and no mass. There is no tenderness. There is no rebound and no guarding.  Musculoskeletal: Normal range of motion. She exhibits no edema or tenderness.  Lymphadenopathy:    She has no cervical adenopathy.  Neurological: She is alert and oriented to person, place, and time. She has normal reflexes. No cranial nerve deficit. She exhibits normal muscle tone. Coordination normal.  Skin: Skin is warm and dry. No rash noted. She is not diaphoretic. No erythema. No pallor.  Psychiatric: She has a normal mood and affect. Her behavior is normal. Judgment and thought content normal.  Vitals reviewed.  Vitals:   08/20/17 0955  BP: 108/60  Pulse: 73  SpO2: 98%  Weight: 213 lb (96.6 kg)  Height: 5' 7"$  (1.702 m)    Estimated body mass index is 33.36 kg/m as calculated from the following:   Height as of this encounter: 5' 7"$  (1.702 m).   Weight as of this encounter: 213 lb (96.6 kg).     Assessment:       ICD-10-CM   1. ILD (interstitial lung disease) ([place]) J84.9   2. Dyspnea on exertion R06.09   3. Chronic cough R05   4. Irritable larynx syndrome J38.7        Plan:     ILD (interstitial lung disease) (HCC) Dyspnea on exertion  - stable CT and walk test  - shortness of brreath is from ILD and from weight and physical deconditioning  -refer pulmonary rehab  Chronic cough Irritable larynx syndrome  - stable and mild - expectant followup  Followup 1 year or sooner if needed   Dr. Brand Males, M.D., [facility].C.P Pulmonary and Critical Care Medicine Staff Physician, [place] Director - Interstitial Lung Disease  Program  Pulmonary [place] [facility] [place]  Pulmonary [place], [place], [address]  Pager: [phone], If no answer or between  15:00h - 7:00h: call [phone] Telephone: [phone]

## 2017-08-20 NOTE — Patient Instructions (Signed)
ILD (interstitial lung disease) (HCC) Dyspnea on exertion  - stable CT and walk test  - shortness of brreath is from ILD and from weight and physical deconditioning  -refer pulmonary rehab  Chronic cough Irritable larynx syndrome  - stable and mild - expectant followup  Followup 1 year or sooner if needed

## 2017-08-31 ENCOUNTER — Telehealth (HOSPITAL_COMMUNITY): Payer: Self-pay

## 2017-08-31 NOTE — Telephone Encounter (Signed)
Patients insurance is active and benefits verified through [employer] - $30.00 co-pay, no deductible, out of pocket amount of $4,200/$132.57 has been met, no co-insurance, and no pre-authorization is required. Spoke with [employer] - reference [id number]

## 2017-09-01 ENCOUNTER — Telehealth (HOSPITAL_COMMUNITY): Payer: Self-pay

## 2017-09-01 NOTE — Telephone Encounter (Signed)
Attempted to call patient in regards to Pulmonary Rehab - lm on vm °

## 2017-09-07 ENCOUNTER — Telehealth (HOSPITAL_COMMUNITY): Payer: Self-pay

## 2017-09-07 NOTE — Telephone Encounter (Signed)
Called to speak with patient in regards to Pulmonary rehab - patient is not interested in the program as she cannot afford the co-pay. Patient [facility]. Closed referral.

## 2017-09-22 DIAGNOSIS — J449 Chronic obstructive pulmonary disease, unspecified: Secondary | ICD-10-CM | POA: Diagnosis not present

## 2017-09-22 DIAGNOSIS — K0889 Other specified disorders of teeth and supporting structures: Secondary | ICD-10-CM | POA: Diagnosis not present

## 2017-09-28 DIAGNOSIS — G4733 Obstructive sleep apnea (adult) (pediatric): Secondary | ICD-10-CM | POA: Diagnosis not present

## 2017-10-18 ENCOUNTER — Encounter: Payer: Self-pay | Admitting: *Deleted

## 2017-10-27 DIAGNOSIS — E1140 Type 2 diabetes mellitus with diabetic neuropathy, unspecified: Secondary | ICD-10-CM | POA: Diagnosis not present

## 2017-10-27 DIAGNOSIS — E782 Mixed hyperlipidemia: Secondary | ICD-10-CM | POA: Diagnosis not present

## 2017-10-27 DIAGNOSIS — R69 Illness, unspecified: Secondary | ICD-10-CM | POA: Diagnosis not present

## 2017-10-27 DIAGNOSIS — Z0000 Encounter for general adult medical examination without abnormal findings: Secondary | ICD-10-CM | POA: Diagnosis not present

## 2017-10-27 DIAGNOSIS — K5900 Constipation, unspecified: Secondary | ICD-10-CM | POA: Diagnosis not present

## 2017-10-27 DIAGNOSIS — K3184 Gastroparesis: Secondary | ICD-10-CM | POA: Diagnosis not present

## 2017-10-27 DIAGNOSIS — J383 Other diseases of vocal cords: Secondary | ICD-10-CM | POA: Diagnosis not present

## 2017-10-27 DIAGNOSIS — J310 Chronic rhinitis: Secondary | ICD-10-CM | POA: Diagnosis not present

## 2017-10-27 DIAGNOSIS — Z1389 Encounter for screening for other disorder: Secondary | ICD-10-CM | POA: Diagnosis not present

## 2017-10-27 DIAGNOSIS — K219 Gastro-esophageal reflux disease without esophagitis: Secondary | ICD-10-CM | POA: Diagnosis not present

## 2017-10-27 DIAGNOSIS — I10 Essential (primary) hypertension: Secondary | ICD-10-CM | POA: Diagnosis not present

## 2017-10-28 ENCOUNTER — Ambulatory Visit: Payer: Medicare HMO | Admitting: Internal Medicine

## 2017-10-28 ENCOUNTER — Encounter: Payer: Self-pay | Admitting: Internal Medicine

## 2017-10-28 VITALS — BP 118/64 | HR 60 | Ht 65.5 in | Wt 209.5 lb

## 2017-10-28 DIAGNOSIS — K3184 Gastroparesis: Secondary | ICD-10-CM | POA: Diagnosis not present

## 2017-10-28 DIAGNOSIS — K219 Gastro-esophageal reflux disease without esophagitis: Secondary | ICD-10-CM

## 2017-10-28 DIAGNOSIS — K5909 Other constipation: Secondary | ICD-10-CM

## 2017-10-28 MED ORDER — METOCLOPRAMIDE HCL 5 MG PO TABS: ORAL_TABLET | ORAL | 3 refills | Status: DC

## 2017-10-28 MED ORDER — LINACLOTIDE 290 MCG PO CAPS: 290.0000 ug | ORAL_CAPSULE | Freq: Every day | ORAL | 3 refills | Status: DC

## 2017-10-28 NOTE — Patient Instructions (Addendum)
We have sent the following medications to your pharmacy for you to pick up at your convenience: Linzess 290 mg daily Reglan 5 mg tablets- Take 1 tablet three times daily before meals and at bedtime as needed  Continue Pantoprazole.  Please follow up with Dr Rhea BeltonPyrtle in 1 year.  If you are age 62 or older, your body mass index should be between 23-30. Your Body mass index is 34.33 kg/m. If this is out of the aforementioned range listed, please consider follow up with your Primary Care Provider.  If you are age 62 or younger, your body mass index should be between 19-25. Your Body mass index is 34.33 kg/m. If this is out of the aformentioned range listed, please consider follow up with your Primary Care Provider.

## 2017-10-29 ENCOUNTER — Encounter: Payer: Self-pay | Admitting: Internal Medicine

## 2017-10-29 NOTE — Progress Notes (Signed)
Subjective:    Patient ID: [name], female    DOB: [birth year], 62 y.o.   MRN: [id number]  HPI [name] is a 62 year old female with a history of GERD, gastroparesis, chronic constipation, sleep apnea, hypertension, diabetes who is seen in follow-up.  She was last seen in the office on 06/17/2015.  She is here alone today.  She reports that her gastroparesis has been controlled though she does occasionally use liquid Reglan 5 mg.  She uses this less than once weekly for occasional nausea, early satiety and abdominal bloating.  The syrup/liquid version of this medication is "too sweet" and she requests a tablet version.  She had been on Linzess 290 mcg daily which works very well for her constipation though this medication had been too expensive.  She has been out of this medication for some time and is using magnesium citrate on occasion for bowel movement.  Without medication she will have a bowel movement 1 maybe 2 days/week.  No blood in her stool or melena.  Her reflux has been well controlled with pantoprazole which she uses 40 mg daily.  She denies dysphagia and odynophagia.  She is troubled by diabetic neuropathy and bilateral leg discomfort on a near daily basis.  She takes Celexa, atorvastatin, cyclobenzaprine, gabapentin, losartan, metformin, Victoza and insulin aspart.  She uses tramadol on occasion for pain.  She had a normal colonoscopy on 01-12-12.  She denies a family history of colon cancer.   Review of Systems As per HPI, otherwise negative  Current Medications, Allergies, Past Medical History, Past Surgical History, Family History and Social History were reviewed in [employer] record.      Objective:   Physical Exam BP 118/64 (BP Location: Left Arm, Patient Position: Sitting, Cuff Size: Normal)   Pulse 60   Ht 5' 5.5" (1.664 m) Comment: height measured without shoes  Wt 209 lb 8 oz (95 kg)   BMI 34.33 kg/m  Constitutional:  Well-developed and well-nourished. No distress. HEENT: Normocephalic and atraumatic. Oropharynx is clear and moist.   No scleral icterus. Neck: Neck supple. Trachea midline. Cardiovascular: Normal rate, regular rhythm and intact distal pulses.  Pulmonary/chest: Effort normal and breath sounds normal. No wheezing, rales or rhonchi. Abdominal: Soft, nontender, nondistended. Bowel sounds active throughout.  Extremities: no clubbing, cyanosis, or edema Neurological: Alert and oriented to person place and time. Skin: Skin is warm and dry. Psychiatric: Normal mood and affect. Behavior is normal.     Assessment & Plan:   62 year old female with a history of GERD, gastroparesis, chronic constipation, sleep apnea, hypertension, diabetes who is seen in follow-up.    1.  GERD --well-controlled with pantoprazole 40 mg once daily.  No alarm symptoms.  We will continue current therapy   2.  Gastroparesis --largely controlled but occasionally symptomatic for which she uses metoclopramide.  We have discussed metoclopramide including the long-term potential risk of this medication and tardive dyskinesia.  She is using this infrequently and so this is very unlikely.  We will prescribe metoclopramide 5 mg tablet to be used up to 3 times daily before meals and at bedtime as needed.  I encouraged her to use this as infrequently as possible.  3.  Chronic constipation --will prescribe Linzess  290 mcg daily, #90 with 3 refills.  This medicine has worked very well for her hopefully will be affordable for her and if not I asked that she let me know.  Office follow-up in 1 year 15  minutes spent with the patient today. Greater than 50% was spent in counseling and coordination of care with the patient

## 2017-11-08 DIAGNOSIS — R69 Illness, unspecified: Secondary | ICD-10-CM | POA: Diagnosis not present

## 2017-11-21 ENCOUNTER — Encounter (HOSPITAL_COMMUNITY): Payer: Self-pay | Admitting: Emergency Medicine

## 2017-11-21 ENCOUNTER — Emergency Department (HOSPITAL_COMMUNITY): Payer: Medicare HMO

## 2017-11-21 ENCOUNTER — Other Ambulatory Visit: Payer: Self-pay

## 2017-11-21 ENCOUNTER — Inpatient Hospital Stay (HOSPITAL_COMMUNITY)
Admission: EM | Admit: 2017-11-21 | Discharge: 2017-11-23 | DRG: 153 | Disposition: A | Discharge status: Home / Self Care | Payer: Medicare HMO | Attending: Internal Medicine | Admitting: Internal Medicine

## 2017-11-21 DIAGNOSIS — K0889 Other specified disorders of teeth and supporting structures: Secondary | ICD-10-CM | POA: Diagnosis present

## 2017-11-21 DIAGNOSIS — Z6834 Body mass index (BMI) 34.0-34.9, adult: Secondary | ICD-10-CM

## 2017-11-21 DIAGNOSIS — F419 Anxiety disorder, unspecified: Secondary | ICD-10-CM | POA: Diagnosis present

## 2017-11-21 DIAGNOSIS — R220 Localized swelling, mass and lump, head: Secondary | ICD-10-CM | POA: Diagnosis not present

## 2017-11-21 DIAGNOSIS — J018 Other acute sinusitis: Principal | ICD-10-CM | POA: Diagnosis present

## 2017-11-21 DIAGNOSIS — J449 Chronic obstructive pulmonary disease, unspecified: Secondary | ICD-10-CM | POA: Diagnosis present

## 2017-11-21 DIAGNOSIS — Z90710 Acquired absence of both cervix and uterus: Secondary | ICD-10-CM | POA: Diagnosis not present

## 2017-11-21 DIAGNOSIS — E669 Obesity, unspecified: Secondary | ICD-10-CM | POA: Diagnosis present

## 2017-11-21 DIAGNOSIS — K08409 Partial loss of teeth, unspecified cause, unspecified class: Secondary | ICD-10-CM | POA: Diagnosis not present

## 2017-11-21 DIAGNOSIS — R509 Fever, unspecified: Secondary | ICD-10-CM | POA: Diagnosis not present

## 2017-11-21 DIAGNOSIS — Z79899 Other long term (current) drug therapy: Secondary | ICD-10-CM | POA: Diagnosis not present

## 2017-11-21 DIAGNOSIS — D649 Anemia, unspecified: Secondary | ICD-10-CM | POA: Diagnosis present

## 2017-11-21 DIAGNOSIS — F329 Major depressive disorder, single episode, unspecified: Secondary | ICD-10-CM | POA: Diagnosis present

## 2017-11-21 DIAGNOSIS — E876 Hypokalemia: Secondary | ICD-10-CM | POA: Diagnosis present

## 2017-11-21 DIAGNOSIS — E1143 Type 2 diabetes mellitus with diabetic autonomic (poly)neuropathy: Secondary | ICD-10-CM | POA: Diagnosis present

## 2017-11-21 DIAGNOSIS — I10 Essential (primary) hypertension: Secondary | ICD-10-CM | POA: Diagnosis not present

## 2017-11-21 DIAGNOSIS — E1140 Type 2 diabetes mellitus with diabetic neuropathy, unspecified: Secondary | ICD-10-CM | POA: Diagnosis present

## 2017-11-21 DIAGNOSIS — Z87891 Personal history of nicotine dependence: Secondary | ICD-10-CM

## 2017-11-21 DIAGNOSIS — K3184 Gastroparesis: Secondary | ICD-10-CM | POA: Diagnosis present

## 2017-11-21 DIAGNOSIS — Z833 Family history of diabetes mellitus: Secondary | ICD-10-CM

## 2017-11-21 DIAGNOSIS — E785 Hyperlipidemia, unspecified: Secondary | ICD-10-CM | POA: Diagnosis present

## 2017-11-21 DIAGNOSIS — Z7982 Long term (current) use of aspirin: Secondary | ICD-10-CM | POA: Diagnosis not present

## 2017-11-21 DIAGNOSIS — Z9109 Other allergy status, other than to drugs and biological substances: Secondary | ICD-10-CM | POA: Diagnosis not present

## 2017-11-21 DIAGNOSIS — J329 Chronic sinusitis, unspecified: Secondary | ICD-10-CM | POA: Diagnosis present

## 2017-11-21 DIAGNOSIS — R51 Headache: Secondary | ICD-10-CM | POA: Diagnosis not present

## 2017-11-21 DIAGNOSIS — K219 Gastro-esophageal reflux disease without esophagitis: Secondary | ICD-10-CM | POA: Diagnosis present

## 2017-11-21 DIAGNOSIS — G4733 Obstructive sleep apnea (adult) (pediatric): Secondary | ICD-10-CM | POA: Diagnosis not present

## 2017-11-21 DIAGNOSIS — K047 Periapical abscess without sinus: Secondary | ICD-10-CM

## 2017-11-21 DIAGNOSIS — Z794 Long term (current) use of insulin: Secondary | ICD-10-CM

## 2017-11-21 DIAGNOSIS — Z7951 Long term (current) use of inhaled steroids: Secondary | ICD-10-CM | POA: Diagnosis not present

## 2017-11-21 DIAGNOSIS — J3489 Other specified disorders of nose and nasal sinuses: Secondary | ICD-10-CM | POA: Diagnosis not present

## 2017-11-21 DIAGNOSIS — E119 Type 2 diabetes mellitus without complications: Secondary | ICD-10-CM | POA: Diagnosis not present

## 2017-11-21 LAB — CBC WITH DIFFERENTIAL/PLATELET
Basophils Absolute: 0 10*3/uL (ref 0.0–0.1)
Basophils Relative: 0 %
Eosinophils Absolute: 0.1 10*3/uL (ref 0.0–0.7)
Eosinophils Relative: 2 %
HCT: 36.9 % (ref 36.0–46.0)
Hemoglobin: 11.5 g/dL — ABNORMAL LOW (ref 12.0–15.0)
Lymphocytes Relative: 16 %
Lymphs Abs: 1.1 10*3/uL (ref 0.7–4.0)
MCH: 26.6 pg (ref 26.0–34.0)
MCHC: 31.2 g/dL (ref 30.0–36.0)
MCV: 85.2 fL (ref 78.0–100.0)
Monocytes Absolute: 0.5 10*3/uL (ref 0.1–1.0)
Monocytes Relative: 7 %
Neutro Abs: 5.1 10*3/uL (ref 1.7–7.7)
Neutrophils Relative %: 75 %
Platelets: 265 10*3/uL (ref 150–400)
RBC: 4.33 MIL/uL (ref 3.87–5.11)
RDW: 13.9 % (ref 11.5–15.5)
WBC: 6.9 10*3/uL (ref 4.0–10.5)

## 2017-11-21 LAB — BASIC METABOLIC PANEL
Anion gap: 10 (ref 5–15)
BUN: 6 mg/dL (ref 6–20)
CO2: 26 mmol/L (ref 22–32)
Calcium: 9.4 mg/dL (ref 8.9–10.3)
Chloride: 103 mmol/L (ref 101–111)
Creatinine, Ser: 0.72 mg/dL (ref 0.44–1.00)
GFR calc Af Amer: >60 mL/min (ref >60)
GFR calc non Af Amer: >60 mL/min (ref >60)
Glucose, Bld: 182 mg/dL — ABNORMAL HIGH (ref 65–99)
Potassium: 3.7 mmol/L (ref 3.5–5.1)
Sodium: 139 mmol/L (ref 135–145)

## 2017-11-21 LAB — I-STAT CHEM 8, ED
BUN: 4 mg/dL — ABNORMAL LOW (ref 6–20)
Calcium, Ion: 1.22 mmol/L (ref 1.15–1.40)
Chloride: 99 mmol/L — ABNORMAL LOW (ref 101–111)
Creatinine, Ser: 0.7 mg/dL (ref 0.44–1.00)
Glucose, Bld: 231 mg/dL — ABNORMAL HIGH (ref 65–99)
HCT: 37 % (ref 36.0–46.0)
Hemoglobin: 12.6 g/dL (ref 12.0–15.0)
Potassium: 3.6 mmol/L (ref 3.5–5.1)
Sodium: 140 mmol/L (ref 135–145)
TCO2: 29 mmol/L (ref 22–32)

## 2017-11-21 LAB — LACTIC ACID, PLASMA: Lactic Acid, Venous: 1.3 mmol/L (ref 0.5–1.9)

## 2017-11-21 MED ORDER — IOPAMIDOL (ISOVUE-300) INJECTION 61%
75.0000 mL | Freq: Once | INTRAVENOUS | Status: AC | PRN
  Administered 2017-11-21: 75 mL via INTRAVENOUS

## 2017-11-21 MED ORDER — INSULIN ASPART 100 UNIT/ML ~~LOC~~ SOLN: 0.0000 [IU] | Freq: Every day | SUBCUTANEOUS | Status: DC

## 2017-11-21 MED ORDER — ALPRAZOLAM 0.5 MG PO TABS
0.5000 mg | ORAL_TABLET | Freq: Two times a day (BID) | ORAL | Status: DC | PRN
Start: 2017-11-21 — End: 2017-11-23
  Administered 2017-11-22: 0.5 mg via ORAL
  Filled 2017-11-21: qty 1

## 2017-11-21 MED ORDER — ACETAMINOPHEN 650 MG RE SUPP: 650.0000 mg | Freq: Four times a day (QID) | RECTAL | Status: DC | PRN

## 2017-11-21 MED ORDER — ATORVASTATIN CALCIUM 20 MG PO TABS
20.0000 mg | ORAL_TABLET | Freq: Every evening | ORAL | Status: DC
  Administered 2017-11-22 (×2): 20 mg via ORAL
  Filled 2017-11-21 (×2): qty 1

## 2017-11-21 MED ORDER — PANTOPRAZOLE SODIUM 40 MG PO TBEC
40.0000 mg | DELAYED_RELEASE_TABLET | Freq: Every day | ORAL | Status: DC
  Administered 2017-11-22 – 2017-11-23 (×2): 40 mg via ORAL
  Filled 2017-11-21 (×2): qty 1

## 2017-11-21 MED ORDER — CLINDAMYCIN PHOSPHATE 600 MG/50ML IV SOLN
600.0000 mg | Freq: Three times a day (TID) | INTRAVENOUS | Status: DC
  Administered 2017-11-22 – 2017-11-23 (×4): 600 mg via INTRAVENOUS
  Filled 2017-11-21 (×5): qty 50

## 2017-11-21 MED ORDER — LOSARTAN POTASSIUM 50 MG PO TABS
100.0000 mg | ORAL_TABLET | Freq: Every day | ORAL | Status: DC
  Administered 2017-11-22 – 2017-11-23 (×2): 100 mg via ORAL
  Filled 2017-11-21 (×2): qty 2

## 2017-11-21 MED ORDER — OXYCODONE HCL 5 MG PO TABS
5.0000 mg | ORAL_TABLET | Freq: Four times a day (QID) | ORAL | Status: DC | PRN
  Administered 2017-11-22 (×3): 5 mg via ORAL
  Filled 2017-11-21 (×3): qty 1

## 2017-11-21 MED ORDER — ACETAMINOPHEN 325 MG PO TABS: 650.0000 mg | ORAL_TABLET | Freq: Four times a day (QID) | ORAL | Status: DC | PRN

## 2017-11-21 MED ORDER — INSULIN ASPART 100 UNIT/ML ~~LOC~~ SOLN: 0.0000 [IU] | Freq: Three times a day (TID) | SUBCUTANEOUS | Status: DC

## 2017-11-21 MED ORDER — ONDANSETRON HCL 4 MG PO TABS: 4.0000 mg | ORAL_TABLET | Freq: Four times a day (QID) | ORAL | Status: DC | PRN

## 2017-11-21 MED ORDER — ONDANSETRON HCL 4 MG/2ML IJ SOLN: 4.0000 mg | Freq: Four times a day (QID) | INTRAMUSCULAR | Status: DC | PRN

## 2017-11-21 MED ORDER — ENOXAPARIN SODIUM 40 MG/0.4ML ~~LOC~~ SOLN
40.0000 mg | Freq: Every day | SUBCUTANEOUS | Status: DC
Start: 2017-11-21 — End: 2017-11-23
  Administered 2017-11-22 (×2): 40 mg via SUBCUTANEOUS
  Filled 2017-11-21 (×2): qty 0.4

## 2017-11-21 MED ORDER — CLINDAMYCIN PHOSPHATE 900 MG/50ML IV SOLN
900.0000 mg | Freq: Once | INTRAVENOUS | Status: AC
  Administered 2017-11-21: 900 mg via INTRAVENOUS
  Filled 2017-11-21: qty 50

## 2017-11-21 MED ORDER — SODIUM CHLORIDE 0.9 % IV SOLN
INTRAVENOUS | Status: DC
  Administered 2017-11-21: 23:00:00 via INTRAVENOUS

## 2017-11-21 MED ORDER — OXYCODONE-ACETAMINOPHEN 5-325 MG PO TABS
1.0000 | ORAL_TABLET | Freq: Once | ORAL | Status: AC
  Administered 2017-11-21: 1 via ORAL
  Filled 2017-11-21: qty 1

## 2017-11-21 MED ORDER — GABAPENTIN 300 MG PO CAPS
300.0000 mg | ORAL_CAPSULE | Freq: Three times a day (TID) | ORAL | Status: DC
  Administered 2017-11-22 – 2017-11-23 (×5): 300 mg via ORAL
  Filled 2017-11-21 (×5): qty 1

## 2017-11-21 MED ORDER — ASPIRIN EC 81 MG PO TBEC
81.0000 mg | DELAYED_RELEASE_TABLET | Freq: Two times a day (BID) | ORAL | Status: DC
  Administered 2017-11-22 – 2017-11-23 (×4): 81 mg via ORAL
  Filled 2017-11-21 (×4): qty 1

## 2017-11-21 MED ORDER — SODIUM CHLORIDE 0.9 % IV SOLN
INTRAVENOUS | Status: DC
  Administered 2017-11-22 – 2017-11-23 (×4): via INTRAVENOUS

## 2017-11-21 MED ORDER — METOCLOPRAMIDE HCL 5 MG PO TABS
5.0000 mg | ORAL_TABLET | Freq: Three times a day (TID) | ORAL | Status: DC
  Administered 2017-11-22 – 2017-11-23 (×4): 5 mg via ORAL
  Filled 2017-11-21 (×5): qty 1

## 2017-11-21 NOTE — ED Triage Notes (Signed)
Pt reports having sinus pressure and pain. Pt reports having teeth pulled 3 weeks ago and continues to have pain.

## 2017-11-21 NOTE — ED Provider Notes (Signed)
[facility] DEPT Provider Note   CSN: [id number] Arrival date & time: 11/21/17  1812     History   Chief Complaint Chief Complaint  Patient presents with  . Sinus Problem    HPI [name] is a 62 y.o. female with hx of DM, asthma and multiple other chronic health problems who presents to the ED with pain and fever. Patient reports that 3 weeks ago she had 5 teeth extracted from the upper front area. She had the surgery done at a clinic at [facility]. She was given Augmentin but reports that she has continued to have pain, increased swelling since then. Over the past few days patient reports increased swelling and pain to the left side of the face, fever, chills and sinus pressure.    HPI  Past Medical History:  Diagnosis Date  . Anxiety   . Arthritis    "both shoulders"  . Asthma   . Blood transfusion without reported diagnosis   . Chronic bronchitis ([place])   . Cold extremity without peripheral vascular disease    bilaterally; "from my knees down into my feet; they get ice cold"  . COPD (chronic obstructive pulmonary disease) ([place])   . Environmental allergies    "year round"  . Exertional dyspnea   . Gastroparesis   . GERD (gastroesophageal reflux disease)   . Headache(784.0)   . Heart murmur   . Hiatal hernia   . High cholesterol   . HTN (hypertension)   . Obesity   . Rotator cuff tear   . Sciatic nerve pain    "tailbone down into my legs when it flares up"  . Sciatica   . Sleep apnea    CPAP, sleep study on [place]  . Type II diabetes mellitus ([place])   . Vocal cord dysfunction    "I've had it for many years"    Patient Active Problem List   Diagnosis Date Noted  . Irritable larynx syndrome 09/18/2015  . ILD (interstitial lung disease) ([place]) 10/22/2014  . Loss of weight 05/08/2014  . Rotator cuff tear 01/29/2012  . Chest pain 08/26/2011  . Allergic rhinitis, cause unspecified 08/08/2011  . Obstructive sleep apnea 08/08/2011   . Chronic cough 02/18/2011  . Dyspnea 02/18/2011  . DIABETES MELLITUS, TYPE II 05/26/2007  . ANXIETY, SITUATIONAL 05/26/2007  . HYPERTENSION 05/26/2007  . Asthma, chronic 05/26/2007  . GERD 05/26/2007  . LIPID DISORDER, HX OF 05/26/2007    Past Surgical History:  Procedure Laterality Date  . ABDOMINAL ADHESION SURGERY  ~ 1978  . APPENDECTOMY  1976  . NASAL SINUS SURGERY  1992  . OOPHORECTOMY  1976   w/ovarian cyst excision and appy  . SHOULDER ARTHROSCOPY W/ ROTATOR CUFF REPAIR  01/28/12   left  . TOE SURGERY     for ingrown [id number] ?left  . TOTAL ABDOMINAL HYSTERECTOMY  1985     OB History   None      Home Medications    Prior to Admission medications   Medication Sig Start Date End Date Taking? Authorizing Provider  albuterol (PROVENTIL HFA;VENTOLIN HFA) 108 (90 BASE) MCG/ACT inhaler Inhale 2 puffs into the lungs every 4 (four) hours as needed for wheezing or shortness of breath.     [provider]  ALPRAZolam Duanne Moron) 0.5 MG tablet Take 0.5 mg by mouth 2 (two) times daily as needed.     [provider]  aspirin 81 MG tablet Take 81 mg by mouth 2 (two)  times daily.     [provider]  atorvastatin (LIPITOR) 20 MG tablet Take 20 mg by mouth every evening.     [provider]  Azelastine-Fluticasone (DYMISTA) 137-50 MCG/ACT SUSP Place into the nose daily.    [provider]  citalopram (CELEXA) 10 MG tablet Take 1 tablet by mouth daily. 05/08/13   [provider]  cyclobenzaprine (FLEXERIL) 10 MG tablet Take 1 tablet by mouth as needed.    [provider]  gabapentin (NEURONTIN) 300 MG capsule Take 1 capsule by mouth 3 (three) times daily.  05/08/13   [provider]  insulin aspart protamine - aspart (NOVOLOG MIX 70/30 FLEXPEN) (70-30) 100 UNIT/ML FlexPen Novolog Mix 70-30 FlexPen U-100 Insulin 100 unit/mL subcutaneous pen  INJECT 30 UNITS UNDER THE SKIN BID    [provider]    linaclotide (LINZESS) 290 MCG CAPS capsule Take 1 capsule (290 mcg total) by mouth daily before breakfast. 10/28/17   Pyrtle, Lajuan Lines, MD  loratadine (CLARITIN) 10 MG tablet Take 10 mg by mouth every morning.    [provider]  losartan (COZAAR) 100 MG tablet Take 1 tablet by mouth daily.    [provider]  metFORMIN (GLUCOPHAGE) 1000 MG tablet Take 1 tablet by mouth 2 (two) times daily. 04/15/13   [provider]  metoCLOPramide (REGLAN) 5 MG tablet Take 1 tablet by mouth before meals and at bedtime as needed 10/28/17   Pyrtle, Lajuan Lines, MD  pantoprazole (PROTONIX) 40 MG tablet Take 1 tablet (40 mg total) by mouth 2 (two) times daily. Patient taking differently: Take 40 mg by mouth daily.  06/17/15   Hvozdovic, Lori P, PA-C  traMADol (ULTRAM) 50 MG tablet Take 50 mg by mouth every 6 (six) hours as needed for pain.     [provider]  VICTOZA 18 MG/3ML SOPN Inject 18 mg into the skin every morning.  03/14/13   [provider]    Family History Family History  Problem Relation Age of Onset  . Heart disease Mother   . Asthma Mother   . Heart failure Maternal Grandmother   . Asthma Son   . Asthma Daughter   . Lupus Daughter   . Diabetes Unknown        "everybody on both sides"  . Colon cancer Neg Hx   . Throat cancer Neg Hx   . Stomach cancer Neg Hx   . Esophageal cancer Neg Hx   . Rectal cancer Neg Hx     Social History Social History   Tobacco Use  . Smoking status: Former Smoker    Packs/day: 1.00    Years: 25.00    Pack years: 25.00    Types: Cigarettes    Last attempt to quit: 07/13/1996    Years since quitting: 21.3  . Smokeless tobacco: Never Used  Substance Use Topics  . Alcohol use: No    Comment: 01/29/12 "used to abuse alcohol; last drink was in the 1980's"  . Drug use: No     Allergies   Lisinopril   Review of Systems Review of Systems  Constitutional: Positive for chills and fever.  HENT: Positive for dental problem,  facial swelling, sinus pressure and sinus pain.   Eyes: Positive for redness (left).       Watering   Respiratory: Negative for shortness of breath.   Cardiovascular: Negative for chest pain.  Gastrointestinal: Negative for abdominal pain and vomiting.  Musculoskeletal: Negative for neck stiffness.  Skin:  Negative for rash.  Neurological: Positive for headaches.  Psychiatric/Behavioral: Negative for confusion.     Physical Exam Updated Vital Signs BP 131/63 (BP Location: Right Arm)   Pulse 72   Temp (!) 100.9 F (38.3 C) (Oral)   Resp 14   Ht '5\' 6"'$  (1.676 m)   Wt 96.2 kg (212 lb)   SpO2 98%   BMI 34.22 kg/m   Physical Exam  Constitutional: She appears well-developed and well-nourished. No distress.  HENT:  Nose: Right sinus exhibits maxillary sinus tenderness. Left sinus exhibits maxillary sinus tenderness.  Dental extractions. Facial swelling to the left side of the face.   Eyes: EOM are normal. Left conjunctiva is injected.  Left eye is watery.  Neck: Normal range of motion. Neck supple.  Cardiovascular: Normal rate.  Pulmonary/Chest: Effort normal.  Musculoskeletal: Normal range of motion.  Lymphadenopathy:    She has cervical adenopathy (left).  Neurological: She is alert.  Skin: Skin is warm and dry.  Psychiatric: She has a normal mood and affect.  Nursing note and vitals reviewed.    ED Treatments / Results  Labs (all labs ordered are listed, but only abnormal results are displayed) Labs Reviewed  I-STAT CHEM 8, ED - Abnormal; Notable for the following components:      Result Value   Chloride 99 (*)    BUN 4 (*)    Glucose, Bld 231 (*)    All other components within normal limits  CULTURE, BLOOD (ROUTINE X 2)  CULTURE, BLOOD (ROUTINE X 2)  CBC WITH DIFFERENTIAL/PLATELET  BASIC METABOLIC PANEL  LACTIC ACID, PLASMA  LACTIC ACID, PLASMA    Radiology Ct Maxillofacial W Contrast  Result Date: 11/21/2017 CLINICAL DATA:  62 year old female with pain  following dental extractions 3 weeks ago. Sinus pain, pressure, headache. EXAM: CT MAXILLOFACIAL WITH CONTRAST TECHNIQUE: Multidetector CT imaging of the maxillofacial structures was performed with intravenous contrast. Multiplanar CT image reconstructions were also generated. CONTRAST:  36m ISOVUE-300 IOPAMIDOL (ISOVUE-300) INJECTION 61% COMPARISON:  Neck CT with contrast 04/24/2013. FINDINGS: Osseous: Multiple prior dental extractions. Recent appearing extractions at the right maxillary molar and bicuspid dentition, left maxillary molar dentition. Bilateral anterior molar extraction sites appear to communicate with the maxillary sinuses, more so on the right (sagittal image 7). Furthermore, there is a sclerotic tooth or bone fragment within the left maxillary sinus measuring 6 millimeters which was not present in 2014. See additional sinus findings below. Intact mandible. Zygoma and other facial bones are intact. Intact central skull base and visible cervical spine. Orbits: Intact orbital walls. Visualized orbit soft tissues are within normal limits. Sinuses: Abnormal floor of the left maxillary sinus as stated above, including a 6 millimeter bone or tooth fragment as seen on series 3, image 46. Subtotal opacification of the left maxillary sinus with a fluid level (series 2, image 34). Associated widespread left ethmoid mucosal thickening and opacification. Mild involvement of the left frontoethmoidal recess. The sinuses were clear in 2014. The right maxillary sinus has a more normal appearance, there is mild right alveolar recess mucosal thickening. The frontal sinuses and sphenoid sinuses remain well pneumatized. Bilateral tympanic cavities and mastoids are clear. Soft tissues: Negative visible larynx, pharynx, parapharyngeal spaces, retropharyngeal space, sublingual space, submandibular spaces, parotid spaces, and masticator spaces. Major vascular structures in the neck and at the skull base are patent. No  cervical lymphadenopathy. Limited intracranial: Negative visualized brain parenchyma. IMPRESSION: 1. Evidence of multiple recent maxillary tooth extractions. The anterior left maxillary molar extraction site  defect communicates with the left maxillary sinus, there is a 6 mm tooth fragment within the floor of that sinus (series 7, image 58), and there is associated acute left maxillary and ethmoid paranasal sinusitis. 2. Mild mucosal thickening in the floor of the right maxillary sinus. Electronically Signed   By: Genevie Ann M.D.   On: 11/21/2017 21:33    Procedures Procedures (including critical care time)  Medications Ordered in ED Medications  0.9 %  sodium chloride infusion (has no administration in time range)  clindamycin (CLEOCIN) IVPB 900 mg (has no administration in time range)  oxyCODONE-acetaminophen (PERCOCET/ROXICET) 5-325 MG per tablet 1 tablet (1 tablet Oral Given 11/21/17 2024)  iopamidol (ISOVUE-300) 61 % injection 75 mL (75 mLs Intravenous Contrast Given 11/21/17 2102)     Initial Impression / Assessment and Plan / ED Course  I have reviewed the triage vital signs and the nursing notes. Care turned over to Dr. Sherry Ruffing @ 10:30 pm awaiting consult with hospitalist.   Final Clinical Impressions(s) / ED Diagnoses   ED Discharge Orders    None       Debroah Baller [place], NP 11/21/17 [place]    Tegeler, Gwenyth Allegra, MD 11/22/17 [phone]

## 2017-11-21 NOTE — H&P (Addendum)
History and Physical    [name] [id number] DOB: 16-Jul-1955 DOA: 11/21/2017  Referring MD/NP/PA: Dr. Harrell Gave Tegeler PCP: Wenda Low, MD  Patient coming from: Home  Chief Complaint: Tooth pain  I have personally briefly reviewed patient's old medical records in [place]   HPI: [name] is a 62 y.o. female with medical history significant of HTN, HLD,COPD, DM type II with gastroparesis, vocal cord dysfunction, and GERD; who presents with complaints of left-sided tooth pain. Patient had dental extraction of 5 teeth approximately 3 weeks ago at the [facility] clinic.  She reports that it is a same day clinic where teeth are extracted and partials are given.  She was given a week of Augmentin and pain medication of Ultram following the procedure.  Patient took Augmentin as prescribed. However, since that time has continued swelling and pain of the left side of face with associated intermittent fever, chills, sinus pressure, and pain.  Tried doubling up on her tramadol medication without relief.  She has had decreased p.o. intake due to pain symptoms with chewing.  Denies any loss of consciousness, difficulty swallowing, chest pain, nausea, or vomiting symptoms.  Patient contacted her PCP regarding the issue but came here, but came to the emergency department when symptoms progressively worsened.  She notes having similar symptoms after sinus surgery years ago.  ED Course:  Upon admission into the emergency department patient was noted to be febrile up to 100.9, blood pressure up to 171/74, and all other vitals are relatively within normal limits.  Initial lab work included a i-stat H&H and Chem 8.  CBC was ordered. Maxillofacial CT scan showed recent tooth extractions with signs of acute left maxillary and ethmoid paranasal sinusitis.  Patient was given clindamycin IV and ENT was consulted.  TRH called to admit   Review of Systems  Constitutional: Positive for  chills and fever.  HENT: Positive for sinus pain. Negative for nosebleeds.        Positive for jaw pain  Eyes: Negative for photophobia and pain.  Respiratory: Negative for cough, shortness of breath and wheezing.   Cardiovascular: Negative for chest pain and leg swelling.  Gastrointestinal: Negative for abdominal pain, nausea and vomiting.  Genitourinary: Negative for dysuria and hematuria.  Musculoskeletal: Negative for back pain and myalgias.  Skin: Negative for itching and rash.  Neurological: Positive for headaches. Negative for focal weakness and loss of consciousness.  Psychiatric/Behavioral: Negative for substance abuse and suicidal ideas.    Past Medical History:  Diagnosis Date  . Anxiety   . Arthritis    "both shoulders"  . Asthma   . Blood transfusion without reported diagnosis   . Chronic bronchitis ([place])   . Cold extremity without peripheral vascular disease    bilaterally; "from my knees down into my feet; they get ice cold"  . COPD (chronic obstructive pulmonary disease) ([place])   . Environmental allergies    "year round"  . Exertional dyspnea   . Gastroparesis   . GERD (gastroesophageal reflux disease)   . Headache(784.0)   . Heart murmur   . Hiatal hernia   . High cholesterol   . HTN (hypertension)   . Obesity   . Rotator cuff tear   . Sciatic nerve pain    "tailbone down into my legs when it flares up"  . Sciatica   . Sleep apnea    CPAP, sleep study on [place]  . Type II diabetes mellitus ([place])   .  Vocal cord dysfunction    "I've had it for many years"    Past Surgical History:  Procedure Laterality Date  . ABDOMINAL ADHESION SURGERY  ~ 1978  . APPENDECTOMY  1976  . NASAL SINUS SURGERY  1992  . OOPHORECTOMY  1976   w/ovarian cyst excision and appy  . SHOULDER ARTHROSCOPY W/ ROTATOR CUFF REPAIR  01/28/12   left  . TOE SURGERY     for ingrown [id number] ?left  . TOTAL ABDOMINAL HYSTERECTOMY  1985     reports that she quit smoking about 21  years ago. Her smoking use included cigarettes. She has a 25.00 pack-year smoking history. She has never used smokeless tobacco. She reports that she does not drink alcohol or use drugs.  Allergies  Allergen Reactions  . Lisinopril Cough    Family History  Problem Relation Age of Onset  . Heart disease Mother   . Asthma Mother   . Heart failure Maternal Grandmother   . Asthma Son   . Asthma Daughter   . Lupus Daughter   . Diabetes Unknown        "everybody on both sides"  . Colon cancer Neg Hx   . Throat cancer Neg Hx   . Stomach cancer Neg Hx   . Esophageal cancer Neg Hx   . Rectal cancer Neg Hx     Prior to Admission medications   Medication Sig Start Date End Date Taking? Authorizing Provider  albuterol (PROVENTIL HFA;VENTOLIN HFA) 108 (90 BASE) MCG/ACT inhaler Inhale 2 puffs into the lungs every 4 (four) hours as needed for wheezing or shortness of breath.     [provider]  ALPRAZolam Duanne Moron) 0.5 MG tablet Take 0.5 mg by mouth 2 (two) times daily as needed.     [provider]  aspirin 81 MG tablet Take 81 mg by mouth 2 (two) times daily.     [provider]  atorvastatin (LIPITOR) 20 MG tablet Take 20 mg by mouth every evening.     [provider]  Azelastine-Fluticasone (DYMISTA) 137-50 MCG/ACT SUSP Place into the nose daily.    [provider]  citalopram (CELEXA) 10 MG tablet Take 1 tablet by mouth daily. 05/08/13   [provider]  cyclobenzaprine (FLEXERIL) 10 MG tablet Take 1 tablet by mouth as needed.    [provider]  gabapentin (NEURONTIN) 300 MG capsule Take 1 capsule by mouth 3 (three) times daily.  05/08/13   [provider]  insulin aspart protamine - aspart (NOVOLOG MIX 70/30 FLEXPEN) (70-30) 100 UNIT/ML FlexPen Novolog Mix 70-30 FlexPen U-100 Insulin 100 unit/mL subcutaneous pen  INJECT 30 UNITS UNDER THE SKIN BID    [provider]  linaclotide (LINZESS) 290 MCG CAPS capsule  Take 1 capsule (290 mcg total) by mouth daily before breakfast. 10/28/17   Pyrtle, Lajuan Lines, MD  loratadine (CLARITIN) 10 MG tablet Take 10 mg by mouth every morning.    [provider]  losartan (COZAAR) 100 MG tablet Take 1 tablet by mouth daily.    [provider]  metFORMIN (GLUCOPHAGE) 1000 MG tablet Take 1 tablet by mouth 2 (two) times daily. 04/15/13   [provider]  metoCLOPramide (REGLAN) 5 MG tablet Take 1 tablet by mouth before meals and at bedtime as needed 10/28/17   Pyrtle, Lajuan Lines, MD  pantoprazole (PROTONIX) 40 MG tablet Take 1 tablet (40 mg total) by mouth 2 (two) times daily. Patient taking differently: Take 40 mg by mouth daily.  06/17/15   Hvozdovic, Lori P, PA-C  traMADol (ULTRAM) 50 MG tablet Take 50 mg by mouth every 6 (six) hours as needed for pain.     [provider]  VICTOZA 18 MG/3ML SOPN Inject 18 mg into the skin every morning.  03/14/13   [provider]    Physical Exam:  Constitutional: NAD, calm, comfortable Vitals:   11/21/17 1856 11/21/17 1921 11/21/17 2130  BP: (!) 171/74  131/63  Pulse: 77  72  Resp: 18  14  Temp: (!) 100.9 F (38.3 C)    TempSrc: Oral    SpO2: 96%  98%  Weight:  96.2 kg (212 lb)   Height:  '5\' 6"'$  (1.676 m)    Eyes: PERRL, lids and conjunctivae normal ENMT: Mucous membranes are moist swelling noted of the left upper and lower jawline where tooth extractions had taken place. Posterior pharynx clear of any exudate or lesions.bleeding sockets noted in places of recently removed teeth. Neck: Left sided submandibular adenopathy with mild swelling of the face noted.  No stridor noted. Respiratory: clear to auscultation bilaterally, no wheezing, no crackles. Normal respiratory effort. No accessory muscle use.  Cardiovascular: Regular rate and rhythm, no murmurs / rubs / gallops. No extremity edema. 2+ pedal pulses. No carotid bruits.  Abdomen: no tenderness, no masses palpated. No hepatosplenomegaly.  Bowel sounds positive.  Musculoskeletal: no clubbing / cyanosis. No joint deformity upper and lower extremities. Good ROM, no contractures. Normal muscle tone.  Skin: no rashes, lesions, ulcers. No induration Neurologic: CN 2-12 grossly intact. Sensation intact, DTR normal. Strength 5/5 in all 4.  Psychiatric: Normal judgment and insight. Alert and oriented x 3. Normal mood.     Labs on Admission: I have personally reviewed following labs and imaging studies  CBC: Recent Labs  Lab 11/21/17 2049  HGB 12.6  HCT [id number]   Basic Metabolic Panel: Recent Labs  Lab 11/21/17 2049  NA 140  K 3.6  CL 99*  GLUCOSE 231*  BUN 4*  CREATININE 0.70   GFR: Estimated Creatinine Clearance: 85.3 mL/min (by C-G formula based on SCr of 0.7 mg/dL). Liver Function Tests: No results for input(s): AST, ALT, ALKPHOS, BILITOT, PROT, ALBUMIN in the last 168 hours. No results for input(s): LIPASE, AMYLASE in the last 168 hours. No results for input(s): AMMONIA in the last 168 hours. Coagulation Profile: No results for input(s): INR, PROTIME in the last 168 hours. Cardiac Enzymes: No results for input(s): CKTOTAL, CKMB, CKMBINDEX, TROPONINI in the last 168 hours. BNP (last 3 results) No results for input(s): PROBNP in the last 8760 hours. HbA1C: No results for input(s): HGBA1C in the last 72 hours. CBG: No results for input(s): GLUCAP in the last 168 hours. Lipid Profile: No results for input(s): CHOL, HDL, LDLCALC, TRIG, CHOLHDL, LDLDIRECT in the last 72 hours. Thyroid Function Tests: No results for input(s): TSH, T4TOTAL, FREET4, T3FREE, THYROIDAB in the last 72 hours. Anemia Panel: No results for input(s): VITAMINB12, FOLATE, FERRITIN, TIBC, IRON, RETICCTPCT in the last 72 hours. Urine analysis:    Component Value Date/Time   COLORURINE AMBER BIOCHEMICALS MAY BE AFFECTED BY COLOR (A) 10/01/2009 2028   APPEARANCEUR CLEAR 10/01/2009 2028   LABSPEC 1.030 10/01/2009 2028   PHURINE 5.5 10/01/2009  2028   GLUCOSEU NEGATIVE 10/01/2009 2028   HGBUR NEGATIVE 10/01/2009 2028   BILIRUBINUR NEGATIVE 10/01/2009 2028   KETONESUR 15 (A) 10/01/2009 2028   PROTEINUR 100 (A) 10/01/2009 2028   UROBILINOGEN 1.0 10/01/2009 2028   NITRITE NEGATIVE  10/01/2009 2028   LEUKOCYTESUR NEGATIVE 10/01/2009 2028   Sepsis Labs: No results found for this or any previous visit (from the past 240 hour(s)).   Radiological Exams on Admission: Ct Maxillofacial W Contrast  Result Date: 11/21/2017 CLINICAL DATA:  62 year old female with pain following dental extractions 3 weeks ago. Sinus pain, pressure, headache. EXAM: CT MAXILLOFACIAL WITH CONTRAST TECHNIQUE: Multidetector CT imaging of the maxillofacial structures was performed with intravenous contrast. Multiplanar CT image reconstructions were also generated. CONTRAST:  41m ISOVUE-300 IOPAMIDOL (ISOVUE-300) INJECTION 61% COMPARISON:  Neck CT with contrast 04/24/2013. FINDINGS: Osseous: Multiple prior dental extractions. Recent appearing extractions at the right maxillary molar and bicuspid dentition, left maxillary molar dentition. Bilateral anterior molar extraction sites appear to communicate with the maxillary sinuses, more so on the right (sagittal image 7). Furthermore, there is a sclerotic tooth or bone fragment within the left maxillary sinus measuring 6 millimeters which was not present in 2014. See additional sinus findings below. Intact mandible. Zygoma and other facial bones are intact. Intact central skull base and visible cervical spine. Orbits: Intact orbital walls. Visualized orbit soft tissues are within normal limits. Sinuses: Abnormal floor of the left maxillary sinus as stated above, including a 6 millimeter bone or tooth fragment as seen on series 3, image 46. Subtotal opacification of the left maxillary sinus with a fluid level (series 2, image 34). Associated widespread left ethmoid mucosal thickening and opacification. Mild involvement of the left  frontoethmoidal recess. The sinuses were clear in 2014. The right maxillary sinus has a more normal appearance, there is mild right alveolar recess mucosal thickening. The frontal sinuses and sphenoid sinuses remain well pneumatized. Bilateral tympanic cavities and mastoids are clear. Soft tissues: Negative visible larynx, pharynx, parapharyngeal spaces, retropharyngeal space, sublingual space, submandibular spaces, parotid spaces, and masticator spaces. Major vascular structures in the neck and at the skull base are patent. No cervical lymphadenopathy. Limited intracranial: Negative visualized brain parenchyma. IMPRESSION: 1. Evidence of multiple recent maxillary tooth extractions. The anterior left maxillary molar extraction site defect communicates with the left maxillary sinus, there is a 6 mm tooth fragment within the floor of that sinus (series 7, image 58), and there is associated acute left maxillary and ethmoid paranasal sinusitis. 2. Mild mucosal thickening in the floor of the right maxillary sinus. Electronically Signed   By: HGenevie AnnM.D.   On: 11/21/2017 21:33    Maxillofacial CT scan: Independently reviewed.  Showing inflammation of the left maxillary sinus and signs of piece of bone floating present within the maxillary sinus.  Assessment/Plan Sinusitis: Acute.  Patient presents with fever up to 100.9, facial swelling and pain after recent dental extraction.  Patient previously treated with Augmentin without relief of symptoms.  CT scan reveals signs of acute information of the left maxillary and ethmoid sinuses.  Patient was given 900 mg of clindamycin IV initially. - Admit to MedSurg bed - Continue empiric antibiotics of clindamycin - Will follow-up CBC and CMP - Oxycodone as needed for pain control  - Tylenol as needed for fever  History of tooth extraction: Acute.  Patient presents with signs of displaced fragment of tooth fragment present as seen on CT imaging.  ENT was consulted  initially, but recommended oral surgery consult - Will need to consult oral surgery in a.m.  Diabetes mellitus type 2 with gastroparesis: On admission initial blood sugar was noted to be in the 200s. - Hypoglycemic protocol - Hold metformin and Victoza  - Continue Reglan for gastroparesis -  Continue home regimen of insulin 70/30 mix - CBGs q. before meals with sensitive SSI  COPD with chronic bronchitis: Stable. - DuoNeb as needed for shortness of breath/wheezing   Essential hypertension - Continue losartan   Anxiety - Continue Celexa and Ativan prn anxiety  OSA on CPAP - CPAP per respiratory therapy  DVT prophylaxis: lovenox Code Status: Full Family Communication: Discussed plan of care with the patient and family present at bedside Disposition Plan: TBD  Consults called: ENT  Admission status: Observation  Norval Morton MD [facility] Pager [phone]   If 7PM-7AM, please contact night-coverage [email or web address] Password [facility]  11/21/2017, 10:19 PM

## 2017-11-22 DIAGNOSIS — Z794 Long term (current) use of insulin: Secondary | ICD-10-CM

## 2017-11-22 DIAGNOSIS — G4733 Obstructive sleep apnea (adult) (pediatric): Secondary | ICD-10-CM

## 2017-11-22 DIAGNOSIS — J018 Other acute sinusitis: Principal | ICD-10-CM

## 2017-11-22 DIAGNOSIS — E119 Type 2 diabetes mellitus without complications: Secondary | ICD-10-CM

## 2017-11-22 DIAGNOSIS — I10 Essential (primary) hypertension: Secondary | ICD-10-CM

## 2017-11-22 DIAGNOSIS — K08409 Partial loss of teeth, unspecified cause, unspecified class: Secondary | ICD-10-CM | POA: Diagnosis present

## 2017-11-22 LAB — GLUCOSE, CAPILLARY
Glucose-Capillary: 83 mg/dL (ref 65–99)
Glucose-Capillary: 101 mg/dL — ABNORMAL HIGH (ref 65–99)
Glucose-Capillary: 92 mg/dL (ref 65–99)
Glucose-Capillary: 117 mg/dL — ABNORMAL HIGH (ref 65–99)

## 2017-11-22 LAB — COMPREHENSIVE METABOLIC PANEL
ALT: 14 U/L (ref 14–54)
AST: 14 U/L — ABNORMAL LOW (ref 15–41)
Albumin: 3.5 g/dL (ref 3.5–5.0)
Alkaline Phosphatase: 74 U/L (ref 38–126)
Anion gap: 8 (ref 5–15)
BUN: 5 mg/dL — ABNORMAL LOW (ref 6–20)
CO2: 26 mmol/L (ref 22–32)
Calcium: 9 mg/dL (ref 8.9–10.3)
Chloride: 105 mmol/L (ref 101–111)
Creatinine, Ser: 0.63 mg/dL (ref 0.44–1.00)
GFR calc Af Amer: >60 mL/min (ref >60)
GFR calc non Af Amer: >60 mL/min (ref >60)
Glucose, Bld: 81 mg/dL (ref 65–99)
Potassium: 3.4 mmol/L — ABNORMAL LOW (ref 3.5–5.1)
Sodium: 139 mmol/L (ref 135–145)
Total Bilirubin: 0.3 mg/dL (ref 0.3–1.2)
Total Protein: 7.3 g/dL (ref 6.5–8.1)

## 2017-11-22 LAB — LACTIC ACID, PLASMA: Lactic Acid, Venous: 0.9 mmol/L (ref 0.5–1.9)

## 2017-11-22 LAB — CBC WITH DIFFERENTIAL/PLATELET
Basophils Absolute: 0 10*3/uL (ref 0.0–0.1)
Basophils Relative: 1 %
Eosinophils Absolute: 0.1 10*3/uL (ref 0.0–0.7)
Eosinophils Relative: 3 %
HCT: 36.4 % (ref 36.0–46.0)
Hemoglobin: 11.4 g/dL — ABNORMAL LOW (ref 12.0–15.0)
Lymphocytes Relative: 24 %
Lymphs Abs: 1 10*3/uL (ref 0.7–4.0)
MCH: 26.9 pg (ref 26.0–34.0)
MCHC: 31.3 g/dL (ref 30.0–36.0)
MCV: 85.8 fL (ref 78.0–100.0)
Monocytes Absolute: 0.5 10*3/uL (ref 0.1–1.0)
Monocytes Relative: 11 %
Neutro Abs: 2.7 10*3/uL (ref 1.7–7.7)
Neutrophils Relative %: 61 %
Platelets: 236 10*3/uL (ref 150–400)
RBC: 4.24 MIL/uL (ref 3.87–5.11)
RDW: 14.2 % (ref 11.5–15.5)
WBC: 4.4 10*3/uL (ref 4.0–10.5)

## 2017-11-22 LAB — HIV ANTIBODY (ROUTINE TESTING W REFLEX): HIV Screen 4th Generation wRfx: NONREACTIVE

## 2017-11-22 LAB — PHOSPHORUS: Phosphorus: 2.6 mg/dL (ref 2.5–4.6)

## 2017-11-22 LAB — CBG MONITORING, ED: Glucose-Capillary: 139 mg/dL — ABNORMAL HIGH (ref 65–99)

## 2017-11-22 LAB — MAGNESIUM: Magnesium: 1.8 mg/dL (ref 1.7–2.4)

## 2017-11-22 MED ORDER — FLUTICASONE PROPIONATE 50 MCG/ACT NA SUSP
2.0000 | Freq: Every day | NASAL | Status: DC
  Administered 2017-11-22 – 2017-11-23 (×2): 2 via NASAL
  Filled 2017-11-22: qty 16

## 2017-11-22 MED ORDER — INSULIN ASPART PROT & ASPART (70-30 MIX) 100 UNIT/ML PEN
15.0000 [IU] | PEN_INJECTOR | Freq: Two times a day (BID) | SUBCUTANEOUS | Status: DC
  Filled 2017-11-22: qty 3

## 2017-11-22 MED ORDER — POTASSIUM CHLORIDE CRYS ER 20 MEQ PO TBCR
40.0000 meq | EXTENDED_RELEASE_TABLET | Freq: Two times a day (BID) | ORAL | Status: AC
  Administered 2017-11-22 (×2): 40 meq via ORAL
  Filled 2017-11-22 (×2): qty 2

## 2017-11-22 MED ORDER — ZOLPIDEM TARTRATE 5 MG PO TABS
5.0000 mg | ORAL_TABLET | Freq: Every day | ORAL | Status: DC
  Administered 2017-11-22: 5 mg via ORAL
  Filled 2017-11-22: qty 1

## 2017-11-22 MED ORDER — INSULIN ASPART PROT & ASPART (70-30 MIX) 100 UNIT/ML ~~LOC~~ SUSP
15.0000 [IU] | Freq: Two times a day (BID) | SUBCUTANEOUS | Status: DC
  Administered 2017-11-22 – 2017-11-23 (×3): 15 [IU] via SUBCUTANEOUS
  Filled 2017-11-22: qty 10

## 2017-11-22 MED ORDER — AZELASTINE HCL 0.1 % NA SOLN
1.0000 | Freq: Every day | NASAL | Status: DC | PRN
  Filled 2017-11-22: qty 30

## 2017-11-22 MED ORDER — LORATADINE 10 MG PO TABS: 10.0000 mg | ORAL_TABLET | Freq: Every day | ORAL | Status: DC | PRN

## 2017-11-22 MED ORDER — LINACLOTIDE 145 MCG PO CAPS
290.0000 ug | ORAL_CAPSULE | Freq: Every day | ORAL | Status: DC
  Administered 2017-11-22 – 2017-11-23 (×2): 290 ug via ORAL
  Filled 2017-11-22 (×2): qty 2

## 2017-11-22 MED ORDER — CITALOPRAM HYDROBROMIDE 20 MG PO TABS
20.0000 mg | ORAL_TABLET | Freq: Every evening | ORAL | Status: DC
  Administered 2017-11-22: 20 mg via ORAL
  Filled 2017-11-22: qty 1

## 2017-11-22 NOTE — Progress Notes (Signed)
Patient arrived to the floor at approximately 0120. She is alert and verbally responsive and has voiced no complaints at this time.

## 2017-11-22 NOTE — Progress Notes (Signed)
Spoke with pt concerning being able to purchase her medications. Pt have insurance, however her co-pay in high for some medications, insulin and Linzess. Upon searching for discounts, coupon and medication assistant programs for her was unsuccessful, related to pt having a Medicare product. Pt would benefit with medications that are at a lesser cost. At present time CM is unable to assistant pt with her medications.

## 2017-11-22 NOTE — Progress Notes (Addendum)
PROGRESS NOTE    [name]  ZOX:[id number] DOB: 07/01/1956 DOA: 11/21/2017 PCP: Georgann Housekeeper, MD   Brief Narrative:  [name] is a 62 y.o. female with medical history significant of HTN, HLD,COPD, DM type II with gastroparesis, vocal cord dysfunction, and GERD and other comorbids who presents with complaints of left-sided tooth pain. Patient had dental extraction of 5 teeth approximately 3 weeks ago at the [facility]. She reports that it is a same day clinic where teeth are extracted and partials are given.  She was given a week of Augmentin and pain medication of Ultram following the procedure.  Patient took Augmentin as prescribed. However, since that time has continued swelling and pain of the left side of face with associated intermittent fever, chills, sinus pressure, and pain.  Tried doubling up on her tramadol medication without relief.  She has had decreased p.o. intake due to pain symptoms with chewing.  Denies any loss of consciousness, difficulty swallowing, chest pain, nausea, or vomiting symptoms. Patient contacted her PCP regarding the issue but came here, but came to the emergency department when symptoms progressively worsened.  She notes having similar symptoms after sinus surgery years ago.  Upon admission into the emergency department patient was noted to be febrile up to 100.9. Maxillofacial CT scan showed recent tooth extractions with signs of acute left maxillary and ethmoid paranasal sinusitis along with a retained tooth fragment.  Patient was given clindamycin IV and ENT was consulted who recommended Oral Surgery consultation. She is admitted for Acute Sinusitis placed on IV clindamycin.  Assessment & Plan:   Principal Problem:   Sinusitis Active Problems:   Diabetes mellitus type 2, insulin dependent (HCC)   Essential hypertension   Obstructive sleep apnea   H/O tooth extraction  Acute Maxillary and Ethmoid Sinusitis -Acute. Patient presents  with fever up to 100.9, facial swelling and pain after recent dental extraction.   -Patient previously treated with Augmentin without relief of symptoms. -CT scan reveals signs of acute inflammation of the left maxillary and ethmoid sinuses.   -Patient was given 900 mg of clindamycin IV initially. -Admitted to MedSurg bed -Blood cultures x2 pending  -Continue empiric antibiotics of clindamycin -Continue with Azelastine 1 spray each nares daily as needed for congestion and Start Fluticasone 2 spray Each Nare Daily  -Continue with Acetaminophen 650 mg p.o./RC every 6 hours as needed for mild pain or fever greater than or equal to 101 -Continue IV fluid Hydration at 100 mL's per hour -C/w Oxycodone IR 5 mg p.o. every 6 hours as needed moderate pain -Continue with Zofran 4 mill grams p.o./IV every 6 hours as needed for nausea and vomiting  History of Recent Dental Extractions  -Acute.  Patient presents with signs of displaced fragment of tooth fragment present as seen on CT imaging.   -ENT was consulted initially, but recommended oral surgery consult -Discussed case with Oral Surgery Dr. Barbette Merino who recommends having the patient follow-up in clinic after discharge.  Dr. Barbette Merino is aware of the patient and have given Dr. Randa Evens Clinic Number to the the patient on the AVS  COPD with Chronic Bronchitis and Asthma -Stable. -DuoNeb as needed for shortness of breath/wheezing   Hypokalemia -Patient potassium level this morning was 3.4 -Replete with p.o. potassium chloride 40 mEq twice daily x2 doses -Continue to monitor and replete as necessary -Repeat CMP in AM.  HLD -Continue with Atorvastatin 20 mg p.o. nightly  Essential HTN -Continue with Losartan 100 mg  p.o. daily  GERD -Continue with Pantoprazole 40 mg p.o. daily  Anxiety and Depression -Continue with Alprazolam 0.5 mg p.o. twice daily as needed for anxiety -Continue with Citalopram 20 mg p.o. nightly  OSA on CPAP -C/w CPAP per  RT  Diabetes Mellitus Type 2 associated with Gastroparesis and Neuropathy -Carb modified Diet -Continue with Metoclopramide 5 mg p.o. 3 times daily before meals -Continue with Novolog 70/30 15 units subcu twice daily with meals -Also added Sensitive NovoLog sliding scale insulin before meals -Continue with Gabapentin 300 mg p.o. 3 times daily for neuropathy -CBGs ranging from 83-139  Allergies/Allergic Rhinitis -C/w Loratadine 20 mg po Daily PRN Allergies   Normocytic Anemia -Patient's Hb/Hct went from 12.6/37.0 -> 11.4/36.4 -Likely dilutional drop from IV fluid resuscitation -Continue to monitor for signs and symptoms of bleeding -Repeat CBC in a.m.  DVT prophylaxis: Enoxaparin 40 mg subcu nightly Code Status: FULL CODE Family Communication: No family present at bedside  Disposition Plan: Anticipate D/C in next 24 hours  Consultants:   ENT  Discussed Case with Oral Surgery Dr. Barbette Merino   Procedures: None   Antimicrobials:  Anti-infectives (From admission, onward)   Start     Dose/Rate Route Frequency Ordered Stop   11/21/17 2200  clindamycin (CLEOCIN) IVPB 900 mg     900 mg 100 mL/hr over 30 Minutes Intravenous  Once 11/21/17 2158 11/21/17 2308   11/21/17 0600  clindamycin (CLEOCIN) IVPB 600 mg     600 mg 100 mL/hr over 30 Minutes Intravenous Every 8 hours 11/21/17 2327       Subjective: Seen and examined states that she is feeling better than yesterday.  No nausea, vomiting, shortness breath, headache blurred vision or double vision.  States that she feels better when sleeping with her CPAP.  States pain is been improved however still has some discomfort.  No other complaints or concerns at this time.  Objective: Vitals:   11/21/17 2130 11/21/17 2356 11/22/17 0124 11/22/17 0519  BP: 131/63 115/73 130/71 122/70  Pulse: 72 62 (!) 59 (!) 52  Resp: Temp:   98.1 F (36.7 C) 97.7 F (36.5 C)  TempSrc:   Oral Oral  SpO2: 98% 95% 97% 100%  Weight:        Height:        Intake/Output Summary (Last 24 hours) at 11/22/2017 1306 Last data filed at 11/22/2017 0536 Gross per 24 hour  Intake 917.4 ml  Output -  Net 917.4 ml   Filed Weights   11/21/17 1921  Weight: 96.2 kg (212 lb)   Examination: Physical Exam:  Constitutional: WN/WD obese AAF in NAD and appears calm and comfortable Eyes: Lids and conjunctivae normal, sclerae anicteric  ENMT: External Ears, Nose appear normal. Grossly normal hearing. Mucous membranes are moist. Neck: Appears normal, supple, no cervical masses, normal ROM, no appreciable thyromegaly, no JVD Respiratory: Diminished to auscultation bilaterally, no wheezing, rales, rhonchi or crackles. Normal respiratory effort and patient is not tachypenic. No accessory muscle use.  Cardiovascular: RRR, no murmurs / rubs / gallops. S1 and S2 auscultated. No extremity edema.   Abdomen: Soft, non-tender, Distended 2/2 body habitus. No masses palpated. No appreciable hepatosplenomegaly. Bowel sounds positive x4.  GU: Deferred. Musculoskeletal: No clubbing / cyanosis of digits/nails. No joint deformity upper and lower extremities.  Skin: No rashes, lesions, ulcers on a limited skin eval. No induration; Warm and dry.  Neurologic: CN 2-12 grossly intact with no focal deficits. Romberg sign and cerebellar reflexes  not assessed.  Psychiatric: Normal judgment and insight. Alert and oriented x 3. Normal mood and appropriate affect.   Data Reviewed: I have personally reviewed following labs and imaging studies  CBC: Recent Labs  Lab 11/21/17 2049 11/21/17 2230 11/22/17 1203  WBC  --  6.9 4.4  NEUTROABS  --  5.1 2.7  HGB 12.6 11.5* 11.4*  HCT 37.0 36.9 36.4  MCV  --  85.2 85.8  PLT  --  265 236   Basic Metabolic Panel: Recent Labs  Lab 11/21/17 2049 11/21/17 2230 11/22/17 1203  NA 140 139 139  K 3.6 3.7 3.4*  CL 99* 103 105  CO2  --  26 26  GLUCOSE 231* 182* 81  BUN 4* 6 5*  CREATININE 0.70 0.72 0.63  CALCIUM  --   9.4 9.0  MG  --   --  1.8  PHOS  --   --  2.6   GFR: Estimated Creatinine Clearance: 85.3 mL/min (by C-G formula based on SCr of 0.63 mg/dL). Liver Function Tests: Recent Labs  Lab 11/22/17 1203  AST 14*  ALT 14  ALKPHOS 74  BILITOT 0.3  PROT 7.3  ALBUMIN 3.5   No results for input(s): LIPASE, AMYLASE in the last 168 hours. No results for input(s): AMMONIA in the last 168 hours. Coagulation Profile: No results for input(s): INR, PROTIME in the last 168 hours. Cardiac Enzymes: No results for input(s): CKTOTAL, CKMB, CKMBINDEX, TROPONINI in the last 168 hours. BNP (last 3 results) No results for input(s): PROBNP in the last 8760 hours. HbA1C: No results for input(s): HGBA1C in the last 72 hours. CBG: Recent Labs  Lab 11/22/17 0000 11/22/17 0752 11/22/17 1136  GLUCAP 139* 83 101*   Lipid Profile: No results for input(s): CHOL, HDL, LDLCALC, TRIG, CHOLHDL, LDLDIRECT in the last 72 hours. Thyroid Function Tests: No results for input(s): TSH, T4TOTAL, FREET4, T3FREE, THYROIDAB in the last 72 hours. Anemia Panel: No results for input(s): VITAMINB12, FOLATE, FERRITIN, TIBC, IRON, RETICCTPCT in the last 72 hours. Sepsis Labs: Recent Labs  Lab 11/21/17 2230 11/22/17 0549  LATICACIDVEN 1.3 0.9    No results found for this or any previous visit (from the past 240 hour(s)).   Radiology Studies: Ct Maxillofacial W Contrast  Result Date: 11/21/2017 CLINICAL DATA:  62 year old female with pain following dental extractions 3 weeks ago. Sinus pain, pressure, headache. EXAM: CT MAXILLOFACIAL WITH CONTRAST TECHNIQUE: Multidetector CT imaging of the maxillofacial structures was performed with intravenous contrast. Multiplanar CT image reconstructions were also generated. CONTRAST:  75mL ISOVUE-300 IOPAMIDOL (ISOVUE-300) INJECTION 61% COMPARISON:  Neck CT with contrast 04/24/2013. FINDINGS: Osseous: Multiple prior dental extractions. Recent appearing extractions at the right maxillary  molar and bicuspid dentition, left maxillary molar dentition. Bilateral anterior molar extraction sites appear to communicate with the maxillary sinuses, more so on the right (sagittal image 7). Furthermore, there is a sclerotic tooth or bone fragment within the left maxillary sinus measuring 6 millimeters which was not present in 2014. See additional sinus findings below. Intact mandible. Zygoma and other facial bones are intact. Intact central skull base and visible cervical spine. Orbits: Intact orbital walls. Visualized orbit soft tissues are within normal limits. Sinuses: Abnormal floor of the left maxillary sinus as stated above, including a 6 millimeter bone or tooth fragment as seen on series 3, image 46. Subtotal opacification of the left maxillary sinus with a fluid level (series 2, image 34). Associated widespread left ethmoid mucosal thickening and opacification. Mild involvement of the  left frontoethmoidal recess. The sinuses were clear in 2014. The right maxillary sinus has a more normal appearance, there is mild right alveolar recess mucosal thickening. The frontal sinuses and sphenoid sinuses remain well pneumatized. Bilateral tympanic cavities and mastoids are clear. Soft tissues: Negative visible larynx, pharynx, parapharyngeal spaces, retropharyngeal space, sublingual space, submandibular spaces, parotid spaces, and masticator spaces. Major vascular structures in the neck and at the skull base are patent. No cervical lymphadenopathy. Limited intracranial: Negative visualized brain parenchyma. IMPRESSION: 1. Evidence of multiple recent maxillary tooth extractions. The anterior left maxillary molar extraction site defect communicates with the left maxillary sinus, there is a 6 mm tooth fragment within the floor of that sinus (series 7, image 58), and there is associated acute left maxillary and ethmoid paranasal sinusitis. 2. Mild mucosal thickening in the floor of the right maxillary sinus.  Electronically Signed   By: Odessa Fleming M.D.   On: 11/21/2017 21:33   Scheduled Meds: . aspirin EC  81 mg Oral BID  . atorvastatin  20 mg Oral QPM  . citalopram  20 mg Oral QPM  . enoxaparin (LOVENOX) injection  40 mg Subcutaneous QHS  . gabapentin  300 mg Oral TID  . insulin aspart  0-9 Units Subcutaneous TID WC  . insulin aspart protamine- aspart  15 Units Subcutaneous BID WC  . linaclotide  290 mcg Oral QAC breakfast  . losartan  100 mg Oral Daily  . metoCLOPramide  5 mg Oral TID AC  . pantoprazole  40 mg Oral Daily  . zolpidem  5 mg Oral QHS   Continuous Infusions: . sodium chloride 100 mL/hr at 11/22/17 0900  . clindamycin (CLEOCIN) IV Stopped (11/22/17 1478)    LOS: 1 day   Merlene Laughter, DO [facility] Pager [phone]  If 7PM-7AM, please contact night-coverage [email or web address] Password [email or web address] 11/22/2017, 1:06 PM

## 2017-11-22 NOTE — Progress Notes (Signed)
CSW consult-Medication needs.   CSW informed the Case Manager.    Vivi Barrack, Theresia Majors, MSW Clinical Social Worker  [phone] 11/22/2017  12:32 PM

## 2017-11-22 NOTE — ED Notes (Addendum)
Attempted to call report.  Charge RN re-evaluating room assignment.

## 2017-11-22 NOTE — ED Notes (Signed)
ED TO INPATIENT HANDOFF REPORT  Name/Age/Gender [name] 62 y.o. female  Code Status    Code Status Orders  (From admission, onward)        Start     Ordered   11/21/17 2255  Full code  Continuous     11/21/17 2327    Code Status History    This patient has a current code status but no historical code status.      Home/SNF/Other Home  Chief Complaint Issue with Sinuses   Level of Care/Admitting Diagnosis ED Disposition    ED Disposition Condition [facility] Area: [facility] [[id number]]  Level of Care: Med-Surg [16]  Diagnosis: Sinusitis [161096]  Admitting Physician: Norval Morton [[id number]]  Attending Physician: Norval Morton [[id number]]  Estimated length of stay: past midnight tomorrow  Certification:: I certify this patient will need inpatient services for at least 2 midnights  PT Class (Do Not Modify): Inpatient [101]  PT Acc Code (Do Not Modify): Private [1]       Medical History Past Medical History:  Diagnosis Date  . Anxiety   . Arthritis    "both shoulders"  . Asthma   . Blood transfusion without reported diagnosis   . Chronic bronchitis ([place])   . Cold extremity without peripheral vascular disease    bilaterally; "from my knees down into my feet; they get ice cold"  . COPD (chronic obstructive pulmonary disease) ([place])   . Environmental allergies    "year round"  . Exertional dyspnea   . Gastroparesis   . GERD (gastroesophageal reflux disease)   . Headache(784.0)   . Heart murmur   . Hiatal hernia   . High cholesterol   . HTN (hypertension)   . Obesity   . Rotator cuff tear   . Sciatic nerve pain    "tailbone down into my legs when it flares up"  . Sciatica   . Sleep apnea    CPAP, sleep study on [place]  . Type II diabetes mellitus ([place])   . Vocal cord dysfunction    "I've had it for many years"    Allergies Allergies  Allergen Reactions  . Lisinopril Cough    IV  Location/Drains/Wounds Patient Lines/Drains/Airways Status   Active Line/Drains/Airways    Name:   Placement date:   Placement time:   Site:   Days:   Peripheral IV 11/21/17 Right Antecubital   11/21/17    2104    Antecubital   1   Peripheral IV 11/21/17 Left Antecubital   11/21/17    2103    Antecubital   1   Peripheral IV 11/21/17 Right Antecubital   11/21/17    2236    Antecubital   1   Incision 01/28/12 Shoulder Left   01/28/12    1223     2125          Labs/Imaging Results for orders placed or performed during the hospital encounter of 11/21/17 (from the past 48 hour(s))  I-Stat Chem 8, ED     Status: Abnormal   Collection Time: 11/21/17  8:49 PM  Result Value Ref Range   Sodium 140 135 - 145 mmol/L   Potassium 3.6 3.5 - 5.1 mmol/L   Chloride 99 (L) 101 - 111 mmol/L   BUN 4 (L) 6 - 20 mg/dL   Creatinine, Ser 0.70 0.44 - 1.00 mg/dL   Glucose, Bld 231 (H) 65 - 99 mg/dL   Calcium, Ion  1.22 1.15 - 1.40 mmol/L   TCO2 29 22 - 32 mmol/L   Hemoglobin 12.6 12.0 - 15.0 g/dL   HCT 37.0 36.0 - 46.0 %  CBC with Differential/Platelet     Status: Abnormal   Collection Time: 11/21/17 10:30 PM  Result Value Ref Range   WBC 6.9 4.0 - 10.5 K/uL   RBC 4.33 3.87 - 5.11 MIL/uL   Hemoglobin 11.5 (L) 12.0 - 15.0 g/dL   HCT 36.9 36.0 - 46.0 %   MCV 85.2 78.0 - 100.0 fL   MCH 26.6 26.0 - 34.0 pg   MCHC 31.2 30.0 - 36.0 g/dL   RDW 13.9 11.5 - 15.5 %   Platelets 265 150 - 400 K/uL   Neutrophils Relative % 75 %   Neutro Abs 5.1 1.7 - 7.7 K/uL   Lymphocytes Relative 16 %   Lymphs Abs 1.1 0.7 - 4.0 K/uL   Monocytes Relative 7 %   Monocytes Absolute 0.5 0.1 - 1.0 K/uL   Eosinophils Relative 2 %   Eosinophils Absolute 0.1 0.0 - 0.7 K/uL   Basophils Relative 0 %   Basophils Absolute 0.0 0.0 - 0.1 K/uL    Comment: Performed at [facility], [place] [address], [place], [place] [address]  Basic metabolic panel     Status: Abnormal   Collection Time: 11/21/17 10:30 PM  Result  Value Ref Range   Sodium 139 135 - 145 mmol/L   Potassium 3.7 3.5 - 5.1 mmol/L   Chloride 103 101 - 111 mmol/L   CO2 26 22 - 32 mmol/L   Glucose, Bld 182 (H) 65 - 99 mg/dL   BUN 6 6 - 20 mg/dL   Creatinine, Ser 0.72 0.44 - 1.00 mg/dL   Calcium 9.4 8.9 - 10.3 mg/dL   GFR calc non Af Amer >60 >60 mL/min   GFR calc Af Amer >60 >60 mL/min    Comment: (NOTE) The eGFR has been calculated using the CKD EPI equation. This calculation has not been validated in all clinical situations. eGFR's persistently <60 mL/min signify possible Chronic Kidney Disease.    Anion gap 10 5 - 15    Comment: Performed at [facility], [place] [address], [place], [place] [address]  Lactic acid, plasma     Status: None   Collection Time: 11/21/17 10:30 PM  Result Value Ref Range   Lactic Acid, Venous 1.3 0.5 - 1.9 mmol/L    Comment: Performed at [facility], [place] [address] [place], [place] [address]  CBG monitoring, ED     Status: Abnormal   Collection Time: 11/22/17 12:00 AM  Result Value Ref Range   Glucose-Capillary 139 (H) 65 - 99 mg/dL   Ct Maxillofacial W Contrast  Result Date: 11/21/2017 CLINICAL DATA:  62 year old female with pain following dental extractions 3 weeks ago. Sinus pain, pressure, headache. EXAM: CT MAXILLOFACIAL WITH CONTRAST TECHNIQUE: Multidetector CT imaging of the maxillofacial structures was performed with intravenous contrast. Multiplanar CT image reconstructions were also generated. CONTRAST:  56m ISOVUE-300 IOPAMIDOL (ISOVUE-300) INJECTION 61% COMPARISON:  Neck CT with contrast 04/24/2013. FINDINGS: Osseous: Multiple prior dental extractions. Recent appearing extractions at the right maxillary molar and bicuspid dentition, left maxillary molar dentition. Bilateral anterior molar extraction sites appear to communicate with the maxillary sinuses, more so on the right (sagittal image 7). Furthermore, there is a sclerotic tooth or bone fragment within  the left maxillary sinus measuring 6 millimeters which was not present in 2014. See additional  sinus findings below. Intact mandible. Zygoma and other facial bones are intact. Intact central skull base and visible cervical spine. Orbits: Intact orbital walls. Visualized orbit soft tissues are within normal limits. Sinuses: Abnormal floor of the left maxillary sinus as stated above, including a 6 millimeter bone or tooth fragment as seen on series 3, image 46. Subtotal opacification of the left maxillary sinus with a fluid level (series 2, image 34). Associated widespread left ethmoid mucosal thickening and opacification. Mild involvement of the left frontoethmoidal recess. The sinuses were clear in 2014. The right maxillary sinus has a more normal appearance, there is mild right alveolar recess mucosal thickening. The frontal sinuses and sphenoid sinuses remain well pneumatized. Bilateral tympanic cavities and mastoids are clear. Soft tissues: Negative visible larynx, pharynx, parapharyngeal spaces, retropharyngeal space, sublingual space, submandibular spaces, parotid spaces, and masticator spaces. Major vascular structures in the neck and at the skull base are patent. No cervical lymphadenopathy. Limited intracranial: Negative visualized brain parenchyma. IMPRESSION: 1. Evidence of multiple recent maxillary tooth extractions. The anterior left maxillary molar extraction site defect communicates with the left maxillary sinus, there is a 6 mm tooth fragment within the floor of that sinus (series 7, image 58), and there is associated acute left maxillary and ethmoid paranasal sinusitis. 2. Mild mucosal thickening in the floor of the right maxillary sinus. Electronically Signed   By: Genevie Ann M.D.   On: 11/21/2017 21:33    Pending Labs Unresulted Labs (From admission, onward)   Start     Ordered   11/22/17 0500  HIV antibody (Routine Testing)  Tomorrow morning,   R     11/21/17 2327   11/21/17 2157  Lactic  acid, plasma  Now then every 2 hours,   STAT     11/21/17 2158   11/21/17 2157  Blood culture (routine x 2)  BLOOD CULTURE X 2,   STAT     11/21/17 2158      Vitals/Pain Today's Vitals   11/21/17 2130 11/21/17 2136 11/21/17 2356 11/21/17 2356  BP: 131/63   115/73  Pulse: 72   62  Resp: 14   16  Temp:      TempSrc:      SpO2: 98%   95%  Weight:      Height:      PainSc:  8  7      Isolation Precautions No active isolations  Medications Medications  0.9 %  sodium chloride infusion ( Intravenous New Bag/Given 11/22/17 0010)  pantoprazole (PROTONIX) EC tablet 40 mg (has no administration in time range)  metoCLOPramide (REGLAN) tablet 5 mg (has no administration in time range)  gabapentin (NEURONTIN) capsule 300 mg (300 mg Oral Given 11/22/17 0010)  ALPRAZolam (XANAX) tablet 0.5 mg (has no administration in time range)  losartan (COZAAR) tablet 100 mg (has no administration in time range)  atorvastatin (LIPITOR) tablet 20 mg (20 mg Oral Given 11/22/17 0010)  aspirin EC tablet 81 mg (81 mg Oral Given 11/22/17 0010)  enoxaparin (LOVENOX) injection 40 mg (40 mg Subcutaneous Given 11/22/17 0010)  acetaminophen (TYLENOL) tablet 650 mg (has no administration in time range)    Or  acetaminophen (TYLENOL) suppository 650 mg (has no administration in time range)  oxyCODONE (Oxy IR/ROXICODONE) immediate release tablet 5 mg (5 mg Oral Given 11/22/17 0010)  ondansetron (ZOFRAN) tablet 4 mg (has no administration in time range)    Or  ondansetron (ZOFRAN) injection 4 mg (has no administration in time range)  clindamycin (  CLEOCIN) IVPB 600 mg (has no administration in time range)  insulin aspart (novoLOG) injection 0-9 Units (has no administration in time range)  insulin aspart (novoLOG) injection 0-5 Units (0 Units Subcutaneous Not Given 11/22/17 0001)  oxyCODONE-acetaminophen (PERCOCET/ROXICET) 5-325 MG per tablet 1 tablet (1 tablet Oral Given 11/21/17 2024)  iopamidol (ISOVUE-300) 61 %  injection 75 mL (75 mLs Intravenous Contrast Given 11/21/17 2102)  clindamycin (CLEOCIN) IVPB 900 mg (0 mg Intravenous Stopped 11/21/17 2308)    Mobility Ambulatory

## 2017-11-23 LAB — COMPREHENSIVE METABOLIC PANEL
ALT: 13 U/L — ABNORMAL LOW (ref 14–54)
AST: 12 U/L — ABNORMAL LOW (ref 15–41)
Albumin: 3 g/dL — ABNORMAL LOW (ref 3.5–5.0)
Alkaline Phosphatase: 62 U/L (ref 38–126)
Anion gap: 9 (ref 5–15)
BUN: 7 mg/dL (ref 6–20)
CO2: 26 mmol/L (ref 22–32)
Calcium: 9 mg/dL (ref 8.9–10.3)
Chloride: 107 mmol/L (ref 101–111)
Creatinine, Ser: 0.69 mg/dL (ref 0.44–1.00)
GFR calc Af Amer: >60 mL/min (ref >60)
GFR calc non Af Amer: >60 mL/min (ref >60)
Glucose, Bld: 92 mg/dL (ref 65–99)
Potassium: 4.4 mmol/L (ref 3.5–5.1)
Sodium: 142 mmol/L (ref 135–145)
Total Bilirubin: 0.4 mg/dL (ref 0.3–1.2)
Total Protein: 6.3 g/dL — ABNORMAL LOW (ref 6.5–8.1)

## 2017-11-23 LAB — CBC WITH DIFFERENTIAL/PLATELET
Basophils Absolute: 0 10*3/uL (ref 0.0–0.1)
Basophils Relative: 1 %
Eosinophils Absolute: 0.2 10*3/uL (ref 0.0–0.7)
Eosinophils Relative: 4 %
HCT: 34.7 % — ABNORMAL LOW (ref 36.0–46.0)
Hemoglobin: 10.8 g/dL — ABNORMAL LOW (ref 12.0–15.0)
Lymphocytes Relative: 23 %
Lymphs Abs: 1 10*3/uL (ref 0.7–4.0)
MCH: 26.8 pg (ref 26.0–34.0)
MCHC: 31.1 g/dL (ref 30.0–36.0)
MCV: 86.1 fL (ref 78.0–100.0)
Monocytes Absolute: 0.4 10*3/uL (ref 0.1–1.0)
Monocytes Relative: 10 %
Neutro Abs: 2.7 10*3/uL (ref 1.7–7.7)
Neutrophils Relative %: 62 %
Platelets: 245 10*3/uL (ref 150–400)
RBC: 4.03 MIL/uL (ref 3.87–5.11)
RDW: 14 % (ref 11.5–15.5)
WBC: 4.3 10*3/uL (ref 4.0–10.5)

## 2017-11-23 LAB — GLUCOSE, CAPILLARY: Glucose-Capillary: 86 mg/dL (ref 65–99)

## 2017-11-23 LAB — PHOSPHORUS: Phosphorus: 3.4 mg/dL (ref 2.5–4.6)

## 2017-11-23 LAB — MAGNESIUM: Magnesium: 1.8 mg/dL (ref 1.7–2.4)

## 2017-11-23 MED ORDER — OXYCODONE HCL 5 MG PO TABS: 5.0000 mg | ORAL_TABLET | Freq: Four times a day (QID) | ORAL | 0 refills | Status: DC | PRN

## 2017-11-23 MED ORDER — CLINDAMYCIN HCL 300 MG PO CAPS: 300.0000 mg | ORAL_CAPSULE | Freq: Three times a day (TID) | ORAL | 0 refills | Status: AC

## 2017-11-23 MED ORDER — CLINDAMYCIN HCL 300 MG PO CAPS: 300.0000 mg | ORAL_CAPSULE | Freq: Three times a day (TID) | ORAL | Status: DC

## 2017-11-23 MED ORDER — FLUTICASONE PROPIONATE 50 MCG/ACT NA SUSP: 2.0000 | Freq: Every day | NASAL | 2 refills | Status: AC

## 2017-11-23 NOTE — Discharge Summary (Signed)
Physician Discharge Summary  [name] [id number] DOB: 30-May-1956 DOA: 11/21/2017  PCP: Wenda Low, MD  Admit date: 11/21/2017 Discharge date: 11/23/2017  Admitted From: Home Disposition: Home  Recommendations for Outpatient Follow-up:  1. Follow up with PCP in 1-2 weeks 2. Follow up with Oral Surgery Dr. Hoyt Koch within 1 week 3. Follow up with ENT as needed 4. Finish Abx Course as prescribed. 5. Please obtain CMP/CBC, Mag, Phos in one week 6. Please follow up on the following pending results:  Home Health: No Equipment/Devices: None  Discharge Condition: Stable CODE STATUS: FULL CODE Diet recommendation: Heart Healthy Carb Modified Diet  Brief/Interim Summary: [name] a 62 y.o.femalewith medical history significant ofHTN, HLD,COPD, DM type II with gastroparesis, vocal cord dysfunction, and GERD and other comorbidswho presents with complaints of left-sided tooth pain. Patient had dental extraction of 5 teeth approximately 3 weeks ago at the [facility]. She reports that it is a same day clinic where teeth are extracted and partials are given. She was given a week of Augmentin and pain medication ofUltram following the procedure. Patient took Augmentin as prescribed. However, since that time has continued swelling and pain of the left side of face with associated intermittent fever, chills, sinus pressure, and pain. Tried doubling up on her tramadol medication without relief. She has had decreased p.o. intake due to pain symptoms with chewing. Denies any loss of consciousness, difficulty swallowing, chest pain, nausea, or vomiting symptoms. Patient contacted her PCP regarding the issue but came here, but came to the emergency department when symptoms progressively worsened. She notes having similar symptoms after sinus surgery years ago.  Upon admission into the emergency department patient was noted to be febrile up to 100.9. Maxillofacial CT  scan showed recent tooth extractions with signs of acute left maxillary and ethmoid paranasal sinusitis along with a retained tooth fragment.Patient was given clindamycin IV and ENT was consulted who recommended Oral Surgery consultation. She is admitted for Acute Sinusitis placed on IV clindamycin and improved and transitioned to po for completion of 7 day course. She deemed medically stable to be discharged home at this time and she will need to follow-up with oral surgery Dr. Hoyt Koch within 1 week to have tooth fragment removed from sinus and she will need to complete antibiotics.  If sinusitis still persists she will need to follow-up with ENT for further evaluation recommendations.  Discharge Diagnoses:  Principal Problem:   Sinusitis Active Problems:   Diabetes mellitus type 2, insulin dependent ([place])   Essential hypertension   Obstructive sleep apnea   H/O tooth extraction  Acute Maxillary and Ethmoid Sinusitis -Acute. Patient presented with fever up to 100.9, facial swelling and pain after recent dental extraction.  -Patient previously treated with Augmentin without relief of symptoms.  -CT scan reveals signs of acute inflammation of the left maxillary and ethmoid sinuses.  -Patient was given 900 mg of clindamycin IV initially continue IV 600 mg of clindamycin -Admitted to MedSurg bed -Blood cultures x2  no growth at 1 day  -Continue empiric antibiotics of clindamycin and transition to p.o. -Continue with Azelastine 1 spray each nares daily as needed for congestion and Start Fluticasone 2 spray Each Nare Daily  -Continue with Acetaminophen 650 mg p.o./RC every 6 hours as needed for mild pain or fever greater than or equal to 101 -Continue IV fluid Hydration at 100 mL's per hour while hospitalized -C/w Oxycodone IR 5 mg p.o. every 6 hours as needed moderate pain and  will discharge with 10 pills -Continue with Zofran 4 mg p.o./IV every 6 hours as needed for nausea and vomiting  hospitalized -Follow-up with PCP as well as oral surgery Dr. Hoyt Koch -Follow-up with his ENT as needed if sinusitis does not improve significantly after antibiotic course duration.  History of RecentDental Extractions  -Acute. Patient presents with signs of displaced fragment of toothfragmentpresent as seen on CT imaging.  -ENT was consulted initially, but recommended oral surgery consult -Discussed case with Oral Surgery Dr. Hoyt Koch who recommends having the patient follow-up in clinic after discharge.  Dr. Hoyt Koch is aware of the patient and have given Dr. Lupita Leash Clinic Number to the the patient on the AVS  COPD with Chronic Bronchitis and Asthma -Stable. -DuoNeb as needed for shortness of breath/wheezingwhile hospitalized -Follow-up with PCP  Hypokalemia -Patient potassium level this morning was 3.4 and improved to 4.4 -Replete with p.o. potassium chloride 40 mEq twice daily x2 doses yesterday -Continue to monitor and replete as necessary -Repeat CMP as an outpatient.  HLD -Continue with Atorvastatin 20 mg p.o. nightly  Essential HTN -Continue with Losartan 100 mg p.o. daily  GERD -Continue with Pantoprazole 40 mg p.o. daily  Anxiety and Depression -Continue with Alprazolam 0.5 mg p.o. twice daily as needed for anxiety -Continue with Citalopram 20 mg p.o. nightly  OSA on CPAP -C/w CPAP   Diabetes Mellitus Type 2 associated with Gastroparesis and Neuropathy -Carb modified Diet -Continue with Metoclopramide 5 mg p.o. 3 times daily before meals -Continue with Novolog 70/30 15 units subcu twice daily with meals while hospitalized -Also added Sensitive NovoLog sliding scale insulin before meals while hospitalized -Resume home insulin regimen at discharge -Continue with Gabapentin 300 mg p.o. 3 times daily for neuropathy -CBGs ranging from 86-117  Allergies/Allergic Rhinitis -C/w Loratadine 20 mg po Daily PRN Allergies   Normocytic Anemia -Patient's  Hb/Hct went from 12.6/37.0 -> 11.4/36.4 -> 10.8/34.7 -Likely dilutional drop from IV fluid resuscitation -Obtain anemia panel as an outpatient -Continue to monitor for signs and symptoms of bleeding -Repeat CBC as an outpatient  Discharge Instructions  Discharge Instructions    Call MD for:  difficulty breathing, headache or visual disturbances   Complete by:  As directed    Call MD for:  extreme fatigue   Complete by:  As directed    Call MD for:  hives   Complete by:  As directed    Call MD for:  persistant dizziness or light-headedness   Complete by:  As directed    Call MD for:  persistant nausea and vomiting   Complete by:  As directed    Call MD for:  redness, tenderness, or signs of infection (pain, swelling, redness, odor or green/yellow discharge around incision site)   Complete by:  As directed    Call MD for:  severe uncontrolled pain   Complete by:  As directed    Call MD for:  temperature >100.4   Complete by:  As directed    Diet - low sodium heart healthy   Complete by:  As directed    Discharge instructions   Complete by:  As directed    Follow up with PCP, Oral Surgery as an outpatient. Follow up with ENT if needed. Take all medications as prescribed. If symptoms change or worsen please return to the ED for evaluation.   Increase activity slowly   Complete by:  As directed      Allergies as of 11/23/2017      Reactions  Lisinopril Cough      Medication List    TAKE these medications   albuterol 108 (90 Base) MCG/ACT inhaler Commonly known as:  PROVENTIL HFA;VENTOLIN HFA Inhale 2 puffs into the lungs every 4 (four) hours as needed for wheezing or shortness of breath.   ALPRAZolam 0.5 MG tablet Commonly known as:  XANAX Take 0.5 mg by mouth 2 (two) times daily as needed for anxiety.   aspirin 81 MG tablet Take 81 mg by mouth 2 (two) times daily.   atorvastatin 20 MG tablet Commonly known as:  LIPITOR Take 20 mg by mouth every evening.    citalopram 10 MG tablet Commonly known as:  CELEXA Take 20 mg by mouth every evening.   clindamycin 300 MG capsule Commonly known as:  CLEOCIN Take 1 capsule (300 mg total) by mouth every 8 (eight) hours for 5 days.   DYMISTA 137-50 MCG/ACT Susp Generic drug:  Azelastine-Fluticasone Place 1 spray into the nose daily as needed (congestion).   fluticasone 50 MCG/ACT nasal spray Commonly known as:  FLONASE Place 2 sprays into both nostrils daily. Start taking on:  11/24/2017   gabapentin 300 MG capsule Commonly known as:  NEURONTIN Take 300 mg by mouth 3 (three) times daily.   linaclotide 290 MCG Caps capsule Commonly known as:  LINZESS Take 1 capsule (290 mcg total) by mouth daily before breakfast.   loratadine 10 MG tablet Commonly known as:  CLARITIN Take 10 mg by mouth daily as needed for allergies.   losartan 100 MG tablet Commonly known as:  COZAAR Take 100 mg by mouth daily.   metFORMIN 1000 MG tablet Commonly known as:  GLUCOPHAGE Take 1,000 mg by mouth 2 (two) times daily.   metoCLOPramide 5 MG tablet Commonly known as:  REGLAN Take 1 tablet by mouth before meals and at bedtime as needed What changed:    how much to take  how to take this  when to take this  reasons to take this  additional instructions   NOVOLOG MIX 70/30 FLEXPEN (70-30) 100 UNIT/ML FlexPen Generic drug:  insulin aspart protamine - aspart Inject 15 units subcutaneous twice daily   oxyCODONE 5 MG immediate release tablet Commonly known as:  Oxy IR/ROXICODONE Take 1 tablet (5 mg total) by mouth every 6 (six) hours as needed for severe pain (From Pain not controlled by Tramadol).   pantoprazole 40 MG tablet Commonly known as:  PROTONIX Take 1 tablet (40 mg total) by mouth 2 (two) times daily. What changed:  when to take this   traMADol 50 MG tablet Commonly known as:  ULTRAM Take 50 mg by mouth every 6 (six) hours as needed for pain.   VICTOZA 18 MG/3ML Sopn Generic drug:   liraglutide Inject 18 mg into the skin every morning.   zolpidem 5 MG tablet Commonly known as:  AMBIEN Take 5 mg by mouth at bedtime.      Follow-up Information    Wenda Low, MD. Call.   Specialty:  Internal Medicine Why:  Follow up within one week Contact information: [address], [address] [place] [address] [phone]        Diona Browner, [place]. Call.   Specialty:  Oral Surgery Why:  Follow up after Hospitalization  Contact information: [address] [place] [address] [phone]          Allergies  Allergen Reactions  . Lisinopril Cough    Consultations:  Discussed Case with Oral Surgery Dr. Hoyt Koch  ENT  was consulted on Admission but deferred  Procedures/Studies: Ct Maxillofacial W Contrast  Result Date: 11/21/2017 CLINICAL DATA:  62 year old female with pain following dental extractions 3 weeks ago. Sinus pain, pressure, headache. EXAM: CT MAXILLOFACIAL WITH CONTRAST TECHNIQUE: Multidetector CT imaging of the maxillofacial structures was performed with intravenous contrast. Multiplanar CT image reconstructions were also generated. CONTRAST:  96m ISOVUE-300 IOPAMIDOL (ISOVUE-300) INJECTION 61% COMPARISON:  Neck CT with contrast 04/24/2013. FINDINGS: Osseous: Multiple prior dental extractions. Recent appearing extractions at the right maxillary molar and bicuspid dentition, left maxillary molar dentition. Bilateral anterior molar extraction sites appear to communicate with the maxillary sinuses, more so on the right (sagittal image 7). Furthermore, there is a sclerotic tooth or bone fragment within the left maxillary sinus measuring 6 millimeters which was not present in 2014. See additional sinus findings below. Intact mandible. Zygoma and other facial bones are intact. Intact central skull base and visible cervical spine. Orbits: Intact orbital walls. Visualized orbit soft tissues are within normal limits. Sinuses: Abnormal floor of the  left maxillary sinus as stated above, including a 6 millimeter bone or tooth fragment as seen on series 3, image 46. Subtotal opacification of the left maxillary sinus with a fluid level (series 2, image 34). Associated widespread left ethmoid mucosal thickening and opacification. Mild involvement of the left frontoethmoidal recess. The sinuses were clear in 2014. The right maxillary sinus has a more normal appearance, there is mild right alveolar recess mucosal thickening. The frontal sinuses and sphenoid sinuses remain well pneumatized. Bilateral tympanic cavities and mastoids are clear. Soft tissues: Negative visible larynx, pharynx, parapharyngeal spaces, retropharyngeal space, sublingual space, submandibular spaces, parotid spaces, and masticator spaces. Major vascular structures in the neck and at the skull base are patent. No cervical lymphadenopathy. Limited intracranial: Negative visualized brain parenchyma. IMPRESSION: 1. Evidence of multiple recent maxillary tooth extractions. The anterior left maxillary molar extraction site defect communicates with the left maxillary sinus, there is a 6 mm tooth fragment within the floor of that sinus (series 7, image 58), and there is associated acute left maxillary and ethmoid paranasal sinusitis. 2. Mild mucosal thickening in the floor of the right maxillary sinus. Electronically Signed   By: HGenevie AnnM.D.   On: 11/21/2017 21:33     Subjective: Seen and examined at bedside and was doing well.  Facial tenderness had improved as well as sinus tenderness.  No nausea, vomiting, shortness breath, chest pain.  Ready to go home and is already dressed.  Discharge Exam: Vitals:   11/22/17 2016 11/23/17 0500  BP: 116/65 124/75  Pulse: 68 60  Resp: 18 18  Temp: 99.5 F (37.5 C) 99.1 F (37.3 C)  SpO2: 100% 98%   Vitals:   11/22/17 0519 11/22/17 1330 11/22/17 2016 11/23/17 0500  BP: 122/70 114/70 116/65 124/75  Pulse: (!) 52 61 68 60  Resp: 18 14 18 18   $ Temp: 97.7 F (36.5 C) 99.3 F (37.4 C) 99.5 F (37.5 C) 99.1 F (37.3 C)  TempSrc: Oral Oral Oral Oral  SpO2: 100% 100% 100% 98%  Weight:      Height:       General: Pt is alert, awake, not in acute distress Cardiovascular: RRR, S1/S2 +, no rubs, no gallops Respiratory: Diminished bilaterally, no wheezing, no rhonchi Abdominal: Soft, NT, Distended due to body habiuts, bowel sounds + Extremities: no edema, no cyanosis  The results of significant diagnostics from this hospitalization (including imaging, microbiology, ancillary and laboratory) are listed below for reference.  Microbiology: Recent Results (from the past 240 hour(s))  Blood culture (routine x 2)     Status: None (Preliminary result)   Collection Time: 11/21/17 10:30 PM  Result Value Ref Range Status   Specimen Description   Final    BLOOD RIGHT ANTECUBITAL Performed at [facility] [address], [place], [place] [address]    Special Requests   Final    BOTTLES DRAWN AEROBIC AND ANAEROBIC Blood Culture results may not be optimal due to an excessive volume of blood received in culture bottles Performed at [facility] [address], [place], [place] [address]    Culture   Final    NO GROWTH 1 DAY Performed at [facility], [place] [address], [place], [place] [address]    Report Status PENDING  Incomplete  Blood culture (routine x 2)     Status: None (Preliminary result)   Collection Time: 11/21/17 10:37 PM  Result Value Ref Range Status   Specimen Description   Final    BLOOD BLOOD RIGHT FOREARM Performed at [facility] [address], [place], [place] [address]    Special Requests   Final    BOTTLES DRAWN AEROBIC AND ANAEROBIC Blood Culture adequate volume Performed at [facility] [address] [place], [place] [address]    Culture   Final    NO GROWTH 1 DAY Performed at [facility], [place] [address],  [place], [place] [address]    Report Status PENDING  Incomplete    Labs: BNP (last 3 results) No results for input(s): BNP in the last 8760 hours. Basic Metabolic Panel: Recent Labs  Lab 11/21/17 2049 11/21/17 2230 11/22/17 1203 11/23/17 0509  NA 140 139 139 142  K 3.6 3.7 3.4* 4.4  CL 99* 103 105 107  CO2  --  26 26 26  $ GLUCOSE 231* 182* 81 92  BUN 4* 6 5* 7  CREATININE 0.70 0.72 0.63 0.69  CALCIUM  --  9.4 9.0 9.0  MG  --   --  1.8 1.8  PHOS  --   --  2.6 3.4   Liver Function Tests: Recent Labs  Lab 11/22/17 1203 11/23/17 0509  AST 14* 12*  ALT 14 13*  ALKPHOS 74 62  BILITOT 0.3 0.4  PROT 7.3 6.3*  ALBUMIN 3.5 3.0*   No results for input(s): LIPASE, AMYLASE in the last 168 hours. No results for input(s): AMMONIA in the last 168 hours. CBC: Recent Labs  Lab 11/21/17 2049 11/21/17 2230 11/22/17 1203 11/23/17 0509  WBC  --  6.9 4.4 4.3  NEUTROABS  --  5.1 2.7 2.7  HGB 12.6 11.5* 11.4* 10.8*  HCT 37.0 36.9 36.4 34.7*  MCV  --  85.2 85.8 86.1  PLT  --  265 236 245   Cardiac Enzymes: No results for input(s): CKTOTAL, CKMB, CKMBINDEX, TROPONINI in the last 168 hours. BNP: Invalid input(s): POCBNP CBG: Recent Labs  Lab 11/22/17 0752 11/22/17 1136 11/22/17 1628 11/22/17 2116 11/23/17 0735  GLUCAP 83 101* 92 117* 86   D-Dimer No results for input(s): DDIMER in the last 72 hours. Hgb A1c No results for input(s): HGBA1C in the last 72 hours. Lipid Profile No results for input(s): CHOL, HDL, LDLCALC, TRIG, CHOLHDL, LDLDIRECT in the last 72 hours. Thyroid function studies No results for input(s): TSH, T4TOTAL, T3FREE, THYROIDAB in the last 72 hours.  Invalid input(s): FREET3 Anemia work up No results for input(s):  VITAMINB12, FOLATE, FERRITIN, TIBC, IRON, RETICCTPCT in the last 72 hours. Urinalysis    Component Value Date/Time   COLORURINE AMBER BIOCHEMICALS MAY BE AFFECTED BY COLOR (A) 10/01/2009 2028   APPEARANCEUR CLEAR 10/01/2009 2028   LABSPEC  1.030 10/01/2009 2028   PHURINE 5.5 10/01/2009 2028   GLUCOSEU NEGATIVE 10/01/2009 2028   HGBUR NEGATIVE 10/01/2009 2028   BILIRUBINUR NEGATIVE 10/01/2009 2028   KETONESUR 15 (A) 10/01/2009 2028   PROTEINUR 100 (A) 10/01/2009 2028   UROBILINOGEN 1.0 10/01/2009 2028   NITRITE NEGATIVE 10/01/2009 2028   LEUKOCYTESUR NEGATIVE 10/01/2009 2028   Sepsis Labs Invalid input(s): PROCALCITONIN,  WBC,  LACTICIDVEN Microbiology Recent Results (from the past 240 hour(s))  Blood culture (routine x 2)     Status: None (Preliminary result)   Collection Time: 11/21/17 10:30 PM  Result Value Ref Range Status   Specimen Description   Final    BLOOD RIGHT ANTECUBITAL Performed at [facility], [place] [address] [place], [place] [address]    Special Requests   Final    BOTTLES DRAWN AEROBIC AND ANAEROBIC Blood Culture results may not be optimal due to an excessive volume of blood received in culture bottles Performed at [facility] [address] [place], [place] [address]    Culture   Final    NO GROWTH 1 DAY Performed at [facility], [place] [address] [place], [place] [address]    Report Status PENDING  Incomplete  Blood culture (routine x 2)     Status: None (Preliminary result)   Collection Time: 11/21/17 10:37 PM  Result Value Ref Range Status   Specimen Description   Final    BLOOD BLOOD RIGHT FOREARM Performed at [facility] [address], [place], [place] [address]    Special Requests   Final    BOTTLES DRAWN AEROBIC AND ANAEROBIC Blood Culture adequate volume Performed at [facility] [address], [place], [place] [address]    Culture   Final    NO GROWTH 1 DAY Performed at [facility], [place] [address], [place], [place] [address]    Report Status PENDING  Incomplete   Time coordinating discharge: 25 minutes  SIGNED:  Kerney Elbe, DO [facility] 11/23/2017, 10:17  PM Pager [phone]  If 7PM-7AM, please contact night-coverage [email or web address] Password [email or web address]

## 2017-11-23 NOTE — Progress Notes (Signed)
Patient d/c on clindamycin-goodrx discount coupon $12.77-patient has already left,but nsg called for her to return to get discount coupon left @ nurse's station. No further CM needs.

## 2017-11-25 DIAGNOSIS — J329 Chronic sinusitis, unspecified: Secondary | ICD-10-CM | POA: Diagnosis not present

## 2017-11-27 LAB — CULTURE, BLOOD (ROUTINE X 2)
Culture: NO GROWTH
Culture: NO GROWTH
Special Requests: ADEQUATE

## 2017-12-03 DIAGNOSIS — J324 Chronic pansinusitis: Secondary | ICD-10-CM | POA: Diagnosis not present

## 2017-12-10 ENCOUNTER — Other Ambulatory Visit: Payer: Self-pay | Admitting: Internal Medicine

## 2017-12-10 DIAGNOSIS — E119 Type 2 diabetes mellitus without complications: Secondary | ICD-10-CM | POA: Diagnosis not present

## 2017-12-10 DIAGNOSIS — H01022 Squamous blepharitis right lower eyelid: Secondary | ICD-10-CM | POA: Diagnosis not present

## 2017-12-10 DIAGNOSIS — Z1231 Encounter for screening mammogram for malignant neoplasm of breast: Secondary | ICD-10-CM

## 2017-12-10 DIAGNOSIS — H2513 Age-related nuclear cataract, bilateral: Secondary | ICD-10-CM | POA: Diagnosis not present

## 2017-12-10 DIAGNOSIS — H01021 Squamous blepharitis right upper eyelid: Secondary | ICD-10-CM | POA: Diagnosis not present

## 2017-12-10 DIAGNOSIS — H10413 Chronic giant papillary conjunctivitis, bilateral: Secondary | ICD-10-CM | POA: Diagnosis not present

## 2017-12-10 DIAGNOSIS — H01024 Squamous blepharitis left upper eyelid: Secondary | ICD-10-CM | POA: Diagnosis not present

## 2017-12-10 DIAGNOSIS — H40013 Open angle with borderline findings, low risk, bilateral: Secondary | ICD-10-CM | POA: Diagnosis not present

## 2017-12-10 DIAGNOSIS — H01025 Squamous blepharitis left lower eyelid: Secondary | ICD-10-CM | POA: Diagnosis not present

## 2017-12-27 DIAGNOSIS — G4733 Obstructive sleep apnea (adult) (pediatric): Secondary | ICD-10-CM | POA: Diagnosis not present

## 2017-12-29 DIAGNOSIS — J4542 Moderate persistent asthma with status asthmaticus: Secondary | ICD-10-CM | POA: Diagnosis not present

## 2017-12-29 DIAGNOSIS — I10 Essential (primary) hypertension: Secondary | ICD-10-CM | POA: Diagnosis not present

## 2017-12-29 DIAGNOSIS — E1140 Type 2 diabetes mellitus with diabetic neuropathy, unspecified: Secondary | ICD-10-CM | POA: Diagnosis not present

## 2017-12-29 DIAGNOSIS — J441 Chronic obstructive pulmonary disease with (acute) exacerbation: Secondary | ICD-10-CM | POA: Diagnosis not present

## 2017-12-29 DIAGNOSIS — J45909 Unspecified asthma, uncomplicated: Secondary | ICD-10-CM | POA: Diagnosis not present

## 2017-12-29 DIAGNOSIS — E782 Mixed hyperlipidemia: Secondary | ICD-10-CM | POA: Diagnosis not present

## 2017-12-29 DIAGNOSIS — J449 Chronic obstructive pulmonary disease, unspecified: Secondary | ICD-10-CM | POA: Diagnosis not present

## 2017-12-29 DIAGNOSIS — J4541 Moderate persistent asthma with (acute) exacerbation: Secondary | ICD-10-CM | POA: Diagnosis not present

## 2018-01-04 DIAGNOSIS — J383 Other diseases of vocal cords: Secondary | ICD-10-CM | POA: Diagnosis not present

## 2018-01-04 DIAGNOSIS — J449 Chronic obstructive pulmonary disease, unspecified: Secondary | ICD-10-CM | POA: Diagnosis not present

## 2018-01-04 DIAGNOSIS — R05 Cough: Secondary | ICD-10-CM | POA: Diagnosis not present

## 2018-01-10 ENCOUNTER — Ambulatory Visit
Admission: RE | Admit: 2018-01-10 | Discharge: 2018-01-10 | Disposition: A | Discharge status: Home / Self Care | Payer: Medicare HMO | Source: Ambulatory Visit | Attending: Internal Medicine | Admitting: Internal Medicine

## 2018-01-10 DIAGNOSIS — Z1231 Encounter for screening mammogram for malignant neoplasm of breast: Secondary | ICD-10-CM

## 2018-01-11 DIAGNOSIS — R69 Illness, unspecified: Secondary | ICD-10-CM | POA: Diagnosis not present

## 2018-01-11 DIAGNOSIS — F419 Anxiety disorder, unspecified: Secondary | ICD-10-CM | POA: Diagnosis not present

## 2018-01-11 DIAGNOSIS — E669 Obesity, unspecified: Secondary | ICD-10-CM | POA: Diagnosis not present

## 2018-01-11 DIAGNOSIS — I10 Essential (primary) hypertension: Secondary | ICD-10-CM | POA: Diagnosis not present

## 2018-01-11 DIAGNOSIS — E1142 Type 2 diabetes mellitus with diabetic polyneuropathy: Secondary | ICD-10-CM | POA: Diagnosis not present

## 2018-01-11 DIAGNOSIS — E1143 Type 2 diabetes mellitus with diabetic autonomic (poly)neuropathy: Secondary | ICD-10-CM | POA: Diagnosis not present

## 2018-01-11 DIAGNOSIS — G8929 Other chronic pain: Secondary | ICD-10-CM | POA: Diagnosis not present

## 2018-01-11 DIAGNOSIS — G4730 Sleep apnea, unspecified: Secondary | ICD-10-CM | POA: Diagnosis not present

## 2018-01-11 DIAGNOSIS — G4700 Insomnia, unspecified: Secondary | ICD-10-CM | POA: Diagnosis not present

## 2018-01-11 DIAGNOSIS — E785 Hyperlipidemia, unspecified: Secondary | ICD-10-CM | POA: Diagnosis not present

## 2018-02-16 DIAGNOSIS — J370 Chronic laryngitis: Secondary | ICD-10-CM | POA: Diagnosis not present

## 2018-02-16 DIAGNOSIS — J4542 Moderate persistent asthma with status asthmaticus: Secondary | ICD-10-CM | POA: Diagnosis not present

## 2018-02-16 DIAGNOSIS — J383 Other diseases of vocal cords: Secondary | ICD-10-CM | POA: Diagnosis not present

## 2018-03-10 ENCOUNTER — Emergency Department (HOSPITAL_COMMUNITY): Payer: Medicare HMO

## 2018-03-10 ENCOUNTER — Other Ambulatory Visit: Payer: Self-pay

## 2018-03-10 ENCOUNTER — Emergency Department (HOSPITAL_COMMUNITY)
Admission: EM | Admit: 2018-03-10 | Discharge: 2018-03-10 | Disposition: A | Discharge status: Home / Self Care | Payer: Medicare HMO | Attending: Emergency Medicine | Admitting: Emergency Medicine

## 2018-03-10 ENCOUNTER — Encounter (HOSPITAL_COMMUNITY): Payer: Self-pay | Admitting: *Deleted

## 2018-03-10 DIAGNOSIS — Z79899 Other long term (current) drug therapy: Secondary | ICD-10-CM | POA: Diagnosis not present

## 2018-03-10 DIAGNOSIS — R0789 Other chest pain: Secondary | ICD-10-CM | POA: Diagnosis not present

## 2018-03-10 DIAGNOSIS — Z794 Long term (current) use of insulin: Secondary | ICD-10-CM | POA: Insufficient documentation

## 2018-03-10 DIAGNOSIS — J449 Chronic obstructive pulmonary disease, unspecified: Secondary | ICD-10-CM | POA: Diagnosis not present

## 2018-03-10 DIAGNOSIS — Z7982 Long term (current) use of aspirin: Secondary | ICD-10-CM | POA: Diagnosis not present

## 2018-03-10 DIAGNOSIS — E119 Type 2 diabetes mellitus without complications: Secondary | ICD-10-CM | POA: Insufficient documentation

## 2018-03-10 DIAGNOSIS — Z87891 Personal history of nicotine dependence: Secondary | ICD-10-CM | POA: Diagnosis not present

## 2018-03-10 DIAGNOSIS — R0602 Shortness of breath: Secondary | ICD-10-CM | POA: Diagnosis not present

## 2018-03-10 DIAGNOSIS — R05 Cough: Secondary | ICD-10-CM | POA: Diagnosis not present

## 2018-03-10 LAB — CBC WITH DIFFERENTIAL/PLATELET
Abs Immature Granulocytes: 0 10*3/uL (ref 0.0–0.1)
Basophils Absolute: 0.1 10*3/uL (ref 0.0–0.1)
Basophils Relative: 1 %
Eosinophils Absolute: 0.2 10*3/uL (ref 0.0–0.7)
Eosinophils Relative: 4 %
HCT: 42.8 % (ref 36.0–46.0)
Hemoglobin: 12.9 g/dL (ref 12.0–15.0)
Immature Granulocytes: 0 %
Lymphocytes Relative: 19 %
Lymphs Abs: 1.3 10*3/uL (ref 0.7–4.0)
MCH: 25.9 pg — ABNORMAL LOW (ref 26.0–34.0)
MCHC: 30.1 g/dL (ref 30.0–36.0)
MCV: 85.8 fL (ref 78.0–100.0)
Monocytes Absolute: 0.5 10*3/uL (ref 0.1–1.0)
Monocytes Relative: 7 %
Neutro Abs: 4.5 10*3/uL (ref 1.7–7.7)
Neutrophils Relative %: 69 %
Platelets: 252 10*3/uL (ref 150–400)
RBC: 4.99 MIL/uL (ref 3.87–5.11)
RDW: 14.6 % (ref 11.5–15.5)
WBC: 6.6 10*3/uL (ref 4.0–10.5)

## 2018-03-10 LAB — COMPREHENSIVE METABOLIC PANEL
ALT: 17 U/L (ref 0–44)
AST: 17 U/L (ref 15–41)
Albumin: 3.9 g/dL (ref 3.5–5.0)
Alkaline Phosphatase: 71 U/L (ref 38–126)
Anion gap: 11 (ref 5–15)
BUN: 7 mg/dL — ABNORMAL LOW (ref 8–23)
CO2: 27 mmol/L (ref 22–32)
Calcium: 9.5 mg/dL (ref 8.9–10.3)
Chloride: 101 mmol/L (ref 98–111)
Creatinine, Ser: 0.94 mg/dL (ref 0.44–1.00)
GFR calc Af Amer: >60 mL/min (ref >60)
GFR calc non Af Amer: >60 mL/min (ref >60)
Glucose, Bld: 102 mg/dL — ABNORMAL HIGH (ref 70–99)
Potassium: 3.8 mmol/L (ref 3.5–5.1)
Sodium: 139 mmol/L (ref 135–145)
Total Bilirubin: 0.7 mg/dL (ref 0.3–1.2)
Total Protein: 7.4 g/dL (ref 6.5–8.1)

## 2018-03-10 LAB — I-STAT TROPONIN, ED: Troponin i, poc: 0 ng/mL (ref 0.00–0.08)

## 2018-03-10 MED ORDER — ALBUTEROL SULFATE (2.5 MG/3ML) 0.083% IN NEBU
5.0000 mg | INHALATION_SOLUTION | Freq: Once | RESPIRATORY_TRACT | Status: AC
  Administered 2018-03-10: 5 mg via RESPIRATORY_TRACT
  Filled 2018-03-10: qty 6

## 2018-03-10 MED ORDER — ALBUTEROL SULFATE (2.5 MG/3ML) 0.083% IN NEBU
5.0000 mg | INHALATION_SOLUTION | Freq: Once | RESPIRATORY_TRACT | Status: AC
  Administered 2018-03-10: 5 mg via RESPIRATORY_TRACT

## 2018-03-10 MED ORDER — ALBUTEROL SULFATE (2.5 MG/3ML) 0.083% IN NEBU: 2.5000 mg | INHALATION_SOLUTION | Freq: Once | RESPIRATORY_TRACT | Status: DC

## 2018-03-10 MED ORDER — ALBUTEROL SULFATE (2.5 MG/3ML) 0.083% IN NEBU
INHALATION_SOLUTION | RESPIRATORY_TRACT | Status: AC
  Filled 2018-03-10: qty 3

## 2018-03-10 NOTE — ED Provider Notes (Signed)
Patient placed in Quick Look pathway, seen and evaluated   Chief Complaint: shortness of breath  HPI:   Lorina RabonDebbie D Fallas is a 62 y.o. female with medical history significant ofHTN, HLD,COPD, DM type II with gastroparesis, vocal cord dysfunction, and GERDand other comorbidswho presents with complaint of shortness of breath, cough and wheezing.   ROS: Resp: shortness of breath  Physical Exam:  BP (!) 178/107 (BP Location: Right Arm)   Pulse 75   Temp 98 F (36.7 C) (Oral)   Resp 20   SpO2 100%    Gen: patient appears to have difficulty breathing, O2 SAT dropped to the 80's but after breathing treatment returned to 100%  Neuro: Awake and Alert  Skin: Warm and dry  Lungs: decreased breath sounds, expiratory wheezing.        Initiation of care has begun. The patient has been counseled on the process, plan, and necessity for staying for the completion/evaluation, and the remainder of the medical screening examination    [name] Napoleoneese, Hope M, NP 03/10/18 1358    Arby BarrettePfeiffer, Marcy, MD 03/11/18 [phone]

## 2018-03-10 NOTE — ED Provider Notes (Signed)
[facility] [facility] EMERGENCY DEPARTMENT Provider Note   CSN: [id number] Arrival date & time: 03/10/18  1332     History   Chief Complaint Chief Complaint  Patient presents with  . Shortness of Breath  . Chest Pain    HPI [name] is a 62 y.o. female.  62 year old female with asthma COPD no prior cardiac history here with over a months worth of cough and shortness of breath.  She said she has had 2 rounds of steroids.  She still is waking up at night from cough.  She is complaining of intermittent chest pain that was worse today.  She was visiting her father in the hospital today and decided to come down and just make sure it was not her heart.  She is bringing up some thick white sputum but has had no fever.  She said she was prescribed antibiotics but did not take them because her sputum is only been white.  She follows with a pulmonologist.  No leg swelling.  The history is provided by the patient.  Chest Pain   This is a new problem. The current episode started more than 2 days ago. The problem occurs hourly. The problem has not changed since onset.The pain is associated with coughing. The pain is present in the substernal region and lateral region. The pain is moderate. The quality of the pain is described as sharp. The pain does not radiate. Associated symptoms include cough, dizziness, orthopnea, shortness of breath and sputum production. Pertinent negatives include no abdominal pain, no fever, no headaches, no hemoptysis, no leg pain, no lower extremity edema, no nausea, no syncope and no vomiting. Treatments tried: nebs. The treatment provided moderate relief.  Her past medical history is significant for COPD, diabetes, hyperlipidemia and hypertension.    Past Medical History:  Diagnosis Date  . Anxiety   . Arthritis    "both shoulders"  . Asthma   . Blood transfusion without reported diagnosis   . Chronic bronchitis ([place])   . Cold extremity without  peripheral vascular disease    bilaterally; "from my knees down into my feet; they get ice cold"  . COPD (chronic obstructive pulmonary disease) ([place])   . Environmental allergies    "year round"  . Exertional dyspnea   . Gastroparesis   . GERD (gastroesophageal reflux disease)   . Headache(784.0)   . Heart murmur   . Hiatal hernia   . High cholesterol   . HTN (hypertension)   . Obesity   . Rotator cuff tear   . Sciatic nerve pain    "tailbone down into my legs when it flares up"  . Sciatica   . Sleep apnea    CPAP, sleep study on [facility]  . Type II diabetes mellitus ([place])   . Vocal cord dysfunction    "I've had it for many years"    Patient Active Problem List   Diagnosis Date Noted  . H/O tooth extraction 11/22/2017  . Sinusitis 11/21/2017  . Irritable larynx syndrome 09/18/2015  . ILD (interstitial lung disease) ([place]) 10/22/2014  . Loss of weight 05/08/2014  . Rotator cuff tear 01/29/2012  . Chest pain 08/26/2011  . Allergic rhinitis, cause unspecified 08/08/2011  . Obstructive sleep apnea 08/08/2011  . Chronic cough 02/18/2011  . Dyspnea 02/18/2011  . Diabetes mellitus type 2, insulin dependent ([place]) 05/26/2007  . ANXIETY, SITUATIONAL 05/26/2007  . Essential hypertension 05/26/2007  . Asthma, chronic 05/26/2007  . GERD 05/26/2007  . LIPID  DISORDER, HX OF 05/26/2007    Past Surgical History:  Procedure Laterality Date  . ABDOMINAL ADHESION SURGERY  ~ 1978  . APPENDECTOMY  1976  . NASAL SINUS SURGERY  1992  . OOPHORECTOMY  1976   w/ovarian cyst excision and appy  . SHOULDER ARTHROSCOPY W/ ROTATOR CUFF REPAIR  01/28/12   left  . TOE SURGERY     for ingrown [id number] ?left  . TOTAL ABDOMINAL HYSTERECTOMY  1985     OB History   None      Home Medications    Prior to Admission medications   Medication Sig Start Date End Date Taking? Authorizing Provider  albuterol (PROVENTIL HFA;VENTOLIN HFA) 108 (90 BASE) MCG/ACT inhaler Inhale 2 puffs into the  lungs every 4 (four) hours as needed for wheezing or shortness of breath.     [provider]  ALPRAZolam Duanne Moron) 0.5 MG tablet Take 0.5 mg by mouth 2 (two) times daily as needed for anxiety.     [provider]  aspirin 81 MG tablet Take 81 mg by mouth 2 (two) times daily.     [provider]  atorvastatin (LIPITOR) 20 MG tablet Take 20 mg by mouth every evening.     [provider]  Azelastine-Fluticasone (DYMISTA) 137-50 MCG/ACT SUSP Place 1 spray into the nose daily as needed (congestion).     [provider]  citalopram (CELEXA) 10 MG tablet Take 20 mg by mouth every evening.  05/08/13   [provider]  fluticasone (FLONASE) 50 MCG/ACT nasal spray Place 2 sprays into both nostrils daily. 11/24/17   Raiford Noble Latif, DO  gabapentin (NEURONTIN) 300 MG capsule Take 300 mg by mouth 3 (three) times daily.  05/08/13   [provider]  insulin aspart protamine - aspart (NOVOLOG MIX 70/30 FLEXPEN) (70-30) 100 UNIT/ML FlexPen Inject 15 units subcutaneous twice daily    [provider]  linaclotide (LINZESS) 290 MCG CAPS capsule Take 1 capsule (290 mcg total) by mouth daily before breakfast. 10/28/17   Pyrtle, Lajuan Lines, MD  loratadine (CLARITIN) 10 MG tablet Take 10 mg by mouth daily as needed for allergies.     [provider]  losartan (COZAAR) 100 MG tablet Take 100 mg by mouth daily.     [provider]  metFORMIN (GLUCOPHAGE) 1000 MG tablet Take 1,000 mg by mouth 2 (two) times daily.  04/15/13   [provider]  metoCLOPramide (REGLAN) 5 MG tablet Take 1 tablet by mouth before meals and at bedtime as needed Patient taking differently: Take 5 mg by mouth every 6 (six) hours as needed for nausea or vomiting.  10/28/17   Pyrtle, Lajuan Lines, MD  oxyCODONE (OXY IR/ROXICODONE) 5 MG immediate release tablet Take 1 tablet (5 mg total) by mouth every 6 (six) hours as needed for severe pain (From Pain not controlled by  Tramadol). 11/23/17   Raiford Noble Latif, DO  pantoprazole (PROTONIX) 40 MG tablet Take 1 tablet (40 mg total) by mouth 2 (two) times daily. Patient taking differently: Take 40 mg by mouth daily.  06/17/15   Hvozdovic, Lori P, PA-C  traMADol (ULTRAM) 50 MG tablet Take 50 mg by mouth every 6 (six) hours as needed for pain.     [provider]  VICTOZA 18 MG/3ML SOPN Inject 18 mg into the skin every morning.  03/14/13   [provider]  zolpidem (AMBIEN) 5 MG tablet Take 5 mg by mouth at bedtime. 11/09/17   [provider]  Family History Family History  Problem Relation Age of Onset  . Heart disease Mother   . Asthma Mother   . Heart failure Maternal Grandmother   . Asthma Son   . Asthma Daughter   . Lupus Daughter   . Diabetes Unknown        "everybody on both sides"  . Colon cancer Neg Hx   . Throat cancer Neg Hx   . Stomach cancer Neg Hx   . Esophageal cancer Neg Hx   . Rectal cancer Neg Hx     Social History Social History   Tobacco Use  . Smoking status: Former Smoker    Packs/day: 1.00    Years: 25.00    Pack years: 25.00    Types: Cigarettes    Last attempt to quit: 07/13/1996    Years since quitting: 21.6  . Smokeless tobacco: Never Used  Substance Use Topics  . Alcohol use: No    Comment: 01/29/12 "used to abuse alcohol; last drink was in the 1980's"  . Drug use: No     Allergies   Lisinopril   Review of Systems Review of Systems  Constitutional: Negative for fever.  HENT: Positive for sinus pressure. Negative for sore throat.   Eyes: Negative for visual disturbance.  Respiratory: Positive for cough, sputum production, shortness of breath and wheezing. Negative for hemoptysis.   Cardiovascular: Positive for chest pain and orthopnea. Negative for leg swelling and syncope.  Gastrointestinal: Negative for abdominal pain, nausea and vomiting.  Genitourinary: Negative for dysuria.  Musculoskeletal: Negative for neck pain.  Skin:  Negative for rash.  Neurological: Positive for dizziness. Negative for headaches.     Physical Exam Updated Vital Signs BP (!) 178/107 (BP Location: Right Arm)   Pulse 75   Temp 98 F (36.7 C) (Oral)   Resp 20   SpO2 100%   Physical Exam  Constitutional: She appears well-developed and well-nourished. No distress.  HENT:  Head: Normocephalic and atraumatic.  Eyes: Conjunctivae are normal.  Neck: Neck supple.  Cardiovascular: Normal rate and regular rhythm.  No murmur heard. Pulmonary/Chest: Effort normal and breath sounds normal. No respiratory distress.  Abdominal: Soft. There is no tenderness.  Musculoskeletal: She exhibits no edema.       Right lower leg: She exhibits no tenderness and no edema.       Left lower leg: She exhibits no tenderness and no edema.  Neurological: She is alert.  Skin: Skin is warm and dry. Capillary refill takes less than 2 seconds.  Psychiatric: She has a normal mood and affect.  Nursing note and vitals reviewed.    ED Treatments / Results  Labs (all labs ordered are listed, but only abnormal results are displayed) Labs Reviewed  CBC WITH DIFFERENTIAL/PLATELET - Abnormal; Notable for the following components:      Result Value   MCH 25.9 (*)    All other components within normal limits  COMPREHENSIVE METABOLIC PANEL - Abnormal; Notable for the following components:   Glucose, Bld 102 (*)    BUN 7 (*)    All other components within normal limits  I-STAT TROPONIN, ED    EKG EKG Interpretation  Date/Time:  Thursday March 10 2018 13:36:26 EDT Ventricular Rate:  78 PR Interval:  140 QRS Duration: 64 QT Interval:  366 QTC Calculation: 417 R Axis:   97 Text Interpretation:  Normal sinus rhythm with sinus arrhythmia Rightward axis Nonspecific T wave abnormality Abnormal ECG similar pattern to prior 8/18 Confirmed by  Aletta Edouard [id number]) on 03/10/2018 3:00:34 PM   Radiology Dg Chest 2 View  Result Date: 03/10/2018 CLINICAL DATA:   Shortness of breath, cough and wheezing. EXAM: CHEST - 2 VIEW COMPARISON:  CT of the chest 08/19/2017 FINDINGS: Cardiomediastinal silhouette is mildly enlarged. Mediastinal contours appear intact. There is no evidence of focal airspace consolidation, pleural effusion or pneumothorax. Osseous structures are without acute abnormality. Soft tissues are grossly normal. IMPRESSION: No active cardiopulmonary disease. Mild enlargement of the cardiac silhouette. Electronically Signed   By: Fidela Salisbury M.D.   On: 03/10/2018 14:54    Procedures Procedures (including critical care time)  Medications Ordered in ED Medications  albuterol (PROVENTIL) (2.5 MG/3ML) 0.083% nebulizer solution (  Not Given 03/10/18 1415)  albuterol (PROVENTIL) (2.5 MG/3ML) 0.083% nebulizer solution 5 mg (5 mg Nebulization Given 03/10/18 1344)  albuterol (PROVENTIL) (2.5 MG/3ML) 0.083% nebulizer solution 5 mg (5 mg Nebulization Given 03/10/18 1415)     Initial Impression / Assessment and Plan / ED Course  I have reviewed the triage vital signs and the nursing notes.  Pertinent labs & imaging results that were available during my care of the patient were reviewed by me and considered in my medical decision making (see chart for details).  Clinical Course as of Mar 11 917  Thu Mar 10, 5049  4482 62 year old female complaining of intermittent chest pain and soreness.  She said she had more of it today as she was visiting her father who was going for catheterization.  Any chest pain right now has an unremarkable EKG and troponin negative.  Sounds like her sats were in the 80s when she got here but she is currently 95 on room air and has got clear lungs after few neb treatments.  She has some reproducible chest wall tenderness.  She is hoping to go home and will see any reason why she should need a second troponin is this pain does not sound cardiac.   [MB]    Clinical Course User Index [MB] Hayden Rasmussen, MD    Final  Clinical Impressions(s) / ED Diagnoses   Final diagnoses:  Atypical chest pain    ED Discharge Orders    None       Hayden Rasmussen, MD 03/11/18 [phone]

## 2018-03-10 NOTE — ED Triage Notes (Signed)
Pt in c/o shortness of breath and chest pain for the last few days, pt has chronic bronchitis and has been having a flair, had two rounds of steroids without improvement, wheezing noted in triage

## 2018-03-10 NOTE — Discharge Instructions (Addendum)
Your evaluated in the emergency department for chest pain that you are having in the setting of 1 month of cough and shortness of breath.  You had a chest x-ray blood work and EKG that did not show an obvious cause of your pain.  You should follow-up with your doctor for further evaluation and return if any concerns.

## 2018-03-17 DIAGNOSIS — J449 Chronic obstructive pulmonary disease, unspecified: Secondary | ICD-10-CM | POA: Diagnosis not present

## 2018-03-17 DIAGNOSIS — J309 Allergic rhinitis, unspecified: Secondary | ICD-10-CM | POA: Diagnosis not present

## 2018-03-29 DIAGNOSIS — G4733 Obstructive sleep apnea (adult) (pediatric): Secondary | ICD-10-CM | POA: Diagnosis not present

## 2018-04-15 DIAGNOSIS — R69 Illness, unspecified: Secondary | ICD-10-CM | POA: Diagnosis not present

## 2018-05-10 DIAGNOSIS — R69 Illness, unspecified: Secondary | ICD-10-CM | POA: Diagnosis not present

## 2018-05-10 DIAGNOSIS — K219 Gastro-esophageal reflux disease without esophagitis: Secondary | ICD-10-CM | POA: Diagnosis not present

## 2018-05-10 DIAGNOSIS — I10 Essential (primary) hypertension: Secondary | ICD-10-CM | POA: Diagnosis not present

## 2018-05-10 DIAGNOSIS — E782 Mixed hyperlipidemia: Secondary | ICD-10-CM | POA: Diagnosis not present

## 2018-05-10 DIAGNOSIS — K5900 Constipation, unspecified: Secondary | ICD-10-CM | POA: Diagnosis not present

## 2018-05-10 DIAGNOSIS — K3184 Gastroparesis: Secondary | ICD-10-CM | POA: Diagnosis not present

## 2018-05-10 DIAGNOSIS — G4730 Sleep apnea, unspecified: Secondary | ICD-10-CM | POA: Diagnosis not present

## 2018-05-10 DIAGNOSIS — J383 Other diseases of vocal cords: Secondary | ICD-10-CM | POA: Diagnosis not present

## 2018-05-10 DIAGNOSIS — J4542 Moderate persistent asthma with status asthmaticus: Secondary | ICD-10-CM | POA: Diagnosis not present

## 2018-05-10 DIAGNOSIS — E1140 Type 2 diabetes mellitus with diabetic neuropathy, unspecified: Secondary | ICD-10-CM | POA: Diagnosis not present

## 2018-07-26 ENCOUNTER — Emergency Department (HOSPITAL_COMMUNITY)
Admission: EM | Admit: 2018-07-26 | Discharge: 2018-07-26 | Disposition: A | Discharge status: Home / Self Care | Payer: Medicare HMO | Attending: Emergency Medicine | Admitting: Emergency Medicine

## 2018-07-26 ENCOUNTER — Emergency Department (HOSPITAL_COMMUNITY): Payer: Medicare HMO

## 2018-07-26 ENCOUNTER — Encounter (HOSPITAL_COMMUNITY): Payer: Self-pay

## 2018-07-26 ENCOUNTER — Other Ambulatory Visit: Payer: Self-pay

## 2018-07-26 DIAGNOSIS — J209 Acute bronchitis, unspecified: Secondary | ICD-10-CM | POA: Diagnosis not present

## 2018-07-26 DIAGNOSIS — R079 Chest pain, unspecified: Secondary | ICD-10-CM | POA: Diagnosis not present

## 2018-07-26 DIAGNOSIS — I10 Essential (primary) hypertension: Secondary | ICD-10-CM | POA: Diagnosis not present

## 2018-07-26 DIAGNOSIS — Z87891 Personal history of nicotine dependence: Secondary | ICD-10-CM | POA: Diagnosis not present

## 2018-07-26 DIAGNOSIS — Z7982 Long term (current) use of aspirin: Secondary | ICD-10-CM | POA: Diagnosis not present

## 2018-07-26 DIAGNOSIS — E119 Type 2 diabetes mellitus without complications: Secondary | ICD-10-CM | POA: Insufficient documentation

## 2018-07-26 DIAGNOSIS — R0789 Other chest pain: Secondary | ICD-10-CM | POA: Insufficient documentation

## 2018-07-26 DIAGNOSIS — Z79899 Other long term (current) drug therapy: Secondary | ICD-10-CM | POA: Diagnosis not present

## 2018-07-26 DIAGNOSIS — R0689 Other abnormalities of breathing: Secondary | ICD-10-CM | POA: Diagnosis not present

## 2018-07-26 DIAGNOSIS — J449 Chronic obstructive pulmonary disease, unspecified: Secondary | ICD-10-CM | POA: Insufficient documentation

## 2018-07-26 DIAGNOSIS — Z794 Long term (current) use of insulin: Secondary | ICD-10-CM | POA: Insufficient documentation

## 2018-07-26 DIAGNOSIS — R071 Chest pain on breathing: Secondary | ICD-10-CM | POA: Diagnosis not present

## 2018-07-26 DIAGNOSIS — R9431 Abnormal electrocardiogram [ECG] [EKG]: Secondary | ICD-10-CM | POA: Diagnosis not present

## 2018-07-26 LAB — BASIC METABOLIC PANEL
Anion gap: 11 (ref 5–15)
BUN: 12 mg/dL (ref 8–23)
CO2: 26 mmol/L (ref 22–32)
Calcium: 9.9 mg/dL (ref 8.9–10.3)
Chloride: 102 mmol/L (ref 98–111)
Creatinine, Ser: 0.97 mg/dL (ref 0.44–1.00)
GFR calc Af Amer: >60 mL/min (ref >60)
GFR calc non Af Amer: >60 mL/min (ref >60)
Glucose, Bld: 84 mg/dL (ref 70–99)
Potassium: 3.8 mmol/L (ref 3.5–5.1)
Sodium: 139 mmol/L (ref 135–145)

## 2018-07-26 LAB — CBC
HCT: 39.9 % (ref 36.0–46.0)
Hemoglobin: 12.6 g/dL (ref 12.0–15.0)
MCH: 27 pg (ref 26.0–34.0)
MCHC: 31.6 g/dL (ref 30.0–36.0)
MCV: 85.6 fL (ref 80.0–100.0)
Platelets: 250 10*3/uL (ref 150–400)
RBC: 4.66 MIL/uL (ref 3.87–5.11)
RDW: 13.9 % (ref 11.5–15.5)
WBC: 5.2 10*3/uL (ref 4.0–10.5)
nRBC: 0 % (ref 0.0–0.2)

## 2018-07-26 LAB — I-STAT TROPONIN, ED: Troponin i, poc: 0 ng/mL (ref 0.00–0.08)

## 2018-07-26 MED ORDER — PREDNISONE 10 MG PO TABS: 20.0000 mg | ORAL_TABLET | Freq: Two times a day (BID) | ORAL | 0 refills | Status: DC

## 2018-07-26 MED ORDER — METHYLPREDNISOLONE SODIUM SUCC 125 MG IJ SOLR
125.0000 mg | Freq: Once | INTRAMUSCULAR | Status: AC
  Administered 2018-07-26: 125 mg via INTRAMUSCULAR
  Filled 2018-07-26: qty 2

## 2018-07-26 NOTE — Discharge Instructions (Addendum)
Prednisone as prescribed.  Return to the emergency department for worsening breathing, severe chest pain, or other new and concerning symptoms.

## 2018-07-26 NOTE — ED Triage Notes (Signed)
Pt here with central chest pain and having tightness in her chest for the last day.  A&ox4 with clear lungs.  Pt states she gets short of breath off and on. Hx of DM, Htn, COPD.

## 2018-07-26 NOTE — ED Provider Notes (Signed)
[facility] EMERGENCY DEPARTMENT Provider Note   CSN: [id number] Arrival date & time: 07/26/18  1624     History   Chief Complaint Chief Complaint  Patient presents with  . Chest Pain    HPI [name] is a 63 y.o. female.  Patient is a 63 year old female with past medical history of asthma, COPD, chronic bronchitis, hypertension.  She presents today for evaluation of wheezing and chest discomfort.  She was seen at the doctor's office, then sent here for further evaluation as they felt as though her EKG looked abnormal.  She denies any nausea, diaphoresis, or radiation to the arm or jaw.  She does describe drainage and wheezing.  The history is provided by the patient.  Chest Pain  Pain location:  Substernal area Pain quality: tightness   Pain radiates to:  Does not radiate Pain severity:  Mild Onset quality:  Gradual Duration:  3 days Timing:  Constant Progression:  Worsening Chronicity:  New Relieved by:  Nothing Worsened by:  Nothing Ineffective treatments:  None tried   Past Medical History:  Diagnosis Date  . Anxiety   . Arthritis    "both shoulders"  . Asthma   . Blood transfusion without reported diagnosis   . Chronic bronchitis ([facility])   . Cold extremity without peripheral vascular disease    bilaterally; "from my knees down into my feet; they get ice cold"  . COPD (chronic obstructive pulmonary disease) ([facility])   . Environmental allergies    "year round"  . Exertional dyspnea   . Gastroparesis   . GERD (gastroesophageal reflux disease)   . Headache(784.0)   . Heart murmur   . Hiatal hernia   . High cholesterol   . HTN (hypertension)   . Obesity   . Rotator cuff tear   . Sciatic nerve pain    "tailbone down into my legs when it flares up"  . Sciatica   . Sleep apnea    CPAP, sleep study on [facility]  . Type II diabetes mellitus ([facility])   . Vocal cord dysfunction    "I've had it for many years"    Patient Active Problem List   Diagnosis Date Noted  . H/O tooth extraction 11/22/2017  . Sinusitis 11/21/2017  . Irritable larynx syndrome 09/18/2015  . ILD (interstitial lung disease) ([facility]) 10/22/2014  . Loss of weight 05/08/2014  . Rotator cuff tear 01/29/2012  . Chest pain 08/26/2011  . Allergic rhinitis, cause unspecified 08/08/2011  . Obstructive sleep apnea 08/08/2011  . Chronic cough 02/18/2011  . Dyspnea 02/18/2011  . Diabetes mellitus type 2, insulin dependent ([facility]) 05/26/2007  . ANXIETY, SITUATIONAL 05/26/2007  . Essential hypertension 05/26/2007  . Asthma, chronic 05/26/2007  . GERD 05/26/2007  . LIPID DISORDER, HX OF 05/26/2007    Past Surgical History:  Procedure Laterality Date  . ABDOMINAL ADHESION SURGERY  ~ 1978  . APPENDECTOMY  1976  . NASAL SINUS SURGERY  1992  . OOPHORECTOMY  1976   w/ovarian cyst excision and appy  . SHOULDER ARTHROSCOPY W/ ROTATOR CUFF REPAIR  01/28/12   left  . TOE SURGERY     for ingrown [id number] ?left  . TOTAL ABDOMINAL HYSTERECTOMY  1985     OB History   No obstetric history on file.      Home Medications    Prior to Admission medications   Medication Sig Start Date End Date Taking? Authorizing Provider  ALPRAZolam Prudy Feeler) 0.5 MG tablet Take 0.5 mg by  mouth 2 (two) times daily as needed for anxiety.    Yes [provider]  aspirin 81 MG tablet Take 81 mg by mouth 2 (two) times a week.    Yes [provider]  atorvastatin (LIPITOR) 20 MG tablet Take 20 mg by mouth every evening.    Yes [provider]  Azelastine-Fluticasone (DYMISTA) 137-50 MCG/ACT SUSP Place 1 spray into both nostrils daily as needed (for congestion).    Yes [provider]  albuterol (PROVENTIL HFA;VENTOLIN HFA) 108 (90 BASE) MCG/ACT inhaler Inhale 2 puffs into the lungs every 4 (four) hours as needed for wheezing or shortness of breath.     [provider]  citalopram (CELEXA) 10 MG tablet Take 20 mg by mouth every evening.  05/08/13    [provider]  fluticasone (FLONASE) 50 MCG/ACT nasal spray Place 2 sprays into both nostrils daily. 11/24/17   Marguerita Merles Latif, DO  gabapentin (NEURONTIN) 300 MG capsule Take 300 mg by mouth at bedtime.  05/08/13   [provider]  insulin aspart protamine - aspart (NOVOLOG MIX 70/30 FLEXPEN) (70-30) 100 UNIT/ML FlexPen     [provider]  linaclotide (LINZESS) 290 MCG CAPS capsule Take 1 capsule (290 mcg total) by mouth daily before breakfast. 10/28/17   Pyrtle, Carie Caddy, MD  loratadine (CLARITIN) 10 MG tablet Take 10 mg by mouth daily as needed for allergies.     [provider]  losartan (COZAAR) 100 MG tablet Take 100 mg by mouth daily.     [provider]  metFORMIN (GLUCOPHAGE) 1000 MG tablet Take 1,000 mg by mouth 2 (two) times daily.  04/15/13   [provider]  metoCLOPramide (REGLAN) 5 MG tablet Take 1 tablet by mouth before meals and at bedtime as needed Patient taking differently: Take 5 mg by mouth every 6 (six) hours as needed for nausea or vomiting.  10/28/17   Pyrtle, Carie Caddy, MD  oxyCODONE (OXY IR/ROXICODONE) 5 MG immediate release tablet Take 1 tablet (5 mg total) by mouth every 6 (six) hours as needed for severe pain (From Pain not controlled by Tramadol). 11/23/17   Marguerita Merles Latif, DO  pantoprazole (PROTONIX) 40 MG tablet Take 1 tablet (40 mg total) by mouth 2 (two) times daily. Patient taking differently: Take 40 mg by mouth daily.  06/17/15   Hvozdovic, Lori P, PA-C  traMADol (ULTRAM) 50 MG tablet Take 50 mg by mouth every 6 (six) hours as needed for pain.     [provider]  VICTOZA 18 MG/3ML SOPN Inject 18 mg into the skin every morning.  03/14/13   [provider]  zolpidem (AMBIEN) 5 MG tablet Take 5 mg by mouth at bedtime. 11/09/17   [provider]    Family History Family History  Problem Relation Age of Onset  . Heart disease Mother   . Asthma Mother   . Heart failure Maternal  Grandmother   . Asthma Son   . Asthma Daughter   . Lupus Daughter   . Diabetes Other        "everybody on both sides"  . Colon cancer Neg Hx   . Throat cancer Neg Hx   . Stomach cancer Neg Hx   . Esophageal cancer Neg Hx   . Rectal cancer Neg Hx     Social History Social History   Tobacco Use  . Smoking status: Former Smoker    Packs/day: 1.00    Years: 25.00    Pack  years: 25.00    Types: Cigarettes    Last attempt to quit: 07/13/1996    Years since quitting: 22.0  . Smokeless tobacco: Never Used  Substance Use Topics  . Alcohol use: No    Comment: 01/29/12 "used to abuse alcohol; last drink was in the 1980's"  . Drug use: No     Allergies   Lisinopril   Review of Systems Review of Systems  Cardiovascular: Positive for chest pain.  All other systems reviewed and are negative.    Physical Exam Updated Vital Signs BP 118/66   Pulse 68   Temp 98.6 F (37 C) (Oral)   Resp 17   SpO2 100%   Physical Exam Vitals signs and nursing note reviewed.  Constitutional:      General: She is not in acute distress.    Appearance: She is well-developed. She is not diaphoretic.  HENT:     Head: Normocephalic and atraumatic.  Neck:     Musculoskeletal: Normal range of motion and neck supple.  Cardiovascular:     Rate and Rhythm: Normal rate and regular rhythm.     Heart sounds: No murmur. No friction rub. No gallop.   Pulmonary:     Effort: Pulmonary effort is normal. No respiratory distress.     Breath sounds: Normal breath sounds. No wheezing.  Abdominal:     General: Bowel sounds are normal. There is no distension.     Palpations: Abdomen is soft.     Tenderness: There is no abdominal tenderness.  Musculoskeletal: Normal range of motion.     Right lower leg: She exhibits no tenderness. No edema.     Left lower leg: She exhibits no tenderness. No edema.  Skin:    General: Skin is warm and dry.  Neurological:     Mental Status: She is alert and oriented to  person, place, and time.      ED Treatments / Results  Labs (all labs ordered are listed, but only abnormal results are displayed) Labs Reviewed  BASIC METABOLIC PANEL  CBC  I-STAT TROPONIN, ED    EKG EKG Interpretation  Date/Time:  Tuesday July 26 2018 16:45:15 EST Ventricular Rate:  84 PR Interval:  136 QRS Duration: 74 QT Interval:  386 QTC Calculation: 456 R Axis:   86 Text Interpretation:  Normal sinus rhythm Nonspecific T wave abnormality Abnormal ECG Confirmed by Geoffery LyonseLo, Jejuan Scala ([id number]) on 07/26/2018 6:17:57 PM   Radiology Dg Chest 2 View  Result Date: 07/26/2018 CLINICAL DATA:  Midline chest pain to the right as well, short of breath for 2 days, former smoking history EXAM: CHEST - 2 VIEW COMPARISON:  Chest x-ray of 03/10/2017 FINDINGS: No active infiltrate or effusion is seen. Mediastinal and hilar contours are unremarkable. Mild cardiomegaly is stable. No acute bony abnormality is seen. IMPRESSION: Stable mild cardiomegaly.  No active lung disease. Electronically Signed   By: Dwyane DeePaul  Barry M.D.   On: 07/26/2018 17:15    Procedures Procedures (including critical care time)  Medications Ordered in ED Medications  methylPREDNISolone sodium succinate (SOLU-MEDROL) 125 mg/2 mL injection 125 mg (has no administration in time range)     Initial Impression / Assessment and Plan / ED Course  I have reviewed the triage vital signs and the nursing notes.  Pertinent labs & imaging results that were available during my care of the patient were reviewed by me and considered in my medical decision making (see chart for details).  Patient sent from primary doctor's office  for evaluation of chest discomfort and wheezing.  She tells me that she feels as though her bronchitis is acting up and is requesting a steroid.  She has no prior cardiac history and cardiac work-up is unremarkable.  She does have nonspecific T wave abnormalities, however these are unchanged from her EKG in  August of this year.  I see no indication for further cardiac work-up.  Patient will be given IM Solu-Medrol and discharged with prednisone as she tells me this has helped in the past when she has had these identical symptoms.  Final Clinical Impressions(s) / ED Diagnoses   Final diagnoses:  None    ED Discharge Orders    None       Geoffery Lyons, MD 07/26/18 [phone]

## 2018-07-26 NOTE — ED Notes (Signed)
Patient verbalizes understanding of discharge instructions. Opportunity for questioning and answers were provided. Armband removed by staff, pt discharged from ED.  

## 2018-07-27 ENCOUNTER — Other Ambulatory Visit: Payer: Self-pay | Admitting: Internal Medicine

## 2018-08-25 ENCOUNTER — Other Ambulatory Visit: Payer: Self-pay | Admitting: Internal Medicine

## 2018-08-25 ENCOUNTER — Ambulatory Visit
Admission: RE | Admit: 2018-08-25 | Discharge: 2018-08-25 | Disposition: A | Discharge status: Home / Self Care | Payer: Medicare HMO | Source: Ambulatory Visit | Attending: Internal Medicine | Admitting: Internal Medicine

## 2018-08-25 DIAGNOSIS — R042 Hemoptysis: Secondary | ICD-10-CM

## 2018-08-25 DIAGNOSIS — R0989 Other specified symptoms and signs involving the circulatory and respiratory systems: Secondary | ICD-10-CM | POA: Diagnosis not present

## 2018-08-25 DIAGNOSIS — R05 Cough: Secondary | ICD-10-CM | POA: Diagnosis not present

## 2018-08-25 DIAGNOSIS — J45909 Unspecified asthma, uncomplicated: Secondary | ICD-10-CM | POA: Diagnosis not present

## 2018-11-02 DIAGNOSIS — Z1389 Encounter for screening for other disorder: Secondary | ICD-10-CM | POA: Diagnosis not present

## 2018-11-02 DIAGNOSIS — G4700 Insomnia, unspecified: Secondary | ICD-10-CM | POA: Diagnosis not present

## 2018-11-02 DIAGNOSIS — E782 Mixed hyperlipidemia: Secondary | ICD-10-CM | POA: Diagnosis not present

## 2018-11-02 DIAGNOSIS — E1140 Type 2 diabetes mellitus with diabetic neuropathy, unspecified: Secondary | ICD-10-CM | POA: Diagnosis not present

## 2018-11-02 DIAGNOSIS — J4542 Moderate persistent asthma with status asthmaticus: Secondary | ICD-10-CM | POA: Diagnosis not present

## 2018-11-02 DIAGNOSIS — J383 Other diseases of vocal cords: Secondary | ICD-10-CM | POA: Diagnosis not present

## 2018-11-02 DIAGNOSIS — Z0000 Encounter for general adult medical examination without abnormal findings: Secondary | ICD-10-CM | POA: Diagnosis not present

## 2018-11-02 DIAGNOSIS — I10 Essential (primary) hypertension: Secondary | ICD-10-CM | POA: Diagnosis not present

## 2018-11-02 DIAGNOSIS — K3184 Gastroparesis: Secondary | ICD-10-CM | POA: Diagnosis not present

## 2018-11-02 DIAGNOSIS — G4730 Sleep apnea, unspecified: Secondary | ICD-10-CM | POA: Diagnosis not present

## 2018-11-09 DIAGNOSIS — E1140 Type 2 diabetes mellitus with diabetic neuropathy, unspecified: Secondary | ICD-10-CM | POA: Diagnosis not present

## 2018-11-09 DIAGNOSIS — I10 Essential (primary) hypertension: Secondary | ICD-10-CM | POA: Diagnosis not present

## 2018-11-09 DIAGNOSIS — E782 Mixed hyperlipidemia: Secondary | ICD-10-CM | POA: Diagnosis not present

## 2018-11-26 IMAGING — CT CT CHEST HIGH RESOLUTION W/O CM
2 of 5 series · 15 of 36 positions shown, 18 images · non-contrast
Comparison: Chest CT 06/09/2016.

CLINICAL DATA: 61-year-old female with increasing shortness of
breath and recurrent sinus infections. Evaluate for interstitial
lung disease.

EXAM:
CT CHEST WITHOUT CONTRAST
TECHNIQUE: Multidetector CT imaging of the chest was performed following the
standard protocol without intravenous contrast. High resolution
imaging of the lungs, as well as inspiratory and expiratory imaging,
was performed.

[Series 2: high resolution · axial · 0.70mm/px · z∈[-322,-62]mm · 12 of 144 slices shown, 15 images]
[im 7/144  mediastinal]
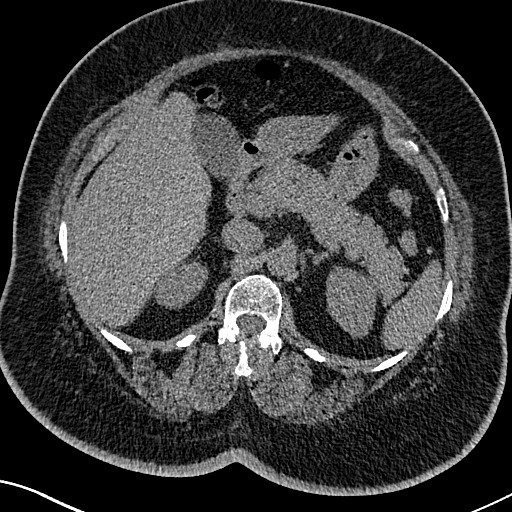
[im 7/144  lung]
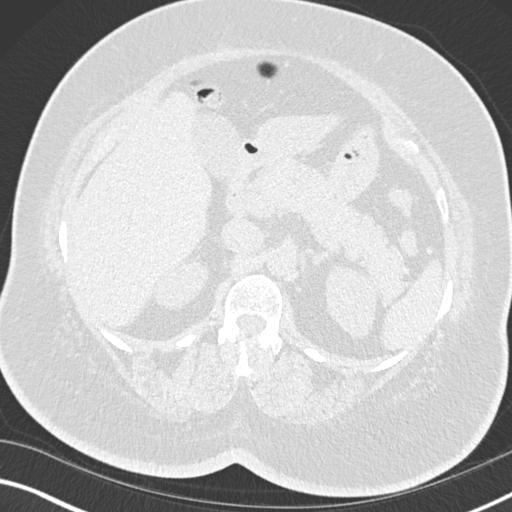
[im 20/144  lung]
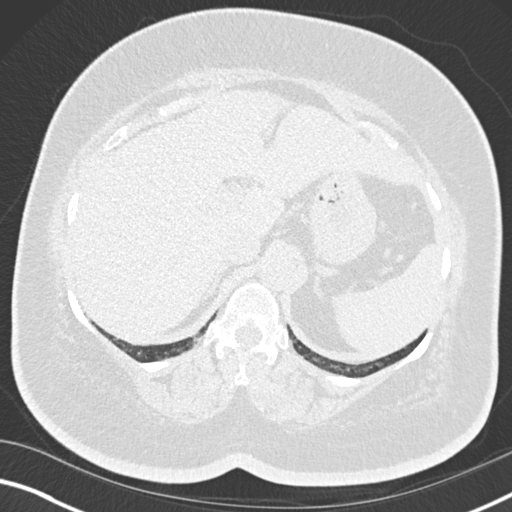
[im 33/144  lung]
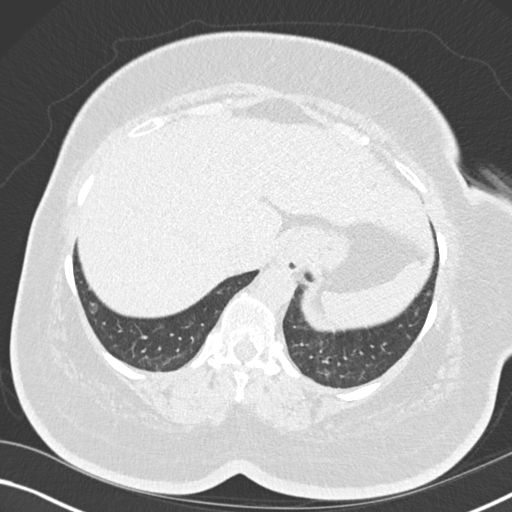
[im 46/144  lung]
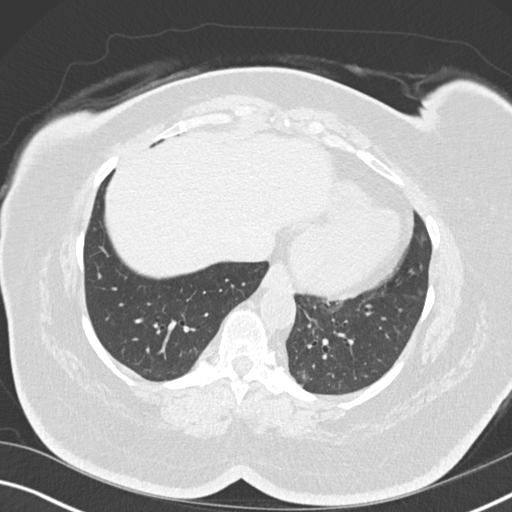
[im 53/144  mediastinal]
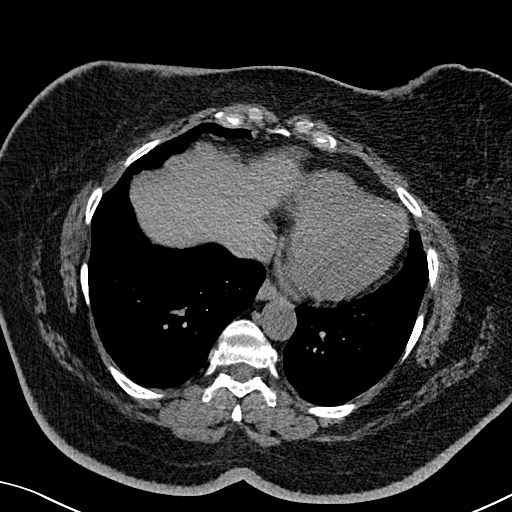
[im 53/144  lung]
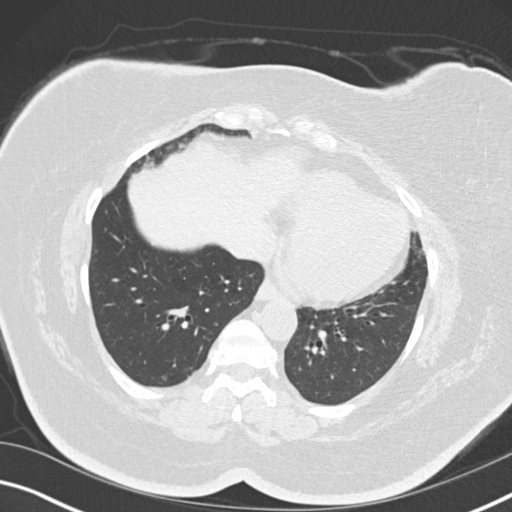
[im 66/144  lung]
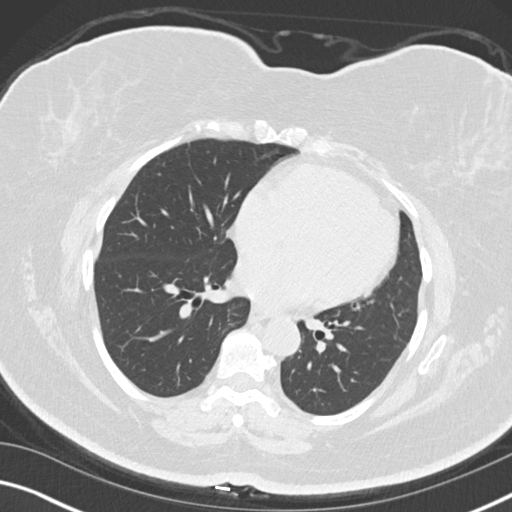
[im 79/144  lung]
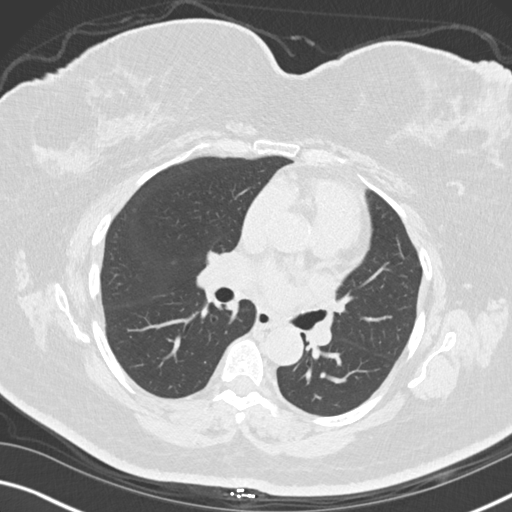
[im 92/144  lung]
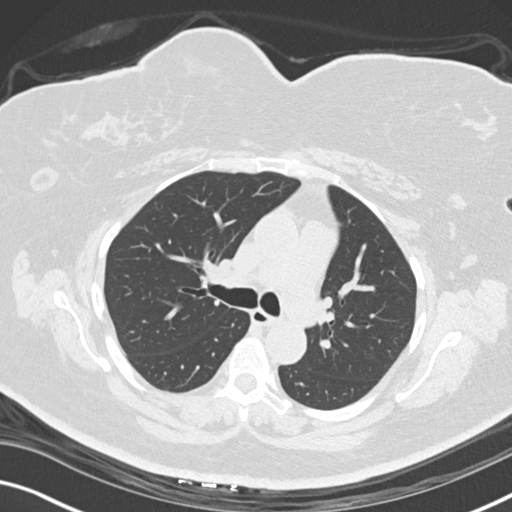
[im 98/144  mediastinal]
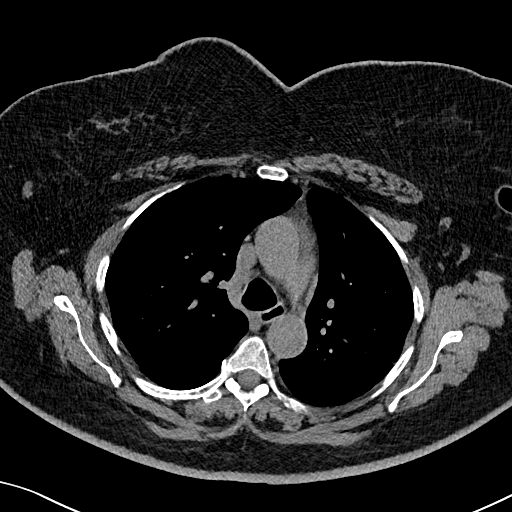
[im 98/144  lung]
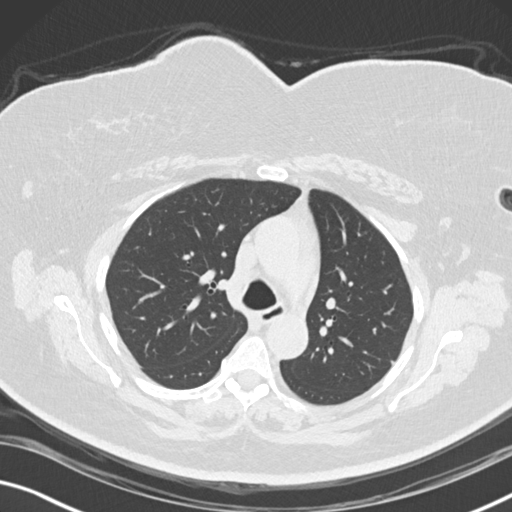
[im 111/144  lung]
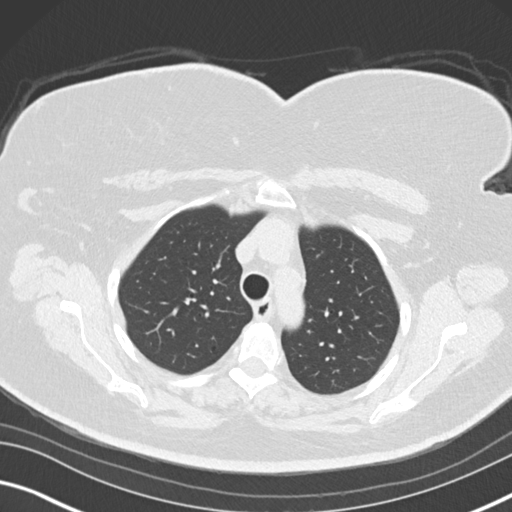
[im 124/144  lung]
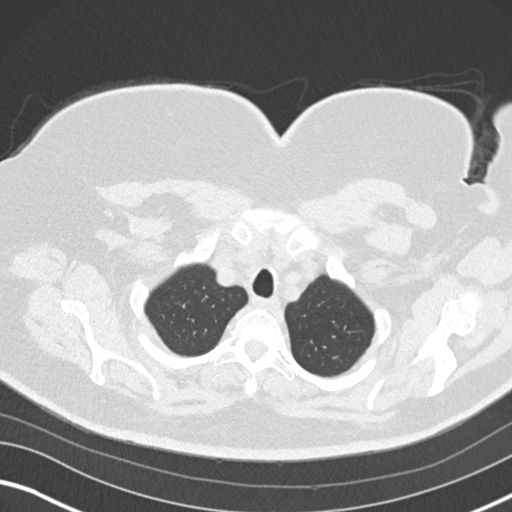
[im 137/144  lung]
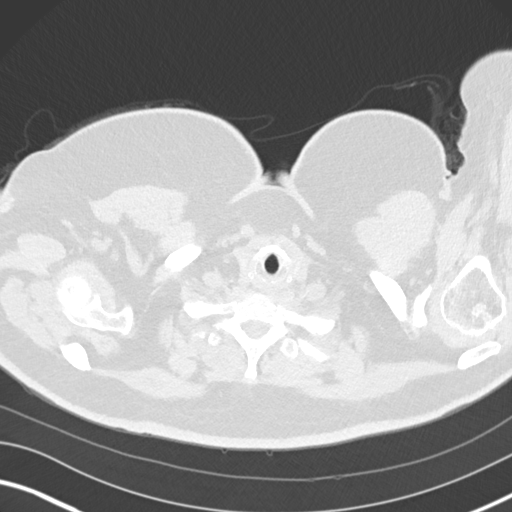

[Series 8: coronal · coronal · 0.59mm/px · 3 of 110 slices shown]
[im 22/110  lung]
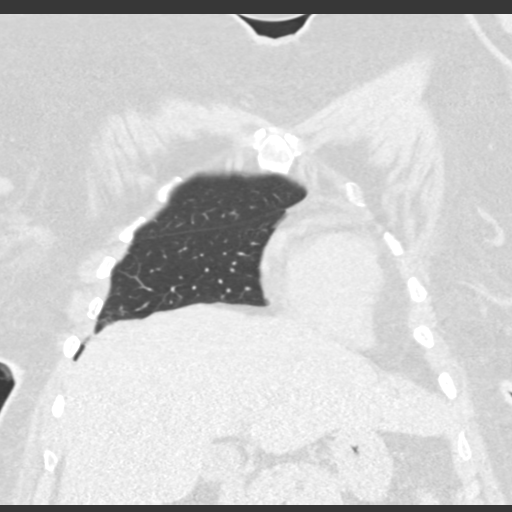
[im 44/110  lung]
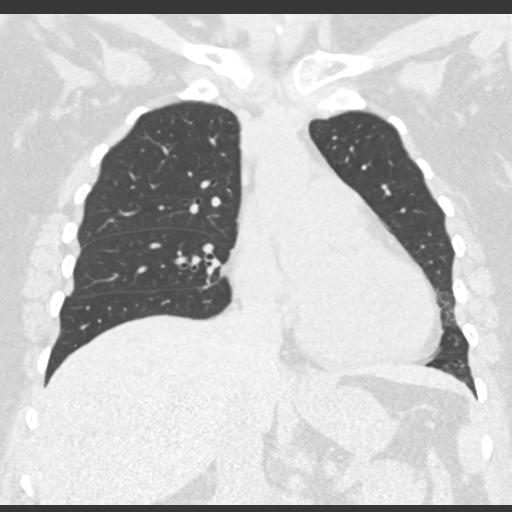
[im 66/110  lung]
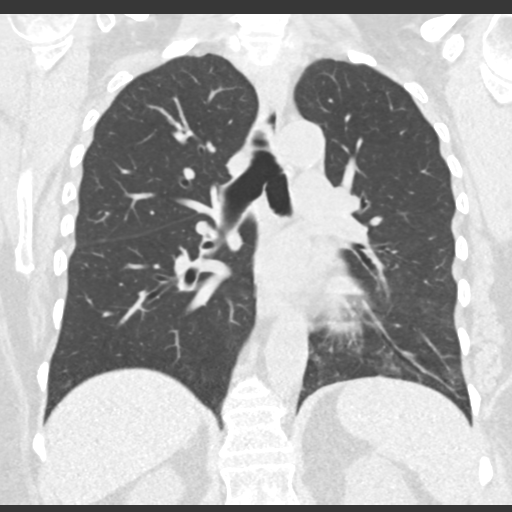

[15 of 36 positions shown; findings below may reference images not displayed]

FINDINGS: Cardiovascular: Heart size is normal. There is no significant
pericardial fluid, thickening or pericardial calcification. There is
aortic atherosclerosis, as well as atherosclerosis of the great
vessels of the mediastinum and the coronary arteries, including
calcified atherosclerotic plaque in the left circumflex coronary
artery.

Mediastinum/Nodes: No pathologically enlarged mediastinal or hilar
lymph nodes. Please note that accurate exclusion of hilar adenopathy
is limited on noncontrast CT scans. Small hiatal hernia. No axillary
lymphadenopathy.

Lungs/Pleura: High-resolution images demonstrates some very mild
areas of patchy peribronchovascular ground-glass attenuation and
mild septal thickening most evident in the basal segments of the
lower lobes of the lungs bilaterally and in the right middle lobe,
where there is also some very mild cylindrical bronchiectasis. No
honeycombing identified. Inspiratory and expiratory imaging
demonstrates moderate air trapping indicative of small airways
disease. There is also severe collapse of the trachea and mainstem
bronchi on expiratory phase imaging, indicative of
tracheobronchomalacia.

Upper Abdomen: Unremarkable.

Musculoskeletal: There are no aggressive appearing lytic or blastic
lesions noted in the visualized portions of the skeleton.
IMPRESSION: 1. There are very subtle changes in the lung bases which are again
suggestive of interstitial lung disease. The CT pattern is
indeterminate for usual interstitial pneumonia (UIP), and given the
stability compared to 6451, and the presence of air trapping, is
rather favored to reflect mild nonspecific interstitial pneumonia
(NSIP).
2. Tracheobronchomalacia.
3. Aortic atherosclerosis, in addition to left circumflex coronary
artery disease. Please note that although the presence of coronary
artery calcium documents the presence of coronary artery disease,
the severity of this disease and any potential stenosis cannot be
assessed on this non-gated CT examination. Assessment for potential
risk factor modification, dietary therapy or pharmacologic therapy
may be warranted, if clinically indicated.

Aortic Atherosclerosis (6R19H-Z37.7).

## 2018-12-06 ENCOUNTER — Other Ambulatory Visit: Payer: Self-pay | Admitting: Internal Medicine

## 2018-12-06 DIAGNOSIS — Z1231 Encounter for screening mammogram for malignant neoplasm of breast: Secondary | ICD-10-CM

## 2018-12-20 ENCOUNTER — Ambulatory Visit: Payer: Medicare HMO | Admitting: Internal Medicine

## 2019-01-12 DIAGNOSIS — W19XXXA Unspecified fall, initial encounter: Secondary | ICD-10-CM | POA: Diagnosis not present

## 2019-01-12 DIAGNOSIS — L03115 Cellulitis of right lower limb: Secondary | ICD-10-CM | POA: Diagnosis not present

## 2019-01-12 DIAGNOSIS — E119 Type 2 diabetes mellitus without complications: Secondary | ICD-10-CM | POA: Diagnosis not present

## 2019-01-12 DIAGNOSIS — S80811A Abrasion, right lower leg, initial encounter: Secondary | ICD-10-CM | POA: Diagnosis not present

## 2019-01-20 ENCOUNTER — Ambulatory Visit: Payer: Medicare HMO

## 2019-01-25 DIAGNOSIS — L03115 Cellulitis of right lower limb: Secondary | ICD-10-CM | POA: Diagnosis not present

## 2019-02-02 DIAGNOSIS — L03115 Cellulitis of right lower limb: Secondary | ICD-10-CM | POA: Diagnosis not present

## 2019-02-17 DIAGNOSIS — H0102A Squamous blepharitis right eye, upper and lower eyelids: Secondary | ICD-10-CM | POA: Diagnosis not present

## 2019-02-17 DIAGNOSIS — E119 Type 2 diabetes mellitus without complications: Secondary | ICD-10-CM | POA: Diagnosis not present

## 2019-02-17 DIAGNOSIS — Z794 Long term (current) use of insulin: Secondary | ICD-10-CM | POA: Diagnosis not present

## 2019-02-17 DIAGNOSIS — H0102B Squamous blepharitis left eye, upper and lower eyelids: Secondary | ICD-10-CM | POA: Diagnosis not present

## 2019-02-17 DIAGNOSIS — H10413 Chronic giant papillary conjunctivitis, bilateral: Secondary | ICD-10-CM | POA: Diagnosis not present

## 2019-02-17 DIAGNOSIS — H40013 Open angle with borderline findings, low risk, bilateral: Secondary | ICD-10-CM | POA: Diagnosis not present

## 2019-02-17 DIAGNOSIS — H2513 Age-related nuclear cataract, bilateral: Secondary | ICD-10-CM | POA: Diagnosis not present

## 2019-02-17 DIAGNOSIS — H43811 Vitreous degeneration, right eye: Secondary | ICD-10-CM | POA: Diagnosis not present

## 2019-03-06 ENCOUNTER — Other Ambulatory Visit: Payer: Self-pay

## 2019-03-06 ENCOUNTER — Ambulatory Visit
Admission: RE | Admit: 2019-03-06 | Discharge: 2019-03-06 | Disposition: A | Discharge status: Home / Self Care | Payer: Medicare HMO | Source: Ambulatory Visit | Attending: Internal Medicine | Admitting: Internal Medicine

## 2019-03-06 DIAGNOSIS — Z1231 Encounter for screening mammogram for malignant neoplasm of breast: Secondary | ICD-10-CM | POA: Diagnosis not present

## 2019-03-10 DIAGNOSIS — G8929 Other chronic pain: Secondary | ICD-10-CM | POA: Diagnosis not present

## 2019-03-10 DIAGNOSIS — M5441 Lumbago with sciatica, right side: Secondary | ICD-10-CM | POA: Diagnosis not present

## 2019-03-29 DIAGNOSIS — R69 Illness, unspecified: Secondary | ICD-10-CM | POA: Diagnosis not present

## 2019-04-03 DIAGNOSIS — K3184 Gastroparesis: Secondary | ICD-10-CM | POA: Diagnosis not present

## 2019-05-04 DIAGNOSIS — J449 Chronic obstructive pulmonary disease, unspecified: Secondary | ICD-10-CM | POA: Diagnosis not present

## 2019-05-04 DIAGNOSIS — J383 Other diseases of vocal cords: Secondary | ICD-10-CM | POA: Diagnosis not present

## 2019-05-04 DIAGNOSIS — J45909 Unspecified asthma, uncomplicated: Secondary | ICD-10-CM | POA: Diagnosis not present

## 2019-05-04 DIAGNOSIS — Z03818 Encounter for observation for suspected exposure to other biological agents ruled out: Secondary | ICD-10-CM | POA: Diagnosis not present

## 2019-05-04 DIAGNOSIS — J309 Allergic rhinitis, unspecified: Secondary | ICD-10-CM | POA: Diagnosis not present

## 2019-05-09 DIAGNOSIS — I10 Essential (primary) hypertension: Secondary | ICD-10-CM | POA: Diagnosis not present

## 2019-05-09 DIAGNOSIS — R69 Illness, unspecified: Secondary | ICD-10-CM | POA: Diagnosis not present

## 2019-05-09 DIAGNOSIS — G4730 Sleep apnea, unspecified: Secondary | ICD-10-CM | POA: Diagnosis not present

## 2019-05-09 DIAGNOSIS — K5900 Constipation, unspecified: Secondary | ICD-10-CM | POA: Diagnosis not present

## 2019-05-09 DIAGNOSIS — J383 Other diseases of vocal cords: Secondary | ICD-10-CM | POA: Diagnosis not present

## 2019-05-09 DIAGNOSIS — K3184 Gastroparesis: Secondary | ICD-10-CM | POA: Diagnosis not present

## 2019-05-09 DIAGNOSIS — E1140 Type 2 diabetes mellitus with diabetic neuropathy, unspecified: Secondary | ICD-10-CM | POA: Diagnosis not present

## 2019-05-09 DIAGNOSIS — J449 Chronic obstructive pulmonary disease, unspecified: Secondary | ICD-10-CM | POA: Diagnosis not present

## 2019-05-09 DIAGNOSIS — E782 Mixed hyperlipidemia: Secondary | ICD-10-CM | POA: Diagnosis not present

## 2019-05-09 DIAGNOSIS — G4700 Insomnia, unspecified: Secondary | ICD-10-CM | POA: Diagnosis not present

## 2019-05-30 ENCOUNTER — Emergency Department (HOSPITAL_COMMUNITY)
Admission: EM | Admit: 2019-05-30 | Discharge: 2019-05-31 | Disposition: A | Discharge status: Home / Self Care | Payer: Medicare HMO | Attending: Emergency Medicine | Admitting: Emergency Medicine

## 2019-05-30 ENCOUNTER — Other Ambulatory Visit: Payer: Self-pay

## 2019-05-30 ENCOUNTER — Emergency Department (HOSPITAL_COMMUNITY): Payer: Medicare HMO

## 2019-05-30 DIAGNOSIS — Z87891 Personal history of nicotine dependence: Secondary | ICD-10-CM | POA: Diagnosis not present

## 2019-05-30 DIAGNOSIS — E119 Type 2 diabetes mellitus without complications: Secondary | ICD-10-CM | POA: Insufficient documentation

## 2019-05-30 DIAGNOSIS — I10 Essential (primary) hypertension: Secondary | ICD-10-CM | POA: Insufficient documentation

## 2019-05-30 DIAGNOSIS — Z79899 Other long term (current) drug therapy: Secondary | ICD-10-CM | POA: Insufficient documentation

## 2019-05-30 DIAGNOSIS — R0602 Shortness of breath: Secondary | ICD-10-CM | POA: Diagnosis not present

## 2019-05-30 DIAGNOSIS — Z20828 Contact with and (suspected) exposure to other viral communicable diseases: Secondary | ICD-10-CM | POA: Insufficient documentation

## 2019-05-30 DIAGNOSIS — J441 Chronic obstructive pulmonary disease with (acute) exacerbation: Secondary | ICD-10-CM

## 2019-05-30 DIAGNOSIS — Z794 Long term (current) use of insulin: Secondary | ICD-10-CM | POA: Insufficient documentation

## 2019-05-30 DIAGNOSIS — R079 Chest pain, unspecified: Secondary | ICD-10-CM | POA: Diagnosis not present

## 2019-05-30 DIAGNOSIS — Z7982 Long term (current) use of aspirin: Secondary | ICD-10-CM | POA: Diagnosis not present

## 2019-05-30 LAB — BASIC METABOLIC PANEL
Anion gap: 12 (ref 5–15)
BUN: 19 mg/dL (ref 8–23)
CO2: 23 mmol/L (ref 22–32)
Calcium: 9.7 mg/dL (ref 8.9–10.3)
Chloride: 104 mmol/L (ref 98–111)
Creatinine, Ser: 1.1 mg/dL — ABNORMAL HIGH (ref 0.44–1.00)
GFR calc Af Amer: >60 mL/min (ref >60)
GFR calc non Af Amer: 53 mL/min — ABNORMAL LOW (ref >60)
Glucose, Bld: 110 mg/dL — ABNORMAL HIGH (ref 70–99)
Potassium: 3.9 mmol/L (ref 3.5–5.1)
Sodium: 139 mmol/L (ref 135–145)

## 2019-05-30 LAB — CBC
HCT: 36.7 % (ref 36.0–46.0)
Hemoglobin: 11.7 g/dL — ABNORMAL LOW (ref 12.0–15.0)
MCH: 27.5 pg (ref 26.0–34.0)
MCHC: 31.9 g/dL (ref 30.0–36.0)
MCV: 86.2 fL (ref 80.0–100.0)
Platelets: 252 10*3/uL (ref 150–400)
RBC: 4.26 MIL/uL (ref 3.87–5.11)
RDW: 14.6 % (ref 11.5–15.5)
WBC: 4.6 10*3/uL (ref 4.0–10.5)
nRBC: 0 % (ref 0.0–0.2)

## 2019-05-30 LAB — TROPONIN I (HIGH SENSITIVITY)
Troponin I (High Sensitivity): 4 ng/L (ref <18)
Troponin I (High Sensitivity): 3 ng/L (ref <18)

## 2019-05-30 MED ORDER — IPRATROPIUM BROMIDE HFA 17 MCG/ACT IN AERS
2.0000 | INHALATION_SPRAY | Freq: Once | RESPIRATORY_TRACT | Status: AC
  Administered 2019-05-30: 2 via RESPIRATORY_TRACT
  Filled 2019-05-30: qty 12.9

## 2019-05-30 MED ORDER — SODIUM CHLORIDE 0.9% FLUSH: 3.0000 mL | Freq: Once | INTRAVENOUS | Status: DC

## 2019-05-30 MED ORDER — PREDNISONE 10 MG PO TABS: 40.0000 mg | ORAL_TABLET | Freq: Every day | ORAL | 0 refills | Status: AC

## 2019-05-30 MED ORDER — ALBUTEROL SULFATE HFA 108 (90 BASE) MCG/ACT IN AERS
6.0000 | INHALATION_SPRAY | Freq: Once | RESPIRATORY_TRACT | Status: AC
  Administered 2019-05-30: 6 via RESPIRATORY_TRACT
  Filled 2019-05-30: qty 6.7

## 2019-05-30 MED ORDER — DEXAMETHASONE SODIUM PHOSPHATE 10 MG/ML IJ SOLN
10.0000 mg | Freq: Once | INTRAMUSCULAR | Status: AC
  Administered 2019-05-30: 10 mg via INTRAMUSCULAR
  Filled 2019-05-30: qty 1

## 2019-05-30 NOTE — ED Provider Notes (Signed)
[facility] EMERGENCY DEPARTMENT Provider Note  CSN: [id number] Arrival date & time: 05/30/19 1506  Chief Complaint(s) Shortness of Breath  HPI [name] is a 63 y.o. female with asthma, chronic bronchitis and COPD who presents to the emergency department with several days of gradually worsening shortness of breath consistent with her chronic lung disease.  Patient reports that she usually gets inhalers and steroid shots with improves her system at her PCP/pulmonologist however due to the pandemic, they are unable to see her at this time.  She denies any known fevers.  She does endorse cough and chest congestion.  She endorses chronic nasal congestion.  No headache.  No nausea or vomiting.  She is endorsing right-sided chest discomfort that is nonexertional and nonradiating.  No sudden onset chest pain.  This is consistent with her asthma/COPD exacerbation from prior.  No abdominal pain.  Denies any other physical complaints.  No known sick contacts or Covid exposure.  HPI  Past Medical History Past Medical History:  Diagnosis Date  . Anxiety   . Arthritis    "both shoulders"  . Asthma   . Blood transfusion without reported diagnosis   . Chronic bronchitis ([place])   . Cold extremity without peripheral vascular disease    bilaterally; "from my knees down into my feet; they get ice cold"  . COPD (chronic obstructive pulmonary disease) ([place])   . Environmental allergies    "year round"  . Exertional dyspnea   . Gastroparesis   . GERD (gastroesophageal reflux disease)   . Headache(784.0)   . Heart murmur   . Hiatal hernia   . High cholesterol   . HTN (hypertension)   . Obesity   . Rotator cuff tear   . Sciatic nerve pain    "tailbone down into my legs when it flares up"  . Sciatica   . Sleep apnea    CPAP, sleep study on [place]  . Type II diabetes mellitus ([place])   . Vocal cord dysfunction    "I've had it for many years"   Patient Active Problem List    Diagnosis Date Noted  . H/O tooth extraction 11/22/2017  . Sinusitis 11/21/2017  . Irritable larynx syndrome 09/18/2015  . ILD (interstitial lung disease) ([place]) 10/22/2014  . Loss of weight 05/08/2014  . Rotator cuff tear 01/29/2012  . Chest pain 08/26/2011  . Allergic rhinitis, cause unspecified 08/08/2011  . Obstructive sleep apnea 08/08/2011  . Chronic cough 02/18/2011  . Dyspnea 02/18/2011  . Diabetes mellitus type 2, insulin dependent ([place]) 05/26/2007  . ANXIETY, SITUATIONAL 05/26/2007  . Essential hypertension 05/26/2007  . Asthma, chronic 05/26/2007  . GERD 05/26/2007  . LIPID DISORDER, HX OF 05/26/2007   Home Medication(s) Prior to Admission medications   Medication Sig Start Date End Date Taking? Authorizing Provider  albuterol (PROVENTIL HFA;VENTOLIN HFA) 108 (90 BASE) MCG/ACT inhaler Inhale 2 puffs into the lungs every 4 (four) hours as needed for wheezing or shortness of breath.     [provider]  ALPRAZolam Duanne Moron) 0.5 MG tablet Take 0.5 mg by mouth 2 (two) times daily as needed for anxiety.     [provider]  aspirin 81 MG tablet Take 81 mg by mouth 2 (two) times a week.     [provider]  atorvastatin (LIPITOR) 20 MG tablet Take 20 mg by mouth every evening.     [provider]  Azelastine-Fluticasone (DYMISTA) 137-50 MCG/ACT SUSP Place 1 spray into both nostrils daily  as needed (for congestion).     [provider]  citalopram (CELEXA) 10 MG tablet Take 20 mg by mouth every evening.  05/08/13   [provider]  fluticasone (FLONASE) 50 MCG/ACT nasal spray Place 2 sprays into both nostrils daily. 11/24/17   Raiford Noble Latif, DO  gabapentin (NEURONTIN) 300 MG capsule Take 300 mg by mouth at bedtime.  05/08/13   [provider]  insulin aspart protamine - aspart (NOVOLOG MIX 70/30 FLEXPEN) (70-30) 100 UNIT/ML FlexPen     [provider]  linaclotide (LINZESS) 290 MCG CAPS capsule Take 1  capsule (290 mcg total) by mouth daily before breakfast. 10/28/17   Pyrtle, Lajuan Lines, MD  loratadine (CLARITIN) 10 MG tablet Take 10 mg by mouth daily as needed for allergies.     [provider]  losartan (COZAAR) 100 MG tablet Take 100 mg by mouth daily.     [provider]  metFORMIN (GLUCOPHAGE) 1000 MG tablet Take 1,000 mg by mouth 2 (two) times daily.  04/15/13   [provider]  metoCLOPramide (REGLAN) 5 MG tablet TAKE 1 TABLET BY MOUTH BEFORE MEALS AND AT BEDTIME AS NEEDED 07/28/18   Pyrtle, Lajuan Lines, MD  oxyCODONE (OXY IR/ROXICODONE) 5 MG immediate release tablet Take 1 tablet (5 mg total) by mouth every 6 (six) hours as needed for severe pain (From Pain not controlled by Tramadol). 11/23/17   Raiford Noble Latif, DO  pantoprazole (PROTONIX) 40 MG tablet Take 1 tablet (40 mg total) by mouth 2 (two) times daily. Patient taking differently: Take 40 mg by mouth daily.  06/17/15   Hvozdovic, Lori P, PA-C  predniSONE (DELTASONE) 10 MG tablet Take 4 tablets (40 mg total) by mouth daily for 4 days. 05/30/19 06/03/19  Fatima Blank, MD  traMADol (ULTRAM) 50 MG tablet Take 50 mg by mouth every 6 (six) hours as needed for pain.     [provider]  VICTOZA 18 MG/3ML SOPN Inject 18 mg into the skin every morning.  03/14/13   [provider]  zolpidem (AMBIEN) 5 MG tablet Take 5 mg by mouth at bedtime. 11/09/17   [provider]                                                                                                                                    Past Surgical History Past Surgical History:  Procedure Laterality Date  . ABDOMINAL ADHESION SURGERY  ~ 1978  . APPENDECTOMY  1976  . NASAL SINUS SURGERY  1992  . OOPHORECTOMY  1976   w/ovarian cyst excision and appy  . SHOULDER ARTHROSCOPY W/ ROTATOR CUFF REPAIR  01/28/12   left  . TOE SURGERY     for ingrown [id number] ?left  . TOTAL ABDOMINAL HYSTERECTOMY  1985   Family History Family  History  Problem Relation Age of Onset  . Heart disease Mother   . Asthma Mother   .  Heart failure Maternal Grandmother   . Asthma Son   . Asthma Daughter   . Lupus Daughter   . Diabetes Other        "everybody on both sides"  . Colon cancer Neg Hx   . Throat cancer Neg Hx   . Stomach cancer Neg Hx   . Esophageal cancer Neg Hx   . Rectal cancer Neg Hx     Social History Social History   Tobacco Use  . Smoking status: Former Smoker    Packs/day: 1.00    Years: 25.00    Pack years: 25.00    Types: Cigarettes    Quit date: 07/13/1996    Years since quitting: 22.8  . Smokeless tobacco: Never Used  Substance Use Topics  . Alcohol use: No    Comment: 01/29/12 "used to abuse alcohol; last drink was in the 1980's"  . Drug use: No   Allergies Lisinopril  Review of Systems Review of Systems All other systems are reviewed and are negative for acute change except as noted in the HPI  Physical Exam Vital Signs  I have reviewed the triage vital signs BP 136/68 (BP Location: Left Arm)   Pulse 72   Temp 99 F (37.2 C) (Oral)   Resp 14   SpO2 100%   Physical Exam Vitals signs reviewed.  Constitutional:      General: She is not in acute distress.    Appearance: She is well-developed. She is not diaphoretic.  HENT:     Head: Normocephalic and atraumatic.     Nose: Nose normal.  Eyes:     General: No scleral icterus.       Right eye: No discharge.        Left eye: No discharge.     Conjunctiva/sclera: Conjunctivae normal.     Pupils: Pupils are equal, round, and reactive to light.  Neck:     Musculoskeletal: Normal range of motion and neck supple.  Cardiovascular:     Rate and Rhythm: Normal rate and regular rhythm.     Heart sounds: No murmur. No friction rub. No gallop.   Pulmonary:     Effort: Pulmonary effort is normal. Tachypnea present. No respiratory distress.     Breath sounds: Normal breath sounds. Decreased air movement present. No stridor. No rales.    Abdominal:     General: There is no distension.     Palpations: Abdomen is soft.     Tenderness: There is no abdominal tenderness.  Musculoskeletal:        General: No tenderness.  Skin:    General: Skin is warm and dry.     Findings: No erythema or rash.  Neurological:     Mental Status: She is alert and oriented to person, place, and time.     ED Results and Treatments Labs (all labs ordered are listed, but only abnormal results are displayed) Labs Reviewed  BASIC METABOLIC PANEL - Abnormal; Notable for the following components:      Result Value   Glucose, Bld 110 (*)    Creatinine, Ser 1.10 (*)    GFR calc non Af Amer 53 (*)    All other components within normal limits  CBC - Abnormal; Notable for the following components:   Hemoglobin 11.7 (*)    All other components within normal limits  SARS CORONAVIRUS 2 (TAT 6-24 HRS)  TROPONIN I (HIGH SENSITIVITY)  TROPONIN I (HIGH SENSITIVITY)  EKG  EKG Interpretation  Date/Time:  Tuesday May 30 2019 15:17:05 EST Ventricular Rate:  97 PR Interval:  138 QRS Duration: 74 QT Interval:  328 QTC Calculation: 416 R Axis:   97 Text Interpretation: Normal sinus rhythm Rightward axis Nonspecific T wave abnormality Abnormal ECG No significant change since last tracing Confirmed by Addison Lank [phone]) on 05/30/2019 11:10:33 PM      Radiology Dg Chest 2 View  Result Date: 05/30/2019 CLINICAL DATA:  Shortness of breath, chest pain. EXAM: CHEST - 2 VIEW COMPARISON:  Chest radiograph 08/25/2018 FINDINGS: Heart size within normal limits. There is no airspace consolidation within the lungs. No evidence of pleural effusion or pneumothorax. No acute bony abnormality. IMPRESSION: No airspace consolidation. Electronically Signed   By: Kellie Simmering DO   On: 05/30/2019 15:58    Pertinent labs & imaging results that  were available during my care of the patient were reviewed by me and considered in my medical decision making (see chart for details).  Medications Ordered in ED Medications  sodium chloride flush (NS) 0.9 % injection 3 mL (has no administration in time range)  ipratropium (ATROVENT HFA) inhaler 2 puff (2 puffs Inhalation Given 05/30/19 2351)  albuterol (VENTOLIN HFA) 108 (90 Base) MCG/ACT inhaler 6 puff (6 puffs Inhalation Given 05/30/19 2326)  dexamethasone (DECADRON) injection 10 mg (10 mg Intramuscular Given 05/30/19 2327)                                                                                                                                    Procedures Procedures  (including critical care time)  Medical Decision Making / ED Course I have reviewed the nursing notes for this encounter and the patient's prior records (if available in EHR or on provided paperwork).   Davanna Carmenlita Mallory was evaluated in Emergency Department on 05/31/2019 for the symptoms described in the history of present illness. She was evaluated in the context of the global COVID-19 pandemic, which necessitated consideration that the patient might be at risk for infection with the SARS-CoV-2 virus that causes COVID-19. Institutional protocols and algorithms that pertain to the evaluation of patients at risk for COVID-19 are in a state of rapid change based on information released by regulatory bodies including the CDC and federal and state organizations. These policies and algorithms were followed during the patient's care in the ED.  Patient noted to have low-grade fever.  Rest of the labs are grossly reassuring with normal sats on room air.  Mild decreased air movement throughout.  No wheezing noted.  No respiratory distress currently.  Feel this is consistent with asthma/COPD exacerbation.  Possibly secondary to viral process versus atypical pneumonia.  Chest x-ray without evidence of pneumonia.  Screening  labs grossly reassuring without leukocytosis or significant anemia.  No electrolyte derangements or renal insufficiency. EKG nonischemic without acute changes.  Serial troponins negative.  Doubt cardiac etiology. Low suspicion for pulmonary  embolism. No evidence of volume overload on exam concerning for heart failure.  Treated with breathing treatments and IM steroids.  On reassessment patient appears to be improved with improved work of breathing. Feel she is stable for outpatient management.  Safe for discharge with strict return precautions.  Given low-grade temp, will test for Covid.  Results will be called back to the patient if positive.  The patient appears reasonably screened and/or stabilized for discharge and I doubt any other medical condition or other [facility] requiring further screening, evaluation, or treatment in the ED at this time prior to discharge.  The patient is safe for discharge with strict return precautions.       Final Clinical Impression(s) / ED Diagnoses Final diagnoses:  COPD exacerbation ([place])     The patient appears reasonably screened and/or stabilized for discharge and I doubt any other medical condition or other [facility] requiring further screening, evaluation, or treatment in the ED at this time prior to discharge.  Disposition: Discharge  Condition: Good  I have discussed the results, Dx and Tx plan with the patient who expressed understanding and agree(s) with the plan. Discharge instructions discussed at great length. The patient was given strict return precautions who verbalized understanding of the instructions. No further questions at time of discharge.    ED Discharge Orders         Ordered    predniSONE (DELTASONE) 10 MG tablet  Daily     05/30/19 2328           Follow Up: Wenda Low, MD [place] [employer] [address] [place] [address] [phone]  Schedule an appointment as soon as possible for a visit  in 3-5 days, If  symptoms do not improve or  worsen     This chart was dictated using voice recognition software.  Despite best efforts to proofread,  errors can occur which can change the documentation meaning.   Fatima Blank, MD 05/31/19 0020

## 2019-05-30 NOTE — ED Triage Notes (Signed)
Pt here for worsening shob with exertion d/t chronic bronchitis. Pt trying to get in with her PCP but sts they will not see her in person. Endorses right sided chest pain.

## 2019-05-31 LAB — SARS CORONAVIRUS 2 (TAT 6-24 HRS): SARS Coronavirus 2: NEGATIVE

## 2019-05-31 NOTE — ED Notes (Signed)
Patient verbalizes understanding of discharge instructions. Opportunity for questioning and answers were provided. Armband removed by staff, pt discharged from ED.  

## 2019-06-30 ENCOUNTER — Ambulatory Visit
Admission: RE | Admit: 2019-06-30 | Discharge: 2019-06-30 | Disposition: A | Discharge status: Home / Self Care | Payer: Medicare HMO | Source: Ambulatory Visit | Attending: Internal Medicine | Admitting: Internal Medicine

## 2019-06-30 ENCOUNTER — Other Ambulatory Visit: Payer: Self-pay | Admitting: Internal Medicine

## 2019-06-30 DIAGNOSIS — M79671 Pain in right foot: Secondary | ICD-10-CM | POA: Diagnosis not present

## 2019-06-30 DIAGNOSIS — M25571 Pain in right ankle and joints of right foot: Secondary | ICD-10-CM

## 2019-06-30 DIAGNOSIS — S99911A Unspecified injury of right ankle, initial encounter: Secondary | ICD-10-CM | POA: Diagnosis not present

## 2019-06-30 DIAGNOSIS — S99921A Unspecified injury of right foot, initial encounter: Secondary | ICD-10-CM | POA: Diagnosis not present

## 2019-06-30 DIAGNOSIS — S93401A Sprain of unspecified ligament of right ankle, initial encounter: Secondary | ICD-10-CM | POA: Diagnosis not present

## 2019-06-30 DIAGNOSIS — M7989 Other specified soft tissue disorders: Secondary | ICD-10-CM | POA: Diagnosis not present

## 2019-10-29 ENCOUNTER — Emergency Department (HOSPITAL_COMMUNITY)
Admission: EM | Admit: 2019-10-29 | Discharge: 2019-10-29 | Disposition: A | Discharge status: Home / Self Care | Payer: Medicare HMO | Attending: Emergency Medicine | Admitting: Emergency Medicine

## 2019-10-29 ENCOUNTER — Encounter (HOSPITAL_COMMUNITY): Payer: Self-pay | Admitting: Emergency Medicine

## 2019-10-29 ENCOUNTER — Other Ambulatory Visit: Payer: Self-pay

## 2019-10-29 ENCOUNTER — Emergency Department (HOSPITAL_COMMUNITY): Payer: Medicare HMO

## 2019-10-29 DIAGNOSIS — Z79899 Other long term (current) drug therapy: Secondary | ICD-10-CM | POA: Diagnosis not present

## 2019-10-29 DIAGNOSIS — J441 Chronic obstructive pulmonary disease with (acute) exacerbation: Secondary | ICD-10-CM | POA: Diagnosis not present

## 2019-10-29 DIAGNOSIS — Z87891 Personal history of nicotine dependence: Secondary | ICD-10-CM | POA: Insufficient documentation

## 2019-10-29 DIAGNOSIS — I10 Essential (primary) hypertension: Secondary | ICD-10-CM | POA: Diagnosis not present

## 2019-10-29 DIAGNOSIS — R0602 Shortness of breath: Secondary | ICD-10-CM

## 2019-10-29 DIAGNOSIS — Z7982 Long term (current) use of aspirin: Secondary | ICD-10-CM | POA: Insufficient documentation

## 2019-10-29 DIAGNOSIS — Z7984 Long term (current) use of oral hypoglycemic drugs: Secondary | ICD-10-CM | POA: Diagnosis not present

## 2019-10-29 DIAGNOSIS — E119 Type 2 diabetes mellitus without complications: Secondary | ICD-10-CM | POA: Diagnosis not present

## 2019-10-29 DIAGNOSIS — R079 Chest pain, unspecified: Secondary | ICD-10-CM | POA: Diagnosis not present

## 2019-10-29 LAB — CBC
HCT: 38.9 % (ref 36.0–46.0)
Hemoglobin: 12 g/dL (ref 12.0–15.0)
MCH: 26.7 pg (ref 26.0–34.0)
MCHC: 30.8 g/dL (ref 30.0–36.0)
MCV: 86.4 fL (ref 80.0–100.0)
Platelets: 224 10*3/uL (ref 150–400)
RBC: 4.5 MIL/uL (ref 3.87–5.11)
RDW: 13.7 % (ref 11.5–15.5)
WBC: 6.5 10*3/uL (ref 4.0–10.5)
nRBC: 0 % (ref 0.0–0.2)

## 2019-10-29 LAB — BASIC METABOLIC PANEL
Anion gap: 12 (ref 5–15)
BUN: 8 mg/dL (ref 8–23)
CO2: 26 mmol/L (ref 22–32)
Calcium: 9.4 mg/dL (ref 8.9–10.3)
Chloride: 100 mmol/L (ref 98–111)
Creatinine, Ser: 0.98 mg/dL (ref 0.44–1.00)
GFR calc Af Amer: >60 mL/min (ref >60)
GFR calc non Af Amer: >60 mL/min (ref >60)
Glucose, Bld: 107 mg/dL — ABNORMAL HIGH (ref 70–99)
Potassium: 4 mmol/L (ref 3.5–5.1)
Sodium: 138 mmol/L (ref 135–145)

## 2019-10-29 LAB — TROPONIN I (HIGH SENSITIVITY)
Troponin I (High Sensitivity): 3 ng/L (ref <18)
Troponin I (High Sensitivity): 3 ng/L (ref <18)

## 2019-10-29 MED ORDER — PREDNISONE 50 MG PO TABS: 50.0000 mg | ORAL_TABLET | Freq: Every day | ORAL | 0 refills | Status: DC

## 2019-10-29 MED ORDER — METHYLPREDNISOLONE SODIUM SUCC 125 MG IJ SOLR
125.0000 mg | Freq: Once | INTRAMUSCULAR | Status: AC
  Administered 2019-10-29: 125 mg via INTRAVENOUS
  Filled 2019-10-29: qty 2

## 2019-10-29 MED ORDER — ALBUTEROL SULFATE HFA 108 (90 BASE) MCG/ACT IN AERS
2.0000 | INHALATION_SPRAY | Freq: Once | RESPIRATORY_TRACT | Status: AC
  Administered 2019-10-29: 2 via RESPIRATORY_TRACT
  Filled 2019-10-29: qty 6.7

## 2019-10-29 MED ORDER — SODIUM CHLORIDE 0.9% FLUSH: 3.0000 mL | Freq: Once | INTRAVENOUS | Status: DC

## 2019-10-29 MED ORDER — IPRATROPIUM BROMIDE HFA 17 MCG/ACT IN AERS
2.0000 | INHALATION_SPRAY | Freq: Once | RESPIRATORY_TRACT | Status: AC
  Administered 2019-10-29: 2 via RESPIRATORY_TRACT
  Filled 2019-10-29: qty 12.9

## 2019-10-29 NOTE — ED Notes (Signed)
Ambulated pt with O2 monitor. O2 sat stayed above 96 the entire time. Pt reports that her breathing feels better after the breathing treatments and her pain is "much better."

## 2019-10-29 NOTE — ED Provider Notes (Signed)
[facility] EMERGENCY DEPARTMENT Provider Note   CSN: [id number] Arrival date & time: 10/29/19  1520     History Chief Complaint  Patient presents with  . Chest Pain    [name] is a 64 y.o. female.  The history is provided by the patient and medical records. No language interpreter was used.  Shortness of Breath Severity:  Moderate Onset quality:  Gradual Duration:  3 days Timing:  Constant Progression:  Waxing and waning Chronicity:  Recurrent Context: pollens and weather changes   Relieved by:  Nothing Worsened by:  Coughing Ineffective treatments:  Inhaler Associated symptoms: cough and wheezing   Associated symptoms: no abdominal pain, no chest pain, no claudication, no diaphoresis, no fever, no headaches, no hemoptysis, no neck pain, no rash, no sputum production and no vomiting   Risk factors: obesity   Risk factors: no hx of PE/DVT        Past Medical History:  Diagnosis Date  . Anxiety   . Arthritis    "both shoulders"  . Asthma   . Blood transfusion without reported diagnosis   . Chronic bronchitis ([place])   . Cold extremity without peripheral vascular disease    bilaterally; "from my knees down into my feet; they get ice cold"  . COPD (chronic obstructive pulmonary disease) ([place])   . Environmental allergies    "year round"  . Exertional dyspnea   . Gastroparesis   . GERD (gastroesophageal reflux disease)   . Headache(784.0)   . Heart murmur   . Hiatal hernia   . High cholesterol   . HTN (hypertension)   . Obesity   . Rotator cuff tear   . Sciatic nerve pain    "tailbone down into my legs when it flares up"  . Sciatica   . Sleep apnea    CPAP, sleep study on [facility]  . Type II diabetes mellitus ([place])   . Vocal cord dysfunction    "I've had it for many years"    Patient Active Problem List   Diagnosis Date Noted  . H/O tooth extraction 11/22/2017  . Sinusitis 11/21/2017  . Irritable larynx syndrome 09/18/2015    . ILD (interstitial lung disease) ([place]) 10/22/2014  . Loss of weight 05/08/2014  . Rotator cuff tear 01/29/2012  . Chest pain 08/26/2011  . Allergic rhinitis, cause unspecified 08/08/2011  . Obstructive sleep apnea 08/08/2011  . Chronic cough 02/18/2011  . Dyspnea 02/18/2011  . Diabetes mellitus type 2, insulin dependent ([place]) 05/26/2007  . ANXIETY, SITUATIONAL 05/26/2007  . Essential hypertension 05/26/2007  . Asthma, chronic 05/26/2007  . GERD 05/26/2007  . LIPID DISORDER, HX OF 05/26/2007    Past Surgical History:  Procedure Laterality Date  . ABDOMINAL ADHESION SURGERY  ~ 1978  . APPENDECTOMY  1976  . NASAL SINUS SURGERY  1992  . OOPHORECTOMY  1976   w/ovarian cyst excision and appy  . SHOULDER ARTHROSCOPY W/ ROTATOR CUFF REPAIR  01/28/12   left  . TOE SURGERY     for ingrown [id number] ?left  . TOTAL ABDOMINAL HYSTERECTOMY  1985     OB History   No obstetric history on file.     Family History  Problem Relation Age of Onset  . Heart disease Mother   . Asthma Mother   . Heart failure Maternal Grandmother   . Asthma Son   . Asthma Daughter   . Lupus Daughter   . Diabetes Other        "  everybody on both sides"  . Colon cancer Neg Hx   . Throat cancer Neg Hx   . Stomach cancer Neg Hx   . Esophageal cancer Neg Hx   . Rectal cancer Neg Hx     Social History   Tobacco Use  . Smoking status: Former Smoker    Packs/day: 1.00    Years: 25.00    Pack years: 25.00    Types: Cigarettes    Quit date: 07/13/1996    Years since quitting: 23.3  . Smokeless tobacco: Never Used  Substance Use Topics  . Alcohol use: No    Comment: 01/29/12 "used to abuse alcohol; last drink was in the 1980's"  . Drug use: No    Home Medications Prior to Admission medications   Medication Sig Start Date End Date Taking? Authorizing Provider  albuterol (PROVENTIL HFA;VENTOLIN HFA) 108 (90 BASE) MCG/ACT inhaler Inhale 2 puffs into the lungs every 4 (four) hours as needed for  wheezing or shortness of breath.     [provider]  ALPRAZolam Duanne Moron) 0.5 MG tablet Take 0.5 mg by mouth 2 (two) times daily as needed for anxiety.     [provider]  aspirin 81 MG tablet Take 81 mg by mouth 2 (two) times a week.     [provider]  atorvastatin (LIPITOR) 20 MG tablet Take 20 mg by mouth every evening.     [provider]  Azelastine-Fluticasone (DYMISTA) 137-50 MCG/ACT SUSP Place 1 spray into both nostrils daily as needed (for congestion).     [provider]  citalopram (CELEXA) 10 MG tablet Take 20 mg by mouth every evening.  05/08/13   [provider]  fluticasone (FLONASE) 50 MCG/ACT nasal spray Place 2 sprays into both nostrils daily. 11/24/17   Raiford Noble Latif, DO  gabapentin (NEURONTIN) 300 MG capsule Take 300 mg by mouth at bedtime.  05/08/13   [provider]  insulin aspart protamine - aspart (NOVOLOG MIX 70/30 FLEXPEN) (70-30) 100 UNIT/ML FlexPen     [provider]  linaclotide (LINZESS) 290 MCG CAPS capsule Take 1 capsule (290 mcg total) by mouth daily before breakfast. 10/28/17   Pyrtle, Lajuan Lines, MD  loratadine (CLARITIN) 10 MG tablet Take 10 mg by mouth daily as needed for allergies.     [provider]  losartan (COZAAR) 100 MG tablet Take 100 mg by mouth daily.     [provider]  metFORMIN (GLUCOPHAGE) 1000 MG tablet Take 1,000 mg by mouth 2 (two) times daily.  04/15/13   [provider]  metoCLOPramide (REGLAN) 5 MG tablet TAKE 1 TABLET BY MOUTH BEFORE MEALS AND AT BEDTIME AS NEEDED 07/28/18   Pyrtle, Lajuan Lines, MD  oxyCODONE (OXY IR/ROXICODONE) 5 MG immediate release tablet Take 1 tablet (5 mg total) by mouth every 6 (six) hours as needed for severe pain (From Pain not controlled by Tramadol). 11/23/17   Raiford Noble Latif, DO  pantoprazole (PROTONIX) 40 MG tablet Take 1 tablet (40 mg total) by mouth 2 (two) times daily. Patient taking differently: Take 40 mg by  mouth daily.  06/17/15   Hvozdovic, Lori P, PA-C  traMADol (ULTRAM) 50 MG tablet Take 50 mg by mouth every 6 (six) hours as needed for pain.     [provider]  VICTOZA 18 MG/3ML SOPN Inject 18 mg into the skin every morning.  03/14/13   [provider]  zolpidem (AMBIEN) 5 MG tablet Take 5 mg by mouth at  bedtime. 11/09/17   [provider]    Allergies    Lisinopril  Review of Systems   Review of Systems  Constitutional: Negative for chills, diaphoresis, fatigue and fever.  HENT: Positive for congestion.   Eyes: Negative for visual disturbance.  Respiratory: Positive for cough, chest tightness, shortness of breath and wheezing. Negative for hemoptysis, sputum production and stridor.   Cardiovascular: Negative for chest pain, palpitations, claudication and leg swelling.  Gastrointestinal: Negative for abdominal pain, constipation, diarrhea, nausea and vomiting.  Genitourinary: Negative for dysuria.  Musculoskeletal: Negative for back pain, neck pain and neck stiffness.  Skin: Negative for rash and wound.  Neurological: Negative for dizziness, light-headedness and headaches.  Psychiatric/Behavioral: Negative for agitation.  All other systems reviewed and are negative.   Physical Exam Updated Vital Signs BP (!) 171/62 (BP Location: Left Arm)   Pulse 73   Temp 99.1 F (37.3 C) (Oral)   Resp 18   Ht '5\' 6"'$  (1.676 m)   Wt 96 kg   SpO2 98%   BMI 34.16 kg/m   Physical Exam Vitals and nursing note reviewed.  Constitutional:      General: She is not in acute distress.    Appearance: She is well-developed. She is not ill-appearing, toxic-appearing or diaphoretic.  HENT:     Head: Normocephalic and atraumatic.     Right Ear: External ear normal.     Left Ear: External ear normal.     Nose: Nose normal.     Mouth/Throat:     Pharynx: No oropharyngeal exudate.  Eyes:     Conjunctiva/sclera: Conjunctivae normal.     Pupils: Pupils are equal, round, and  reactive to light.  Cardiovascular:     Rate and Rhythm: Normal rate.     Heart sounds: Normal heart sounds.  Pulmonary:     Effort: No tachypnea or respiratory distress.     Breath sounds: No stridor. Wheezing present. No decreased breath sounds, rhonchi or rales.  Abdominal:     General: There is no distension.     Tenderness: There is no abdominal tenderness. There is no rebound.  Musculoskeletal:     Cervical back: Normal range of motion and neck supple.     Right lower leg: No tenderness. No edema.     Left lower leg: No tenderness. No edema.  Skin:    General: Skin is warm.     Coloration: Skin is not pale.     Findings: No erythema or rash.  Neurological:     General: No focal deficit present.     Mental Status: She is alert and oriented to person, place, and time.     Motor: No abnormal muscle tone.     Coordination: Coordination normal.     Deep Tendon Reflexes: Reflexes are normal and symmetric.  Psychiatric:        Mood and Affect: Mood normal.     ED Results / Procedures / Treatments   Labs (all labs ordered are listed, but only abnormal results are displayed) Labs Reviewed  BASIC METABOLIC PANEL - Abnormal; Notable for the following components:      Result Value   Glucose, Bld 107 (*)    All other components within normal limits  CBC  TROPONIN I (HIGH SENSITIVITY)  TROPONIN I (HIGH SENSITIVITY)    EKG EKG Interpretation  Date/Time:  Sunday October 29 2019 15:36:14 EDT Ventricular Rate:  74 PR Interval:  146 QRS Duration: 78 QT Interval:  290 QTC Calculation:  321 R Axis:   88 Text Interpretation: Normal sinus rhythm Nonspecific ST and T wave abnormality Abnormal ECG When compared to prior, t wave inversion in lead 3 has improved. No STEMI Confirmed by Antony Blackbird [phone]) on 10/29/2019 6:26:38 PM   Radiology DG Chest 2 View  Result Date: 10/29/2019 CLINICAL DATA:  64 year old female with chest pain. EXAM: CHEST - 2 VIEW COMPARISON:  Chest  radiograph dated 05/30/2019. FINDINGS: No focal consolidation, pleural effusion, pneumothorax. The cardiac silhouette is within normal limits. No acute osseous pathology. IMPRESSION: No active cardiopulmonary disease. Electronically Signed   By: Anner Crete M.D.   On: 10/29/2019 16:03    Procedures Procedures (including critical care time)  Medications Ordered in ED Medications  sodium chloride flush (NS) 0.9 % injection 3 mL (has no administration in time range)  methylPREDNISolone sodium succinate (SOLU-MEDROL) 125 mg/2 mL injection 125 mg (125 mg Intravenous Given 10/29/19 1956)  albuterol (VENTOLIN HFA) 108 (90 Base) MCG/ACT inhaler 2 puff (2 puffs Inhalation Given 10/29/19 2001)  ipratropium (ATROVENT HFA) inhaler 2 puff (2 puffs Inhalation Given 10/29/19 2003)    ED Course  I have reviewed the triage vital signs and the nursing notes.  Pertinent labs & imaging results that were available during my care of the patient were reviewed by me and considered in my medical decision making (see chart for details).    MDM Rules/Calculators/A&P                      Marla Maziyah Pfiester is a 64 y.o. female with a past medical history significant for asthma, GERD, hypertension, anxiety, allergies, and sleep apnea who presents with shortness of breath and wheezing.  Patient reports that for the last few days, she had run out of her inhalers and is having worsened breathing with wheezing.  She says that the temperature changes and the pollens have been exacerbating things.  She does report a cough but reports no production.  She denies fevers, chills, or chest pain.  She denies palpitations.  She denies edema.  No recent leg pain or leg swelling or history of DVT/PE.  She thinks this is her COPD/asthma flaring up and she think she needs steroids.  On exam, lungs have some wheezing but she is maintaining oxygen saturation on room air.  Abdomen and chest nontender.  Good pulses in extremities.  No  lower extremity edema seen.  EKG shows no STEMI.  Patient was given albuterol, Atrovent, and steroids.  Patient began breathing much better.  Her work-up was overall reassuring in regards to EKG, chest x-ray, and labs.  Patient was able to ambulate without hypoxia after the breathing treatments and steroids.  She would like to go home and feels much better.  Patient given a burst of steroids for likely reactive airway disease exacerbation with asthma in the setting of the pollens.  Patient was offered Covid test but she did not want this.  She will follow up with PCP and understands return precautions.  Low suspicion for DVT or PE given lack of any chest pain, palpitations, leg pain, or leg swelling.  Patient agrees with plan of care and was discharged in good condition breathing much better.    Final Clinical Impression(s) / ED Diagnoses Final diagnoses:  Shortness of breath  COPD exacerbation ([place])    Rx / DC Orders ED Discharge Orders         Ordered    predniSONE (DELTASONE) 50 MG tablet  Daily  10/29/19 2302          Clinical Impression: 1. Shortness of breath   2. COPD exacerbation ([place])     Disposition: Discharge  Condition: Good  I have discussed the results, Dx and Tx plan with the pt(& family if present). He/she/they expressed understanding and agree(s) with the plan. Discharge instructions discussed at great length. Strict return precautions discussed and pt &/or family have verbalized understanding of the instructions. No further questions at time of discharge.    Discharge Medication List as of 10/29/2019 11:03 PM    START taking these medications   Details  predniSONE (DELTASONE) 50 MG tablet Take 1 tablet (50 mg total) by mouth daily., Starting Mon 10/30/2019, Print        Follow Up: Wenda Low, MD [address] [place] [address] [place] [address] [id number] mc [place]       Lanee Chain, Gwenyth Allegra, MD 10/30/19 [phone]

## 2019-10-29 NOTE — ED Triage Notes (Signed)
Pt reports chest pain x 2 days with SOB.  Reports hx of chronic bronchitis.

## 2019-10-29 NOTE — Discharge Instructions (Signed)
Your history and exam today are consistent with exacerbation of your wheezing and COPD.  The x-ray did not show pneumonia and your labs were otherwise reassuring.  As your oxygen was normal when you walked around and your breathing so much better after the steroids and medications, we feel you are safe for discharge home.  Please use the steroids for the next 4 days starting tomorrow as you already received today's dose.  Please use the inhalers.  Please follow with your primary doctor in the next several days.  If any symptoms change or worsen, please return to nearest emergency department.

## 2019-11-08 DIAGNOSIS — J310 Chronic rhinitis: Secondary | ICD-10-CM | POA: Diagnosis not present

## 2019-11-08 DIAGNOSIS — Z0000 Encounter for general adult medical examination without abnormal findings: Secondary | ICD-10-CM | POA: Diagnosis not present

## 2019-11-08 DIAGNOSIS — F419 Anxiety disorder, unspecified: Secondary | ICD-10-CM | POA: Diagnosis not present

## 2019-11-08 DIAGNOSIS — E782 Mixed hyperlipidemia: Secondary | ICD-10-CM | POA: Diagnosis not present

## 2019-11-08 DIAGNOSIS — Z1389 Encounter for screening for other disorder: Secondary | ICD-10-CM | POA: Diagnosis not present

## 2019-11-08 DIAGNOSIS — K3184 Gastroparesis: Secondary | ICD-10-CM | POA: Diagnosis not present

## 2019-11-08 DIAGNOSIS — G4700 Insomnia, unspecified: Secondary | ICD-10-CM | POA: Diagnosis not present

## 2019-11-08 DIAGNOSIS — I700 Atherosclerosis of aorta: Secondary | ICD-10-CM | POA: Diagnosis not present

## 2019-11-08 DIAGNOSIS — J383 Other diseases of vocal cords: Secondary | ICD-10-CM | POA: Diagnosis not present

## 2019-11-08 DIAGNOSIS — K5900 Constipation, unspecified: Secondary | ICD-10-CM | POA: Diagnosis not present

## 2019-11-08 DIAGNOSIS — G4730 Sleep apnea, unspecified: Secondary | ICD-10-CM | POA: Diagnosis not present

## 2019-11-08 DIAGNOSIS — E1140 Type 2 diabetes mellitus with diabetic neuropathy, unspecified: Secondary | ICD-10-CM | POA: Diagnosis not present

## 2019-11-08 DIAGNOSIS — J449 Chronic obstructive pulmonary disease, unspecified: Secondary | ICD-10-CM | POA: Diagnosis not present

## 2019-11-08 DIAGNOSIS — I10 Essential (primary) hypertension: Secondary | ICD-10-CM | POA: Diagnosis not present

## 2019-11-29 DIAGNOSIS — R0981 Nasal congestion: Secondary | ICD-10-CM | POA: Diagnosis not present

## 2019-11-29 DIAGNOSIS — J4542 Moderate persistent asthma with status asthmaticus: Secondary | ICD-10-CM | POA: Diagnosis not present

## 2019-11-29 DIAGNOSIS — J45909 Unspecified asthma, uncomplicated: Secondary | ICD-10-CM | POA: Diagnosis not present

## 2019-12-05 ENCOUNTER — Ambulatory Visit: Payer: Medicare HMO | Admitting: Nurse Practitioner

## 2019-12-05 ENCOUNTER — Encounter: Payer: Self-pay | Admitting: Nurse Practitioner

## 2019-12-05 VITALS — BP 126/78 | HR 70 | Ht 66.0 in | Wt 204.0 lb

## 2019-12-05 DIAGNOSIS — K5909 Other constipation: Secondary | ICD-10-CM

## 2019-12-05 DIAGNOSIS — K3184 Gastroparesis: Secondary | ICD-10-CM | POA: Diagnosis not present

## 2019-12-05 DIAGNOSIS — K649 Unspecified hemorrhoids: Secondary | ICD-10-CM | POA: Diagnosis not present

## 2019-12-05 NOTE — Progress Notes (Signed)
IMPRESSION and PLAN:    64 year old female with PMH significant for COPD , GERD, gastroparesis, chronic constipation, sleep apnea, hypertension and diabetes, appendectomy, hysterectomy.  # Gastroparesis --Was doing well but a few weeks ago developed generalized abdominal discomfort, nausea and increased intestinal gas but she was also out of Linzess and constipated at the time --Doing better after resumption of linzess and starting soft diet. Now  able to eat what she wants --She is still taking Reglan 2-3 times a day.  Reiterated potential side effects of medication such as involuntary muscle movements/lipsmacking, etc.  She knows to look for these things and if present to stop the medication and call the office --Reiterated importance of small, frequent meals  # Chronic constipation --Linzess is almost cost prohibitive for her.  She was able to get a refill recently but has to take it sparingly.  She is having problems with hemorrhoids from the constipation. --When she runs out of Linzess will next time will try Amitiza 24 mcg twice daily.  It might be more affordable for her.  If not, or does not work as well then we can always resume Linzess  # Hemorrhoids --Has been using Vicks VapoRub to soothe the burning and itching --I have asked her to avoid vapor rub.  She probably has internal hemorrhoids leading to her symptoms.  I saw a deflated external hemorrhoid on exam today.  If she has ongoing symptoms she can try Preparation H per rectum twice daily for 10 to 14 days.  If this does not help then return to clinic for anoscopy and consideration of internal hemorrhoid banding if applicable --Trial of  Balneol for perianal burning and itching.   # Colon cancer screening -She is on recall list for July 2023  # GERD  --Asymptomatic on daily PPI  HPI:    Primary GI: Erick Blinks, MD  Chief complaint : Much better now.  Patient was last seen 10/28/2017 for evaluation of  gastroparesis and constipation.  Linzess was continued as was metoclopramide.  Patient did well until several weeks ago when she developed increased gas and centralized abdominal discomfort.  Felt like rocks were in her stomach.  She has been taking Reglan 2-3 times a day but had run out of Linzess and had difficulty getting it filled due to cost.  She takes magnesium citrate 1-2 times a month.  She started eating softer foods and able to get Linzess refilled a week ago. Since then her symptoms have significantly improved. alf ago  HISTORY SINCE LAST VISIT:   Previous Endoscopic Evaluations:    Review of systems:     No chest pain, no SOB, no fevers, no urinary sx   Past Medical History:  Diagnosis Date  . Anxiety   . Arthritis    "both shoulders"  . Asthma   . Blood transfusion without reported diagnosis   . Chronic bronchitis ([facility])   . Cold extremity without peripheral vascular disease    bilaterally; "from my knees down into my feet; they get ice cold"  . COPD (chronic obstructive pulmonary disease) ([facility])   . Environmental allergies    "year round"  . Exertional dyspnea   . Gastroparesis   . GERD (gastroesophageal reflux disease)   . Headache(784.0)   . Heart murmur   . Hiatal hernia   . High cholesterol   . HTN (hypertension)   . Obesity   . Rotator cuff tear   .  Sciatic nerve pain    "tailbone down into my legs when it flares up"  . Sciatica   . Sleep apnea    CPAP, sleep study on [facility]  . Type II diabetes mellitus ([place])   . Vocal cord dysfunction    "I've had it for many years"    Patient's surgical history, family medical history, social history, medications and allergies were all reviewed in Epic   Creatinine clearance cannot be calculated (Patient's most recent lab result is older than the maximum 21 days allowed.)  Current Outpatient Medications  Medication Sig Dispense Refill  . albuterol (PROVENTIL HFA;VENTOLIN HFA) 108 (90 BASE) MCG/ACT inhaler Inhale 2  puffs into the lungs every 4 (four) hours as needed for wheezing or shortness of breath.     . ALPRAZolam (XANAX) 0.5 MG tablet Take 0.5 mg by mouth 2 (two) times daily as needed for anxiety.     Marland Kitchen aspirin 81 MG tablet Take 81 mg by mouth 2 (two) times a week.     Marland Kitchen atorvastatin (LIPITOR) 20 MG tablet Take 20 mg by mouth every evening.     . Azelastine-Fluticasone (DYMISTA) 137-50 MCG/ACT SUSP Place 1 spray into both nostrils daily as needed (for congestion).     . citalopram (CELEXA) 10 MG tablet Take 20 mg by mouth every evening.     . fluticasone (FLONASE) 50 MCG/ACT nasal spray Place 2 sprays into both nostrils daily. 16 g 2  . gabapentin (NEURONTIN) 300 MG capsule Take 300 mg by mouth at bedtime.     . insulin aspart protamine - aspart (NOVOLOG MIX 70/30 FLEXPEN) (70-30) 100 UNIT/ML FlexPen     . linaclotide (LINZESS) 290 MCG CAPS capsule Take 1 capsule (290 mcg total) by mouth daily before breakfast. 90 capsule 3  . loratadine (CLARITIN) 10 MG tablet Take 10 mg by mouth daily as needed for allergies.     Marland Kitchen losartan (COZAAR) 100 MG tablet Take 100 mg by mouth daily.     . metFORMIN (GLUCOPHAGE) 1000 MG tablet Take 1,000 mg by mouth 2 (two) times daily.     . metoCLOPramide (REGLAN) 5 MG tablet TAKE 1 TABLET BY MOUTH BEFORE MEALS AND AT BEDTIME AS NEEDED 90 tablet 0  . oxyCODONE (OXY IR/ROXICODONE) 5 MG immediate release tablet Take 1 tablet (5 mg total) by mouth every 6 (six) hours as needed for severe pain (From Pain not controlled by Tramadol). 10 tablet 0  . pantoprazole (PROTONIX) 40 MG tablet Take 1 tablet (40 mg total) by mouth 2 (two) times daily. (Patient taking differently: Take 40 mg by mouth daily. ) 60 tablet 3  . predniSONE (DELTASONE) 50 MG tablet Take 1 tablet (50 mg total) by mouth daily. 4 tablet 0  . traMADol (ULTRAM) 50 MG tablet Take 50 mg by mouth every 6 (six) hours as needed for pain.     Marland Kitchen VICTOZA 18 MG/3ML SOPN Inject 18 mg into the skin every morning.     . zolpidem  (AMBIEN) 5 MG tablet Take 5 mg by mouth at bedtime.  3   No current facility-administered medications for this visit.    Physical Exam:     BP 126/78   Pulse 70   Ht 5\' 6"  (1.676 m)   Wt 204 lb (92.5 kg)   BMI 32.93 kg/m   GENERAL:  Pleasant female in NAD PSYCH: : Cooperative, normal affect CARDIAC:  RRR PULM: Normal respiratory effort, lungs CTA bilaterally, no wheezing ABDOMEN:  Nondistended, soft, nontender.  No obvious masses, no hepatomegaly,  normal bowel sounds SKIN:  turgor, no lesions seen Musculoskeletal:  Normal muscle tone, normal strength NEURO: Alert and oriented x 3, no focal neurologic deficits  I spent 30 minutes total reviewing records, obtaining history, performing exam, counseling patient and documenting visit / findings.   Willette Cluster , NP 12/05/2019, 10:41 AM

## 2019-12-05 NOTE — Patient Instructions (Signed)
Use over the counter Balneol cleansing lotion as needed.   Use preparation H rectal cream twice daily for 14 days if hemorrhoids flare.  If no improvement call us.   Continue your reglan and protonix.   When your out of your Linzess call and we can send in Amitiza if your insurance covers it.   I appreciate the opportunity to care for you. Willette Cluster, NP-C

## 2019-12-19 NOTE — Progress Notes (Signed)
Addendum: Reviewed and agree with assessment and management plan. Debe Anfinson M, MD  

## 2019-12-28 DIAGNOSIS — M76821 Posterior tibial tendinitis, right leg: Secondary | ICD-10-CM | POA: Diagnosis not present

## 2020-01-11 DIAGNOSIS — M545 Low back pain: Secondary | ICD-10-CM | POA: Diagnosis not present

## 2020-01-11 DIAGNOSIS — M79604 Pain in right leg: Secondary | ICD-10-CM | POA: Diagnosis not present

## 2020-01-25 DIAGNOSIS — M545 Low back pain: Secondary | ICD-10-CM | POA: Diagnosis not present

## 2020-02-01 DIAGNOSIS — M545 Low back pain: Secondary | ICD-10-CM | POA: Diagnosis not present

## 2020-02-05 ENCOUNTER — Ambulatory Visit: Payer: Medicare HMO | Admitting: Internal Medicine

## 2020-02-05 ENCOUNTER — Other Ambulatory Visit: Payer: Self-pay

## 2020-02-05 ENCOUNTER — Encounter: Payer: Self-pay | Admitting: Internal Medicine

## 2020-02-05 VITALS — BP 124/70 | HR 68 | Ht 66.0 in | Wt 208.6 lb

## 2020-02-05 DIAGNOSIS — J849 Interstitial pulmonary disease, unspecified: Secondary | ICD-10-CM

## 2020-02-05 DIAGNOSIS — R0609 Other forms of dyspnea: Secondary | ICD-10-CM

## 2020-02-05 DIAGNOSIS — J387 Other diseases of larynx: Secondary | ICD-10-CM

## 2020-02-05 DIAGNOSIS — R0600 Dyspnea, unspecified: Secondary | ICD-10-CM | POA: Diagnosis not present

## 2020-02-05 NOTE — Addendum Note (Signed)
Addended by: Wyvonne Lenz on: 02/05/2020 10:10 AM   Modules accepted: Orders

## 2020-02-05 NOTE — Progress Notes (Signed)
OV 02/17/2011. 64 year old female. Obese. AA female. Remote smoker On cpap for OSA (pmd managing). REferred by Dr Nehemiah Settle.    New visit for chronic cough. Preesent for many years. Insidious onset.  Progressively with more frequency past few years. Cough rated as moderate. Usually dry cough with associated barking quality. Occ mucus present; thick and clear. Cough worsened by milk products, change in weather and dust. Hx of prior ENT eval in [place] > 15 years ago: diagnosed as "vocal cords sticking together" and remote hx of sinus surgery Also diagnosed with GERD in [place] > 15 years ago; takes protonix.  [facility] Reflux Symptom INdex score is 39 and very c/w LPR cough(severe hoarsness of voice, clearing of throat, ecess throat mucus, post nasal drip, coughing after lying down or eating, choking epidoses, sensation of globus with food sticking in thorat) and associated heart burn. Cough so severe that she could not complete spirometry in office today  Has dyspnea too: few years, insidious onset. Progressive. Rates it as moderate. Dyspnea brought on by associated chest tightness and nasal drainage or even exertion like climbing flight of steps. Dyspnea relieved by rest. Cough and dyspnea associated with wheezing and constant yellow sinus drainage as well. Frequent nocturnal awakenings with choking present. Above resulted in asthma diagnosis versus tracheobronchiolotis > 15 years ago: on advair since then which she is unsure is helping. She herself is not convinced she has asthma.   Associated med intake shows tramadol (for shoulder pain and sicatica), fish oil/ flaxseed (2 years) and ACE inhibitor intake (been on lisniopril for > 10 years due to diabetes)   Pas med hx review fromn outside record notes "hx of asthma and vocal cord dysfunction and post nasal drip. Recalls frequent ER visits atleast one per month (last er visit 2 months ago per hx) to . Denies ICU admits, intubations for  same.   REC Cough is from ACE Inhibitor, sinus drainage, vocal cord dysfunction, acid reflux and possible cough all conspiring to cause cyclical cough/LPR cough #Sinus drainage  - continue nasal steroid - refer to [facility] ENT Dr. Annalee Genta or Dr Jenne Pane #Possible Acid Reflux  - continue protonix   - take diet sheet from [place] - avoid colas, spices, cheeses, spirits, red meats, beer, chocolates, fried foods etc.,   - sleep with head end of bed elevated  - eat small frequent meals  - do not go to bed for 3 hours after last meal - stop fish oil and flaxseed for now #Asthma   - continue advair for now - might have to consider changing over to something else later depending on ENT evaluation #Vocal Cord Dysfunction - see ENT doctor first  -later  will consider referral to speech therapy with Mr Verdie Mosher #BP  - stop lisinopril - take benicar 1 tablet daily - take sample, nurse will add it in med list #Followup - I will see you in 4-6 weeks.  - Reattempt spirometry with Jerolyn Shin at time of followup -Important you comply with advice 100% correctly   OV 03/31/11: Followup cough. SAw ENT 89/17./12: no anatomic path or VCD noticed at time of exam though patient noted to have choked.  She states that she was not happy with her experience at ENT visit and does not want to visit the same office again. She is compliant with instructions except for diet though she has made improvements there. Off lisniopril. Follows sinus, gerd and astham advice. Does not like advair  diskus. Overall cough is much better. She is happy with improvement. RSI cough score dropped from 39 to 14.  She sses her hoarsenss, clearing and pos nasal drip as mild-moderate  PFTS show no more flow volume loop issues. There is restriction with mixed obstruction. DLCO 67%   REC Cough is from sinus drainage, vocal cord dysfunction, acid reflux and possible cough all conspiring to cause cyclical cough/LPR cough  #Sinus  drainage  - continue nasal steroid - nurse will do script for generic fluticasone -  #Acid Reflux  - continue protonix   - take diet sheet from [place] [facility] - avoid colas, spices, cheeses, spirits, red meats, beer, chocolates, fried foods etc.,   - sleep with head end of bed elevated  - eat small frequent meals  - do not go to bed for 3 hours after last meal - cotninue to avoid fish oil and flaxseed   #Asthma   - continue advair but nurse will give you sample of HFA and teach you how to take it with spacer; she will do script as well  #Vocal Cord Dysfunction - referral to speech therapy with Mr Verdie Mosher  #BP  - will add lisinopril to allergy list - take benicar 1 tablet daily or the equivalent Dr Nehemiah Settle gives you  #Followup - I will see you in 8 weeks.  - -Important you comply with advice 100% correctly - Glad you are much better - Flu shot today please   OV 06/26/2011 Followup for chronic cough. She continues to do better. Subjectively feels much better compared to last visit. Although objectively her cough score has only reduced by 1 point. Today, RSI cough score is 13. Level III postnasal drip. Level to hoarseness of voice, cough after lying down, choking episodes, and annoying cough. Level I sensation of lump in throat and heartburn.  In talking to her she is compliant with her nasal steroid and her Protonix for acid reflux but not compliant with diet for acid reflux. She is on Advair HFA with spacer for presumed asthma and she feels this makes her cough less compared to Advair discus. We discussed more about her asthma history and she says that she was never sure she had asthma diagnosis in [place] it was a presumptive diagnosis. She is eager to come off Advair and give a trial without it. She also gives a history of allergies and allergy shots while living in [place] and she is open to an allergy evaluation right now. In terms of cyclical cough she has finished speech therapy  evaluation and graduated from the program. Overall she feels quality-of-life is acceptable although she would like to see cough improve further. So far we have not tried Neurontin    Past, Family, Social reviewed: no change since last visit except that Dr Eula Listen is new PMD. She now states she has year round allergies. Recollects while in [place] used to take allergy shots twice a week 10 years ago. Since moving to [place] 4 years ago not seen an allergist. Liliane Shi to see allergist. She feels she does not have asthma.  Cough is from sinus drainage, vocal cord dysfunction, acid reflux and possible asthma or allergiesh all working together to cause cyclical cough/LPR cough  #Sinus drainage  - continue nasal steroid - nurse will do script for generic fluticasone  -  #Acid Reflux  - continue protonix  - contniue (REALLY IMPORANT IS DIET) diet sheet from [place] [facility] - avoid colas, spices, cheeses, spirits, red meats,  beer, chocolates, fried foods etc.,  - sleep with head end of bed elevated  - eat small frequent meals  - do not go to bed for 3 hours after last meal  - cotninue to avoid fish oil and flaxseed  #Asthma  - since diagnosis of asthma was in doubt. Please stop Advair and just use albuterol as needed  -We'll do spirometry at followup  # allergies  - Please see Dr. Jetty Duhamel in our office for allergy evaluation; will do referral  #Vocal Cord Dysfunction  - congratulations on graduating from program with speech therapy Mr Verdie Mosher  #BP  - Continue losartan  #Followup  - I will see you in 8 weeks.  - -Important you comply with advice 100% correctly  - Glad you are much better  - Cough score worksheet at followup   OV 08/25/2011 Followup for chronic cough. She is off advair as advised. She is doing gerd control.  Ojectively her cough score is not reduced. Last visit score was 13 but now it is 14. (Level III postnasal drip, and post nasal drip. . Level 2  hoarseness of voice and  choking episodes. Level 1 - cough after lying down,and  sensation of lump in throat and heartburn). Howevefr, subjectively cough much improved. Still with post nasal drip. Has seen Dr Maple Hudson for allergy 08/06/11 and 08/19/11. RAST serum profile negative except for elevated [place] IGE. Allergy skin test pending 09/16/11. On visit 08/19/11 to Dr Maple Hudson had atypical chest pain - releived with ppi in office. EKG okay. Trop was normal. . Of note, Dr Maple Hudson is also addressing her prior OSA issues.   ROS c/o anxiety, insomnia, also chest pains  - atypical  Spirometry  - fev1 1.74L/72%. Ratio 78    #Chest pains  - please see [facility] asap - > equivocal stress test and but clinically low probability February 2013; placed in clinical followup #Cough  - for sinus: follow Dr Maple Hudson recommendation  - for acid reflux: follow medications and diet  - for possible asthma: stay off advair and once cardiology ok, please have methacholine challenge test  #Followup  - complete cardiology visit and methacholine challenge  - 4-6 weeks from now  09/16/11 with Dr Maple Hudson Allergist- 22 yoF former smoker referred by Dr Marchelle Gearing who has been seeing her for multifactorial cough, questioning an allergic component At last visit she had presented with chest pain, preventing planned allergy skin testing. Now following with cardiology and pending CT scan of her heart/Dr. Excell Seltzer. Still complains of nasal congestion. Able to use her CPAP more regularly when her nose is not stopped up. Unable to do methacholine challenge last month because her baseline is from a tree scores were too low. CPAP autotitration done/[facility]. Pending download. PFT- 09/11/11- FVC 2.15/68%, FEV1 1.59/ 64%, FEV1/FVC 0.80. FEF 25-75 % 0.37/ 15%   OV 03/21/2013 Followup chronic cough  - I have not seen her in nearly 15 months. She says that basically her cough resolved but cough is no longer chronic and only occurs intermittently when she has "bronchitis".  In the last year she's had antibiotics and prednisone for acute episodes of coughing at least 3 times. In between episodes she is asymptomatic. Currently she feels she's having one such episode in the past 3 weeks with chest congestion, cough with white mucus, fatigue and associated wheeze. Currently RSI cough score is 21 and show severe cough.  - Of note, she has not had methacholine challenge test that I  advised a year and half ago. She says that she does not have vocal cord dysfunction based on Dr. Myrtis Hopping exam in August 2012 ENT physician   REC levaquin and pred burst  04/20/2013 Follow up and Med review  new med calendar - pt brought all meds with her today.  does report some sinus pressure, congestion, SOB, wheezing, chest tightness. Is feeling better but still gets into coughing fits that takes her a while to calm down. Strong odors seem to causes attacks along with laughing  She did have an attack in the office. Sats were normal however she has significant upper airway psuedowheezing w/ barking cough.  No fever , chest pain, over reflux , orthopnea or edema.  No using CPAP lately   REC top Advair . Add Chlor trimeton  2 At bedtime   Change Loratadine  in am.  Add Pepcid  At bedtime   Avoid throat clearing , use sips of water.  Saline nasal rinses Twice daily  .  NO MINT PRODUCTS  GERD diet .  We are setting you up for a CT neck .  Restart CPAP At bedtime   follow up Dr. Marchelle Gearing in 4 weeks.  Please contact office for sooner follow up if symptoms do not improve or worsen or seek emergency care    OV  05/26/2013 Chief Complaint  Patient presents with  . Follow-up    Pt c/o chest tightness, dry cough, wheezing and sob. Pt states this was some better while she was on prednisone. Last dose x 3 weeks ago.   Followup for chronic cough. She says her last 2-3 weeks that she's having an acute bronchitis flare with yellow phlegm. She feels she needs antibiotics and  prednisone. In fact in the time that she last saw me in September 2014 she's had another round of antibiotics and prednisone with her primary care physician Dr. Eula Listen. She feels her chest is congested although in the office she is clearly having a barking cough, laryngeal spasm and upper airway noise intermittently with changes in voice. It is fairly significant. I reminded her that this is consistent with vocal cord dysfunction but she said that her ear nose throat surgeon cleared her for the same. Of note, she had a CT scan of the neck and this was normal.  07/26/2013 Follow up  2 month follow up - reports some  congestion, head congestion w/ light yellow mucus that is on/off. Has sinus drainage daily . Is not using claritin or chlortrimeton as recommended.  Was taken off ACE in past , now on Cozaar.   RSI cough score unchanged at 21  Had Esophagram on 1/8 w/ mild GERD/HH. No sign delay emptying .  No chest pain, orthopnea, edema , fever.  +hoarseness on/off.   She says she feels so much better with CPAP , using during daytime for naps and At bedtime  . Is amazed at how much she feels better.    OV 03/20/2014  Chief Complaint  Patient presents with  . Follow-up    Pt states she is unable to hold her voice as long when she sings. Pt c/o PND and prod cough, pt thinks its d/t allergies. Pt c/o chest soreness on right upper chest.    Followup chronic cough  - I have not seen the patient in nearly 10 months. Overall cough was stable but recently she has had a flareup. I'm not so sure that she is followed instructions compliantly. It took me  a while to understand the medication regimen but it appears that she takes dymista for  Sinuses but saline wash as needed, and not taking claritin, Advair as needed for her lungs, Protonix schedule followup acid reflux but Pepcid only as needed. She is only somewhat compliant with the Neurontin. She continues to have sinus drainage, clearing of the throat and  the laryngeal/barking cough that she feels is coming from the throat. This is associated with dyspnea and is of moderate severity, and some wheezing but she feels wheeze is upper airway  REC #Chronic cough  - I thnk cough is related to voice box issues and loss of neural control of the same - Please take prednisone 40 mg x1 day, then 30 mg x1 day, then 20 mg x1 day, then 10 mg x1 day, and then 5 mg x1 day and stop - refer Dr Barnie Alderman of [facility] for chronic cough - for now continue current nasal, acid reflux and neurontin therapies - continue albuterol as needed ; for now hold off scheduled advair - repeat HRCT chest wo contrast for chronic cough  #FOllowup  3 months - after you see Dr Delford Field of [facility]  Come sooner if there are problems o  OV 10/22/2014  Chief Complaint  Patient presents with  . Follow-up    Pt stated her cough has improved. Pt c/o hoarseness and chest tightness. Pt is wearing CPAP nightly and denies any issues with machine, mask or pressure.     FU chronic cough  - IN interim in Sept 2015 had CT chest : CT chest 03/26/14 shows worsening infiltreats in bases compaed to nov 2014. COncern for aspiration and referred  To GI. She does not know details of GI Visit but review of records shows she has gastroparesis and hs been started on reglan. Also she saw Dr Delford Field of ENT in [place]. WIth above measure cough has resolved. /nearly resolved. No respiratory issues.   reports that she quit smoking about 18 years ago. Her smoking use included Cigarettes. She has a 25 pack-year smoking history. She has never used smokeless tobacco.   OV 11/12/2014  Chief Complaint  Patient presents with  . Follow-up    Pt here after PFT. Pt stated her breathing has slightly improved,she is less SOB. Pt stated her cough has also imrpoved. Pt has not yet had autoimmune panel.    Follow-up chronic cough with interstitial lung disease findings  As of last visit her cough  resolved. Cough was related to aspiration. As soon as she started following gastroenterology advice cough resolved. However follow-up CT scan of the chest in April 2016 compared to fall 2015 showed persistent ILD findings as described below [personally reviewed image]. At this point she is asymptomatic. I had her do pulmonary function test and autoimmune workup and reports for follow-up. Of these she only completed pulmonary function test that shows restriction with slight reduction in diffusion capacity [some of the restriction can be due to obesity]. She continues to be asymptomatic. She's not having reflux symptoms anymore. Of note, she forgot to have a autoimmune test. She does admit that her daughter has lupus   IMPRESSION: CT chest 10/29/2014 personally reviewed image  1. Stable findings of peribronchovascular ground-glass attenuation, predominantly in the basal portions of the lungs bilaterally, again favored to reflect nonspecific interstitial pneumonia (NSIP). 2. Mild air trapping, indicative of mild small airways disease. Electronically Signed  By: Trudie Reed M.D.  On: 10/29/2014 16:19  Pulmonary function  test 10/31/2014 shows restriction with reduced diffusion capacity. FVC 2.1 L/69%, FEV1 1.8 L/75% and ratio of 86. No broncho-dilator response. Total lung capacity 3.5 L/63% and DLCO 20.0/70% Sleep Apnea FOLLOWS FOR: pt wearing cpap nightly, c/o mask getting hot sometimes qhs.  has trouble staying asleep.     OV 06/10/2015  Chief Complaint  Patient presents with  . Follow-up    Pt states she has been getting more frequent sinus infections, slight increase in SOB, chest heaviness. Pt here after HRCT. Pt wearing CPAP nightly.    Follow-up multifactorial chronic cough Follow-up mild basal interstitial lung disease not otherwise specified  She currently reports that she is not having much cough although after I left the office I heard a cough and periodically she did have  variable upper airway noise. She continues to have gastroparesis related acid reflux particularly when she eats food she regurgitates. She last saw GI March 2016 and was asked to increase and metoclopramide. She's not having much dyspnea. Overall she feels okay. She is having some atypical chest pain going on for a few months in the right supramammary area   07/16/2015-64 year old female former smoker followed by Dr. Marchelle Gearingamaswamy for mild ILD, recurrent sinus infections, chronic cough, Sleep Apnea FOLLOWS FOR: pt wearing cpap nightly, c/o mask getting hot sometimes qhs.  has trouble staying asleep.   Here for CPAP follow-up. "Loves it" and says she is wearing it all night every night. Fullface mask, [facility]/12.  Not noticing active respiratory problems currently with little cough    OV  09/18/2015  Chief Complaint  Patient presents with  . Follow-up    pt [facility]- pt c/o prod cough with thick clear mucus, SOB with exertion, wheezing.     Follow-up chronic cough which is multifactorial with a strong component of irritable larynx syndrome, vocal cord dysfunction and gastroparesis related to diabetes  Follow-up interstitial lung disease probably as a result of above but etiology uncertain   I last saw her in November 2016. She was supposed to follow-up with me in November 2017. In December 2016 I did make her see GI. They did a swallow study. I reviewed those results and she did not aspirate but coughed significantly during swallowing. No specific interventions have been recommended. It appears she has increase and metoclopramide. Then 09/01/2015 ended up in the ER with shortness of breath and wheezing episode and was given antibiotics and prednisone according to her history. Now she significantly improved and close to baseline. She does have some wheezing but she says it's all from the upper airway. She does not want another course of anabiotic's of prednisone. She is on  Neurontin but this is not helping. Her creatinine was 1 mg percent troponin was normal and hemoglobin was normal 09/01/2015. She had chest x-ray 09/01/2015 and is clear per report. I did not visualize this film.  In terms of her vocal cord dysfunction review of my prior notes shows that she is seen ENT multiple times. Most recently [facility] Delford FieldWright and she had resolution but now the cough due to irritable larynx has come back. She says [facility] cannot [facility] because of [facility] reasons. She is willing to reestablish with the ENT locally.   OV 06/15/2016  Chief Complaint  Patient presents with  . Follow-up    Pt states she is waking every 2 hours because she is SOB, pt is still wearing CPAP. Pt c/o prod  cough with clear mucus. Pt states she just completed a pred taper and abx for URI.      Follow-up multifactorial chronic cough Follow-up mild basal interstitial lung disease not otherwise specified   Obese female with significant vocal cord dysfunction/irritable larynx syndrome returns for follow-up. Last seen March 2017. She had follow-up high-resolution CT chest 06/09/2016 that I personally visualized. The findings show extremely subtle presence of interstitial lung disease all stable since 2014. In terms of symptoms of cough she is overall stable. However she tells me she has significant episodes of dyspnea at night when she wears a CPAP. Here in the office when asked her to open her mouth she made it given the significant upper airway coughing spell. She also complains of dyspnea on exertion.   OV 08/14/2016  Chief Complaint  Patient presents with  . Follow-up    Was told to follow up after stress test. Stress test was done back in Dec 2017. Breathing has ok for the past few weeks, had a minor URI.      64 y.o. Marland KitchenARIAUNA FARABEE presents for fu of CPST results in 07/01/16. Reviewed terst results with her. Somewhat submaximal of RER 0.96. Audible wheezing prior  to start of test. No EIB. Has vo2 max that went from low normal to normal when correced for IBW. Not clear if there was diast dysfn. Got dyspneic  OV 02/16/2017  Chief Complaint  Patient presents with  . Follow-up    Pt states she thinks her breathing has recently worsened d/t bronchitis. Pt states she is taking pred. Pt c/o non prod cough, chest discomfort from coughing, chest congestion. Pt deines f/c/s.     Follow-up multifactorial chronic cough with vcd Follow-up mild basal interstitial lung disease not otherwise specified   This is a routine follow-up for the above issues but 2 days ago she ended up in the ER because of sinus drainage bronchospasm. According to her history she was discharged with 10 days of amoxicillin and 5 days of prednisone. She is much improved although she still complaining of chest tightness in throat tightness and cough. She also some increased wheezing than baseline but all improving. There are no other new issues. Labs and chest x-ray from the ER visit review reviewed and documented below.   OV 08/20/17  Chief Complaint  Patient presents with  . Follow-up    HRCT done 08/19/17.  Pt states the SOB is a little worse since last visit and pt does have some coughing with occ. thick mucus and congestion due to sinus problems   Follow-up multifactorial chronic cough with severe irritable larynx/VCD  follow-up mild basal interstitial lung disease that has been stable for many years.    Overall stable.  In the interim she has had shoulder surgery.  She complains of class 3/2 dyspnea on exertion that is stable.  Her weight is unchanged.  No new issues. Walking desaturation test on 08/20/2017 185 feet x 3 laps on ROOM AIR:  did NOT desaturate. Rest pulse ox was 100%, final pulse ox was 100%. HR response 68/min at rest to 91/min at peak exertion. Patient LORRAINE CIMMINO  Did not Desaturate < 88% . Lorina Rabon did not  Desaturated </= 3% points. Lorina Rabon yes did get  tachyardic CT scan of the chest showed stability.   IMPRESSION: HRCT 1. There are very subtle changes in the lung bases which are again suggestive of interstitial lung disease. The CT pattern is indeterminate for  usual interstitial pneumonia (UIP), and given the stability compared to 2017, and the presence of air trapping, is rather favored to reflect mild nonspecific interstitial pneumonia (NSIP). 2. Tracheobronchomalacia. 3. Aortic atherosclerosis, in addition to left circumflex coronary artery disease. Please note that although the presence of coronary artery calcium documents the presence of coronary artery disease, the severity of this disease and any potential stenosis cannot be assessed on this non-gated CT examination. Assessment for potential risk factor modification, dietary therapy or pharmacologic therapy may be warranted, if clinically indicated.  Aortic Atherosclerosis (ICD10-I70.0).   Electronically Signed   By: Trudie Reed M.D.   On: 08/19/2017 12:56   OV 02/05/2020  Subjective:  Patient ID: Lavona Mound, female , DOB: 1956-03-17 , age 71 y.o. , MRN: [id number] , ADDRESS: [address] [place] [address]   02/05/2020 -   Chief Complaint  Patient presents with  . Follow-up    Pt states she has been doing okay since last visit. Denies any real complaints of SOB or coughing. Pt states that she has been seeing her PCP when needed.   Follow-up multifactorial chronic cough with severe irritable larynx/VCD  follow-up mild basal interstitial lung disease that has been stable for many years.    Tenishia Ekman 64 y.o. -returns for follow-up of the above medical problems.  Not seen her since 2019.  She is supposed to have seen me in 2020 but because of the pandemic of COVID-19 she did not see me.  She says she has had a Covid vaccine.  She has been doing stable.  She uses a CPAP.  Therefore her vocal cord wheezing is somewhat stable although  persistent and mild.  Her dyspnea on exertion is mild but still present.  She thinks overall maybe she is a little bit better.  Definitely no new medical issues.  No new medications.  No complications in her health so far.  On examination today when she opened her mouth we could hear wheezing coming from her upper airways.  When she closed about this wheezing went away.  When she took a deep breath this wheezing came back.  She could not cooperate with exam nitric oxide testing     ROS - per HPI     has a past medical history of Anxiety, Arthritis, Asthma, Blood transfusion without reported diagnosis, Chronic bronchitis (HCC), Cold extremity without peripheral vascular disease, COPD (chronic obstructive pulmonary disease) (HCC), Environmental allergies, Exertional dyspnea, Gastroparesis, GERD (gastroesophageal reflux disease), Headache(784.0), Heart murmur, Hiatal hernia, High cholesterol, HTN (hypertension), Obesity, Rotator cuff tear, Sciatic nerve pain, Sciatica, Sleep apnea, Type II diabetes mellitus (HCC), and Vocal cord dysfunction.   reports that she quit smoking about 23 years ago. Her smoking use included cigarettes. She has a 25.00 pack-year smoking history. She has never used smokeless tobacco.  Past Surgical History:  Procedure Laterality Date  . ABDOMINAL ADHESION SURGERY  ~ 1978  . APPENDECTOMY  1976  . NASAL SINUS SURGERY  1992  . OOPHORECTOMY  1976   w/ovarian cyst excision and appy  . SHOULDER ARTHROSCOPY W/ ROTATOR CUFF REPAIR  01/28/12   left  . TOE SURGERY     for ingrown [id number] ?left  . TOTAL ABDOMINAL HYSTERECTOMY  1985    Allergies  Allergen Reactions  . Lisinopril Cough    Immunization History  Administered Date(s) Administered  . Influenza Split 03/31/2011, 05/19/2013, 03/13/2014, 05/16/2015, 04/26/2017  . Influenza,inj,Quad PF,6+ Mos 03/13/2016, 03/29/2019  . Moderna SARS-COVID-2 Vaccination 09/30/2019, 10/28/2019  .  Pneumococcal Polysaccharide-23  07/14/2001  . Td 07/15/1998  . Tdap 10/11/2016  . Zoster 11/10/2016  . Zoster Recombinat (Shingrix) 05/03/2017    Family History  Problem Relation Age of Onset  . Heart disease Mother   . Asthma Mother   . Heart failure Maternal Grandmother   . Asthma Son   . Asthma Daughter   . Lupus Daughter   . Diabetes Other        "everybody on both sides"  . Colon cancer Neg Hx   . Throat cancer Neg Hx   . Stomach cancer Neg Hx   . Esophageal cancer Neg Hx   . Rectal cancer Neg Hx      Current Outpatient Medications:  .  albuterol (PROVENTIL HFA;VENTOLIN HFA) 108 (90 BASE) MCG/ACT inhaler, Inhale 2 puffs into the lungs every 4 (four) hours as needed for wheezing or shortness of breath. , Disp: , Rfl:  .  ALPRAZolam (XANAX) 0.5 MG tablet, Take 0.5 mg by mouth 2 (two) times daily as needed for anxiety. , Disp: , Rfl:  .  aspirin 81 MG tablet, Take 81 mg by mouth 2 (two) times a week. , Disp: , Rfl:  .  atorvastatin (LIPITOR) 20 MG tablet, Take 20 mg by mouth every evening. , Disp: , Rfl:  .  Azelastine-Fluticasone (DYMISTA) 137-50 MCG/ACT SUSP, Place 1 spray into both nostrils daily as needed (for congestion). , Disp: , Rfl:  .  citalopram (CELEXA) 10 MG tablet, Take 20 mg by mouth every evening. , Disp: , Rfl:  .  fluticasone (FLONASE) 50 MCG/ACT nasal spray, Place 2 sprays into both nostrils daily., Disp: 16 g, Rfl: 2 .  gabapentin (NEURONTIN) 300 MG capsule, Take 300 mg by mouth at bedtime. , Disp: , Rfl:  .  insulin aspart protamine - aspart (NOVOLOG MIX 70/30 FLEXPEN) (70-30) 100 UNIT/ML FlexPen, , Disp: , Rfl:  .  linaclotide (LINZESS) 290 MCG CAPS capsule, Take 1 capsule (290 mcg total) by mouth daily before breakfast., Disp: 90 capsule, Rfl: 3 .  loratadine (CLARITIN) 10 MG tablet, Take 10 mg by mouth daily as needed for allergies. , Disp: , Rfl:  .  losartan (COZAAR) 100 MG tablet, Take 100 mg by mouth daily. , Disp: , Rfl:  .  metFORMIN (GLUCOPHAGE) 1000 MG tablet, Take 1,000 mg by  mouth 2 (two) times daily. , Disp: , Rfl:  .  metoCLOPramide (REGLAN) 5 MG tablet, TAKE 1 TABLET BY MOUTH BEFORE MEALS AND AT BEDTIME AS NEEDED, Disp: 90 tablet, Rfl: 0 .  pantoprazole (PROTONIX) 40 MG tablet, Take 1 tablet (40 mg total) by mouth 2 (two) times daily. (Patient taking differently: Take 40 mg by mouth daily. ), Disp: 60 tablet, Rfl: 3 .  traMADol (ULTRAM) 50 MG tablet, Take 50 mg by mouth every 6 (six) hours as needed for pain. , Disp: , Rfl:  .  zolpidem (AMBIEN) 5 MG tablet, Take 5 mg by mouth at bedtime., Disp: , Rfl: 3      Objective:   Vitals:   02/05/20 0948  BP: 124/70  Pulse: 68  SpO2: 99%  Weight: (!) 208 lb 9.6 oz (94.6 kg)  Height: 5\' 6"  (1.676 m)    Estimated body mass index is 33.67 kg/m as calculated from the following:   Height as of this encounter: 5\' 6"  (1.676 m).   Weight as of this encounter: 208 lb 9.6 oz (94.6 kg).  @WEIGHTCHANGE @  Filed Weights   02/05/20 0948  Weight: (!) 208  lb 9.6 oz (94.6 kg)     Physical Exam Obese pleasant female.  When she open the mouth or wheezing was present when she closed her mouth the wheezing went away.  The wheezing was coming from the upper airways.  When she took a deep breath I could hear transmitted wheezing but otherwise there was no wheezing.  Normal heart sounds.  Alert and oriented x3.  She is not able to do exhaled nitric oxide testing Assessment:       ICD-10-CM   1. Irritable larynx syndrome  J38.7   2. Dyspnea on exertion  R06.00   3. ILD (interstitial lung disease) (HCC)  J84.9        Plan:     Patient Instructions  ILD (interstitial lung disease) (HCC) Dyspnea on exertion  -glad clinically your are stable - last CT was in 2019  Plan  - repeat HRCT supine and prone - next few to several weeks   Chronic cough Irritable larynx syndrome  - stable and mild though peristent   plan - expectant followup + ENT followup  Followup Will call with HRCT results but otherwise return in 1  year or sooner if needed      SIGNATURE    Dr. Kalman Shan, M.D., F.C.C.P,  Pulmonary and Critical Care Medicine Staff Physician, [facility] Director - Interstitial Lung Disease  Program  Pulmonary Fibrosis [facility] [place], [place], [address]  Pager: [phone], If no answer or between  15:00h - 7:00h: call [phone] Telephone: [phone]  10:07 AM 02/05/2020

## 2020-02-05 NOTE — Patient Instructions (Addendum)
ILD (interstitial lung disease) (HCC) Dyspnea on exertion  -glad clinically your are stable - last CT was in 2019  Plan  - repeat HRCT supine and prone - next few to several weeks   Chronic cough Irritable larynx syndrome  - stable and mild though peristent   plan - expectant followup + ENT followup  Followup Will call with HRCT results but otherwise return in 1 year or sooner if needed

## 2020-02-08 ENCOUNTER — Other Ambulatory Visit: Payer: Self-pay

## 2020-02-08 ENCOUNTER — Other Ambulatory Visit: Payer: Self-pay | Admitting: Internal Medicine

## 2020-02-08 ENCOUNTER — Ambulatory Visit (INDEPENDENT_AMBULATORY_CARE_PROVIDER_SITE_OTHER)
Admission: RE | Admit: 2020-02-08 | Discharge: 2020-02-08 | Disposition: A | Discharge status: Home / Self Care | Payer: Medicare HMO | Source: Ambulatory Visit | Attending: Internal Medicine | Admitting: Internal Medicine

## 2020-02-08 DIAGNOSIS — J479 Bronchiectasis, uncomplicated: Secondary | ICD-10-CM | POA: Diagnosis not present

## 2020-02-08 DIAGNOSIS — Z1231 Encounter for screening mammogram for malignant neoplasm of breast: Secondary | ICD-10-CM

## 2020-02-08 DIAGNOSIS — J849 Interstitial pulmonary disease, unspecified: Secondary | ICD-10-CM

## 2020-02-16 DIAGNOSIS — Z20822 Contact with and (suspected) exposure to covid-19: Secondary | ICD-10-CM | POA: Diagnosis not present

## 2020-02-16 DIAGNOSIS — Z03818 Encounter for observation for suspected exposure to other biological agents ruled out: Secondary | ICD-10-CM | POA: Diagnosis not present

## 2020-02-19 DIAGNOSIS — H43811 Vitreous degeneration, right eye: Secondary | ICD-10-CM | POA: Diagnosis not present

## 2020-02-19 DIAGNOSIS — H0102A Squamous blepharitis right eye, upper and lower eyelids: Secondary | ICD-10-CM | POA: Diagnosis not present

## 2020-02-19 DIAGNOSIS — H10413 Chronic giant papillary conjunctivitis, bilateral: Secondary | ICD-10-CM | POA: Diagnosis not present

## 2020-02-19 DIAGNOSIS — E119 Type 2 diabetes mellitus without complications: Secondary | ICD-10-CM | POA: Diagnosis not present

## 2020-02-19 DIAGNOSIS — H0102B Squamous blepharitis left eye, upper and lower eyelids: Secondary | ICD-10-CM | POA: Diagnosis not present

## 2020-02-19 DIAGNOSIS — H40013 Open angle with borderline findings, low risk, bilateral: Secondary | ICD-10-CM | POA: Diagnosis not present

## 2020-02-19 DIAGNOSIS — H2513 Age-related nuclear cataract, bilateral: Secondary | ICD-10-CM | POA: Diagnosis not present

## 2020-02-25 NOTE — Progress Notes (Signed)
CT scan with stable burnt out scar tissue. Plan - give rutine 15 min followup in 1 year

## 2020-03-11 ENCOUNTER — Other Ambulatory Visit: Payer: Self-pay

## 2020-03-11 ENCOUNTER — Ambulatory Visit
Admission: RE | Admit: 2020-03-11 | Discharge: 2020-03-11 | Disposition: A | Discharge status: Home / Self Care | Payer: Medicare HMO | Source: Ambulatory Visit | Attending: Internal Medicine | Admitting: Internal Medicine

## 2020-03-11 DIAGNOSIS — Z1231 Encounter for screening mammogram for malignant neoplasm of breast: Secondary | ICD-10-CM | POA: Diagnosis not present

## 2020-03-15 DIAGNOSIS — Z20828 Contact with and (suspected) exposure to other viral communicable diseases: Secondary | ICD-10-CM | POA: Diagnosis not present

## 2020-03-20 DIAGNOSIS — J209 Acute bronchitis, unspecified: Secondary | ICD-10-CM | POA: Diagnosis not present

## 2020-04-01 DIAGNOSIS — Z20828 Contact with and (suspected) exposure to other viral communicable diseases: Secondary | ICD-10-CM | POA: Diagnosis not present

## 2020-05-02 DIAGNOSIS — H52209 Unspecified astigmatism, unspecified eye: Secondary | ICD-10-CM | POA: Diagnosis not present

## 2020-05-02 DIAGNOSIS — H5203 Hypermetropia, bilateral: Secondary | ICD-10-CM | POA: Diagnosis not present

## 2020-05-02 DIAGNOSIS — H524 Presbyopia: Secondary | ICD-10-CM | POA: Diagnosis not present

## 2020-05-09 DIAGNOSIS — J370 Chronic laryngitis: Secondary | ICD-10-CM | POA: Diagnosis not present

## 2020-05-09 DIAGNOSIS — G4730 Sleep apnea, unspecified: Secondary | ICD-10-CM | POA: Diagnosis not present

## 2020-05-09 DIAGNOSIS — I700 Atherosclerosis of aorta: Secondary | ICD-10-CM | POA: Diagnosis not present

## 2020-05-09 DIAGNOSIS — I10 Essential (primary) hypertension: Secondary | ICD-10-CM | POA: Diagnosis not present

## 2020-05-09 DIAGNOSIS — E782 Mixed hyperlipidemia: Secondary | ICD-10-CM | POA: Diagnosis not present

## 2020-05-09 DIAGNOSIS — K3184 Gastroparesis: Secondary | ICD-10-CM | POA: Diagnosis not present

## 2020-05-09 DIAGNOSIS — J4542 Moderate persistent asthma with status asthmaticus: Secondary | ICD-10-CM | POA: Diagnosis not present

## 2020-05-09 DIAGNOSIS — E1140 Type 2 diabetes mellitus with diabetic neuropathy, unspecified: Secondary | ICD-10-CM | POA: Diagnosis not present

## 2020-05-09 DIAGNOSIS — Z7984 Long term (current) use of oral hypoglycemic drugs: Secondary | ICD-10-CM | POA: Diagnosis not present

## 2020-05-09 DIAGNOSIS — J383 Other diseases of vocal cords: Secondary | ICD-10-CM | POA: Diagnosis not present

## 2020-05-09 DIAGNOSIS — J449 Chronic obstructive pulmonary disease, unspecified: Secondary | ICD-10-CM | POA: Diagnosis not present

## 2020-05-09 DIAGNOSIS — M519 Unspecified thoracic, thoracolumbar and lumbosacral intervertebral disc disorder: Secondary | ICD-10-CM | POA: Diagnosis not present

## 2020-07-23 ENCOUNTER — Telehealth: Payer: Self-pay | Admitting: Internal Medicine

## 2020-07-23 ENCOUNTER — Other Ambulatory Visit: Payer: HMO

## 2020-07-23 ENCOUNTER — Other Ambulatory Visit: Payer: Self-pay

## 2020-07-23 DIAGNOSIS — Z20822 Contact with and (suspected) exposure to covid-19: Secondary | ICD-10-CM

## 2020-07-23 NOTE — Telephone Encounter (Signed)
Looks like we referred the pt to ENT in 2018- Dr Delford Field at [facility]  Last ov note states:  Chronic cough Irritable larynx syndrome  - stable and mild though peristent     plan - expectant followup + ENT followup   Pt is already established with ENT so she should be able to contact them to schedule f/u  [facility] for Heather with [facility]

## 2020-07-24 NOTE — Telephone Encounter (Signed)
[name] is returning phone call. Heather phone number is [phone].

## 2020-07-24 NOTE — Telephone Encounter (Signed)
Heather from [facility] returning call from [place] regarding mutual pt . Please advise   Call Back [phone]

## 2020-07-24 NOTE — Telephone Encounter (Signed)
LMTCB for [name] 

## 2020-07-24 NOTE — Telephone Encounter (Signed)
Called and spoke with Herbert Seta from  [facility] letting her know that pt is already established with ENT Dr. Delford Field so she should be able to contact their office to get f/u scheduled. Heather verbalized understanding. Nothing further needed.

## 2020-07-25 LAB — SARS-COV-2, NAA 2 DAY TAT

## 2020-07-25 LAB — NOVEL CORONAVIRUS, NAA: SARS-CoV-2, NAA: NOT DETECTED

## 2020-09-04 DIAGNOSIS — Z794 Long term (current) use of insulin: Secondary | ICD-10-CM | POA: Diagnosis not present

## 2020-09-04 DIAGNOSIS — E1140 Type 2 diabetes mellitus with diabetic neuropathy, unspecified: Secondary | ICD-10-CM | POA: Diagnosis not present

## 2020-09-04 DIAGNOSIS — J4541 Moderate persistent asthma with (acute) exacerbation: Secondary | ICD-10-CM | POA: Diagnosis not present

## 2020-09-24 DIAGNOSIS — J342 Deviated nasal septum: Secondary | ICD-10-CM | POA: Diagnosis not present

## 2020-09-24 DIAGNOSIS — J3489 Other specified disorders of nose and nasal sinuses: Secondary | ICD-10-CM | POA: Diagnosis not present

## 2020-09-24 DIAGNOSIS — K219 Gastro-esophageal reflux disease without esophagitis: Secondary | ICD-10-CM | POA: Diagnosis not present

## 2020-09-24 DIAGNOSIS — R0989 Other specified symptoms and signs involving the circulatory and respiratory systems: Secondary | ICD-10-CM | POA: Diagnosis not present

## 2020-09-24 DIAGNOSIS — H6123 Impacted cerumen, bilateral: Secondary | ICD-10-CM | POA: Diagnosis not present

## 2020-11-12 DIAGNOSIS — E782 Mixed hyperlipidemia: Secondary | ICD-10-CM | POA: Diagnosis not present

## 2020-11-12 DIAGNOSIS — M519 Unspecified thoracic, thoracolumbar and lumbosacral intervertebral disc disorder: Secondary | ICD-10-CM | POA: Diagnosis not present

## 2020-11-12 DIAGNOSIS — K3184 Gastroparesis: Secondary | ICD-10-CM | POA: Diagnosis not present

## 2020-11-12 DIAGNOSIS — J383 Other diseases of vocal cords: Secondary | ICD-10-CM | POA: Diagnosis not present

## 2020-11-12 DIAGNOSIS — K5900 Constipation, unspecified: Secondary | ICD-10-CM | POA: Diagnosis not present

## 2020-11-12 DIAGNOSIS — J370 Chronic laryngitis: Secondary | ICD-10-CM | POA: Diagnosis not present

## 2020-11-12 DIAGNOSIS — Z0000 Encounter for general adult medical examination without abnormal findings: Secondary | ICD-10-CM | POA: Diagnosis not present

## 2020-11-12 DIAGNOSIS — E1140 Type 2 diabetes mellitus with diabetic neuropathy, unspecified: Secondary | ICD-10-CM | POA: Diagnosis not present

## 2020-11-12 DIAGNOSIS — J449 Chronic obstructive pulmonary disease, unspecified: Secondary | ICD-10-CM | POA: Diagnosis not present

## 2020-11-12 DIAGNOSIS — F419 Anxiety disorder, unspecified: Secondary | ICD-10-CM | POA: Diagnosis not present

## 2020-11-12 DIAGNOSIS — I700 Atherosclerosis of aorta: Secondary | ICD-10-CM | POA: Diagnosis not present

## 2020-11-12 DIAGNOSIS — I10 Essential (primary) hypertension: Secondary | ICD-10-CM | POA: Diagnosis not present

## 2021-01-10 ENCOUNTER — Ambulatory Visit
Admission: RE | Admit: 2021-01-10 | Discharge: 2021-01-10 | Disposition: A | Discharge status: Home / Self Care | Payer: HMO | Source: Ambulatory Visit | Attending: Internal Medicine | Admitting: Internal Medicine

## 2021-01-10 ENCOUNTER — Other Ambulatory Visit: Payer: Self-pay | Admitting: Internal Medicine

## 2021-01-10 DIAGNOSIS — G4733 Obstructive sleep apnea (adult) (pediatric): Secondary | ICD-10-CM | POA: Diagnosis not present

## 2021-01-10 DIAGNOSIS — I10 Essential (primary) hypertension: Secondary | ICD-10-CM | POA: Diagnosis not present

## 2021-01-10 DIAGNOSIS — J449 Chronic obstructive pulmonary disease, unspecified: Secondary | ICD-10-CM | POA: Diagnosis not present

## 2021-01-10 DIAGNOSIS — J4489 Other specified chronic obstructive pulmonary disease: Secondary | ICD-10-CM

## 2021-01-10 DIAGNOSIS — Z8611 Personal history of tuberculosis: Secondary | ICD-10-CM

## 2021-01-10 DIAGNOSIS — E1140 Type 2 diabetes mellitus with diabetic neuropathy, unspecified: Secondary | ICD-10-CM | POA: Diagnosis not present

## 2021-02-10 ENCOUNTER — Other Ambulatory Visit: Payer: Self-pay | Admitting: Internal Medicine

## 2021-02-10 DIAGNOSIS — Z1231 Encounter for screening mammogram for malignant neoplasm of breast: Secondary | ICD-10-CM

## 2021-02-17 DIAGNOSIS — M5441 Lumbago with sciatica, right side: Secondary | ICD-10-CM | POA: Diagnosis not present

## 2021-02-17 DIAGNOSIS — M5126 Other intervertebral disc displacement, lumbar region: Secondary | ICD-10-CM | POA: Diagnosis not present

## 2021-02-19 DIAGNOSIS — H2513 Age-related nuclear cataract, bilateral: Secondary | ICD-10-CM | POA: Diagnosis not present

## 2021-02-19 DIAGNOSIS — H10413 Chronic giant papillary conjunctivitis, bilateral: Secondary | ICD-10-CM | POA: Diagnosis not present

## 2021-02-19 DIAGNOSIS — H0102A Squamous blepharitis right eye, upper and lower eyelids: Secondary | ICD-10-CM | POA: Diagnosis not present

## 2021-02-19 DIAGNOSIS — H43811 Vitreous degeneration, right eye: Secondary | ICD-10-CM | POA: Diagnosis not present

## 2021-02-19 DIAGNOSIS — H0102B Squamous blepharitis left eye, upper and lower eyelids: Secondary | ICD-10-CM | POA: Diagnosis not present

## 2021-02-19 DIAGNOSIS — H40013 Open angle with borderline findings, low risk, bilateral: Secondary | ICD-10-CM | POA: Diagnosis not present

## 2021-02-19 DIAGNOSIS — E119 Type 2 diabetes mellitus without complications: Secondary | ICD-10-CM | POA: Diagnosis not present

## 2021-02-20 DIAGNOSIS — M5126 Other intervertebral disc displacement, lumbar region: Secondary | ICD-10-CM | POA: Diagnosis not present

## 2021-02-20 DIAGNOSIS — M545 Low back pain, unspecified: Secondary | ICD-10-CM | POA: Diagnosis not present

## 2021-02-20 DIAGNOSIS — S20412A Abrasion of left back wall of thorax, initial encounter: Secondary | ICD-10-CM | POA: Diagnosis not present

## 2021-02-24 DIAGNOSIS — M4316 Spondylolisthesis, lumbar region: Secondary | ICD-10-CM | POA: Diagnosis not present

## 2021-02-24 DIAGNOSIS — Z6833 Body mass index (BMI) 33.0-33.9, adult: Secondary | ICD-10-CM | POA: Diagnosis not present

## 2021-02-24 DIAGNOSIS — M5126 Other intervertebral disc displacement, lumbar region: Secondary | ICD-10-CM | POA: Diagnosis not present

## 2021-03-05 DIAGNOSIS — M47816 Spondylosis without myelopathy or radiculopathy, lumbar region: Secondary | ICD-10-CM | POA: Diagnosis not present

## 2021-03-07 ENCOUNTER — Other Ambulatory Visit (HOSPITAL_BASED_OUTPATIENT_CLINIC_OR_DEPARTMENT_OTHER): Payer: Self-pay

## 2021-03-07 DIAGNOSIS — G4733 Obstructive sleep apnea (adult) (pediatric): Secondary | ICD-10-CM

## 2021-03-07 DIAGNOSIS — G4710 Hypersomnia, unspecified: Secondary | ICD-10-CM

## 2021-03-07 DIAGNOSIS — R0683 Snoring: Secondary | ICD-10-CM

## 2021-03-07 DIAGNOSIS — R0681 Apnea, not elsewhere classified: Secondary | ICD-10-CM

## 2021-03-14 ENCOUNTER — Ambulatory Visit: Payer: HMO | Admitting: Internal Medicine

## 2021-03-14 ENCOUNTER — Encounter: Payer: Self-pay | Admitting: Internal Medicine

## 2021-03-14 ENCOUNTER — Other Ambulatory Visit: Payer: Self-pay

## 2021-03-14 VITALS — BP 124/80 | HR 68 | Temp 98.2°F | Ht 66.0 in | Wt 200.2 lb

## 2021-03-14 DIAGNOSIS — J387 Other diseases of larynx: Secondary | ICD-10-CM

## 2021-03-14 DIAGNOSIS — J849 Interstitial pulmonary disease, unspecified: Secondary | ICD-10-CM | POA: Diagnosis not present

## 2021-03-14 NOTE — Patient Instructions (Addendum)
ILD (interstitial lung disease) (HCC) Dyspnea on exertion  -glad clinically your are stable - last CT was in 2019 and 2021 without change  Plan  - repeat HRCT supine and prone - in 9-12 months  - spirometry and dlco in 9-12 months   Chronic cough Irritable larynx syndrome  - stable / resolved   plan - expectant followup + ENT followup  Followup 9-12 months in 15 min slot but after PFT and HRCT

## 2021-03-14 NOTE — Progress Notes (Signed)
OV 02/17/2011. 65 year old female. Obese. AA female. Remote smoker On cpap for OSA (pmd managing). REferred by Dr Nehemiah Settle.    New visit for chronic cough. Preesent for many years. Insidious onset.  Progressively with more frequency past few years. Cough rated as moderate. Usually dry cough with associated barking quality. Occ mucus present; thick and clear. Cough worsened by milk products, change in weather and dust. Hx of prior ENT eval in [place] > 15 years ago: diagnosed as "vocal cords sticking together" and remote hx of sinus surgery Also diagnosed with GERD in [place] > 15 years ago; takes protonix.  [facility] Reflux Symptom INdex score is 39 and very c/w LPR cough(severe hoarsness of voice, clearing of throat, ecess throat mucus, post nasal drip, coughing after lying down or eating, choking epidoses, sensation of globus with food sticking in thorat) and associated heart burn. Cough so severe that she could not complete spirometry in office today  Has dyspnea too: few years, insidious onset. Progressive. Rates it as moderate. Dyspnea brought on by associated chest tightness and nasal drainage or even exertion like climbing flight of steps. Dyspnea relieved by rest. Cough and dyspnea associated with wheezing and constant yellow sinus drainage as well. Frequent nocturnal awakenings with choking present. Above resulted in asthma diagnosis versus tracheobronchiolotis > 15 years ago: on advair since then which she is unsure is helping. She herself is not convinced she has asthma.   Associated med intake shows tramadol (for shoulder pain and sicatica), fish oil/ flaxseed (2 years) and ACE inhibitor intake (been on lisniopril for > 10 years due to diabetes)   Pas med hx review fromn outside record notes "hx of asthma and vocal cord dysfunction and post nasal drip. Recalls frequent ER visits atleast one per month (last er visit 2 months ago per hx) to [place]. Denies ICU admits, intubations for same.    REC Cough is from ACE Inhibitor, sinus drainage, vocal cord dysfunction, acid reflux and possible cough all conspiring to cause cyclical cough/LPR cough #Sinus drainage  - continue nasal steroid - refer to [facility] ENT Dr. Annalee Genta or Dr Jenne Pane #Possible Acid Reflux  - continue protonix   - take diet sheet from [place] - avoid colas, spices, cheeses, spirits, red meats, beer, chocolates, fried foods etc.,   - sleep with head end of bed elevated  - eat small frequent meals  - do not go to bed for 3 hours after last meal - stop fish oil and flaxseed for now #Asthma   - continue advair for now - might have to consider changing over to something else later depending on ENT evaluation #Vocal Cord Dysfunction - see ENT doctor first  -later  will consider referral to speech therapy with Mr Verdie Mosher #BP  - stop lisinopril - take benicar 1 tablet daily - take sample, nurse will add it in med list #Followup - I will see you in 4-6 weeks.  - Reattempt spirometry with Jerolyn Shin at time of followup -Important you comply with advice 100% correctly   OV 03/31/11: Followup cough. SAw ENT 89/17./12: no anatomic path or VCD noticed at time of exam though patient noted to have choked.  She states that she was not happy with her experience at ENT visit and does not want to visit the same office again. She is compliant with instructions except for diet though she has made improvements there. Off lisniopril. Follows sinus, gerd and astham advice. Does not like advair diskus.  Overall cough is much better. She is happy with improvement. RSI cough score dropped from 39 to 14.  She sses her hoarsenss, clearing and pos nasal drip as mild-moderate  PFTS show no more flow volume loop issues. There is restriction with mixed obstruction. DLCO 67%   REC Cough is from sinus drainage, vocal cord dysfunction, acid reflux and possible cough all conspiring to cause cyclical cough/LPR cough  #Sinus drainage  -  continue nasal steroid - nurse will do script for generic fluticasone -  #Acid Reflux  - continue protonix   - take diet sheet from [place] [facility] - avoid colas, spices, cheeses, spirits, red meats, beer, chocolates, fried foods etc.,   - sleep with head end of bed elevated  - eat small frequent meals  - do not go to bed for 3 hours after last meal - cotninue to avoid fish oil and flaxseed   #Asthma   - continue advair but nurse will give you sample of HFA and teach you how to take it with spacer; she will do script as well  #Vocal Cord Dysfunction - referral to speech therapy with Mr Verdie Mosher  #BP  - will add lisinopril to allergy list - take benicar 1 tablet daily or the equivalent Dr Nehemiah Settle gives you  #Followup - I will see you in 8 weeks.  - -Important you comply with advice 100% correctly - Glad you are much better - Flu shot today please   OV 06/26/2011 Followup for chronic cough. She continues to do better. Subjectively feels much better compared to last visit. Although objectively her cough score has only reduced by 1 point. Today, RSI cough score is 13. Level III postnasal drip. Level to hoarseness of voice, cough after lying down, choking episodes, and annoying cough. Level I sensation of lump in throat and heartburn.  In talking to her she is compliant with her nasal steroid and her Protonix for acid reflux but not compliant with diet for acid reflux. She is on Advair HFA with spacer for presumed asthma and she feels this makes her cough less compared to Advair discus. We discussed more about her asthma history and she says that she was never sure she had asthma diagnosis in [place] it was a presumptive diagnosis. She is eager to come off Advair and give a trial without it. She also gives a history of allergies and allergy shots while living in [place] and she is open to an allergy evaluation right now. In terms of cyclical cough she has finished speech therapy evaluation and  graduated from the program. Overall she feels quality-of-life is acceptable although she would like to see cough improve further. So far we have not tried Neurontin    Past, Family, Social reviewed: no change since last visit except that Dr Eula Listen is new PMD. She now states she has year round allergies. Recollects while in [place] used to take allergy shots twice a week 10 years ago. Since moving to [place] 4 years ago not seen an allergist. Liliane Shi to see allergist. She feels she does not have asthma.  Cough is from sinus drainage, vocal cord dysfunction, acid reflux and possible asthma or allergiesh all working together to cause cyclical cough/LPR cough  #Sinus drainage  - continue nasal steroid - nurse will do script for generic fluticasone  -  #Acid Reflux  - continue protonix  - contniue (REALLY IMPORANT IS DIET) diet sheet from [place] [facility] - avoid colas, spices, cheeses, spirits, red meats, beer,  chocolates, fried foods etc.,  - sleep with head end of bed elevated  - eat small frequent meals  - do not go to bed for 3 hours after last meal  - cotninue to avoid fish oil and flaxseed  #Asthma  - since diagnosis of asthma was in doubt. Please stop Advair and just use albuterol as needed  -We'll do spirometry at followup  # allergies  - Please see Dr. Jetty Duhamel in our office for allergy evaluation; will do referral  #Vocal Cord Dysfunction  - congratulations on graduating from program with speech therapy Mr Verdie Mosher  #BP  - Continue losartan  #Followup  - I will see you in 8 weeks.  - -Important you comply with advice 100% correctly  - Glad you are much better  - Cough score worksheet at followup   OV 08/25/2011 Followup for chronic cough. She is off advair as advised. She is doing gerd control.  Ojectively her cough score is not reduced. Last visit score was 13 but now it is 14. (Level III postnasal drip, and post nasal drip. . Level 2  hoarseness of voice and choking episodes.  Level 1 - cough after lying down,and  sensation of lump in throat and heartburn). Howevefr, subjectively cough much improved. Still with post nasal drip. Has seen Dr Maple Hudson for allergy 08/06/11 and 08/19/11. RAST serum profile negative except for elevated [place] IGE. Allergy skin test pending 09/16/11. On visit 08/19/11 to Dr Maple Hudson had atypical chest pain - releived with ppi in office. EKG okay. Trop was normal. . Of note, Dr Maple Hudson is also addressing her prior OSA issues.   ROS c/o anxiety, insomnia, also chest pains  - atypical  Spirometry  - fev1 1.74L/72%. Ratio 78    #Chest pains  - please see [facility] cardiology asap - > equivocal stress test and but clinically low probability February 2013; placed in clinical followup #Cough  - for sinus: follow Dr Maple Hudson recommendation  - for acid reflux: follow medications and diet  - for possible asthma: stay off advair and once cardiology ok, please have methacholine challenge test  #Followup  - complete cardiology visit and methacholine challenge  - 4-6 weeks from now  09/16/11 with Dr Maple Hudson Allergist- 86 yoF former smoker referred by Dr Marchelle Gearing who has been seeing her for multifactorial cough, questioning an allergic component At last visit she had presented with chest pain, preventing planned allergy skin testing. Now following with cardiology and pending CT scan of her heart/Dr. Excell Seltzer. Still complains of nasal congestion. Able to use her CPAP more regularly when her nose is not stopped up. Unable to do methacholine challenge last month because her baseline is from a tree scores were too low. CPAP autotitration done/[facility]. Pending download. PFT- 09/11/11- FVC 2.15/68%, FEV1 1.59/ 64%, FEV1/FVC 0.80. FEF 25-75 % 0.37/ 15%   OV 03/21/2013 Followup chronic cough  - I have not seen her in nearly 15 months. She says that basically her cough resolved but cough is no longer chronic and only occurs intermittently when she has "bronchitis". In the last year  she's had antibiotics and prednisone for acute episodes of coughing at least 3 times. In between episodes she is asymptomatic. Currently she feels she's having one such episode in the past 3 weeks with chest congestion, cough with white mucus, fatigue and associated wheeze. Currently RSI cough score is 21 and show severe cough.  - Of note, she has not had methacholine challenge test that I advised  a year and half ago. She says that she does not have vocal cord dysfunction based on Dr. Myrtis Hopping exam in August 2012 ENT physician   REC levaquin and pred burst  04/20/2013 Follow up and Med review  new med calendar - pt brought all meds with her today.  does report some sinus pressure, congestion, SOB, wheezing, chest tightness. Is feeling better but still gets into coughing fits that takes her a while to calm down. Strong odors seem to causes attacks along with laughing  She did have an attack in the office. Sats were normal however she has significant upper airway psuedowheezing w/ barking cough.  No fever , chest pain, over reflux , orthopnea or edema.  No using CPAP lately   REC top Advair . Add Chlor trimeton 4mg  2 At bedtime   Change Loratadine 10mg  in am.  Add Pepcid 20mg  At bedtime   Avoid throat clearing , use sips of water.  Saline nasal rinses Twice daily  .  NO MINT PRODUCTS  GERD diet .  We are setting you up for a CT neck .  Restart CPAP At bedtime   follow up Dr. Marchelle Gearing in 4 weeks.  Please contact office for sooner follow up if symptoms do not improve or worsen or seek emergency care    OV  05/26/2013 Chief Complaint  Patient presents with   Follow-up    Pt c/o chest tightness, dry cough, wheezing and sob. Pt states this was some better while she was on prednisone. Last dose x 3 weeks ago.   Followup for chronic cough. She says her last 2-3 weeks that she's having an acute bronchitis flare with yellow phlegm. She feels she needs antibiotics and prednisone. In fact  in the time that she last saw me in September 2014 she's had another round of antibiotics and prednisone with her primary care physician Dr. Eula Listen. She feels her chest is congested although in the office she is clearly having a barking cough, laryngeal spasm and upper airway noise intermittently with changes in voice. It is fairly significant. I reminded her that this is consistent with vocal cord dysfunction but she said that her ear nose throat surgeon cleared her for the same. Of note, she had a CT scan of the neck and this was normal.  07/26/2013 Follow up  2 month follow up - reports some  congestion, head congestion w/ light yellow mucus that is on/off. Has sinus drainage daily . Is not using claritin or chlortrimeton as recommended.  Was taken off ACE in past , now on Cozaar.   RSI cough score unchanged at 21  Had Esophagram on 1/8 w/ mild GERD/HH. No sign delay emptying .  No chest pain, orthopnea, edema , fever.  +hoarseness on/off.   She says she feels so much better with CPAP , using during daytime for naps and At bedtime  . Is amazed at how much she feels better.    OV 03/20/2014  Chief Complaint  Patient presents with   Follow-up    Pt states she is unable to hold her voice as long when she sings. Pt c/o PND and prod cough, pt thinks its d/t allergies. Pt c/o chest soreness on right upper chest.    Followup chronic cough  - I have not seen the patient in nearly 10 months. Overall cough was stable but recently she has had a flareup. I'm not so sure that she is followed instructions compliantly. It took me a  while to understand the medication regimen but it appears that she takes dymista for  Sinuses but saline wash as needed, and not taking claritin, Advair as needed for her lungs, Protonix schedule followup acid reflux but Pepcid only as needed. She is only somewhat compliant with the Neurontin. She continues to have sinus drainage, clearing of the throat and the laryngeal/barking  cough that she feels is coming from the throat. This is associated with dyspnea and is of moderate severity, and some wheezing but she feels wheeze is upper airway  REC #Chronic cough  - I thnk cough is related to voice box issues and loss of neural control of the same - Please take prednisone 40 mg x1 day, then 30 mg x1 day, then 20 mg x1 day, then 10 mg x1 day, and then 5 mg x1 day and stop - refer Dr Barnie Alderman of [facility] for chronic cough - for now continue current nasal, acid reflux and neurontin therapies - continue albuterol as needed ; for now hold off scheduled advair - repeat HRCT chest wo contrast for chronic cough  #FOllowup  3 months - after you see Dr Delford Field of [facility]  Come sooner if there are problems o  OV 10/22/2014  Chief Complaint  Patient presents with   Follow-up    Pt stated her cough has improved. Pt c/o hoarseness and chest tightness. Pt is wearing CPAP nightly and denies any issues with machine, mask or pressure.     FU chronic cough  - IN interim in Sept 2015 had CT chest : CT chest 03/26/14 shows worsening infiltreats in bases compaed to nov 2014. COncern for aspiration and referred  To GI. She does not know details of GI Visit but review of records shows she has gastroparesis and hs been started on reglan. Also she saw Dr Delford Field of ENT in [place]. WIth above measure cough has resolved. /nearly resolved. No respiratory issues.   reports that she quit smoking about 18 years ago. Her smoking use included Cigarettes. She has a 25 pack-year smoking history. She has never used smokeless tobacco.   OV 11/12/2014  Chief Complaint  Patient presents with   Follow-up    Pt here after PFT. Pt stated her breathing has slightly improved,she is less SOB. Pt stated her cough has also imrpoved. Pt has not yet had autoimmune panel.    Follow-up chronic cough with interstitial lung disease findings  As of last visit her cough resolved. Cough was  related to aspiration. As soon as she started following gastroenterology advice cough resolved. However follow-up CT scan of the chest in April 2016 compared to fall 2015 showed persistent ILD findings as described below [personally reviewed image]. At this point she is asymptomatic. I had her do pulmonary function test and autoimmune workup and reports for follow-up. Of these she only completed pulmonary function test that shows restriction with slight reduction in diffusion capacity [some of the restriction can be due to obesity]. She continues to be asymptomatic. She's not having reflux symptoms anymore. Of note, she forgot to have a autoimmune test. She does admit that her daughter has lupus   IMPRESSION: CT chest 10/29/2014 personally reviewed image  1. Stable findings of peribronchovascular ground-glass attenuation, predominantly in the basal portions of the lungs bilaterally, again favored to reflect nonspecific interstitial pneumonia (NSIP). 2. Mild air trapping, indicative of mild small airways disease. Electronically Signed   By: Trudie Reed M.D.   On: 10/29/2014 16:19  Pulmonary function test 10/31/2014 shows restriction with reduced diffusion capacity. FVC 2.1 L/69%, FEV1 1.8 L/75% and ratio of 86. No broncho-dilator response. Total lung capacity 3.5 L/63% and DLCO 20.0/70% Sleep Apnea FOLLOWS FOR: pt wearing cpap nightly, c/o mask getting hot sometimes qhs.  has trouble staying asleep.      OV 06/10/2015  Chief Complaint  Patient presents with   Follow-up    Pt states she has been getting more frequent sinus infections, slight increase in SOB, chest heaviness. Pt here after HRCT. Pt wearing CPAP nightly.    Follow-up multifactorial chronic cough Follow-up mild basal interstitial lung disease not otherwise specified  She currently reports that she is not having much cough although after I left the office I heard a cough and periodically she did have variable upper airway  noise. She continues to have gastroparesis related acid reflux particularly when she eats food she regurgitates. She last saw GI March 2016 and was asked to increase and metoclopramide. She's not having much dyspnea. Overall she feels okay. She is having some atypical chest pain going on for a few months in the right supramammary area   07/16/2015-65 year old female former smoker followed by Dr. Marchelle Gearing for mild ILD, recurrent sinus infections, chronic cough, Sleep Apnea FOLLOWS FOR: pt wearing cpap nightly, c/o mask getting hot sometimes qhs.  has trouble staying asleep.   Here for CPAP follow-up. "Loves it" and says she is wearing it all night every night. Fullface mask, [facility]/12.  Not noticing active respiratory problems currently with little cough    OV  09/18/2015  Chief Complaint  Patient presents with   Follow-up    pt recently seen at [facility] ED for URI- pt c/o prod cough with thick clear mucus, SOB with exertion, wheezing.     Follow-up chronic cough which is multifactorial with a strong component of irritable larynx syndrome, vocal cord dysfunction and gastroparesis related to diabetes  Follow-up interstitial lung disease probably as a result of above but etiology uncertain   I last saw her in November 2016. She was supposed to follow-up with me in November 2017. In December 2016 I did make her see GI. They did a swallow study. I reviewed those results and she did not aspirate but coughed significantly during swallowing. No specific interventions have been recommended. It appears she has increase and metoclopramide. Then 09/01/2015 ended up in the ER with shortness of breath and wheezing episode and was given antibiotics and prednisone according to her history. Now she significantly improved and close to baseline. She does have some wheezing but she says it's all from the upper airway. She does not want another course of anabiotic's of prednisone. She is on Neurontin but this is not  helping. Her creatinine was 1 mg percent troponin was normal and hemoglobin was normal 09/01/2015. She had chest x-ray 09/01/2015 and is clear per report. I did not visualize this film.  In terms of her vocal cord dysfunction review of my prior notes shows that she is seen ENT multiple times. Most recently she saw [facility] ENT Dr. Delford Field and she had resolution but now the cough due to irritable larynx has come back. She says she cannot go back to [facility] because of insurance reasons. She is willing to reestablish with the ENT locally.   OV 06/15/2016  Chief Complaint  Patient presents with   Follow-up    Pt states she is waking every 2 hours because she is SOB, pt is still wearing CPAP.  Pt c/o prod cough with clear mucus. Pt states she just completed a pred taper and abx for URI.      Follow-up multifactorial chronic cough Follow-up mild basal interstitial lung disease not otherwise specified   Obese female with significant vocal cord dysfunction/irritable larynx syndrome returns for follow-up. Last seen March 2017. She had follow-up high-resolution CT chest 06/09/2016 that I personally visualized. The findings show extremely subtle presence of interstitial lung disease all stable since 2014. In terms of symptoms of cough she is overall stable. However she tells me she has significant episodes of dyspnea at night when she wears a CPAP. Here in the office when asked her to open her mouth she made it given the significant upper airway coughing spell. She also complains of dyspnea on exertion.   OV 08/14/2016  Chief Complaint  Patient presents with   Follow-up    Was told to follow up after stress test. Stress test was done back in Dec 2017. Breathing has ok for the past few weeks, had a minor URI.      65 y.o. Marland KitchenLAVONNE KINDERMAN presents for fu of CPST results in 07/01/16. Reviewed terst results with her. Somewhat submaximal of RER 0.96. Audible wheezing prior to start of test. No EIB.  Has vo2 max that went from low normal to normal when correced for IBW. Not clear if there was diast dysfn. Got dyspneic  OV 02/16/2017  Chief Complaint  Patient presents with   Follow-up    Pt states she thinks her breathing has recently worsened d/t bronchitis. Pt states she is taking pred. Pt c/o non prod cough, chest discomfort from coughing, chest congestion. Pt deines f/c/s.     Follow-up multifactorial chronic cough with vcd Follow-up mild basal interstitial lung disease not otherwise specified   This is a routine follow-up for the above issues but 2 days ago she ended up in the ER because of sinus drainage bronchospasm. According to her history she was discharged with 10 days of amoxicillin and 5 days of prednisone. She is much improved although she still complaining of chest tightness in throat tightness and cough. She also some increased wheezing than baseline but all improving. There are no other new issues. Labs and chest x-ray from the ER visit review reviewed and documented below.   OV 08/20/17  Chief Complaint  Patient presents with   Follow-up    HRCT done 08/19/17.  Pt states the SOB is a little worse since last visit and pt does have some coughing with occ. thick mucus and congestion due to sinus problems   Follow-up multifactorial chronic cough with severe irritable larynx/VCD  follow-up mild basal interstitial lung disease that has been stable for many years.    Overall stable.  In the interim she has had shoulder surgery.  She complains of class 3/2 dyspnea on exertion that is stable.  Her weight is unchanged.  No new issues. Walking desaturation test on 08/20/2017 185 feet x 3 laps on ROOM AIR:  did NOT desaturate. Rest pulse ox was 100%, final pulse ox was 100%. HR response 68/min at rest to 91/min at peak exertion. Patient ARIEANNA PRESSEY  Did not Desaturate < 88% . Lorina Rabon did not  Desaturated </= 3% points. Lorina Rabon yes did get tachyardic CT scan of the  chest showed stability.     IMPRESSION: HRCT 1. There are very subtle changes in the lung bases which are again suggestive of interstitial lung disease. The  CT pattern is indeterminate for usual interstitial pneumonia (UIP), and given the stability compared to 2017, and the presence of air trapping, is rather favored to reflect mild nonspecific interstitial pneumonia (NSIP). 2. Tracheobronchomalacia. 3. Aortic atherosclerosis, in addition to left circumflex coronary artery disease. Please note that although the presence of coronary artery calcium documents the presence of coronary artery disease, the severity of this disease and any potential stenosis cannot be assessed on this non-gated CT examination. Assessment for potential risk factor modification, dietary therapy or pharmacologic therapy may be warranted, if clinically indicated.   Aortic Atherosclerosis (ICD10-I70.0).     Electronically Signed   By: Trudie Reed M.D.   On: 08/19/2017 12:56   OV 02/05/2020  Subjective:  Patient ID: Lavona Mound, female , DOB: 24-Feb-1956 , age 67 y.o. , MRN: [id number] , ADDRESS: [address] [place] [address]   02/05/2020 -   Chief Complaint  Patient presents with   Follow-up    Pt states she has been doing okay since last visit. Denies any real complaints of SOB or coughing. Pt states that she has been seeing her PCP when needed.   Follow-up multifactorial chronic cough with severe irritable larynx/VCD  follow-up mild basal interstitial lung disease that has been stable for many years.    Auriel Kist 65 y.o. -returns for follow-up of the above medical problems.  Not seen her since 2019.  She is supposed to have seen me in 2020 but because of the pandemic of COVID-19 she did not see me.  She says she has had a Covid vaccine.  She has been doing stable.  She uses a CPAP.  Therefore her vocal cord wheezing is somewhat stable although persistent and mild.  Her  dyspnea on exertion is mild but still present.  She thinks overall maybe she is a little bit better.  Definitely no new medical issues.  No new medications.  No complications in her health so far.  On examination today when she opened her mouth we could hear wheezing coming from her upper airways.  When she closed about this wheezing went away.  When she took a deep breath this wheezing came back.  She could not cooperate with exam nitric oxide testing  Narrative & Impression  CLINICAL DATA:  Shortness of breath on exertion. Chronic bronchitis.   EXAM: CT CHEST WITHOUT CONTRAST   TECHNIQUE: Multidetector CT imaging of the chest was performed following the standard protocol without intravenous contrast. High resolution imaging of the lungs, as well as inspiratory and expiratory imaging, was performed.   COMPARISON:  08/19/2017.   FINDINGS: Cardiovascular: Atherosclerotic calcification of the aorta and coronary arteries. Heart size normal. No pericardial effusion.   Mediastinum/Nodes: Mediastinal and axillary lymph nodes are not enlarged by CT size criteria. Hilar regions are difficult to definitively evaluate without IV contrast. Distal esophageal wall thickening can be seen with gastroesophageal reflux. Small hiatal hernia.   Lungs/Pleura: Mild central bronchiectasis. Negative for subpleural reticulation, traction bronchiectasis/bronchiolectasis, architectural distortion or honeycombing. Vague peribronchovascular ground-glass nodularity in the lower lobes, similar to 08/19/2017. No pleural fluid. Airway is unremarkable. Mild air trapping.   Upper Abdomen: Liver appears slightly heterogeneous in attenuation. Subcentimeter low-attenuation lesion in the left hepatic lobe is too small to characterize. Visualized portions of the liver, gallbladder, adrenal glands, kidneys, spleen, pancreas, stomach and bowel are grossly unremarkable.   Musculoskeletal: Degenerative changes in the  spine. No worrisome lytic or sclerotic lesions.   IMPRESSION: 1. Bilateral lower lobe  peribronchovascular ground-glass nodularity is unchanged from 08/19/2017 and may be postinfectious in etiology. Difficult to exclude fibrotic nonspecific interstitial pneumonitis. Findings are suggestive of an alternative diagnosis (not UIP) per consensus guidelines: Diagnosis of Idiopathic Pulmonary Fibrosis: An Official ATS/ERS/JRS/ALAT Clinical Practice Guideline. Am Rosezetta Schlatter Crit Care Med Vol 198, Iss 5, [phone], Mar 13 2017. 2. Mild central bronchiectasis. Mild air trapping is indicative of small airways disease. 3. Liver appears heterogeneous, raising suspicion for steatosis. 4. Aortic atherosclerosis (ICD10-I70.0). Coronary artery calcification.     Electronically Signed   By: Leanna Battles M.D.   On: 02/09/2020 10:59     ROS - per HPI   OV 03/14/2021  Subjective:  Patient ID: Lavona Mound, female , DOB: 04-01-1956 , age 30 y.o. , MRN: [id number] , ADDRESS: [address] [place] [address] PCP Georgann Housekeeper, MD Patient Care Team: Georgann Housekeeper, MD as PCP - General (Internal Medicine) Kalman Shan, MD as Consulting Physician (Pulmonary Disease)  This Provider for this visit: Treatment Team:  Attending Provider: Kalman Shan, MD    03/14/2021 -   Chief Complaint  Patient presents with   Follow-up    Pt states she has been doing okay since last visit. States that she has had complaints of chest tightness. Denies any problems with her breathing.    Follow-up multifactorial chronic cough with severe irritable larynx/VCD  follow-up mild basal interstitial lung disease that has been stable for many years. -Alternative pattern.  Most recent CT July 2021 stable since 2019  HPI Tsering Zailynn Brandel 65 y.o. -presents for 1 year follow-up.  She continues to be stable.  Symptom burden is mild.  Walking desaturation test is stable.  She feels she has lost some  weight but she is not sure if it has been lost intentionally.  In the last 1 year she says she is got some hernia in her back.  She is dealing with hypertension and also low sugars.  Otherwise from a pulmonary standpoint she is stable.  Cough is no longer an issue.   SYMPTOM SCALE - ILD 03/14/2021   O2 use ra  Shortness of Breath 0 -> 5 scale with 5 being worst (score 6 If unable to do)  At rest 0  Simple tasks - showers, clothes change, eating, shaving 1  Household (dishes, doing bed, laundry) 2  Shopping 1  Walking level at own pace 1  Walking up Stairs 3  Total (30-36) Dyspnea Score 9  How bad is your cough? 1.5  How bad is your fatigue 1  How bad is nausea 0  How bad is vomiting?  0  How bad is diarrhea? 0  How bad is anxiety? 1  How bad is depression 1       Simple office walk 185 feet x  3 laps goal with forehead probe 2/8/2-019  03/14/2021   O2 used ra ra  Number laps completed x 3  Comments about pace x avg  Resting Pulse Ox/HR 100% and 68/min 100% and 68  Final Pulse Ox/HR 100% and 91/min 98% and 86  Desaturated </= 88% x no  Desaturated <= 3% points Xno no  Got Tachycardic >/= 90/min yes no  Symptoms at end of test x Moderate dyspnea  Miscellaneous comments x x    PFT  PFT Results Latest Ref Rng & Units 11/08/2014  FVC-Pre L 2.10  FVC-Predicted Pre % 69  FVC-Post L 2.06  FVC-Predicted Post % 67  Pre FEV1/FVC % % 86  Post FEV1/FCV % % 89  FEV1-Pre L 1.81  FEV1-Predicted Pre % 75  FEV1-Post L 1.84  DLCO uncorrected ml/min/mmHg 20.01  DLCO UNC% % 70  DLVA Predicted % 112  TLC L 3.51  TLC % Predicted % 63  RV % Predicted % 61       has a past medical history of Anxiety, Arthritis, Asthma, Blood transfusion without reported diagnosis, Chronic bronchitis (HCC), Cold extremity without peripheral vascular disease, COPD (chronic obstructive pulmonary disease) (HCC), Environmental allergies, Exertional dyspnea, Gastroparesis, GERD (gastroesophageal reflux  disease), Headache(784.0), Heart murmur, Hiatal hernia, High cholesterol, HTN (hypertension), Obesity, Rotator cuff tear, Sciatic nerve pain, Sciatica, Sleep apnea, Type II diabetes mellitus (HCC), and Vocal cord dysfunction.   reports that she quit smoking about 24 years ago. Her smoking use included cigarettes. She has a 25.00 pack-year smoking history. She has never used smokeless tobacco.  Past Surgical History:  Procedure Laterality Date   ABDOMINAL ADHESION SURGERY  ~ 1978   APPENDECTOMY  1976   NASAL SINUS SURGERY  1992   OOPHORECTOMY  1976   w/ovarian cyst excision and appy   SHOULDER ARTHROSCOPY W/ ROTATOR CUFF REPAIR  01/28/12   left   TOE SURGERY     for ingrown [id number] ?left   TOTAL ABDOMINAL HYSTERECTOMY  1985    Allergies  Allergen Reactions   Lisinopril Cough    Immunization History  Administered Date(s) Administered   Influenza Split 03/31/2011, 05/19/2013, 03/13/2014, 05/16/2015, 04/26/2017   Influenza,inj,Quad PF,6+ Mos 03/13/2016, 03/29/2019   Moderna Sars-Covid-2 Vaccination 09/30/2019, 10/28/2019   Pneumococcal Polysaccharide-23 07/14/2001   Td 07/15/1998   Tdap 10/11/2016   Zoster Recombinat (Shingrix) 05/03/2017   Zoster, Live 11/10/2016    Family History  Problem Relation Age of Onset   Heart disease Mother    Asthma Mother    Heart failure Maternal Grandmother    Asthma Son    Asthma Daughter    Lupus Daughter    Diabetes Other        "everybody on both sides"   Colon cancer Neg Hx    Throat cancer Neg Hx    Stomach cancer Neg Hx    Esophageal cancer Neg Hx    Rectal cancer Neg Hx      Current Outpatient Medications:    albuterol (PROVENTIL HFA;VENTOLIN HFA) 108 (90 BASE) MCG/ACT inhaler, Inhale 2 puffs into the lungs every 4 (four) hours as needed for wheezing or shortness of breath. , Disp: , Rfl:    ALPRAZolam (XANAX) 0.5 MG tablet, Take 0.5 mg by mouth 2 (two) times daily as needed for anxiety. , Disp: , Rfl:    aspirin 81 MG  tablet, Take 81 mg by mouth 2 (two) times a week. , Disp: , Rfl:    atorvastatin (LIPITOR) 20 MG tablet, Take 20 mg by mouth every evening. , Disp: , Rfl:    Azelastine-Fluticasone 137-50 MCG/ACT SUSP, Place 1 spray into both nostrils daily as needed (for congestion). , Disp: , Rfl:    citalopram (CELEXA) 10 MG tablet, Take 20 mg by mouth every evening. , Disp: , Rfl:    fluticasone (FLONASE) 50 MCG/ACT nasal spray, Place 2 sprays into both nostrils daily., Disp: 16 g, Rfl: 2   gabapentin (NEURONTIN) 300 MG capsule, Take 300 mg by mouth at bedtime. , Disp: , Rfl:    LANTUS SOLOSTAR 100 UNIT/ML Solostar Pen, Inject into the skin., Disp: , Rfl:    linaclotide (LINZESS) 290 MCG CAPS capsule, Take 1 capsule (290  mcg total) by mouth daily before breakfast., Disp: 90 capsule, Rfl: 3   loratadine (CLARITIN) 10 MG tablet, Take 10 mg by mouth daily as needed for allergies. , Disp: , Rfl:    losartan (COZAAR) 100 MG tablet, Take 100 mg by mouth daily. , Disp: , Rfl:    metFORMIN (GLUCOPHAGE) 1000 MG tablet, Take 1,000 mg by mouth 2 (two) times daily. , Disp: , Rfl:    metoCLOPramide (REGLAN) 5 MG tablet, TAKE 1 TABLET BY MOUTH BEFORE MEALS AND AT BEDTIME AS NEEDED, Disp: 90 tablet, Rfl: 0   pantoprazole (PROTONIX) 40 MG tablet, Take 1 tablet (40 mg total) by mouth 2 (two) times daily. (Patient taking differently: Take 40 mg by mouth daily.), Disp: 60 tablet, Rfl: 3   traMADol (ULTRAM) 50 MG tablet, Take 50 mg by mouth every 6 (six) hours as needed for pain. , Disp: , Rfl:    VICTOZA 18 MG/3ML SOPN, , Disp: , Rfl:    zolpidem (AMBIEN) 5 MG tablet, Take 5 mg by mouth at bedtime., Disp: , Rfl: 3      Objective:   Vitals:   03/14/21 1549  BP: 124/80  Pulse: 68  Temp: 98.2 F (36.8 C)  TempSrc: Oral  SpO2: 100%  Weight: 200 lb 3.2 oz (90.8 kg)  Height: 5\' 6"  (1.676 m)    Estimated body mass index is 32.31 kg/m as calculated from the following:   Height as of this encounter: 5\' 6"  (1.676 m).    Weight as of this encounter: 200 lb 3.2 oz (90.8 kg).  @WEIGHTCHANGE @    03/14/21 1549  Weight: 200 lb 3.2 oz (90.8 kg)     Physical Exam    General: No distress. Obeese . Look swell Neuro: Alert and Oriented x 3. GCS 15. Speech normal Psych: Pleasant Resp:  Barrel Chest - no.  Wheeze - no, Crackles - Possible crackles faint at the base, No overt respiratory distress CVS: Normal heart sounds. Murmurs - no Ext: Stigmata of Connective Tissue Disease - no HEENT: Normal upper airway. PEERL +. No post nasal drip        Assessment:       ICD-10-CM   1. Interstitial pulmonary disease (HCC)  J84.9 CT Chest High Resolution    Pulmonary function test    2. Irritable larynx syndrome  J38.7          Plan:     Patient Instructions  ILD (interstitial lung disease) (HCC) Dyspnea on exertion  -glad clinically your are stable - last CT was in 2019 and 2021 without change  Plan  - repeat HRCT supine and prone - in 9-12 months  - spirometry and dlco in 9-12 months   Chronic cough Irritable larynx syndrome  - stable / resolved   plan - expectant followup + ENT followup  Followup 9-12 months in 15 min slot but after PFT and HRCT    SIGNATURE    Dr. 2022, M.D., F.C.C.P,  Pulmonary and Critical Care Medicine Staff Physician, [facility] Center Director - Interstitial Lung Disease  Program  Pulmonary Fibrosis [facility] at [facility] [place], [facility], [facility]  Pager: [phone], If no answer or between  15:00h - 7:00h: call [phone] Telephone: [phone]  4:35 PM 03/14/2021

## 2021-04-02 ENCOUNTER — Ambulatory Visit
Admission: RE | Admit: 2021-04-02 | Discharge: 2021-04-02 | Disposition: A | Discharge status: Home / Self Care | Payer: HMO | Source: Ambulatory Visit | Attending: Internal Medicine | Admitting: Internal Medicine

## 2021-04-02 ENCOUNTER — Other Ambulatory Visit: Payer: Self-pay

## 2021-04-02 ENCOUNTER — Ambulatory Visit: Payer: HMO

## 2021-04-02 DIAGNOSIS — Z1231 Encounter for screening mammogram for malignant neoplasm of breast: Secondary | ICD-10-CM

## 2021-05-13 DIAGNOSIS — J383 Other diseases of vocal cords: Secondary | ICD-10-CM | POA: Diagnosis not present

## 2021-05-13 DIAGNOSIS — I700 Atherosclerosis of aorta: Secondary | ICD-10-CM | POA: Diagnosis not present

## 2021-05-13 DIAGNOSIS — E1140 Type 2 diabetes mellitus with diabetic neuropathy, unspecified: Secondary | ICD-10-CM | POA: Diagnosis not present

## 2021-05-13 DIAGNOSIS — E782 Mixed hyperlipidemia: Secondary | ICD-10-CM | POA: Diagnosis not present

## 2021-05-13 DIAGNOSIS — I10 Essential (primary) hypertension: Secondary | ICD-10-CM | POA: Diagnosis not present

## 2021-05-13 DIAGNOSIS — J449 Chronic obstructive pulmonary disease, unspecified: Secondary | ICD-10-CM | POA: Diagnosis not present

## 2021-05-13 DIAGNOSIS — G4733 Obstructive sleep apnea (adult) (pediatric): Secondary | ICD-10-CM | POA: Diagnosis not present

## 2021-05-13 DIAGNOSIS — F419 Anxiety disorder, unspecified: Secondary | ICD-10-CM | POA: Diagnosis not present

## 2021-07-01 DIAGNOSIS — J441 Chronic obstructive pulmonary disease with (acute) exacerbation: Secondary | ICD-10-CM | POA: Diagnosis not present

## 2021-08-14 DIAGNOSIS — J383 Other diseases of vocal cords: Secondary | ICD-10-CM | POA: Diagnosis not present

## 2021-08-14 DIAGNOSIS — I10 Essential (primary) hypertension: Secondary | ICD-10-CM | POA: Diagnosis not present

## 2021-08-14 DIAGNOSIS — J309 Allergic rhinitis, unspecified: Secondary | ICD-10-CM | POA: Diagnosis not present

## 2021-08-14 DIAGNOSIS — K3184 Gastroparesis: Secondary | ICD-10-CM | POA: Diagnosis not present

## 2021-08-14 DIAGNOSIS — F419 Anxiety disorder, unspecified: Secondary | ICD-10-CM | POA: Diagnosis not present

## 2021-08-14 DIAGNOSIS — E782 Mixed hyperlipidemia: Secondary | ICD-10-CM | POA: Diagnosis not present

## 2021-08-14 DIAGNOSIS — M519 Unspecified thoracic, thoracolumbar and lumbosacral intervertebral disc disorder: Secondary | ICD-10-CM | POA: Diagnosis not present

## 2021-08-14 DIAGNOSIS — K5900 Constipation, unspecified: Secondary | ICD-10-CM | POA: Diagnosis not present

## 2021-08-14 DIAGNOSIS — J449 Chronic obstructive pulmonary disease, unspecified: Secondary | ICD-10-CM | POA: Diagnosis not present

## 2021-08-14 DIAGNOSIS — E1140 Type 2 diabetes mellitus with diabetic neuropathy, unspecified: Secondary | ICD-10-CM | POA: Diagnosis not present

## 2021-08-14 DIAGNOSIS — G4730 Sleep apnea, unspecified: Secondary | ICD-10-CM | POA: Diagnosis not present

## 2021-08-14 DIAGNOSIS — I700 Atherosclerosis of aorta: Secondary | ICD-10-CM | POA: Diagnosis not present

## 2021-10-07 DIAGNOSIS — E669 Obesity, unspecified: Secondary | ICD-10-CM | POA: Diagnosis not present

## 2021-10-07 DIAGNOSIS — G4733 Obstructive sleep apnea (adult) (pediatric): Secondary | ICD-10-CM | POA: Diagnosis not present

## 2021-10-07 DIAGNOSIS — E1140 Type 2 diabetes mellitus with diabetic neuropathy, unspecified: Secondary | ICD-10-CM | POA: Diagnosis not present

## 2021-10-07 DIAGNOSIS — I10 Essential (primary) hypertension: Secondary | ICD-10-CM | POA: Diagnosis not present

## 2021-10-30 ENCOUNTER — Encounter (HOSPITAL_BASED_OUTPATIENT_CLINIC_OR_DEPARTMENT_OTHER): Payer: HMO | Admitting: Sleep Medicine

## 2021-11-17 DIAGNOSIS — E1140 Type 2 diabetes mellitus with diabetic neuropathy, unspecified: Secondary | ICD-10-CM | POA: Diagnosis not present

## 2021-11-17 DIAGNOSIS — Z1331 Encounter for screening for depression: Secondary | ICD-10-CM | POA: Diagnosis not present

## 2021-11-17 DIAGNOSIS — K5900 Constipation, unspecified: Secondary | ICD-10-CM | POA: Diagnosis not present

## 2021-11-17 DIAGNOSIS — J383 Other diseases of vocal cords: Secondary | ICD-10-CM | POA: Diagnosis not present

## 2021-11-17 DIAGNOSIS — K219 Gastro-esophageal reflux disease without esophagitis: Secondary | ICD-10-CM | POA: Diagnosis not present

## 2021-11-17 DIAGNOSIS — K3184 Gastroparesis: Secondary | ICD-10-CM | POA: Diagnosis not present

## 2021-11-17 DIAGNOSIS — F419 Anxiety disorder, unspecified: Secondary | ICD-10-CM | POA: Diagnosis not present

## 2021-11-17 DIAGNOSIS — G4700 Insomnia, unspecified: Secondary | ICD-10-CM | POA: Diagnosis not present

## 2021-11-17 DIAGNOSIS — E782 Mixed hyperlipidemia: Secondary | ICD-10-CM | POA: Diagnosis not present

## 2021-11-17 DIAGNOSIS — Z0000 Encounter for general adult medical examination without abnormal findings: Secondary | ICD-10-CM | POA: Diagnosis not present

## 2021-11-17 DIAGNOSIS — I10 Essential (primary) hypertension: Secondary | ICD-10-CM | POA: Diagnosis not present

## 2021-11-17 DIAGNOSIS — I700 Atherosclerosis of aorta: Secondary | ICD-10-CM | POA: Diagnosis not present

## 2021-11-17 DIAGNOSIS — J449 Chronic obstructive pulmonary disease, unspecified: Secondary | ICD-10-CM | POA: Diagnosis not present

## 2021-11-18 DIAGNOSIS — E1140 Type 2 diabetes mellitus with diabetic neuropathy, unspecified: Secondary | ICD-10-CM | POA: Diagnosis not present

## 2021-11-18 DIAGNOSIS — E669 Obesity, unspecified: Secondary | ICD-10-CM | POA: Diagnosis not present

## 2021-11-18 DIAGNOSIS — G4733 Obstructive sleep apnea (adult) (pediatric): Secondary | ICD-10-CM | POA: Diagnosis not present

## 2021-11-18 DIAGNOSIS — I10 Essential (primary) hypertension: Secondary | ICD-10-CM | POA: Diagnosis not present

## 2021-12-04 DIAGNOSIS — G4733 Obstructive sleep apnea (adult) (pediatric): Secondary | ICD-10-CM | POA: Diagnosis not present

## 2022-01-04 DIAGNOSIS — G4733 Obstructive sleep apnea (adult) (pediatric): Secondary | ICD-10-CM | POA: Diagnosis not present

## 2022-02-02 DIAGNOSIS — Z1211 Encounter for screening for malignant neoplasm of colon: Secondary | ICD-10-CM | POA: Diagnosis not present

## 2022-02-02 DIAGNOSIS — R1011 Right upper quadrant pain: Secondary | ICD-10-CM | POA: Diagnosis not present

## 2022-02-02 DIAGNOSIS — K5901 Slow transit constipation: Secondary | ICD-10-CM | POA: Diagnosis not present

## 2022-02-03 DIAGNOSIS — G4733 Obstructive sleep apnea (adult) (pediatric): Secondary | ICD-10-CM | POA: Diagnosis not present

## 2022-02-06 DIAGNOSIS — K8020 Calculus of gallbladder without cholecystitis without obstruction: Secondary | ICD-10-CM | POA: Diagnosis not present

## 2022-02-06 DIAGNOSIS — R1011 Right upper quadrant pain: Secondary | ICD-10-CM | POA: Diagnosis not present

## 2022-02-06 DIAGNOSIS — K7689 Other specified diseases of liver: Secondary | ICD-10-CM | POA: Diagnosis not present

## 2022-02-10 DIAGNOSIS — K8020 Calculus of gallbladder without cholecystitis without obstruction: Secondary | ICD-10-CM | POA: Diagnosis not present

## 2022-02-12 ENCOUNTER — Ambulatory Visit (HOSPITAL_COMMUNITY)
Admission: RE | Admit: 2022-02-12 | Discharge: 2022-02-12 | Disposition: A | Discharge status: Home / Self Care | Payer: HMO | Source: Ambulatory Visit | Attending: Internal Medicine | Admitting: Internal Medicine

## 2022-02-12 DIAGNOSIS — J479 Bronchiectasis, uncomplicated: Secondary | ICD-10-CM | POA: Insufficient documentation

## 2022-02-12 DIAGNOSIS — J849 Interstitial pulmonary disease, unspecified: Secondary | ICD-10-CM

## 2022-02-12 DIAGNOSIS — K449 Diaphragmatic hernia without obstruction or gangrene: Secondary | ICD-10-CM | POA: Diagnosis not present

## 2022-02-12 DIAGNOSIS — J398 Other specified diseases of upper respiratory tract: Secondary | ICD-10-CM | POA: Diagnosis not present

## 2022-02-12 DIAGNOSIS — I700 Atherosclerosis of aorta: Secondary | ICD-10-CM | POA: Insufficient documentation

## 2022-02-12 DIAGNOSIS — R55 Syncope and collapse: Secondary | ICD-10-CM | POA: Diagnosis not present

## 2022-02-12 DIAGNOSIS — I2510 Atherosclerotic heart disease of native coronary artery without angina pectoris: Secondary | ICD-10-CM | POA: Diagnosis not present

## 2022-02-13 DIAGNOSIS — G4733 Obstructive sleep apnea (adult) (pediatric): Secondary | ICD-10-CM | POA: Diagnosis not present

## 2022-02-19 NOTE — Progress Notes (Signed)
CT chest without change x 1 year . Please give first avail followup with APP/MD

## 2022-02-23 DIAGNOSIS — H10413 Chronic giant papillary conjunctivitis, bilateral: Secondary | ICD-10-CM | POA: Diagnosis not present

## 2022-02-23 DIAGNOSIS — H0102B Squamous blepharitis left eye, upper and lower eyelids: Secondary | ICD-10-CM | POA: Diagnosis not present

## 2022-02-23 DIAGNOSIS — H43811 Vitreous degeneration, right eye: Secondary | ICD-10-CM | POA: Diagnosis not present

## 2022-02-23 DIAGNOSIS — E119 Type 2 diabetes mellitus without complications: Secondary | ICD-10-CM | POA: Diagnosis not present

## 2022-02-23 DIAGNOSIS — H2513 Age-related nuclear cataract, bilateral: Secondary | ICD-10-CM | POA: Diagnosis not present

## 2022-02-23 DIAGNOSIS — H0102A Squamous blepharitis right eye, upper and lower eyelids: Secondary | ICD-10-CM | POA: Diagnosis not present

## 2022-02-23 DIAGNOSIS — H40013 Open angle with borderline findings, low risk, bilateral: Secondary | ICD-10-CM | POA: Diagnosis not present

## 2022-02-24 DIAGNOSIS — G4733 Obstructive sleep apnea (adult) (pediatric): Secondary | ICD-10-CM | POA: Diagnosis not present

## 2022-02-24 DIAGNOSIS — I10 Essential (primary) hypertension: Secondary | ICD-10-CM | POA: Diagnosis not present

## 2022-02-24 DIAGNOSIS — E669 Obesity, unspecified: Secondary | ICD-10-CM | POA: Diagnosis not present

## 2022-02-24 DIAGNOSIS — E1140 Type 2 diabetes mellitus with diabetic neuropathy, unspecified: Secondary | ICD-10-CM | POA: Diagnosis not present

## 2022-03-04 ENCOUNTER — Ambulatory Visit: Payer: HMO | Admitting: Primary Care

## 2022-03-05 ENCOUNTER — Encounter: Payer: Self-pay | Admitting: Adult Health

## 2022-03-05 ENCOUNTER — Ambulatory Visit: Payer: HMO | Admitting: Adult Health

## 2022-03-05 VITALS — BP 122/78 | HR 62 | Ht 67.0 in | Wt 207.6 lb

## 2022-03-05 DIAGNOSIS — J387 Other diseases of larynx: Secondary | ICD-10-CM

## 2022-03-05 DIAGNOSIS — J398 Other specified diseases of upper respiratory tract: Secondary | ICD-10-CM | POA: Diagnosis not present

## 2022-03-05 DIAGNOSIS — J849 Interstitial pulmonary disease, unspecified: Secondary | ICD-10-CM | POA: Diagnosis not present

## 2022-03-05 DIAGNOSIS — G4733 Obstructive sleep apnea (adult) (pediatric): Secondary | ICD-10-CM

## 2022-03-05 NOTE — Patient Instructions (Addendum)
Saline nasal spray Twice daily   Begin Allegra 180mg  daily in am As needed  drainage  Add Chlorpheniramine 4mg  At bedtime  as needed drainage.  Continue on Protonix daily  Albuterol inhaler As needed   Continue on CPAP At bedtime  and with naps.  Robitussin DM 1-2 tsp every 4hrs as needed for cough  Tessalon Three times a day As needed  cough .  Refer to [facility] for Tracheobronchomalacia Follow up with Dr. in 3 months and As needed   Please contact office for sooner follow up if symptoms do not improve or worsen or seek emergency care

## 2022-03-05 NOTE — Progress Notes (Signed)
@Patient  ID: , female    DOB: 1956/04/02, 66 y.o.   MRN: [id number]  Chief Complaint  Patient presents with   Follow-up    Referring provider: [id number], MD  HPI: 65 year old female followed for chronic cough/irritable larynx syndrome, abnormal CT chest and obstructive sleep apnea  TEST/EVENTS :  High-resolution CT chest February 12, 2022 shows no evidence of interstitial lung disease, was noted some mild bronchiectasis, positive tracheobrochomalacia , groundglass nodularity in the bilateral lobes not significantly changed likely sequela of previous infection  03/05/2022 Follow up : Chronic cough, irritable larynx syndrome, abnormal CT chest, obstructive sleep apnea Patient presents for a 1 year follow-up.  Patient has a multifactorial chronic cough with severe irritable larynx and vocal cord dysfunction.  Patient says over the last year she has been doing okay.  She has episodes where she has severe coughing paroxysms and upper airway wheezing.  She has had a few occasions where she has had to receive steroids.  She says it really does not help that much and makes her blood sugars go up.  She has had an abnormal CT chest in the past that showed mild basilar interstitial lung changes.  High-resolution CT chest done on February 12, 2022 showed no evidence of interstitial lung disease.  Was some mild bronchiectasis.  There was tracheobronchomalacia.  Groundglass nodularity bilateral lobes not significantly changed felt to be a sequela of previous infection.  Patient does have some postnasal drainage.  Patient says her episodes where she will have a severe coughing fit are quite scary at times.  Seems to be worse after prolonged talking and sometimes eating.  Or changes in the weather. Patient says she has underlying sleep apnea.  Wears her CPAP each night.  Patient says she is doing well.  Feels that she benefits from CPAP. Download shows 87% compliance.  Daily average usage around  5 hours.  Patient is on CPAP auto 4 to 16 cm H2O.  AHI 1.9/hour.   Allergies  Allergen Reactions   Lisinopril Cough    Immunization History  Administered Date(s) Administered   Influenza Split 03/31/2011, 05/19/2013, 03/13/2014, 05/16/2015, 04/26/2017   Influenza,inj,Quad PF,6+ Mos 03/13/2016, 03/29/2019   Moderna Sars-Covid-2 Vaccination 09/30/2019, 10/28/2019   Pneumococcal Polysaccharide-23 07/14/2001   Td 07/15/1998   Tdap 10/11/2016   Zoster Recombinat (Shingrix) 05/03/2017   Zoster, Live 11/10/2016    Past Medical History:  Diagnosis Date   Anxiety    Arthritis    "both shoulders"   Asthma    Blood transfusion without reported diagnosis    Chronic bronchitis (HCC)    Cold extremity without peripheral vascular disease    bilaterally; "from my knees down into my feet; they get ice cold"   COPD (chronic obstructive pulmonary disease) (HCC)    Environmental allergies    "year round"   Exertional dyspnea    Gastroparesis    GERD (gastroesophageal reflux disease)    Headache(784.0)    Heart murmur    Hiatal hernia    High cholesterol    HTN (hypertension)    Obesity    Rotator cuff tear    Sciatic nerve pain    "tailbone down into my legs when it flares up"   Sciatica    Sleep apnea    CPAP, sleep study on [place]   Type II diabetes mellitus (HCC)    Vocal cord dysfunction    "I've had it for many years"    Tobacco History: Social History  Tobacco Use  Smoking Status Former   Packs/day: 1.00   Years: 25.00   Total pack years: 25.00   Types: Cigarettes   Quit date: 07/13/1996   Years since quitting: 25.6  Smokeless Tobacco Never   Counseling given: Not Answered   Outpatient Medications Prior to Visit  Medication Sig Dispense Refill   albuterol (PROVENTIL HFA;VENTOLIN HFA) 108 (90 BASE) MCG/ACT inhaler Inhale 2 puffs into the lungs every 4 (four) hours as needed for wheezing or shortness of breath.      ALPRAZolam (XANAX) 0.5 MG tablet Take 0.5 mg by  mouth 2 (two) times daily as needed for anxiety.      aspirin 81 MG tablet Take 81 mg by mouth 2 (two) times a week.      atorvastatin (LIPITOR) 20 MG tablet Take 20 mg by mouth every evening.      Azelastine-Fluticasone 137-50 MCG/ACT SUSP Place 1 spray into both nostrils daily as needed (for congestion).      citalopram (CELEXA) 10 MG tablet Take 20 mg by mouth every evening.      fluticasone (FLONASE) 50 MCG/ACT nasal spray Place 2 sprays into both nostrils daily. 16 g 2   gabapentin (NEURONTIN) 300 MG capsule Take 300 mg by mouth at bedtime.      LANTUS SOLOSTAR 100 UNIT/ML Solostar Pen Inject into the skin.     linaclotide (LINZESS) 290 MCG CAPS capsule Take 1 capsule (290 mcg total) by mouth daily before breakfast. 90 capsule 3   loratadine (CLARITIN) 10 MG tablet Take 10 mg by mouth daily as needed for allergies.      losartan (COZAAR) 100 MG tablet Take 100 mg by mouth daily.      metFORMIN (GLUCOPHAGE) 1000 MG tablet Take 1,000 mg by mouth 2 (two) times daily.      metoCLOPramide (REGLAN) 5 MG tablet TAKE 1 TABLET BY MOUTH BEFORE MEALS AND AT BEDTIME AS NEEDED 90 tablet 0   pantoprazole (PROTONIX) 40 MG tablet Take 1 tablet (40 mg total) by mouth 2 (two) times daily. (Patient taking differently: Take 40 mg by mouth daily.) 60 tablet 3   traMADol (ULTRAM) 50 MG tablet Take 50 mg by mouth every 6 (six) hours as needed for pain.      VICTOZA 18 MG/3ML SOPN      zolpidem (AMBIEN) 5 MG tablet Take 5 mg by mouth at bedtime.  3   No facility-administered medications prior to visit.     Review of Systems:   Constitutional:   No  weight loss, night sweats,  Fevers, chills, fatigue, or  lassitude.  HEENT:   No headaches,  Difficulty swallowing,  Tooth/dental problems, or  Sore throat,                No sneezing, itching, ear ache,  +nasal congestion, post nasal drip,   CV:  No chest pain,  Orthopnea, PND, swelling in lower extremities, anasarca, dizziness, palpitations, syncope.   GI   No heartburn, indigestion, abdominal pain, nausea, vomiting, diarrhea, change in bowel habits, loss of appetite, bloody stools.   Resp:   No chest wall deformity  Skin: no rash or lesions.  GU: no dysuria, change in color of urine, no urgency or frequency.  No flank pain, no hematuria   MS:  No joint pain or swelling.  No decreased range of motion.  No back pain.    Physical Exam  BP 122/78 (BP Location: Left Arm, Cuff Size: Normal)   Pulse 62  Ht 5\' 7"  (1.702 m)   Wt 207 lb 9.6 oz (94.2 kg)   SpO2 96%   BMI 32.51 kg/m   GEN: A/Ox3; pleasant , NAD, well nourished    HEENT:  [place]/AT,   NOSE-clear, THROAT-clear, no lesions, no postnasal drip or exudate noted.  Upper airway pseudo wheeze noted  NECK:  Supple w/ fair ROM; no JVD; normal carotid impulses w/o bruits; no thyromegaly or nodules palpated; no lymphadenopathy.    RESP  Clear  P & A; w/o, wheezes/ rales/ or rhonchi. no accessory muscle use, no dullness to percussion  CARD:  RRR, no m/r/g, no peripheral edema, pulses intact, no cyanosis or clubbing.  GI:   Soft & nt; nml bowel sounds; no organomegaly or masses detected.   Musco: Warm bil, no deformities or joint swelling noted.   Neuro: alert, no focal deficits noted.    Skin: Warm, no lesions or rashes      BNP No results found for: "BNP"  ProBNP    Component Value Date/Time   PROBNP <30.0 09/27/2008 0007    Imaging: CT Chest High Resolution  Result Date: 02/13/2022 CLINICAL DATA:  Interstitial lung disease follow-up EXAM: CT CHEST WITHOUT CONTRAST TECHNIQUE: Multidetector CT imaging of the chest was performed following the standard protocol without intravenous contrast. High resolution imaging of the lungs, as well as inspiratory and expiratory imaging, was performed. RADIATION DOSE REDUCTION: This exam was performed according to the departmental dose-optimization program which includes automated exposure control, adjustment of the mA and/or kV according to  patient size and/or use of iterative reconstruction technique. COMPARISON:  Chest CT dated February 08, 2020 FINDINGS: Cardiovascular: Normal heart size. No pericardial effusion. Normal caliber thoracic aorta with mild calcified plaque. Mild coronary artery calcifications. Mediastinum/Nodes: Small hiatal hernia. Thyroid is unremarkable. No pathologically enlarged lymph nodes seen in the chest. Lungs/Pleura: Central airways are patent. Mild central cylindrical bronchiectasis, unchanged when compared with prior exam. Posterior bowing of the trachea is seen on inspiratory phase imaging with collapse of the lower trachea and proximal main bronchi on expiratory phase imaging. Mild air trapping. No reticular opacities, traction bronchiectasis, or honeycomb change. No consolidation, pleural effusion or pneumothorax. Subtle Peribronchovascular ground-glass nodularity is again seen in the bilateral lower lobes and not significantly changed when compared with prior exam. Upper Abdomen: No acute abnormality. Musculoskeletal: No chest wall mass or suspicious bone lesions identified. IMPRESSION: 1. No evidence of fibrotic interstitial lung disease. 2. Unchanged mild central cylindrical bronchiectasis. 3. 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 4. Peribronchovascular ground-glass nodularity is again seen in the bilateral lower lobes and not significantly changed compared with prior exam, likely sequela of prior infection. 5. Coronary artery calcifications and aortic Atherosclerosis (ICD10-I70.0). Electronically Signed   By: Yetta Glassman M.D.   On: 02/13/2022 10:04         Latest Ref Rng & Units 11/08/2014    3:01 PM  PFT Results  FVC-Pre L 2.10   FVC-Predicted Pre % 69   FVC-Post L 2.06   FVC-Predicted Post % 67   Pre FEV1/FVC % % 86   Post FEV1/FCV % % 89   FEV1-Pre L 1.81   FEV1-Predicted Pre % 75   FEV1-Post L 1.84   DLCO uncorrected  ml/min/mmHg 20.01   DLCO UNC% % 70   DLVA Predicted % 112   TLC L 3.51   TLC % Predicted % 63   RV % Predicted % 61  No results found for: "NITRICOXIDE"      Assessment & Plan:   No problem-specific Assessment & Plan notes found for this encounter.     Rexene Edison, NP 03/05/2022

## 2022-03-09 ENCOUNTER — Other Ambulatory Visit: Payer: Self-pay | Admitting: General Surgery

## 2022-03-09 DIAGNOSIS — J398 Other specified diseases of upper respiratory tract: Secondary | ICD-10-CM | POA: Insufficient documentation

## 2022-03-09 DIAGNOSIS — K811 Chronic cholecystitis: Secondary | ICD-10-CM | POA: Diagnosis not present

## 2022-03-09 DIAGNOSIS — K8010 Calculus of gallbladder with chronic cholecystitis without obstruction: Secondary | ICD-10-CM | POA: Diagnosis not present

## 2022-03-09 NOTE — Assessment & Plan Note (Signed)
Continue on CPAP at bedtime 

## 2022-03-09 NOTE — Assessment & Plan Note (Signed)
Recent high-resolution CT chest shows no evidence of interstitial lung disease.

## 2022-03-09 NOTE — Assessment & Plan Note (Signed)
Upper airway cough syndrome, chronic cough.-Control for triggers.  Add in regimen for postnasal drainage/chronic rhinitis.  Continue on GERD treatment.  Cough control regimen.  Plan  Patient Instructions  Saline nasal spray Twice daily   Begin Allegra 180mg  daily in am As needed  drainage  Add Chlorpheniramine 4mg  At bedtime  as needed drainage.  Continue on Protonix daily  Albuterol inhaler As needed   Continue on CPAP At bedtime  and with naps.  Robitussin DM 1-2 tsp every 4hrs as needed for cough  Tessalon Three times a day As needed  cough .  Refer to [facility] for Tracheobronchomalacia Follow up with Dr. in 3 months and As needed   Please contact office for sooner follow up if symptoms do not improve or worsen or seek emergency care

## 2022-03-09 NOTE — Assessment & Plan Note (Signed)
Severe episodes of coughing.  Recent CT chest shows tracheobronchomalacia.  Will refer to [facility] for further evaluation and possible treatment plan

## 2022-03-12 ENCOUNTER — Encounter: Payer: Self-pay | Admitting: Internal Medicine

## 2022-03-12 ENCOUNTER — Telehealth: Payer: Self-pay | Admitting: Adult Health

## 2022-03-12 DIAGNOSIS — J398 Other specified diseases of upper respiratory tract: Secondary | ICD-10-CM

## 2022-03-12 NOTE — Telephone Encounter (Signed)
Will need to let TP know and new referral will need to be placed to wherever she wants the pt to be seen.

## 2022-03-12 NOTE — Telephone Encounter (Signed)
Do we know if her insurance will cover [facility] or [facility]?

## 2022-03-12 NOTE — Telephone Encounter (Signed)
Message routed to PCC's to assist  

## 2022-03-12 NOTE — Telephone Encounter (Signed)
Robin from [facility] called to inform the nurse or doctor that the pulmonary referral that was sent for patient is not covered under [facility] because they are not in Network.  Patient has [employer] and [facility] does not cover that.  Please advise and call Robin to discuss and also let the patient know.  CB# [phone].

## 2022-03-17 ENCOUNTER — Other Ambulatory Visit: Payer: Self-pay | Admitting: Internal Medicine

## 2022-03-17 DIAGNOSIS — Z1231 Encounter for screening mammogram for malignant neoplasm of breast: Secondary | ICD-10-CM

## 2022-03-17 DIAGNOSIS — G4733 Obstructive sleep apnea (adult) (pediatric): Secondary | ICD-10-CM | POA: Diagnosis not present

## 2022-03-18 NOTE — Telephone Encounter (Signed)
Referral placed to [facility]. Nothing further needed.

## 2022-03-18 NOTE — Telephone Encounter (Signed)
Send to [facility] for Tracheobronchomalacia

## 2022-03-18 NOTE — Telephone Encounter (Signed)
Tammy, please advise on this if you want [place] to refer pt to either [facility] or [facility].

## 2022-03-18 NOTE — Telephone Encounter (Signed)
I spoke to pt to make her aware [facility] is not in -network.  She is going to call her insurance to see if they are in-network with [facility] or [facility] & will call me back.

## 2022-03-18 NOTE — Telephone Encounter (Signed)
Pt called me back and states [facility] and [facility] are in-network with her insurance.  Original order had [facility] - will need new order to send to whoever TP would like to use.

## 2022-03-20 ENCOUNTER — Encounter: Payer: Self-pay | Admitting: Internal Medicine

## 2022-04-06 DIAGNOSIS — G4733 Obstructive sleep apnea (adult) (pediatric): Secondary | ICD-10-CM | POA: Diagnosis not present

## 2022-04-08 ENCOUNTER — Ambulatory Visit
Admission: RE | Admit: 2022-04-08 | Discharge: 2022-04-08 | Disposition: A | Discharge status: Home / Self Care | Payer: HMO | Source: Ambulatory Visit | Attending: Internal Medicine | Admitting: Internal Medicine

## 2022-04-08 DIAGNOSIS — Z1231 Encounter for screening mammogram for malignant neoplasm of breast: Secondary | ICD-10-CM

## 2022-04-09 ENCOUNTER — Ambulatory Visit (AMBULATORY_SURGERY_CENTER): Payer: Self-pay | Admitting: *Deleted

## 2022-04-09 VITALS — Ht 67.0 in | Wt 215.2 lb

## 2022-04-09 DIAGNOSIS — Z1211 Encounter for screening for malignant neoplasm of colon: Secondary | ICD-10-CM

## 2022-04-09 MED ORDER — PEG 3350-KCL-NA BICARB-NACL 420 G PO SOLR: 4000.0000 mL | Freq: Once | ORAL | 0 refills | Status: AC

## 2022-04-09 NOTE — Progress Notes (Signed)
No egg or soy allergy known to patient  No issues known to pt with past sedation with any surgeries or procedures Patient denies ever being told they had issues or difficulty with intubation  No FH of Malignant Hyperthermia Pt is not on diet pills Pt is not on  home 02  Pt is not on blood thinners  Pt has issues with constipation, 2 DAY PREP GIVEN.  No A fib or A flutter Have any cardiac testing pending--no Pt instructed to use Singlecare.com or GoodRx for a price reduction on prep

## 2022-04-22 ENCOUNTER — Encounter: Payer: Self-pay | Admitting: Internal Medicine

## 2022-04-30 ENCOUNTER — Ambulatory Visit: Payer: HMO | Admitting: Internal Medicine

## 2022-04-30 DIAGNOSIS — Z1211 Encounter for screening for malignant neoplasm of colon: Secondary | ICD-10-CM

## 2022-04-30 NOTE — Progress Notes (Signed)
Pt had 2 day prep. Drank Miralax on Tuesday per orders. Took 6 dulcolax tablets on Wednesday and only drank half of the Golytely. States she did not finish it because she could not tolerate. Pt did not drink any liquids this morning. Pt had BM and RN assessed. It was yellow, mushy consistency in the bottom of the toilet, not quite liquid. MD made aware. Rescheduled pt for 05/01/22 at 9:30am with Dr. Lorenso Courier. Pt given instructions for split dose Miralax as she was able to tolerate that prep. RN reviewed instructions in detail and stressed the importance of drinking as much fluids as possible to help hydrate and help clean out the colon. Pt verbalized understanding. Instructed pt to call number provided on instructions if she has any difficulties with the prep.

## 2022-05-01 ENCOUNTER — Ambulatory Visit (AMBULATORY_SURGERY_CENTER): Payer: HMO | Admitting: Internal Medicine

## 2022-05-01 ENCOUNTER — Encounter: Payer: Self-pay | Admitting: Internal Medicine

## 2022-05-01 VITALS — BP 104/72 | HR 58 | Temp 98.8°F | Resp 14 | Ht 67.0 in | Wt 215.0 lb

## 2022-05-01 DIAGNOSIS — Z1211 Encounter for screening for malignant neoplasm of colon: Secondary | ICD-10-CM | POA: Diagnosis not present

## 2022-05-01 MED ORDER — SODIUM CHLORIDE 0.9 % IV SOLN: 500.0000 mL | INTRAVENOUS | Status: DC

## 2022-05-01 NOTE — Op Note (Signed)
[place] Patient Name: [name] Procedure Date: 05/01/2022 9:57 AM MRN: [id number] Endoscopist: Sonny Masters "[name] ,  Age: 66 Referring MD:  Date of Birth: January 02, 1956 Gender: Female Account #: [id number] Procedure:                Colonoscopy Indications:              Screening for colorectal malignant neoplasm Medicines:                Monitored Anesthesia Care Procedure:                Pre-Anesthesia Assessment:                           - Prior to the procedure, a History and Physical                            was performed, and patient medications and                            allergies were reviewed. The patient's tolerance of                            previous anesthesia was also reviewed. The risks                            and benefits of the procedure and the sedation                            options and risks were discussed with the patient.                            All questions were answered, and informed consent                            was obtained. Prior Anticoagulants: The patient has                            taken no previous anticoagulant or antiplatelet                            agents. ASA Grade Assessment: III - A patient with                            severe systemic disease. After reviewing the risks                            and benefits, the patient was deemed in                            satisfactory condition to undergo the procedure.                           After obtaining informed consent, the colonoscope  was passed under direct vision. Throughout the                            procedure, the patient's blood pressure, pulse, and                            oxygen saturations were monitored continuously. The                            CF [id number] was introduced through the anus                            and advanced to the the cecum, identified by                            appendiceal  orifice and ileocecal valve. The                            colonoscopy was performed without difficulty. The                            patient tolerated the procedure well. The quality                            of the bowel preparation was good. The ileocecal                            valve, appendiceal orifice, and rectum were                            photographed. Scope In: 10:02:01 AM Scope Out: 10:22:45 AM Scope Withdrawal Time: 0 hours 12 minutes 45 seconds  Total Procedure Duration: 0 hours 20 minutes 44 seconds  Findings:                 The terminal ileum appeared normal.                           There was a small lipoma, in the transverse colon.                           Non-bleeding internal hemorrhoids were found during                            retroflexion. Complications:            No immediate complications. Estimated Blood Loss:     Estimated blood loss: none. Impression:               - The examined portion of the ileum was normal.                           - Small lipoma in the transverse colon.                           - Non-bleeding internal hemorrhoids.                           -  No specimens collected. Recommendation:           - Discharge patient to home (with escort).                           - Depending on your age and medical condition,                            consider repeat colonoscopy in 10 years for                            screening purposes.                           - The findings and recommendations were discussed                            with the patient. Dr Georgian Co "[place]" [place],  05/01/2022 10:27:25 AM

## 2022-05-01 NOTE — Patient Instructions (Signed)
-   Discharge patient to home (with escort). - Depending on your age and medical condition, consider repeat colonoscopy in 10 years for screening purposes. - The findings and recommendations were discussed with the patient.  YOU HAD AN ENDOSCOPIC PROCEDURE TODAY AT [facility]:   Refer to the procedure report that was given to you for any specific questions about what was found during the examination.  If the procedure report does not answer your questions, please call your gastroenterologist to clarify.  If you requested that your care partner not be given the details of your procedure findings, then the procedure report has been included in a sealed envelope for you to review at your convenience later.  YOU SHOULD EXPECT: Some feelings of bloating in the abdomen. Passage of more gas than usual.  Walking can help get rid of the air that was put into your GI tract during the procedure and reduce the bloating. If you had a lower endoscopy (such as a colonoscopy or flexible sigmoidoscopy) you may notice spotting of blood in your stool or on the toilet paper. If you underwent a bowel prep for your procedure, you may not have a normal bowel movement for a few days.  Please Note:  You might notice some irritation and congestion in your nose or some drainage.  This is from the oxygen used during your procedure.  There is no need for concern and it should clear up in a day or so.  SYMPTOMS TO REPORT IMMEDIATELY:  Following lower endoscopy (colonoscopy or flexible sigmoidoscopy):  Excessive amounts of blood in the stool  Significant tenderness or worsening of abdominal pains  Swelling of the abdomen that is new, acute  Fever of 100F or higher   For urgent or emergent issues, a gastroenterologist can be reached at any hour by calling [phone]. Do not use MyChart messaging for urgent concerns.    DIET:  We do recommend a small meal at first, but then you may proceed to your  regular diet.  Drink plenty of fluids but you should avoid alcoholic beverages for 24 hours.  ACTIVITY:  You should plan to take it easy for the rest of today and you should NOT DRIVE or use heavy machinery until tomorrow (because of the sedation medicines used during the test).    FOLLOW UP: Our staff will call the number listed on your records the next business day following your procedure.  We will call around 7:15- 8:00 am to check on you and address any questions or concerns that you may have regarding the information given to you following your procedure. If we do not reach you, we will leave a message.     If any biopsies were taken you will be contacted by phone or by letter within the next 1-3 weeks.  Please call us at [phone] if you have not heard about the biopsies in 3 weeks.    SIGNATURES/CONFIDENTIALITY: You and/or your care partner have signed paperwork which will be entered into your electronic medical record.  These signatures attest to the fact that that the information above on your After Visit Summary has been reviewed and is understood.  Full responsibility of the confidentiality of this discharge information lies with you and/or your care-partner.

## 2022-05-01 NOTE — Progress Notes (Signed)
PT taken to PACU. Monitors in place. VSS. Report given to RN. 

## 2022-05-01 NOTE — Progress Notes (Signed)
Pt's states no medical or surgical changes since previsit or office visit. 

## 2022-05-01 NOTE — Progress Notes (Signed)
GASTROENTEROLOGY PROCEDURE H&P NOTE   Primary Care Physician: Wenda Low, MD    Reason for Procedure:   Colon cancer screening  Plan:    Colonoscopy  Patient is appropriate for endoscopic procedure(s) in the ambulatory ([place]) setting.  The nature of the procedure, as well as the risks, benefits, and alternatives were carefully and thoroughly reviewed with the patient. Ample time for discussion and questions allowed. The patient understood, was satisfied, and agreed to proceed.     HPI: [name] is a 65 y.o. female who presents for colonoscopy for colon cancer screening. Denies blood in stools, changes in bowel habits, weight loss. Denies family history of colon cancer. Last colonoscopy was 10 years that was normal.  Past Medical History:  Diagnosis Date   Allergy    seasonal   Anxiety    Arthritis    "both shoulders"   Asthma    Blood transfusion without reported diagnosis    Cataract    beginning   Chronic bronchitis ([place])    Cold extremity without peripheral vascular disease    bilaterally; "from my knees down into my feet; they get ice cold"   COPD (chronic obstructive pulmonary disease) ([place])    Environmental allergies    "year round"   Exertional dyspnea    Gastroparesis    GERD (gastroesophageal reflux disease)    Headache(784.0)    Heart murmur    Hiatal hernia    High cholesterol    HTN (hypertension)    Obesity    Rotator cuff tear    Sciatic nerve pain    "tailbone down into my legs when it flares up"   Sciatica    Sleep apnea    CPAP, sleep study on [place]   Type II diabetes mellitus ([place])    Vocal cord dysfunction    "I've had it for many years"    Past Surgical History:  Procedure Laterality Date   ABDOMINAL ADHESION SURGERY  ~ 1978   APPENDECTOMY  07/13/1974   CHOLECYSTECTOMY     COLONOSCOPY     NASAL SINUS SURGERY  07/13/1990   OOPHORECTOMY  07/13/1974   w/ovarian cyst excision and appy   SHOULDER ARTHROSCOPY W/ ROTATOR  CUFF REPAIR Bilateral 01/28/2012   left   TOE SURGERY     for ingrown [id number] ?left   TOTAL ABDOMINAL HYSTERECTOMY  07/14/1983    Prior to Admission medications   Medication Sig Start Date End Date Taking? Authorizing Provider  ALPRAZolam Duanne Moron) 0.5 MG tablet Take 0.5 mg by mouth 2 (two) times daily as needed for anxiety.    Yes [provider]  amLODipine (NORVASC) 5 MG tablet SMARTSIG:1 Tablet(s) By Mouth Every Evening 01/30/22  Yes [provider]  atorvastatin (LIPITOR) 20 MG tablet Take 20 mg by mouth every evening.    Yes [provider]  citalopram (CELEXA) 10 MG tablet Take 20 mg by mouth every evening.  05/08/13  Yes [provider]  Continuous Blood Gluc Sensor (FREESTYLE LIBRE 14 DAY SENSOR) MISC Apply topically as directed. 03/28/22  Yes [provider]  gabapentin (NEURONTIN) 300 MG capsule Take 300 mg by mouth at bedtime.  05/08/13  Yes [provider]  LANTUS SOLOSTAR 100 UNIT/ML Solostar Pen Inject into the skin. 03/10/21  Yes [provider]  losartan (COZAAR) 100 MG tablet Take 100 mg by mouth daily.    Yes [provider]  metFORMIN (GLUCOPHAGE) 1000 MG tablet Take 1,000 mg by mouth 2 (two) times  daily.  04/15/13  Yes [provider]  metoCLOPramide (REGLAN) 5 MG tablet TAKE 1 TABLET BY MOUTH BEFORE MEALS AND AT BEDTIME AS NEEDED 07/28/18  Yes Pyrtle, Lajuan Lines, MD  pantoprazole (PROTONIX) 40 MG tablet Take 1 tablet (40 mg total) by mouth 2 (two) times daily. Patient taking differently: Take 40 mg by mouth daily. 06/17/15  Yes Hvozdovic, Lori P, PA-C  albuterol (PROVENTIL HFA;VENTOLIN HFA) 108 (90 BASE) MCG/ACT inhaler Inhale 2 puffs into the lungs every 4 (four) hours as needed for wheezing or shortness of breath.  Patient not taking: Reported on 04/09/2022    [provider]  aspirin 81 MG tablet Take 81 mg by mouth 2 (two) times a week.  Patient not taking: Reported on 04/09/2022     [provider]  Azelastine-Fluticasone 137-50 MCG/ACT SUSP Place 1 spray into both nostrils daily as needed (for congestion).  Patient not taking: Reported on 04/09/2022    [provider]  fluticasone (FLONASE) 50 MCG/ACT nasal spray Place 2 sprays into both nostrils daily. Patient not taking: Reported on 04/09/2022 11/24/17   Raiford Noble Latif, DO  linaclotide [facility]) 290 MCG CAPS capsule Take 1 capsule (290 mcg total) by mouth daily before breakfast. Patient not taking: Reported on 05/01/2022 10/28/17   Jerene Bears, MD  loratadine (CLARITIN) 10 MG tablet Take 10 mg by mouth daily as needed for allergies.     [provider]  traMADol (ULTRAM) 50 MG tablet Take 50 mg by mouth every 6 (six) hours as needed for pain.     [provider]  VICTOZA 18 MG/3ML SOPN  02/19/21   [provider]  zolpidem (AMBIEN) 5 MG tablet Take 5 mg by mouth at bedtime. Patient not taking: Reported on 04/09/2022 11/09/17   [provider]    Current Outpatient Medications  Medication Sig Dispense Refill   ALPRAZolam (XANAX) 0.5 MG tablet Take 0.5 mg by mouth 2 (two) times daily as needed for anxiety.      amLODipine (NORVASC) 5 MG tablet SMARTSIG:1 Tablet(s) By Mouth Every Evening     atorvastatin (LIPITOR) 20 MG tablet Take 20 mg by mouth every evening.      citalopram (CELEXA) 10 MG tablet Take 20 mg by mouth every evening.      Continuous Blood Gluc Sensor (FREESTYLE LIBRE 14 DAY SENSOR) MISC Apply topically as directed.     gabapentin (NEURONTIN) 300 MG capsule Take 300 mg by mouth at bedtime.      LANTUS SOLOSTAR 100 UNIT/ML Solostar Pen Inject into the skin.     losartan (COZAAR) 100 MG tablet Take 100 mg by mouth daily.      metFORMIN (GLUCOPHAGE) 1000 MG tablet Take 1,000 mg by mouth 2 (two) times daily.      metoCLOPramide (REGLAN) 5 MG tablet TAKE 1 TABLET BY MOUTH BEFORE MEALS AND AT BEDTIME AS NEEDED 90 tablet 0   pantoprazole (PROTONIX) 40 MG  tablet Take 1 tablet (40 mg total) by mouth 2 (two) times daily. (Patient taking differently: Take 40 mg by mouth daily.) 60 tablet 3   albuterol (PROVENTIL HFA;VENTOLIN HFA) 108 (90 BASE) MCG/ACT inhaler Inhale 2 puffs into the lungs every 4 (four) hours as needed for wheezing or shortness of breath.  (Patient not taking: Reported on 04/09/2022)     aspirin 81 MG tablet Take 81 mg by mouth 2 (two) times a week.  (Patient not taking: Reported on 04/09/2022)     Azelastine-Fluticasone 137-50 MCG/ACT SUSP  Place 1 spray into both nostrils daily as needed (for congestion).  (Patient not taking: Reported on 04/09/2022)     fluticasone (FLONASE) 50 MCG/ACT nasal spray Place 2 sprays into both nostrils daily. (Patient not taking: Reported on 04/09/2022) 16 g 2   linaclotide (LINZESS) 290 MCG CAPS capsule Take 1 capsule (290 mcg total) by mouth daily before breakfast. (Patient not taking: Reported on 05/01/2022) 90 capsule 3   loratadine (CLARITIN) 10 MG tablet Take 10 mg by mouth daily as needed for allergies.      traMADol (ULTRAM) 50 MG tablet Take 50 mg by mouth every 6 (six) hours as needed for pain.      VICTOZA 18 MG/3ML SOPN      zolpidem (AMBIEN) 5 MG tablet Take 5 mg by mouth at bedtime. (Patient not taking: Reported on 04/09/2022)  3   Current Facility-Administered Medications  Medication Dose Route Frequency Provider Last Rate Last Admin   0.9 %  sodium chloride infusion  500 mL Intravenous Continuous Sharyn Creamer, MD        Allergies as of 05/01/2022 - Review Complete 05/01/2022  Allergen Reaction Noted   Lisinopril Cough 03/31/2011    Family History  Problem Relation Age of Onset   Heart disease Mother    Asthma Mother    Heart failure Maternal Grandmother    Asthma Daughter    Lupus Daughter    Asthma Son    Diabetes Other        "everybody on both sides"   Colon cancer Neg Hx    Throat cancer Neg Hx    Stomach cancer Neg Hx    Esophageal cancer Neg Hx    Rectal cancer Neg Hx     Colon polyps Neg Hx    Crohn's disease Neg Hx    Ulcerative colitis Neg Hx     Social History   Socioeconomic History   Marital status: Married    Spouse name: Not on file   Number of children: Not on file   Years of education: Not on file   Highest education level: Not on file  Occupational History   Occupation: unemployed  Tobacco Use   Smoking status: Former    Packs/day: 1.00    Years: 25.00    Total pack years: 25.00    Types: Cigarettes    Quit date: 07/13/1996    Years since quitting: 25.8    Passive exposure: Past   Smokeless tobacco: Never  Vaping Use   Vaping Use: Never used  Substance and Sexual Activity   Alcohol use: No    Comment: 01/29/12 "used to abuse alcohol; last drink was in the 1980's"   Drug use: No   Sexual activity: Yes  Other Topics Concern   Not on file  Social History Narrative   Not on file   Social Determinants of Health   Financial Resource Strain: Not on file  Food Insecurity: Not on file  Transportation Needs: Not on file  Physical Activity: Not on file  Stress: Not on file  Social Connections: Not on file  Intimate Partner Violence: Not on file    Physical Exam: Vital signs in last 24 hours: BP (!) 119/57   Pulse 65   Temp 98.8 F (37.1 C)   Ht 5\' 7"  (1.702 m)   Wt 215 lb (97.5 kg)   SpO2 99%   BMI 33.67 kg/m  GEN: NAD EYE: Sclerae anicteric ENT: MMM CV: Non-tachycardic Pulm: No increased work of  breathing GI: Soft, NT/ND NEURO:  Alert & Oriented   Christia Reading, MD [facility]  05/01/2022 9:48 AM

## 2022-05-04 ENCOUNTER — Telehealth: Payer: Self-pay

## 2022-05-04 NOTE — Telephone Encounter (Signed)
  Follow up Call-     05/01/2022    9:35 AM  Call back number  Post procedure Call Back phone  # [phone]  Permission to leave phone message Yes     Patient questions:  Do you have a fever, pain , or abdominal swelling? No. Pain Score  0 *  Have you tolerated food without any problems? Yes.    Have you been able to return to your normal activities? Yes.    Do you have any questions about your discharge instructions: Diet   No. Medications  No. Follow up visit  No.  Do you have questions or concerns about your Care? No.  Actions: * If pain score is 4 or above: No action needed, pain <4.

## 2022-06-16 NOTE — Progress Notes (Addendum)
Cardiology Office Note:    Date:  06/17/2022   ID:  Fritzi Mandes, DOB [birth year], MRN [id number]  PCP:  Wenda Low, MD  Cardiologist:  None   Referring MD: Wenda Low, MD   Chief Complaint  Patient presents with   Coronary Artery Disease    History of Present Illness:    Sharitta Sampley is a 66 y.o. female with a hx of hyperlipidemia, diabetes mellitus, hypertension, sleep apnea, obesity, who is referred for evaluation of chest tightness.  Previously seen by Dr. Burt Knack in 2013.  Prior cardiac workup in 2013 by Dr. Burt Knack did not reveal evidence of ischemia/significant coronary disease based upon noninvasive imaging.  Referred by Dr. Benita Stabile for palpitations, chest discomfort, and concerned about the possibility of arrhythmia.  She describes having congestion in her nasal and upper airways that causes her to have difficulty breathing which awakens her suddenly and on occasion she notices that her heart rate is high for a while until the breathing situation improves.  She does not always have the heart racing with these episodes of congestion and difficulty breathing.  States that episodes of rapid heart beating might occur 2 or 3 times per quarter.  Feels that if she wears a monitor there is a good chance that she will not have an episode.  She denies exertional chest discomfort.  In reviewing her chart she does have evidence of coronary artery calcification and aortic atherosclerosis.  Remote smoking history, obstructive sleep apnea, diabetes mellitus, family history CAD (mother and father had CABG), hypertension, and hyperlipidemia.  Past Medical History:  Diagnosis Date   Allergy    seasonal   Anxiety    Arthritis    "both shoulders"   Asthma    Blood transfusion without reported diagnosis    Cataract    beginning   Chronic bronchitis ([place])    Cold extremity without peripheral vascular disease    bilaterally; "from my knees down into my feet; they  get ice cold"   COPD (chronic obstructive pulmonary disease) ([place])    Environmental allergies    "year round"   Exertional dyspnea    Gastroparesis    GERD (gastroesophageal reflux disease)    Headache(784.0)    Heart murmur    Hiatal hernia    High cholesterol    HTN (hypertension)    Obesity    Rotator cuff tear    Sciatic nerve pain    "tailbone down into my legs when it flares up"   Sciatica    Sleep apnea    CPAP, sleep study on [facility]   Type II diabetes mellitus ([place])    Vocal cord dysfunction    "I've had it for many years"    Past Surgical History:  Procedure Laterality Date   ABDOMINAL ADHESION SURGERY  ~ 1978   APPENDECTOMY  07/13/1974   CHOLECYSTECTOMY     COLONOSCOPY     NASAL SINUS SURGERY  07/13/1990   OOPHORECTOMY  07/13/1974   w/ovarian cyst excision and appy   SHOULDER ARTHROSCOPY W/ ROTATOR CUFF REPAIR Bilateral 01/28/2012   left   TOE SURGERY     for ingrown [id number] ?left   TOTAL ABDOMINAL HYSTERECTOMY  07/14/1983    Current Medications: Current Meds  Medication Sig   albuterol (PROVENTIL HFA;VENTOLIN HFA) 108 (90 BASE) MCG/ACT inhaler Inhale 2 puffs into the lungs every 4 (four) hours as needed for wheezing or shortness of breath.   ALPRAZolam (XANAX) 0.5 MG tablet Take 0.5 mg  by mouth 2 (two) times daily as needed for anxiety.    amLODipine (NORVASC) 5 MG tablet SMARTSIG:1 Tablet(s) By Mouth Every Evening   aspirin 81 MG tablet Take 81 mg by mouth 2 (two) times a week.   atorvastatin (LIPITOR) 20 MG tablet Take 20 mg by mouth every evening.    Azelastine-Fluticasone 137-50 MCG/ACT SUSP Place 1 spray into both nostrils daily as needed (for congestion).   citalopram (CELEXA) 10 MG tablet Take 20 mg by mouth every evening.    Continuous Blood Gluc Sensor (FREESTYLE LIBRE 14 DAY SENSOR) MISC Apply topically as directed.   doxycycline (VIBRA-TABS) 100 MG tablet Take 100 mg by mouth 2 (two) times daily.   fluticasone (FLONASE) 50 MCG/ACT nasal  spray Place 2 sprays into both nostrils daily.   gabapentin (NEURONTIN) 300 MG capsule Take 300 mg by mouth 2 (two) times daily.   LANTUS SOLOSTAR 100 UNIT/ML Solostar Pen Inject into the skin.   linaclotide (LINZESS) 290 MCG CAPS capsule Take 1 capsule (290 mcg total) by mouth daily before breakfast.   loratadine (CLARITIN) 10 MG tablet Take 10 mg by mouth daily as needed for allergies.    losartan (COZAAR) 100 MG tablet Take 100 mg by mouth daily.    metFORMIN (GLUCOPHAGE) 1000 MG tablet Take 1,000 mg by mouth 2 (two) times daily.    metoCLOPramide (REGLAN) 5 MG tablet TAKE 1 TABLET BY MOUTH BEFORE MEALS AND AT BEDTIME AS NEEDED (Patient taking differently: Take 5 mg by mouth 2 (two) times daily. Take 1 tablet by mouth before meals and at bedtime as needed)   pantoprazole (PROTONIX) 40 MG tablet Take 1 tablet (40 mg total) by mouth 2 (two) times daily. (Patient taking differently: Take 40 mg by mouth daily.)   predniSONE (DELTASONE) 20 MG tablet Take 20 mg by mouth 2 (two) times daily.   traMADol (ULTRAM) 50 MG tablet Take 50 mg by mouth every 6 (six) hours as needed for pain.    VICTOZA 18 MG/3ML SOPN Inject 1.8 mg into the skin daily at 6 (six) AM.   zolpidem (AMBIEN) 5 MG tablet Take 5 mg by mouth at bedtime.     Allergies:   Lisinopril   Social History   Socioeconomic History   Marital status: Married    Spouse name: Not on file   Number of children: Not on file   Years of education: Not on file   Highest education level: Not on file  Occupational History   Occupation: unemployed  Tobacco Use   Smoking status: Former    Packs/day: 1.00    Years: 25.00    Total pack years: 25.00    Types: Cigarettes    Quit date: 07/13/1996    Years since quitting: 25.9    Passive exposure: Past   Smokeless tobacco: Never  Vaping Use   Vaping Use: Never used  Substance and Sexual Activity   Alcohol use: No    Comment: 01/29/12 "used to abuse alcohol; last drink was in the 1980's"   Drug  use: No   Sexual activity: Yes  Other Topics Concern   Not on file  Social History Narrative   Not on file   Social Determinants of Health   Financial Resource Strain: Not on file  Food Insecurity: Not on file  Transportation Needs: Not on file  Physical Activity: Not on file  Stress: Not on file  Social Connections: Not on file     Family History: The patient's family history  includes Asthma in her daughter, mother, and son; Diabetes in an other family member; Heart disease in her mother; Heart failure in her maternal grandmother; Lupus in her daughter. There is no history of Colon cancer, Throat cancer, Stomach cancer, Esophageal cancer, Rectal cancer, Colon polyps, Crohn's disease, or Ulcerative colitis.  ROS:   Please see the history of present illness.    She feels that there is no cardiac issue.  She is able to ambulate and do things she wants to do without chest discomfort or limiting dyspnea.  She denies lower extremity swelling.  She smoked for 10 years from age [age].  All other systems reviewed and are negative.  EKGs/Labs/Other Studies Reviewed:    The following studies were reviewed today: High resolution CT 02/12/2022: IMPRESSION: 1. No evidence of fibrotic interstitial lung disease. 2. Unchanged mild central cylindrical bronchiectasis. 3. 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 4. Peribronchovascular ground-glass nodularity is again seen in the bilateral lower lobes and not significantly changed compared with prior exam, likely sequela of prior infection. 5. Coronary artery calcifications and aortic Atherosclerosis (ICD10-I70.0).   EKG:  EKG normal sinus rhythm with nonspecific T wave flattening and borderline short PR interval.  Poor R wave progression.  Relatively low voltage.  Recent Labs: No results found for requested labs within last 365 days.  Recent Lipid Panel    Component  Value Date/Time   CHOL 189 03/25/2010 2153   TRIG 138 03/25/2010 2153   HDL 50 03/25/2010 2153   CHOLHDL 3.8 Ratio 03/25/2010 2153   VLDL 28 03/25/2010 2153   LDLCALC 111 (H) 03/25/2010 2153    Physical Exam:    VS:  BP 124/68   Pulse 71   Ht '5\' 7"'$  (1.702 m)   Wt 203 lb 6.4 oz (92.3 kg)   SpO2 97%   BMI 31.86 kg/m     Wt Readings from Last 3 Encounters:  06/17/22 203 lb 6.4 oz (92.3 kg)  05/01/22 215 lb (97.5 kg)  04/09/22 215 lb 3.2 oz (97.6 kg)     GEN: Obese. No acute distress HEENT: Normal NECK: No JVD. LYMPHATICS: No lymphadenopathy CARDIAC: No murmur. RRR no gallop, or edema. VASCULAR:  Normal Pulses. No bruits. RESPIRATORY:  Clear to auscultation without rales, wheezing or rhonchi  ABDOMEN: Soft, non-tender, non-distended, No pulsatile mass, MUSCULOSKELETAL: No deformity  SKIN: Warm and dry NEUROLOGIC:  Alert and oriented x 3 PSYCHIATRIC:  Normal affect   ASSESSMENT:    1. Chest tightness   2. Essential hypertension   3. Diabetes mellitus type 2, insulin dependent ([place])   4. Obstructive sleep apnea   5. ILD (interstitial lung disease) ([place])   6. Other hyperlipidemia    PLAN:    In order of problems listed above:  There is coronary calcification noted on high-resolution chest CT performed in August 2023.  Exercise a Lexiscan pharmacologic Myoview to exclude significant obstructive coronary disease. Excellent blood pressure control on the current medical regimen.  Continue amlodipine and Cozaar. Consider Jardiance or Wilder Glade given her phenotype. Encouraged compliance with CPAP.  She admits to not wearing it at times. The most recent CT did not demonstrate evidence of interstitial lung disease. Given coronary artery calcification noted, the LDL target should be less than 70.  I would recommend increasing atorvastatin to 40 mg/day.  Overall education and awareness concerning primary/secondary risk prevention was discussed in detail: LDL less than 70,  hemoglobin A1c less than 7,  blood pressure target less than 130/80 mmHg, >150 minutes of moderate aerobic activity per week, avoidance of smoking, weight control (via diet and exercise), and continued surveillance/management of/for obstructive sleep apnea.  Consider 2D Doppler echocardiogram and 2-week monitor as additional workup if needed based upon ongoing clinical complaints.  To take aspirin 81 mg/day.   Medication Adjustments/Labs and Tests Ordered: Current medicines are reviewed at length with the patient today.  Concerns regarding medicines are outlined above.  Orders Placed This Encounter  Procedures   MYOCARDIAL PERFUSION IMAGING   EKG 12-Lead   No orders of the defined types were placed in this encounter.   Patient Instructions  Medication Instructions:  Your physician recommends that you continue on your current medications as directed. Please refer to the Current Medication list given to you today.  *If you need a refill on your cardiac medications before your next appointment, please call your pharmacy*  Testing/Procedures: Your physician has requested that you have en exercise stress myoview. For further information please visit [email or web address]. Please follow instruction sheet, as given.   Follow-Up: Will be determined based on results of Exercise Myoview Stress test  Other Instructions Please arrive 15 minutes prior to your appointment time for registration and insurance purposes.  The test will take approximately 3 to 4 hours to complete; you may bring reading material.  If someone comes with you to your appointment, they will need to remain in the main lobby due to limited space in the testing area. **If you are pregnant or breastfeeding, please notify the nuclear lab prior to your appointment**  How to prepare for your Myocardial Perfusion Test: Do not eat or drink 3 hours prior to your test, except you may have water. Do not consume products containing  caffeine (regular or decaffeinated) 12 hours prior to your test. (ex: coffee, chocolate, sodas, tea). Do bring a list of your current medications with you.  If not listed below, you may take your medications as normal. Do wear comfortable clothes (no dresses or overalls) and walking shoes, tennis shoes preferred (No heels or open toe shoes are allowed). Do NOT wear cologne, perfume, aftershave, or lotions (deodorant is allowed). If these instructions are not followed, your test will have to be rescheduled.  Please report to [address], [address] for your test.  If you have questions or concerns about your appointment, you can call the Nuclear Lab at [phone].  If you cannot keep your appointment, please provide 24 hours notification to the Nuclear Lab, to avoid a possible $50 charge to your account.  Important Information About Sugar         Signed, Sinclair Grooms, MD  06/17/2022 10:50 AM    [place]

## 2022-06-17 ENCOUNTER — Ambulatory Visit: Payer: HMO | Attending: Interventional Cardiology | Admitting: Interventional Cardiology

## 2022-06-17 ENCOUNTER — Encounter: Payer: Self-pay | Admitting: Interventional Cardiology

## 2022-06-17 VITALS — BP 124/68 | HR 71 | Ht 67.0 in | Wt 203.4 lb

## 2022-06-17 DIAGNOSIS — Z794 Long term (current) use of insulin: Secondary | ICD-10-CM

## 2022-06-17 DIAGNOSIS — E119 Type 2 diabetes mellitus without complications: Secondary | ICD-10-CM | POA: Diagnosis not present

## 2022-06-17 DIAGNOSIS — I10 Essential (primary) hypertension: Secondary | ICD-10-CM

## 2022-06-17 DIAGNOSIS — G4733 Obstructive sleep apnea (adult) (pediatric): Secondary | ICD-10-CM

## 2022-06-17 DIAGNOSIS — R0789 Other chest pain: Secondary | ICD-10-CM

## 2022-06-17 DIAGNOSIS — E7849 Other hyperlipidemia: Secondary | ICD-10-CM

## 2022-06-17 DIAGNOSIS — J849 Interstitial pulmonary disease, unspecified: Secondary | ICD-10-CM

## 2022-06-17 NOTE — Patient Instructions (Signed)
Medication Instructions:  Your physician recommends that you continue on your current medications as directed. Please refer to the Current Medication list given to you today.  *If you need a refill on your cardiac medications before your next appointment, please call your pharmacy*  Testing/Procedures: Your physician has requested that you have en exercise stress myoview. For further information please visit [email or web address] Please follow instruction sheet, as given.   Follow-Up: Will be determined based on results of Exercise Myoview Stress test  Other Instructions Please arrive 15 minutes prior to your appointment time for registration and insurance purposes.  The test will take approximately 3 to 4 hours to complete; you may bring reading material.  If someone comes with you to your appointment, they will need to remain in the main lobby due to limited space in the testing area. **If you are pregnant or breastfeeding, please notify the nuclear lab prior to your appointment**  How to prepare for your Myocardial Perfusion Test: Do not eat or drink 3 hours prior to your test, except you may have water. Do not consume products containing caffeine (regular or decaffeinated) 12 hours prior to your test. (ex: coffee, chocolate, sodas, tea). Do bring a list of your current medications with you.  If not listed below, you may take your medications as normal. Do wear comfortable clothes (no dresses or overalls) and walking shoes, tennis shoes preferred (No heels or open toe shoes are allowed). Do NOT wear cologne, perfume, aftershave, or lotions (deodorant is allowed). If these instructions are not followed, your test will have to be rescheduled.  Please report to [address], [address] for your test.  If you have questions or concerns about your appointment, you can call the Nuclear Lab at [phone].  If you cannot keep your appointment, please provide 24 hours notification to the  Nuclear Lab, to avoid a possible $50 charge to your account.  Important Information About Sugar

## 2022-06-22 ENCOUNTER — Telehealth (HOSPITAL_COMMUNITY): Payer: Self-pay | Admitting: *Deleted

## 2022-06-22 NOTE — Telephone Encounter (Signed)
Patient given detailed instructions per Myocardial Perfusion Study Information Sheet for the test on 06/24/22 Patient notified to arrive 15 minutes early and that it is imperative to arrive on time for appointment to keep from having the test rescheduled.  If you need to cancel or reschedule your appointment, please call the office within 24 hours of your appointment. . Patient verbalized understanding.[name] Jacqueline   

## 2022-06-24 ENCOUNTER — Ambulatory Visit (HOSPITAL_COMMUNITY): Payer: HMO | Attending: Cardiology

## 2022-06-24 DIAGNOSIS — R0789 Other chest pain: Secondary | ICD-10-CM

## 2022-06-24 LAB — MYOCARDIAL PERFUSION IMAGING
LV dias vol: 80 mL (ref 46–106)
LV sys vol: 34 mL
Nuc Stress EF: 57 %
Peak HR: 84 {beats}/min
Rest HR: 57 {beats}/min
Rest Nuclear Isotope Dose: 11 mCi
SDS: 4
SRS: 0
SSS: 4
ST Depression (mm): 0 mm
Stress Nuclear Isotope Dose: 31.4 mCi
TID: 1.07

## 2022-06-24 MED ORDER — TECHNETIUM TC 99M TETROFOSMIN IV KIT
11.0000 | PACK | Freq: Once | INTRAVENOUS | Status: AC | PRN
  Administered 2022-06-24: 11 via INTRAVENOUS

## 2022-06-24 MED ORDER — TECHNETIUM TC 99M TETROFOSMIN IV KIT
31.4000 | PACK | Freq: Once | INTRAVENOUS | Status: AC | PRN
  Administered 2022-06-24: 31.4 via INTRAVENOUS

## 2022-06-24 MED ORDER — REGADENOSON 0.4 MG/5ML IV SOLN
0.4000 mg | Freq: Once | INTRAVENOUS | Status: AC
  Administered 2022-06-24: 0.4 mg via INTRAVENOUS

## 2022-07-09 ENCOUNTER — Ambulatory Visit: Payer: PPO

## 2022-07-09 ENCOUNTER — Ambulatory Visit (INDEPENDENT_AMBULATORY_CARE_PROVIDER_SITE_OTHER): Payer: HMO

## 2022-07-09 ENCOUNTER — Ambulatory Visit: Payer: PPO | Admitting: Podiatry

## 2022-07-09 DIAGNOSIS — E1149 Type 2 diabetes mellitus with other diabetic neurological complication: Secondary | ICD-10-CM

## 2022-07-09 DIAGNOSIS — M778 Other enthesopathies, not elsewhere classified: Secondary | ICD-10-CM | POA: Diagnosis not present

## 2022-07-09 DIAGNOSIS — M79671 Pain in right foot: Secondary | ICD-10-CM | POA: Diagnosis not present

## 2022-07-09 DIAGNOSIS — Q666 Other congenital valgus deformities of feet: Secondary | ICD-10-CM

## 2022-07-09 DIAGNOSIS — M21619 Bunion of unspecified foot: Secondary | ICD-10-CM

## 2022-07-09 NOTE — Progress Notes (Signed)
Subjective:   Patient ID: [name], female   DOB: 66 y.o.   MRN: [id number]   HPI Chief Complaint  Patient presents with   Foot Pain    Pain has been going on for a few years, getting worse, pain located under the ball of feet and toe on right foot. Patient often looses balance due to pain    66 year old female presents the office today with above concerns.  She says she has some numbness in her feet she has neuropathy.  She gets pain in the arches of her feet.  She is interested in getting insoles for her shoes.    She did have an injury a few months ago where she bent her second and third toes back.  It is painful at times she does not have any bruising or swelling.  No pain to the area now.  No ulcerations she reports.  Review of Systems  All other systems reviewed and are negative.  Past Medical History:  Diagnosis Date   Allergy    seasonal   Anxiety    Arthritis    "both shoulders"   Asthma    Blood transfusion without reported diagnosis    Cataract    beginning   Chronic bronchitis ([facility])    Cold extremity without peripheral vascular disease    bilaterally; "from my knees down into my feet; they get ice cold"   COPD (chronic obstructive pulmonary disease) ([facility])    Environmental allergies    "year round"   Exertional dyspnea    Gastroparesis    GERD (gastroesophageal reflux disease)    Headache(784.0)    Heart murmur    Hiatal hernia    High cholesterol    HTN (hypertension)    Obesity    Rotator cuff tear    Sciatic nerve pain    "tailbone down into my legs when it flares up"   Sciatica    Sleep apnea    CPAP, sleep study on [facility]   Type II diabetes mellitus ([facility])    Vocal cord dysfunction    "I've had it for many years"    Past Surgical History:  Procedure Laterality Date   ABDOMINAL ADHESION SURGERY  ~ 1978   APPENDECTOMY  07/13/1974   CHOLECYSTECTOMY     COLONOSCOPY     NASAL SINUS SURGERY  07/13/1990   OOPHORECTOMY  07/13/1974   w/ovarian  cyst excision and appy   SHOULDER ARTHROSCOPY W/ ROTATOR CUFF REPAIR Bilateral 01/28/2012   left   TOE SURGERY     for ingrown [id number] ?left   TOTAL ABDOMINAL HYSTERECTOMY  07/14/1983     Current Outpatient Medications:    albuterol (PROVENTIL HFA;VENTOLIN HFA) 108 (90 BASE) MCG/ACT inhaler, Inhale 2 puffs into the lungs every 4 (four) hours as needed for wheezing or shortness of breath., Disp: , Rfl:    ALPRAZolam (XANAX) 0.5 MG tablet, Take 0.5 mg by mouth 2 (two) times daily as needed for anxiety. , Disp: , Rfl:    amLODipine (NORVASC) 5 MG tablet, SMARTSIG:1 Tablet(s) By Mouth Every Evening, Disp: , Rfl:    aspirin 81 MG tablet, Take 81 mg by mouth 2 (two) times a week., Disp: , Rfl:    atorvastatin (LIPITOR) 20 MG tablet, Take 20 mg by mouth every evening. , Disp: , Rfl:    Azelastine-Fluticasone 137-50 MCG/ACT SUSP, Place 1 spray into both nostrils daily as needed (for congestion)., Disp: , Rfl:    citalopram (CELEXA) 10 MG tablet,  Take 20 mg by mouth every evening. , Disp: , Rfl:    Continuous Blood Gluc Sensor (FREESTYLE LIBRE 14 DAY SENSOR) MISC, Apply topically as directed., Disp: , Rfl:    doxycycline (VIBRA-TABS) 100 MG tablet, Take 100 mg by mouth 2 (two) times daily., Disp: , Rfl:    fluticasone (FLONASE) 50 MCG/ACT nasal spray, Place 2 sprays into both nostrils daily., Disp: 16 g, Rfl: 2   gabapentin (NEURONTIN) 300 MG capsule, Take 300 mg by mouth 2 (two) times daily., Disp: , Rfl:    LANTUS SOLOSTAR 100 UNIT/ML Solostar Pen, Inject into the skin., Disp: , Rfl:    linaclotide (LINZESS) 290 MCG CAPS capsule, Take 1 capsule (290 mcg total) by mouth daily before breakfast., Disp: 90 capsule, Rfl: 3   loratadine (CLARITIN) 10 MG tablet, Take 10 mg by mouth daily as needed for allergies. , Disp: , Rfl:    losartan (COZAAR) 100 MG tablet, Take 100 mg by mouth daily. , Disp: , Rfl:    metFORMIN (GLUCOPHAGE) 1000 MG tablet, Take 1,000 mg by mouth 2 (two) times daily. , Disp: ,  Rfl:    metoCLOPramide (REGLAN) 5 MG tablet, TAKE 1 TABLET BY MOUTH BEFORE MEALS AND AT BEDTIME AS NEEDED (Patient taking differently: Take 5 mg by mouth 2 (two) times daily. Take 1 tablet by mouth before meals and at bedtime as needed), Disp: 90 tablet, Rfl: 0   pantoprazole (PROTONIX) 40 MG tablet, Take 1 tablet (40 mg total) by mouth 2 (two) times daily. (Patient taking differently: Take 40 mg by mouth daily.), Disp: 60 tablet, Rfl: 3   predniSONE (DELTASONE) 20 MG tablet, Take 20 mg by mouth 2 (two) times daily., Disp: , Rfl:    traMADol (ULTRAM) 50 MG tablet, Take 50 mg by mouth every 6 (six) hours as needed for pain. , Disp: , Rfl:    VICTOZA 18 MG/3ML SOPN, Inject 1.8 mg into the skin daily at 6 (six) AM., Disp: , Rfl:    zolpidem (AMBIEN) 5 MG tablet, Take 5 mg by mouth at bedtime., Disp: , Rfl: 3  Allergies  Allergen Reactions   Lisinopril Cough          Objective:  Physical Exam  General: AAO x3, NAD  Dermatological: Skin is warm, dry and supple bilateral.There are no open sores, no preulcerative lesions, no rash or signs of infection present.  Vascular: Dorsalis Pedis artery and Posterior Tibial artery pedal pulses are palpable bilateral with immedate capillary fill time.  There is no pain with calf compression, swelling, warmth, erythema.   Neruologic: Sensation mildly decreased with Semmes Weinstein monofilament.  Musculoskeletal: There is a decreased medial arch upon weightbearing bilaterally.  Tenderness is along the arch of the foot plantarly.  There is no area pinpoint tenderness.  No edema, erythema.  Flexor, extensor tendons appear to be intact.  MMT 5/5.  Gait: Unassisted, Nonantalgic.       Assessment:   66 year old female with flatfoot, arch pain/plantar fasciitis; type 2 diabetes with neuropathy     Plan:  -Treatment options discussed including all alternatives, risks, and complications -Etiology of symptoms were discussed -X-rays were obtained and  reviewed with the patient.  3 views bilateral feet were obtained.  Decreased calcaneal clinician well.  Calcaneal spurring present.  No evidence of acute fracture. -She is interested in orthotics.  She was measured for inserts today Completed H[employer] prior authorization paperwork and submitted.  -We discussed stretching and icing on regular basis.  For now continue  shoes with good arch supports. -Daily foot inspection.

## 2022-07-09 NOTE — Patient Instructions (Signed)

## 2022-07-23 DIAGNOSIS — G4733 Obstructive sleep apnea (adult) (pediatric): Secondary | ICD-10-CM | POA: Diagnosis not present

## 2022-08-06 DIAGNOSIS — G4733 Obstructive sleep apnea (adult) (pediatric): Secondary | ICD-10-CM | POA: Diagnosis not present

## 2022-08-20 ENCOUNTER — Ambulatory Visit (INDEPENDENT_AMBULATORY_CARE_PROVIDER_SITE_OTHER): Payer: HMO

## 2022-08-20 DIAGNOSIS — Q666 Other congenital valgus deformities of feet: Secondary | ICD-10-CM

## 2022-08-20 NOTE — Progress Notes (Signed)
Patient presents today to pick up custom molded foot orthotics recommended by Dr. Jacqualyn Posey.   Orthotics were dispensed and fit was satisfactory. Reviewed instructions for break-in and wear. Written instructions given to patient.  Patient will follow up as needed.   [facility] - order # [id number]

## 2022-08-25 DIAGNOSIS — F419 Anxiety disorder, unspecified: Secondary | ICD-10-CM | POA: Diagnosis not present

## 2022-08-25 DIAGNOSIS — I700 Atherosclerosis of aorta: Secondary | ICD-10-CM | POA: Diagnosis not present

## 2022-08-25 DIAGNOSIS — E782 Mixed hyperlipidemia: Secondary | ICD-10-CM | POA: Diagnosis not present

## 2022-08-25 DIAGNOSIS — J370 Chronic laryngitis: Secondary | ICD-10-CM | POA: Diagnosis not present

## 2022-08-25 DIAGNOSIS — G4733 Obstructive sleep apnea (adult) (pediatric): Secondary | ICD-10-CM | POA: Diagnosis not present

## 2022-08-25 DIAGNOSIS — J449 Chronic obstructive pulmonary disease, unspecified: Secondary | ICD-10-CM | POA: Diagnosis not present

## 2022-08-25 DIAGNOSIS — Z794 Long term (current) use of insulin: Secondary | ICD-10-CM | POA: Diagnosis not present

## 2022-08-25 DIAGNOSIS — E1140 Type 2 diabetes mellitus with diabetic neuropathy, unspecified: Secondary | ICD-10-CM | POA: Diagnosis not present

## 2022-08-25 DIAGNOSIS — K3184 Gastroparesis: Secondary | ICD-10-CM | POA: Diagnosis not present

## 2022-08-25 DIAGNOSIS — I10 Essential (primary) hypertension: Secondary | ICD-10-CM | POA: Diagnosis not present

## 2022-08-25 DIAGNOSIS — K219 Gastro-esophageal reflux disease without esophagitis: Secondary | ICD-10-CM | POA: Diagnosis not present

## 2022-08-25 DIAGNOSIS — J383 Other diseases of vocal cords: Secondary | ICD-10-CM | POA: Diagnosis not present

## 2022-09-06 DIAGNOSIS — G4733 Obstructive sleep apnea (adult) (pediatric): Secondary | ICD-10-CM | POA: Diagnosis not present

## 2022-09-25 ENCOUNTER — Ambulatory Visit
Admission: RE | Admit: 2022-09-25 | Discharge: 2022-09-25 | Disposition: A | Discharge status: Home / Self Care | Payer: PPO | Source: Ambulatory Visit | Attending: Internal Medicine | Admitting: Internal Medicine

## 2022-09-25 ENCOUNTER — Other Ambulatory Visit: Payer: Self-pay | Admitting: Internal Medicine

## 2022-09-25 DIAGNOSIS — M25552 Pain in left hip: Secondary | ICD-10-CM

## 2022-09-25 DIAGNOSIS — M549 Dorsalgia, unspecified: Secondary | ICD-10-CM | POA: Diagnosis not present

## 2022-10-05 DIAGNOSIS — G4733 Obstructive sleep apnea (adult) (pediatric): Secondary | ICD-10-CM | POA: Diagnosis not present

## 2022-10-26 DIAGNOSIS — M5440 Lumbago with sciatica, unspecified side: Secondary | ICD-10-CM | POA: Diagnosis not present

## 2022-10-26 DIAGNOSIS — M25552 Pain in left hip: Secondary | ICD-10-CM | POA: Diagnosis not present

## 2022-10-26 DIAGNOSIS — Z9989 Dependence on other enabling machines and devices: Secondary | ICD-10-CM | POA: Diagnosis not present

## 2022-11-03 DIAGNOSIS — M6281 Muscle weakness (generalized): Secondary | ICD-10-CM | POA: Diagnosis not present

## 2022-11-03 DIAGNOSIS — R262 Difficulty in walking, not elsewhere classified: Secondary | ICD-10-CM | POA: Diagnosis not present

## 2022-11-03 DIAGNOSIS — M5459 Other low back pain: Secondary | ICD-10-CM | POA: Diagnosis not present

## 2022-11-05 DIAGNOSIS — G4733 Obstructive sleep apnea (adult) (pediatric): Secondary | ICD-10-CM | POA: Diagnosis not present

## 2022-11-06 DIAGNOSIS — M6281 Muscle weakness (generalized): Secondary | ICD-10-CM | POA: Diagnosis not present

## 2022-11-06 DIAGNOSIS — R262 Difficulty in walking, not elsewhere classified: Secondary | ICD-10-CM | POA: Diagnosis not present

## 2022-11-06 DIAGNOSIS — M5459 Other low back pain: Secondary | ICD-10-CM | POA: Diagnosis not present

## 2022-11-09 DIAGNOSIS — R262 Difficulty in walking, not elsewhere classified: Secondary | ICD-10-CM | POA: Diagnosis not present

## 2022-11-09 DIAGNOSIS — M6281 Muscle weakness (generalized): Secondary | ICD-10-CM | POA: Diagnosis not present

## 2022-11-09 DIAGNOSIS — M5459 Other low back pain: Secondary | ICD-10-CM | POA: Diagnosis not present

## 2022-11-13 DIAGNOSIS — R262 Difficulty in walking, not elsewhere classified: Secondary | ICD-10-CM | POA: Diagnosis not present

## 2022-11-13 DIAGNOSIS — M5459 Other low back pain: Secondary | ICD-10-CM | POA: Diagnosis not present

## 2022-11-13 DIAGNOSIS — M6281 Muscle weakness (generalized): Secondary | ICD-10-CM | POA: Diagnosis not present

## 2022-11-17 DIAGNOSIS — R262 Difficulty in walking, not elsewhere classified: Secondary | ICD-10-CM | POA: Diagnosis not present

## 2022-11-17 DIAGNOSIS — M5459 Other low back pain: Secondary | ICD-10-CM | POA: Diagnosis not present

## 2022-11-17 DIAGNOSIS — M6281 Muscle weakness (generalized): Secondary | ICD-10-CM | POA: Diagnosis not present

## 2022-11-19 DIAGNOSIS — Z0000 Encounter for general adult medical examination without abnormal findings: Secondary | ICD-10-CM | POA: Diagnosis not present

## 2022-11-19 DIAGNOSIS — E782 Mixed hyperlipidemia: Secondary | ICD-10-CM | POA: Diagnosis not present

## 2022-11-19 DIAGNOSIS — E1143 Type 2 diabetes mellitus with diabetic autonomic (poly)neuropathy: Secondary | ICD-10-CM | POA: Diagnosis not present

## 2022-11-19 DIAGNOSIS — I700 Atherosclerosis of aorta: Secondary | ICD-10-CM | POA: Diagnosis not present

## 2022-11-19 DIAGNOSIS — I10 Essential (primary) hypertension: Secondary | ICD-10-CM | POA: Diagnosis not present

## 2022-11-19 DIAGNOSIS — K3184 Gastroparesis: Secondary | ICD-10-CM | POA: Diagnosis not present

## 2022-11-19 DIAGNOSIS — J4489 Other specified chronic obstructive pulmonary disease: Secondary | ICD-10-CM | POA: Diagnosis not present

## 2022-11-19 DIAGNOSIS — E1136 Type 2 diabetes mellitus with diabetic cataract: Secondary | ICD-10-CM | POA: Diagnosis not present

## 2022-11-19 DIAGNOSIS — K5900 Constipation, unspecified: Secondary | ICD-10-CM | POA: Diagnosis not present

## 2022-11-19 DIAGNOSIS — E1140 Type 2 diabetes mellitus with diabetic neuropathy, unspecified: Secondary | ICD-10-CM | POA: Diagnosis not present

## 2022-11-19 DIAGNOSIS — G4733 Obstructive sleep apnea (adult) (pediatric): Secondary | ICD-10-CM | POA: Diagnosis not present

## 2022-11-19 DIAGNOSIS — Z794 Long term (current) use of insulin: Secondary | ICD-10-CM | POA: Diagnosis not present

## 2022-11-20 DIAGNOSIS — M5459 Other low back pain: Secondary | ICD-10-CM | POA: Diagnosis not present

## 2022-11-20 DIAGNOSIS — R262 Difficulty in walking, not elsewhere classified: Secondary | ICD-10-CM | POA: Diagnosis not present

## 2022-11-20 DIAGNOSIS — M6281 Muscle weakness (generalized): Secondary | ICD-10-CM | POA: Diagnosis not present

## 2022-11-23 DIAGNOSIS — M545 Low back pain, unspecified: Secondary | ICD-10-CM | POA: Diagnosis not present

## 2022-12-01 DIAGNOSIS — R0981 Nasal congestion: Secondary | ICD-10-CM | POA: Diagnosis not present

## 2022-12-01 DIAGNOSIS — J45901 Unspecified asthma with (acute) exacerbation: Secondary | ICD-10-CM | POA: Diagnosis not present

## 2022-12-01 DIAGNOSIS — J449 Chronic obstructive pulmonary disease, unspecified: Secondary | ICD-10-CM | POA: Diagnosis not present

## 2022-12-04 DIAGNOSIS — M5459 Other low back pain: Secondary | ICD-10-CM | POA: Diagnosis not present

## 2022-12-04 DIAGNOSIS — R262 Difficulty in walking, not elsewhere classified: Secondary | ICD-10-CM | POA: Diagnosis not present

## 2022-12-04 DIAGNOSIS — M6281 Muscle weakness (generalized): Secondary | ICD-10-CM | POA: Diagnosis not present

## 2022-12-05 DIAGNOSIS — G4733 Obstructive sleep apnea (adult) (pediatric): Secondary | ICD-10-CM | POA: Diagnosis not present

## 2022-12-11 DIAGNOSIS — J4542 Moderate persistent asthma with status asthmaticus: Secondary | ICD-10-CM | POA: Diagnosis not present

## 2022-12-11 DIAGNOSIS — R262 Difficulty in walking, not elsewhere classified: Secondary | ICD-10-CM | POA: Diagnosis not present

## 2022-12-11 DIAGNOSIS — K219 Gastro-esophageal reflux disease without esophagitis: Secondary | ICD-10-CM | POA: Diagnosis not present

## 2022-12-11 DIAGNOSIS — M5459 Other low back pain: Secondary | ICD-10-CM | POA: Diagnosis not present

## 2022-12-11 DIAGNOSIS — I10 Essential (primary) hypertension: Secondary | ICD-10-CM | POA: Diagnosis not present

## 2022-12-11 DIAGNOSIS — E782 Mixed hyperlipidemia: Secondary | ICD-10-CM | POA: Diagnosis not present

## 2022-12-11 DIAGNOSIS — E1140 Type 2 diabetes mellitus with diabetic neuropathy, unspecified: Secondary | ICD-10-CM | POA: Diagnosis not present

## 2022-12-11 DIAGNOSIS — M6281 Muscle weakness (generalized): Secondary | ICD-10-CM | POA: Diagnosis not present

## 2022-12-11 DIAGNOSIS — G8929 Other chronic pain: Secondary | ICD-10-CM | POA: Diagnosis not present

## 2023-01-05 DIAGNOSIS — G4733 Obstructive sleep apnea (adult) (pediatric): Secondary | ICD-10-CM | POA: Diagnosis not present

## 2023-01-07 DIAGNOSIS — M169 Osteoarthritis of hip, unspecified: Secondary | ICD-10-CM | POA: Diagnosis not present

## 2023-01-07 DIAGNOSIS — J441 Chronic obstructive pulmonary disease with (acute) exacerbation: Secondary | ICD-10-CM | POA: Diagnosis not present

## 2023-01-07 DIAGNOSIS — G4700 Insomnia, unspecified: Secondary | ICD-10-CM | POA: Diagnosis not present

## 2023-01-07 DIAGNOSIS — J4542 Moderate persistent asthma with status asthmaticus: Secondary | ICD-10-CM | POA: Diagnosis not present

## 2023-01-07 DIAGNOSIS — K219 Gastro-esophageal reflux disease without esophagitis: Secondary | ICD-10-CM | POA: Diagnosis not present

## 2023-01-07 DIAGNOSIS — I10 Essential (primary) hypertension: Secondary | ICD-10-CM | POA: Diagnosis not present

## 2023-01-07 DIAGNOSIS — E782 Mixed hyperlipidemia: Secondary | ICD-10-CM | POA: Diagnosis not present

## 2023-01-07 DIAGNOSIS — E1140 Type 2 diabetes mellitus with diabetic neuropathy, unspecified: Secondary | ICD-10-CM | POA: Diagnosis not present

## 2023-01-07 DIAGNOSIS — G8929 Other chronic pain: Secondary | ICD-10-CM | POA: Diagnosis not present

## 2023-01-11 DIAGNOSIS — G4733 Obstructive sleep apnea (adult) (pediatric): Secondary | ICD-10-CM | POA: Diagnosis not present

## 2023-02-09 DIAGNOSIS — J029 Acute pharyngitis, unspecified: Secondary | ICD-10-CM | POA: Diagnosis not present

## 2023-02-09 DIAGNOSIS — J383 Other diseases of vocal cords: Secondary | ICD-10-CM | POA: Diagnosis not present

## 2023-02-09 DIAGNOSIS — J4542 Moderate persistent asthma with status asthmaticus: Secondary | ICD-10-CM | POA: Diagnosis not present

## 2023-03-03 DIAGNOSIS — G4733 Obstructive sleep apnea (adult) (pediatric): Secondary | ICD-10-CM | POA: Diagnosis not present

## 2023-03-08 ENCOUNTER — Other Ambulatory Visit: Payer: Self-pay | Admitting: Internal Medicine

## 2023-03-08 DIAGNOSIS — Z1231 Encounter for screening mammogram for malignant neoplasm of breast: Secondary | ICD-10-CM

## 2023-04-13 ENCOUNTER — Ambulatory Visit
Admission: RE | Admit: 2023-04-13 | Discharge: 2023-04-13 | Disposition: A | Discharge status: Home / Self Care | Payer: PPO | Source: Ambulatory Visit | Attending: Internal Medicine | Admitting: Internal Medicine

## 2023-04-13 DIAGNOSIS — Z1231 Encounter for screening mammogram for malignant neoplasm of breast: Secondary | ICD-10-CM | POA: Diagnosis not present

## 2023-05-21 DIAGNOSIS — E1140 Type 2 diabetes mellitus with diabetic neuropathy, unspecified: Secondary | ICD-10-CM | POA: Diagnosis not present

## 2023-05-21 DIAGNOSIS — G4733 Obstructive sleep apnea (adult) (pediatric): Secondary | ICD-10-CM | POA: Diagnosis not present

## 2023-05-21 DIAGNOSIS — K3184 Gastroparesis: Secondary | ICD-10-CM | POA: Diagnosis not present

## 2023-05-21 DIAGNOSIS — E1143 Type 2 diabetes mellitus with diabetic autonomic (poly)neuropathy: Secondary | ICD-10-CM | POA: Diagnosis not present

## 2023-05-21 DIAGNOSIS — J383 Other diseases of vocal cords: Secondary | ICD-10-CM | POA: Diagnosis not present

## 2023-05-21 DIAGNOSIS — J310 Chronic rhinitis: Secondary | ICD-10-CM | POA: Diagnosis not present

## 2023-05-21 DIAGNOSIS — Z9989 Dependence on other enabling machines and devices: Secondary | ICD-10-CM | POA: Diagnosis not present

## 2023-05-21 DIAGNOSIS — I10 Essential (primary) hypertension: Secondary | ICD-10-CM | POA: Diagnosis not present

## 2023-05-21 DIAGNOSIS — E1136 Type 2 diabetes mellitus with diabetic cataract: Secondary | ICD-10-CM | POA: Diagnosis not present

## 2023-05-21 DIAGNOSIS — J449 Chronic obstructive pulmonary disease, unspecified: Secondary | ICD-10-CM | POA: Diagnosis not present

## 2023-05-21 DIAGNOSIS — G4700 Insomnia, unspecified: Secondary | ICD-10-CM | POA: Diagnosis not present

## 2023-05-21 DIAGNOSIS — F419 Anxiety disorder, unspecified: Secondary | ICD-10-CM | POA: Diagnosis not present

## 2023-05-31 DIAGNOSIS — G4733 Obstructive sleep apnea (adult) (pediatric): Secondary | ICD-10-CM | POA: Diagnosis not present

## 2023-09-09 ENCOUNTER — Encounter: Payer: Self-pay | Admitting: Internal Medicine

## 2023-10-05 DIAGNOSIS — G4733 Obstructive sleep apnea (adult) (pediatric): Secondary | ICD-10-CM | POA: Diagnosis not present

## 2023-10-19 DIAGNOSIS — J45909 Unspecified asthma, uncomplicated: Secondary | ICD-10-CM | POA: Diagnosis not present

## 2023-11-09 DIAGNOSIS — M542 Cervicalgia: Secondary | ICD-10-CM | POA: Diagnosis not present

## 2023-11-12 DIAGNOSIS — M545 Low back pain, unspecified: Secondary | ICD-10-CM | POA: Diagnosis not present

## 2023-11-12 DIAGNOSIS — M542 Cervicalgia: Secondary | ICD-10-CM | POA: Diagnosis not present

## 2023-11-22 NOTE — Progress Notes (Unsigned)
 OV 02/17/2011. 68 year old female. Obese. AA female. Remote smoker On cpap for OSA (pmd managing). REferred by Dr Joice Nares.    New visit for chronic cough. Preesent for many years. Insidious onset.  Progressively with more frequency past few years. Cough rated as moderate. Usually dry cough with associated barking quality. Occ mucus present; thick and clear. Cough worsened by milk products, change in weather and dust. Hx of prior ENT eval in [place]  > 15 years ago: diagnosed as "vocal cords sticking together" and remote hx of sinus surgery Also diagnosed with GERD in [place] > 15 years ago; takes protonix .  [facility] Reflux Symptom INdex score is 39 and very c/w LPR cough(severe hoarsness of voice, clearing of throat, ecess throat mucus, post nasal drip, coughing after lying down or eating, choking epidoses, sensation of globus with food sticking in thorat) and associated heart burn. Cough so severe that she could not complete spirometry in office today  Has dyspnea too: few years, insidious onset. Progressive. Rates it as moderate. Dyspnea brought on by associated chest tightness and nasal drainage or even exertion like climbing flight of steps. Dyspnea relieved by rest. Cough and dyspnea associated with wheezing and constant yellow sinus drainage as well. Frequent nocturnal awakenings with choking present. Above resulted in asthma diagnosis versus tracheobronchiolotis > 15 years ago: on advair since then which she is unsure is helping. She herself is not convinced she has asthma.   Associated med intake shows tramadol (for shoulder pain and sicatica), fish oil/ flaxseed (2 years) and ACE inhibitor intake (been on lisniopril for > 10 years due to diabetes)   Pas med hx review fromn outside record notes "hx of asthma and vocal cord dysfunction and post nasal drip. Recalls frequent ER visits atleast one per month (last er visit 2 months ago per hx) to [place]. Denies ICU admits, intubations for  same.   REC Cough is from ACE Inhibitor, sinus drainage, vocal cord dysfunction, acid reflux and possible cough all conspiring to cause cyclical cough/LPR cough #Sinus drainage  - continue nasal steroid - refer to [facility] ENT Dr. Melissa Spring or Dr Tellis Feathers #Possible Acid Reflux  - continue protonix    - take diet sheet from us  - avoid colas, spices, cheeses, spirits, red meats, beer, chocolates, fried foods etc.,   - sleep with head end of bed elevated  - eat small frequent meals  - do not go to bed for 3 hours after last meal - stop fish oil and flaxseed for now #Asthma   - continue advair for now - might have to consider changing over to something else later depending on ENT evaluation #Vocal Cord Dysfunction - see ENT doctor first  -later  will consider referral to speech therapy with Mr [name] #BP  - stop lisinopril - take benicar  1 tablet daily - take sample, nurse will add it in med list #Followup - I will see you in 4-6 weeks.  - Reattempt spirometry with [name] at time of followup -Important you comply with advice 100% correctly   OV 03/31/11: Followup cough. SAw ENT 89/17./12: no anatomic path or VCD noticed at time of exam though patient noted to have choked.  She states that she was not happy with her experience at ENT visit and does not want to visit the same office again. She is compliant with instructions except for diet though she has made improvements there. Off lisniopril. Follows sinus, gerd and astham advice. Does not  like advair diskus. Overall cough is much better. She is happy with improvement. RSI cough score dropped from 39 to 14.  She sses her hoarsenss, clearing and pos nasal drip as mild-moderate  PFTS show no more flow volume loop issues. There is restriction with mixed obstruction. DLCO 67%   REC Cough is from sinus drainage, vocal cord dysfunction, acid reflux and possible cough all conspiring to cause cyclical cough/LPR cough  #Sinus  drainage  - continue nasal steroid - nurse will do script for generic fluticasone  -  #Acid Reflux  - continue protonix    - take diet sheet from us  agaub - avoid colas, spices, cheeses, spirits, red meats, beer, chocolates, fried foods etc.,   - sleep with head end of bed elevated  - eat small frequent meals  - do not go to bed for 3 hours after last meal - cotninue to avoid fish oil and flaxseed   #Asthma   - continue advair but nurse will give you sample of HFA and teach you how to take it with spacer; she will do script as well  #Vocal Cord Dysfunction - referral to speech therapy with Mr [name]  #BP  - will add lisinopril to allergy  list - take benicar  1 tablet daily or the equivalent Dr Joice Nares gives you  #Followup - I will see you in 8 weeks.  - -Important you comply with advice 100% correctly - Glad you are much better - Flu shot today please   OV 06/26/2011 Followup for chronic cough. She continues to do better. Subjectively feels much better compared to last visit. Although objectively her cough score has only reduced by 1 point. Today, RSI cough score is 13. Level III postnasal drip. Level to hoarseness of voice, cough after lying down, choking episodes, and annoying cough. Level I sensation of lump in throat and heartburn.  In talking to her she is compliant with her nasal steroid and her Protonix  for acid reflux but not compliant with diet for acid reflux. She is on Advair HFA with spacer for presumed asthma and she feels this makes her cough less compared to Advair discus. We discussed more about her asthma history and she says that she was never sure she had asthma diagnosis in [place]  it was a presumptive diagnosis. She is eager to come off Advair and give a trial without it. She also gives a history of allergies and allergy  shots while living in [place]  and she is open to an allergy  evaluation right now. In terms of cyclical cough she has finished speech therapy  evaluation and graduated from the program. Overall she feels quality-of-life is acceptable although she would like to see cough improve further. So far we have not tried Neurontin     Past, Family, Social reviewed: no change since last visit except that Dr Lorena Rolling is new PMD. She now states she has year round allergies. Recollects while in [place]  used to take allergy  shots twice a week 10 years ago. Since moving to [place] 4 years ago not seen an allergist. Wynona Hedger to see allergist. She feels she does not have asthma.  Cough is from sinus drainage, vocal cord dysfunction, acid reflux and possible asthma or allergiesh all working together to cause cyclical cough/LPR cough  #Sinus drainage  - continue nasal steroid - nurse will do script for generic fluticasone   -  #Acid Reflux  - continue protonix   - contniue (REALLY IMPORANT IS DIET) diet sheet from us  agaub - avoid colas, spices, cheeses, spirits,  red meats, beer, chocolates, fried foods etc.,  - sleep with head end of bed elevated  - eat small frequent meals  - do not go to bed for 3 hours after last meal  - cotninue to avoid fish oil and flaxseed  #Asthma  - since diagnosis of asthma was in doubt. Please stop Advair and just use albuterol  as needed  -We'll do spirometry at followup  # allergies  - Please see Dr. Rosa College in our office for allergy  evaluation; will do referral  #Vocal Cord Dysfunction  - congratulations on graduating from program with speech therapy Mr [name]  #BP  - Continue losartan   #Followup  - I will see you in 8 weeks.  - -Important you comply with advice 100% correctly  - Glad you are much better  - Cough score worksheet at followup   OV 08/25/2011 Followup for chronic cough. She is off advair as advised. She is doing gerd control.  Ojectively her cough score is not reduced. Last visit score was 13 but now it is 14. (Level III postnasal drip, and post nasal drip. . Level 2  hoarseness of voice and  choking episodes. Level 1 - cough after lying down,and  sensation of lump in throat and heartburn). Howevefr, subjectively cough much improved. Still with post nasal drip. Has seen Dr Linder Revere for allergy  08/06/11 and 08/19/11. RAST serum profile negative except for elevated [place] IGE. Allergy  skin test pending 09/16/11. On visit 08/19/11 to Dr Linder Revere had atypical chest pain - releived with ppi in office. EKG okay. Trop was normal. . Of note, Dr Linder Revere is also addressing her prior OSA issues.   ROS c/o anxiety, insomnia, also chest pains  - atypical  Spirometry  - fev1 1.74L/72%. Ratio 78    #Chest pains  - please see [facility] cardiology asap - > equivocal stress test and but clinically low probability February 2013; placed in clinical followup #Cough  - for sinus: follow Dr Linder Revere recommendation  - for acid reflux: follow medications and diet  - for possible asthma: stay off advair and once cardiology ok, please have methacholine challenge test  #Followup  - complete cardiology visit and methacholine challenge  - 4-6 weeks from now  09/16/11 with Dr Linder Revere Allergist- 63 yoF former smoker referred by Dr Bertrum Brodie who has been seeing her for multifactorial cough, questioning an allergic component At last visit she had presented with chest pain, preventing planned allergy  skin testing. Now following with cardiology and pending CT scan of her heart/Dr. Arlester Ladd. Still complains of nasal congestion. Able to use her CPAP more regularly when her nose is not stopped up. Unable to do methacholine challenge last month because her baseline is from a tree scores were too low. CPAP autotitration done/[facility]. Pending download. PFT- 09/11/11- FVC 2.15/68%, FEV1 1.59/ 64%, FEV1/FVC 0.80. FEF 25-75 % 0.37/ 15%   OV 03/21/2013 Followup chronic cough  - I have not seen her in nearly 15 months. She says that basically her cough resolved but cough is no longer chronic and only occurs intermittently when she has "bronchitis".  In the last year she's had antibiotics and prednisone  for acute episodes of coughing at least 3 times. In between episodes she is asymptomatic. Currently she feels she's having one such episode in the past 3 weeks with chest congestion, cough with white mucus, fatigue and associated wheeze. Currently RSI cough score is 21 and show severe cough.  - Of note, she has not had methacholine challenge test  that I advised a year and half ago. She says that she does not have vocal cord dysfunction based on Dr. Garald Jumbo exam in August 2012 ENT physician   REC levaquin  and pred burst  04/20/2013 Follow up and Med review  new med calendar - pt brought all meds with her today.  does report some sinus pressure, congestion, SOB, wheezing, chest tightness. Is feeling better but still gets into coughing fits that takes her a while to calm down. Strong odors seem to causes attacks along with laughing  She did have an attack in the office. Sats were normal however she has significant upper airway psuedowheezing w/ barking cough.  No fever , chest pain, over reflux , orthopnea or edema.  No using CPAP lately   REC top Advair . Add Chlor trimeton 4mg  2 At bedtime   Change Loratadine  10mg  in am.  Add Pepcid 20mg  At bedtime   Avoid throat clearing , use sips of water.  Saline nasal rinses Twice daily  .  NO MINT PRODUCTS  GERD diet .  We are setting you up for a CT neck .  Restart CPAP At bedtime   follow up Dr. Bertrum Brodie in 4 weeks.  Please contact office for sooner follow up if symptoms do not improve or worsen or seek emergency care    OV  05/26/2013 Chief Complaint  Patient presents with   Follow-up    Pt c/o chest tightness, dry cough, wheezing and sob. Pt states this was some better while she was on prednisone . Last dose x 3 weeks ago.   Followup for chronic cough. She says her last 2-3 weeks that she's having an acute bronchitis flare with yellow phlegm. She feels she needs antibiotics and  prednisone . In fact in the time that she last saw me in September 2014 she's had another round of antibiotics and prednisone  with her primary care physician Dr. Hussain. She feels her chest is congested although in the office she is clearly having a barking cough, laryngeal spasm and upper airway noise intermittently with changes in voice. It is fairly significant. I reminded her that this is consistent with vocal cord dysfunction but she said that her ear nose throat surgeon cleared her for the same. Of note, she had a CT scan of the neck and this was normal.  07/26/2013 Follow up  2 month follow up - reports some  congestion, head congestion w/ light yellow mucus that is on/off. Has sinus drainage daily . Is not using claritin  or chlortrimeton as recommended.  Was taken off ACE in past , now on Cozaar .   RSI cough score unchanged at 21  Had Esophagram on 1/8 w/ mild GERD/HH. No sign delay emptying .  No chest pain, orthopnea, edema , fever.  +hoarseness on/off.   She says she feels so much better with CPAP , using during daytime for naps and At bedtime  . Is amazed at how much she feels better.    OV 03/20/2014  Chief Complaint  Patient presents with   Follow-up    Pt states she is unable to hold her voice as long when she sings. Pt c/o PND and prod cough, pt thinks its d/t allergies. Pt c/o chest soreness on right upper chest.    Followup chronic cough  - I have not seen the patient in nearly 10 months. Overall cough was stable but recently she has had a flareup. I'm not so sure that she is followed instructions compliantly. It  took me a while to understand the medication regimen but it appears that she takes dymista  for  Sinuses but saline wash as needed, and not taking claritin , Advair as needed for her lungs, Protonix  schedule followup acid reflux but Pepcid only as needed. She is only somewhat compliant with the Neurontin . She continues to have sinus drainage, clearing of the throat and the  laryngeal/barking cough that she feels is coming from the throat. This is associated with dyspnea and is of moderate severity, and some wheezing but she feels wheeze is upper airway  REC #Chronic cough  - I thnk cough is related to voice box issues and loss of neural control of the same - Please take prednisone  40 mg x1 day, then 30 mg x1 day, then 20 mg x1 day, then 10 mg x1 day, and then 5 mg x1 day and stop - refer Dr Iline Mallory of [facility] for chronic cough - for now continue current nasal, acid reflux and neurontin  therapies - continue albuterol  as needed ; for now hold off scheduled advair - repeat HRCT chest wo contrast for chronic cough  #FOllowup  3 months - after you see Dr Brent Cambric of [facility]  Come sooner if there are problems o  OV 10/22/2014  Chief Complaint  Patient presents with   Follow-up    Pt stated her cough has improved. Pt c/o hoarseness and chest tightness. Pt is wearing CPAP nightly and denies any issues with machine, mask or pressure.     FU chronic cough  - IN interim in Sept 2015 had CT chest : CT chest 03/26/14 shows worsening infiltreats in bases compaed to nov 2014. COncern for aspiration and referred  To GI. She does not know details of GI Visit but review of records shows she has gastroparesis and hs been started on reglan . Also she saw Dr Brent Cambric of ENT in [place]. WIth above measure cough has resolved. /nearly resolved. No respiratory issues.   reports that she quit smoking about 18 years ago. Her smoking use included Cigarettes. She has a 25 pack-year smoking history. She has never used smokeless tobacco.   OV 11/12/2014  Chief Complaint  Patient presents with   Follow-up    Pt here after PFT. Pt stated her breathing has slightly improved,she is less SOB. Pt stated her cough has also imrpoved. Pt has not yet had autoimmune panel.    Follow-up chronic cough with interstitial lung disease findings  As of last visit her cough  resolved. Cough was related to aspiration. As soon as she started following gastroenterology advice cough resolved. However follow-up CT scan of the chest in April 2016 compared to fall 2015 showed persistent ILD findings as described below [personally reviewed image]. At this point she is asymptomatic. I had her do pulmonary function test and autoimmune workup and reports for follow-up. Of these she only completed pulmonary function test that shows restriction with slight reduction in diffusion capacity [some of the restriction can be due to obesity]. She continues to be asymptomatic. She's not having reflux symptoms anymore. Of note, she forgot to have a autoimmune test. She does admit that her daughter has lupus   IMPRESSION: CT chest 10/29/2014 personally reviewed image  1. Stable findings of peribronchovascular ground-glass attenuation, predominantly in the basal portions of the lungs bilaterally, again favored to reflect nonspecific interstitial pneumonia (NSIP). 2. Mild air trapping, indicative of mild small airways disease. Electronically Signed   By: Alexandria Angel M.D.   On:  10/29/2014 16:19  Pulmonary function test 10/31/2014 shows restriction with reduced diffusion capacity. FVC 2.1 L/69%, FEV1 1.8 L/75% and ratio of 86. No broncho-dilator response. Total lung capacity 3.5 L/63% and DLCO 20.0/70% Sleep Apnea FOLLOWS FOR: pt wearing cpap nightly, c/o mask getting hot sometimes qhs.  has trouble staying asleep.      OV 06/10/2015  Chief Complaint  Patient presents with   Follow-up    Pt states she has been getting more frequent sinus infections, slight increase in SOB, chest heaviness. Pt here after HRCT. Pt wearing CPAP nightly.    Follow-up multifactorial chronic cough Follow-up mild basal interstitial lung disease not otherwise specified  She currently reports that she is not having much cough although after I left the office I heard a cough and periodically she did have  variable upper airway noise. She continues to have gastroparesis related acid reflux particularly when she eats food she regurgitates. She last saw GI March 2016 and was asked to increase and metoclopramide . She's not having much dyspnea. Overall she feels okay. She is having some atypical chest pain going on for a few months in the right supramammary area   07/16/2015-68 year old female former smoker followed by Dr. Bertrum Brodie for mild ILD, recurrent sinus infections, chronic cough, Sleep Apnea FOLLOWS FOR: pt wearing cpap nightly, c/o mask getting hot sometimes qhs.  has trouble staying asleep.   Here for CPAP follow-up. "Loves it" and says she is wearing it all night every night. Fullface mask, [facility]/12.  Not noticing active respiratory problems currently with little cough    OV  09/18/2015  Chief Complaint  Patient presents with   Follow-up    pt recently seen at [facility] ED for URI- pt c/o prod cough with thick clear mucus, SOB with exertion, wheezing.     Follow-up chronic cough which is multifactorial with a strong component of irritable larynx syndrome, vocal cord dysfunction and gastroparesis related to diabetes  Follow-up interstitial lung disease probably as a result of above but etiology uncertain   I last saw her in November 2016. She was supposed to follow-up with me in November 2017. In December 2016 I did make her see GI. They did a swallow study. I reviewed those results and she did not aspirate but coughed significantly during swallowing. No specific interventions have been recommended. It appears she has increase and metoclopramide . Then 09/01/2015 ended up in the ER with shortness of breath and wheezing episode and was given antibiotics and prednisone  according to her history. Now she significantly improved and close to baseline. She does have some wheezing but she says it's all from the upper airway. She does not want another course of anabiotic's of prednisone . She is on  Neurontin  but this is not helping. Her creatinine was 1 mg percent troponin was normal and hemoglobin was normal 09/01/2015. She had chest x-ray 09/01/2015 and is clear per report. I did not visualize this film.  In terms of her vocal cord dysfunction review of my prior notes shows that she is seen ENT multiple times. Most recently she saw [facility] Dr. Brent Cambric and she had resolution but now the cough due to irritable larynx has come back. She says she cannot go back to [facility] because of insurance reasons. She is willing to reestablish with the ENT locally.   OV 06/15/2016  Chief Complaint  Patient presents with   Follow-up    Pt states she is waking every 2 hours because she is SOB, pt is  still wearing CPAP. Pt c/o prod cough with clear mucus. Pt states she just completed a pred taper and abx for URI.      Follow-up multifactorial chronic cough Follow-up mild basal interstitial lung disease not otherwise specified   Obese female with significant vocal cord dysfunction/irritable larynx syndrome returns for follow-up. Last seen March 2017. She had follow-up high-resolution CT chest 06/09/2016 that I personally visualized. The findings show extremely subtle presence of interstitial lung disease all stable since 2014. In terms of symptoms of cough she is overall stable. However she tells me she has significant episodes of dyspnea at night when she wears a CPAP. Here in the office when asked her to open her mouth she made it given the significant upper airway coughing spell. She also complains of dyspnea on exertion.   OV 08/14/2016  Chief Complaint  Patient presents with   Follow-up    Was told to follow up after stress test. Stress test was done back in Dec 2017. Breathing has ok for the past few weeks, had a minor URI.      68 y.o. Aaron AasKENNY TIBERIO presents for fu of CPST results in 07/01/16. Reviewed terst results with her. Somewhat submaximal of RER 0.96. Audible wheezing prior  to start of test. No EIB. Has vo2 max that went from low normal to normal when correced for IBW. Not clear if there was diast dysfn. Got dyspneic  OV 02/16/2017  Chief Complaint  Patient presents with   Follow-up    Pt states she thinks her breathing has recently worsened d/t bronchitis. Pt states she is taking pred. Pt c/o non prod cough, chest discomfort from coughing, chest congestion. Pt deines f/c/s.     Follow-up multifactorial chronic cough with vcd Follow-up mild basal interstitial lung disease not otherwise specified   This is a routine follow-up for the above issues but 2 days ago she ended up in the ER because of sinus drainage bronchospasm. According to her history she was discharged with 10 days of amoxicillin  and 5 days of prednisone . She is much improved although she still complaining of chest tightness in throat tightness and cough. She also some increased wheezing than baseline but all improving. There are no other new issues. Labs and chest x-ray from the ER visit review reviewed and documented below.   OV 08/20/17  Chief Complaint  Patient presents with   Follow-up    HRCT done 08/19/17.  Pt states the SOB is a little worse since last visit and pt does have some coughing with occ. thick mucus and congestion due to sinus problems   Follow-up multifactorial chronic cough with severe irritable larynx/VCD  follow-up mild basal interstitial lung disease that has been stable for many years.    Overall stable.  In the interim she has had shoulder surgery.  She complains of class 3/2 dyspnea on exertion that is stable.  Her weight is unchanged.  No new issues. Walking desaturation test on 08/20/2017 185 feet x 3 laps on ROOM AIR:  did NOT desaturate. Rest pulse ox was 100%, final pulse ox was 100%. HR response 68/min at rest to 91/min at peak exertion. Patient SEDA TISCHNER  Did not Desaturate < 88% . [place] Jump did not  Desaturated </= 3% points. [place] Jump yes did get  tachyardic CT scan of the chest showed stability.     IMPRESSION: HRCT 1. There are very subtle changes in the lung bases which are again suggestive of interstitial  lung disease. The CT pattern is indeterminate for usual interstitial pneumonia (UIP), and given the stability compared to 2017, and the presence of air trapping, is rather favored to reflect mild nonspecific interstitial pneumonia (NSIP). 2. Tracheobronchomalacia. 3. Aortic atherosclerosis, in addition to left circumflex coronary artery disease. Please note that although the presence of coronary artery calcium  documents the presence of coronary artery disease, the severity of this disease and any potential stenosis cannot be assessed on this non-gated CT examination. Assessment for potential risk factor modification, dietary therapy or pharmacologic therapy may be warranted, if clinically indicated.   Aortic Atherosclerosis (ICD10-I70.0).     Electronically Signed   By: Alexandria Angel M.D.   On: 08/19/2017 12:56   OV 02/05/2020  Subjective:  Patient ID: Cayetano Coco, female , DOB: Mar 01, 1956 , age 27 y.o. , MRN: [id number] , ADDRESS: [address] [place] [address]   02/05/2020 -   Chief Complaint  Patient presents with   Follow-up    Pt states she has been doing okay since last visit. Denies any real complaints of SOB or coughing. Pt states that she has been seeing her PCP when needed.   Follow-up multifactorial chronic cough with severe irritable larynx/VCD  follow-up mild basal interstitial lung disease that has been stable for many years.    Gabryella Aslan 68 y.o. -returns for follow-up of the above medical problems.  Not seen her since 2019.  She is supposed to have seen me in 2020 but because of the pandemic of COVID-19 she did not see me.  She says she has had a Covid vaccine.  She has been doing stable.  She uses a CPAP.  Therefore her vocal cord wheezing is somewhat stable although  persistent and mild.  Her dyspnea on exertion is mild but still present.  She thinks overall maybe she is a little bit better.  Definitely no new medical issues.  No new medications.  No complications in her health so far.  On examination today when she opened her mouth we could hear wheezing coming from her upper airways.  When she closed about this wheezing went away.  When she took a deep breath this wheezing came back.  She could not cooperate with exam nitric oxide testing  Narrative & Impression  CLINICAL DATA:  Shortness of breath on exertion. Chronic bronchitis.   EXAM: CT CHEST WITHOUT CONTRAST   TECHNIQUE: Multidetector CT imaging of the chest was performed following the standard protocol without intravenous contrast. High resolution imaging of the lungs, as well as inspiratory and expiratory imaging, was performed.   COMPARISON:  08/19/2017.   FINDINGS: Cardiovascular: Atherosclerotic calcification of the aorta and coronary arteries. Heart size normal. No pericardial effusion.   Mediastinum/Nodes: Mediastinal and axillary lymph nodes are not enlarged by CT size criteria. Hilar regions are difficult to definitively evaluate without IV contrast. Distal esophageal wall thickening can be seen with gastroesophageal reflux. Small hiatal hernia.   Lungs/Pleura: Mild central bronchiectasis. Negative for subpleural reticulation, traction bronchiectasis/bronchiolectasis, architectural distortion or honeycombing. Vague peribronchovascular ground-glass nodularity in the lower lobes, similar to 08/19/2017. No pleural fluid. Airway is unremarkable. Mild air trapping.   Upper Abdomen: Liver appears slightly heterogeneous in attenuation. Subcentimeter low-attenuation lesion in the left hepatic lobe is too small to characterize. Visualized portions of the liver, gallbladder, adrenal glands, kidneys, spleen, pancreas, stomach and bowel are grossly unremarkable.   Musculoskeletal:  Degenerative changes in the spine. No worrisome lytic or sclerotic lesions.   IMPRESSION: 1.  Bilateral lower lobe peribronchovascular ground-glass nodularity is unchanged from 08/19/2017 and may be postinfectious in etiology. Difficult to exclude fibrotic nonspecific interstitial pneumonitis. Findings are suggestive of an alternative diagnosis (not UIP) per consensus guidelines: Diagnosis of Idiopathic Pulmonary Fibrosis: An Official ATS/ERS/JRS/ALAT Clinical Practice Guideline. Am Annie Barton Crit Care Med Vol 198, Iss 5, [phone], Mar 13 2017. 2. Mild central bronchiectasis. Mild air trapping is indicative of small airways disease. 3. Liver appears heterogeneous, raising suspicion for steatosis. 4. Aortic atherosclerosis (ICD10-I70.0). Coronary artery calcification.     Electronically Signed   By: Shearon Denis M.D.   On: 02/09/2020 10:59     ROS - per HPI   OV 03/14/2021  Subjective:  Patient ID: Cayetano Coco, female , DOB: 04-04-1956 , age 69 y.o. , MRN: [id number] , ADDRESS: [address] [place] [address] PCP Jearldine Mina, MD Patient Care Team: Jearldine Mina, MD as PCP - General (Internal Medicine) Maire Scot, MD as Consulting Physician (Pulmonary Disease)  This Provider for this visit: Treatment Team:  Attending Provider: Maire Scot, MD    03/14/2021 -   Chief Complaint  Patient presents with   Follow-up    Pt states she has been doing okay since last visit. States that she has had complaints of chest tightness. Denies any problems with her breathing.    HPI Italya Kreuz 68 y.o. -presents for 1 year follow-up.  She continues to be stable.  Symptom burden is mild.  Walking desaturation test is stable.  She feels she has lost some weight but she is not sure if it has been lost intentionally.  In the last 1 year she says she is got some hernia in her back.  She is dealing with hypertension and also low sugars.  Otherwise from a  pulmonary standpoint she is stable.  Cough is no longer an issue.  HPI: 68 year old female followed for chronic cough/irritable larynx syndrome, abnormal CT chest and obstructive sleep apnea  TEST/EVENTS :  High-resolution CT chest February 12, 2022 shows no evidence of interstitial lung disease, was noted some mild bronchiectasis, positive tracheobrochomalacia , groundglass nodularity in the bilateral lobes not significantly changed likely sequela of previous infection  03/05/2022 Follow up : Chronic cough, irritable larynx syndrome, abnormal CT chest, obstructive sleep apnea Patient presents for a 1 year follow-up.  Patient has a multifactorial chronic cough with severe irritable larynx and vocal cord dysfunction.  Patient says over the last year she has been doing okay.  She has episodes where she has severe coughing paroxysms and upper airway wheezing.  She has had a few occasions where she has had to receive steroids.  She says it really does not help that much and makes her blood sugars go up.  She has had an abnormal CT chest in the past that showed mild basilar interstitial lung changes.  High-resolution CT chest done on February 12, 2022 showed no evidence of interstitial lung disease.  Was some mild bronchiectasis.  There was tracheobronchomalacia.  Groundglass nodularity bilateral lobes not significantly changed felt to be a sequela of previous infection.  Patient does have some postnasal drainage.  Patient says her episodes where she will have a severe coughing fit are quite scary at times.  Seems to be worse after prolonged talking and sometimes eating.  Or changes in the weather. Patient says she has underlying sleep apnea.  Wears her CPAP each night.  Patient says she is doing well.  Feels that she benefits from CPAP. Download  shows 87% compliance.  Daily average usage around 5 hours.  Patient is on CPAP auto 4 to 16 cm H2O.  AHI 1.9/hour.  OV 11/23/2023  Subjective:  Patient ID: Gardenia Jump, female , DOB: 07-07-1956 , age 89 y.o. , MRN: [id number] , ADDRESS: [id number] [place] [address] PCP Jearldine Mina, MD Patient Care Team: Jearldine Mina, MD as PCP - General (Internal Medicine) Maire Scot, MD as Consulting Physician (Pulmonary Disease)  This Provider for this visit: Treatment Team:  Attending Provider: Maire Scot, MD    Follow-up multifactorial chronic cough with severe irritable larynx/VCD  follow-up mild basal interstitial lung disease that has been stable for many years. -Alternative pattern.  Most recent CT July 2021 stable since 2019 -> resolved 2023 Former 25 pack smoker   11/23/2023 -   Chief Complaint  Patient presents with   Follow-up    Breathing is doing well. She has has occ non prod cough.      HPI YAGMUR LINDWALL 68 y.o. -returns for follow-up.  Is almost 3 years since I last saw her.  In this time she is lost a lot of weight.  Cough is gone her vocal cord dysfunction is gone.  There is no shortness of breath no cough no wheezing no paroxysmal nocturnal dyspnea no edema.  There is no orthopnea although at baseline she uses 3 pillows for comfort.  Her smoking is in remission.  She is now working as a Multimedia programmer.  She feels great.  She is not seeing any other pulmonary person.  Even in summer 2023 her CT scan showed that her ILD had resolved on all she had was residual bronchiectasis.  She is a former 25 pack smoker     SYMPTOM SCALE - ILD 03/14/2021  11/23/2023   O2 use ra   Shortness of Breath 0 -> 5 scale with 5 being worst (score 6 If unable to do)   At rest 0   Simple tasks - showers, clothes change, eating, shaving 1   Household (dishes, doing bed, laundry) 2   Shopping 1   Walking level at own pace 1   Walking up Stairs 3   Total (30-36) Dyspnea Score 9   How bad is your cough? 1.5   How bad is your fatigue 1   How bad is nausea 0   How bad is vomiting?  0   How bad is diarrhea? 0   How bad  is anxiety? 1   How bad is depression 1        Simple office walk 185 feet x  3 laps goal with forehead probe 2/8/2-019  03/14/2021   O2 used ra ra  Number laps completed x 3  Comments about pace x avg  Resting Pulse Ox/HR 100% and 68/min 100% and 68  Final Pulse Ox/HR 100% and 91/min 98% and 86  Desaturated </= 88% x no  Desaturated <= 3% points Xno no  Got Tachycardic >/= 90/min yes no  Symptoms at end of test x Moderate dyspnea  Miscellaneous comments x x    PFT     Latest Ref Rng & Units 11/08/2014    3:01 PM  PFT Results  FVC-Pre L 2.10   FVC-Predicted Pre % 69   FVC-Post L 2.06   FVC-Predicted Post % 67   Pre FEV1/FVC % % 86   Post FEV1/FCV % % 89   FEV1-Pre L 1.81   FEV1-Predicted Pre % 75  FEV1-Post L 1.84   DLCO uncorrected ml/min/mmHg 20.01   DLCO UNC% % 70   DLVA Predicted % 112   TLC L 3.51   TLC % Predicted % 63   RV % Predicted % 61       IMPRESSION: 1. No evidence of fibrotic interstitial lung disease. 2. Unchanged mild central cylindrical bronchiectasis. 3. 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 4. Peribronchovascular ground-glass nodularity is again seen in the bilateral lower lobes and not significantly changed compared with prior exam, likely sequela of prior infection. 5. Coronary artery calcifications and aortic Atherosclerosis (ICD10-I70.0).     Electronically Signed   By: Avelino Lek M.D.   On: 02/13/2022 10:04  LAB RESULTS last 96 hours No results found.       has a past medical history of Allergy , Anxiety, Arthritis, Asthma, Blood transfusion without reported diagnosis, Cataract, Chronic bronchitis (HCC), Cold extremity without peripheral vascular disease, COPD (chronic obstructive pulmonary disease) (HCC), Environmental allergies, Exertional dyspnea, Gastroparesis, GERD (gastroesophageal reflux disease), Headache(784.0), Heart murmur, Hiatal  hernia, High cholesterol, HTN (hypertension), Obesity, Rotator cuff tear, Sciatic nerve pain, Sciatica, Sleep apnea, Type II diabetes mellitus (HCC), and Vocal cord dysfunction.   reports that she quit smoking about 27 years ago. Her smoking use included cigarettes. She started smoking about 52 years ago. She has a 25 pack-year smoking history. She has been exposed to tobacco smoke. She has never used smokeless tobacco.  Past Surgical History:  Procedure Laterality Date   ABDOMINAL ADHESION SURGERY  ~ 1978   APPENDECTOMY  07/13/1974   CHOLECYSTECTOMY     COLONOSCOPY     NASAL SINUS SURGERY  07/13/1990   OOPHORECTOMY  07/13/1974   w/ovarian cyst excision and appy   SHOULDER ARTHROSCOPY W/ ROTATOR CUFF REPAIR Bilateral 01/28/2012   left   TOE SURGERY     for ingrown [id number] ?left   TOTAL ABDOMINAL HYSTERECTOMY  07/14/1983    Allergies  Allergen Reactions   Lisinopril Cough    Immunization History  Administered Date(s) Administered   Influenza Split 03/31/2011, 05/19/2013, 03/13/2014, 05/16/2015, 04/26/2017   Influenza,inj,Quad PF,6+ Mos 03/13/2016, 03/29/2019   Moderna Covid-19 Fall Seasonal Vaccine 26yrs & older 07/01/2022   Moderna Sars-Covid-2 Vaccination 09/30/2019, 10/28/2019   Pneumococcal Conjugate PCV 7 10/11/2016   Pneumococcal Polysaccharide-23 07/14/2001   Td 07/15/1998   Tdap 10/11/2016   Zoster Recombinant(Shingrix) 05/03/2017   Zoster, Live 11/10/2016    Family History  Problem Relation Age of Onset   Heart disease Mother    Asthma Mother    Heart failure Maternal Grandmother    Asthma Daughter    Lupus Daughter    Asthma Son    Diabetes Other        "everybody on both sides"   Colon cancer Neg Hx    Throat cancer Neg Hx    Stomach cancer Neg Hx    Esophageal cancer Neg Hx    Rectal cancer Neg Hx    Colon polyps Neg Hx    Crohn's disease Neg Hx    Ulcerative colitis Neg Hx      Current Outpatient Medications:    albuterol  (PROVENTIL   HFA;VENTOLIN  HFA) 108 (90 BASE) MCG/ACT inhaler, Inhale 2 puffs into the lungs every 4 (four) hours as needed for wheezing or shortness of breath., Disp: , Rfl:    ALPRAZolam  (XANAX ) 0.5 MG tablet, Take 0.5 mg by mouth 2 (two) times daily as needed for anxiety. ,  Disp: , Rfl:    amLODipine (NORVASC) 5 MG tablet, SMARTSIG:1 Tablet(s) By Mouth Every Evening, Disp: , Rfl:    aspirin  81 MG tablet, Take 81 mg by mouth 2 (two) times a week., Disp: , Rfl:    atorvastatin  (LIPITOR) 20 MG tablet, Take 20 mg by mouth every evening. , Disp: , Rfl:    Azelastine -Fluticasone  137-50 MCG/ACT SUSP, Place 1 spray into both nostrils daily as needed (for congestion)., Disp: , Rfl:    citalopram  (CELEXA ) 10 MG tablet, Take 20 mg by mouth every evening. , Disp: , Rfl:    Continuous Blood Gluc Sensor (FREESTYLE LIBRE 14 DAY SENSOR) MISC, Apply topically as directed., Disp: , Rfl:    fluticasone  (FLONASE ) 50 MCG/ACT nasal spray, Place 2 sprays into both nostrils daily., Disp: 16 g, Rfl: 2   gabapentin  (NEURONTIN ) 300 MG capsule, Take 300 mg by mouth 2 (two) times daily., Disp: , Rfl:    LANTUS SOLOSTAR 100 UNIT/ML Solostar Pen, Inject into the skin., Disp: , Rfl:    linaclotide  (LINZESS ) 290 MCG CAPS capsule, Take 1 capsule (290 mcg total) by mouth daily before breakfast., Disp: 90 capsule, Rfl: 3   loratadine  (CLARITIN ) 10 MG tablet, Take 10 mg by mouth daily as needed for allergies. , Disp: , Rfl:    losartan  (COZAAR ) 100 MG tablet, Take 100 mg by mouth daily. , Disp: , Rfl:    metFORMIN (GLUCOPHAGE) 1000 MG tablet, Take 1,000 mg by mouth 2 (two) times daily. , Disp: , Rfl:    metoCLOPramide  (REGLAN ) 5 MG tablet, TAKE 1 TABLET BY MOUTH BEFORE MEALS AND AT BEDTIME AS NEEDED (Patient taking differently: Take 5 mg by mouth 2 (two) times daily. Take 1 tablet by mouth before meals and at bedtime as needed), Disp: 90 tablet, Rfl: 0   pantoprazole  (PROTONIX ) 40 MG tablet, Take 1 tablet (40 mg total) by mouth 2 (two) times  daily. (Patient taking differently: Take 40 mg by mouth daily.), Disp: 60 tablet, Rfl: 3   traMADol (ULTRAM) 50 MG tablet, Take 50 mg by mouth every 6 (six) hours as needed for pain. , Disp: , Rfl:    zolpidem  (AMBIEN ) 5 MG tablet, Take 5 mg by mouth at bedtime., Disp: , Rfl: 3      Objective:   Vitals:   11/23/23 1136 11/23/23 1137  BP:  108/62  Pulse: 71   SpO2: 100%   Weight:  177 lb (80.3 kg)  Height:  5\' 7"  (1.702 m)    Estimated body mass index is 27.72 kg/m as calculated from the following:   Height as of this encounter: 5\' 7"  (1.702 m).   Weight as of this encounter: 177 lb (80.3 kg).  @WEIGHTCHANGE @  [employer]   11/23/23 1137  Weight: 177 lb (80.3 kg)     Physical Exam   General: No distress. Looks well O2 at rest: no Cane present: no Sitting in wheel chair: no Frail: no Obese: no Neuro: Alert and Oriented x 3. GCS 15. Speech normal Psych: Pleasant Resp:  Barrel Chest - no.  Wheeze - no, Crackles - no, No overt respiratory distress CVS: Normal heart sounds. Murmurs - no Ext: Stigmata of Connective Tissue Disease - no HEENT: Normal upper airway. PEERL +. No post nasal drip        Assessment:       ICD-10-CM   1. Tracheobronchomalacia  J39.8 CT Chest Wo Contrast    2. Bronchiectasis without complication (HCC)  J47.9 CT Chest Wo  Contrast    3. Stopped smoking with greater than 20 pack year history  Z87.891 CT Chest Wo Contrast         Plan:     Patient Instructions  ILD (interstitial lung disease) (HCC)  Bronchiectasis Former > 25 pack smoker  - CT aug 2023 shows this is resolved. All you have  is some residual bronchiectais  Plan  -CT chest without contrast in 6 months   Dyspnea on exertion Chronic cough Irritable larynx syndrome  - stable / resolved   plan - expectant followup   Followup  6 months with APP - if stable and well, can discharge from followup   FOLLOWUP Return in about 6 months (around 05/25/2024) for  with any of the APPS after CT chest.    SIGNATURE    Dr. Maire Scot, M.D., F.C.C.P,  Pulmonary and Critical Care Medicine Staff Physician, [facility] Director - Interstitial Lung Disease  Program  Pulmonary Fibrosis [facility] [place], [place], [address]  Pager: [phone], If no answer or between  15:00h - 7:00h: call [phone] Telephone: [phone]  11:58 AM 11/23/2023

## 2023-11-23 ENCOUNTER — Encounter: Payer: Self-pay | Admitting: Internal Medicine

## 2023-11-23 ENCOUNTER — Ambulatory Visit: Admitting: Internal Medicine

## 2023-11-23 VITALS — BP 108/62 | HR 71 | Ht 67.0 in | Wt 177.0 lb

## 2023-11-23 DIAGNOSIS — J398 Other specified diseases of upper respiratory tract: Secondary | ICD-10-CM

## 2023-11-23 DIAGNOSIS — Z87891 Personal history of nicotine dependence: Secondary | ICD-10-CM

## 2023-11-23 DIAGNOSIS — J479 Bronchiectasis, uncomplicated: Secondary | ICD-10-CM

## 2023-11-23 NOTE — Patient Instructions (Addendum)
 ILD (interstitial lung disease) (HCC)  Bronchiectasis Former > 25 pack smoker  - CT aug 2023 shows this is resolved. All you have  is some residual bronchiectais  Plan  -CT chest without contrast in 6 months   Dyspnea on exertion Chronic cough Irritable larynx syndrome  - stable / resolved   plan - expectant followup   Followup  6 months with APP - if stable and well, can discharge from followup

## 2023-11-25 DIAGNOSIS — J383 Other diseases of vocal cords: Secondary | ICD-10-CM | POA: Diagnosis not present

## 2023-11-25 DIAGNOSIS — Z0000 Encounter for general adult medical examination without abnormal findings: Secondary | ICD-10-CM | POA: Diagnosis not present

## 2023-11-25 DIAGNOSIS — E782 Mixed hyperlipidemia: Secondary | ICD-10-CM | POA: Diagnosis not present

## 2023-11-25 DIAGNOSIS — I10 Essential (primary) hypertension: Secondary | ICD-10-CM | POA: Diagnosis not present

## 2023-11-25 DIAGNOSIS — J449 Chronic obstructive pulmonary disease, unspecified: Secondary | ICD-10-CM | POA: Diagnosis not present

## 2023-11-25 DIAGNOSIS — Z1331 Encounter for screening for depression: Secondary | ICD-10-CM | POA: Diagnosis not present

## 2023-11-25 DIAGNOSIS — M519 Unspecified thoracic, thoracolumbar and lumbosacral intervertebral disc disorder: Secondary | ICD-10-CM | POA: Diagnosis not present

## 2023-11-25 DIAGNOSIS — K3184 Gastroparesis: Secondary | ICD-10-CM | POA: Diagnosis not present

## 2023-11-25 DIAGNOSIS — I700 Atherosclerosis of aorta: Secondary | ICD-10-CM | POA: Diagnosis not present

## 2023-11-25 DIAGNOSIS — G4733 Obstructive sleep apnea (adult) (pediatric): Secondary | ICD-10-CM | POA: Diagnosis not present

## 2023-11-25 DIAGNOSIS — J479 Bronchiectasis, uncomplicated: Secondary | ICD-10-CM | POA: Diagnosis not present

## 2023-11-25 DIAGNOSIS — E1140 Type 2 diabetes mellitus with diabetic neuropathy, unspecified: Secondary | ICD-10-CM | POA: Diagnosis not present

## 2023-11-25 DIAGNOSIS — R269 Unspecified abnormalities of gait and mobility: Secondary | ICD-10-CM | POA: Diagnosis not present

## 2023-12-08 DIAGNOSIS — K219 Gastro-esophageal reflux disease without esophagitis: Secondary | ICD-10-CM | POA: Diagnosis not present

## 2023-12-08 DIAGNOSIS — R490 Dysphonia: Secondary | ICD-10-CM | POA: Diagnosis not present

## 2023-12-24 DIAGNOSIS — R2689 Other abnormalities of gait and mobility: Secondary | ICD-10-CM | POA: Diagnosis not present

## 2023-12-24 DIAGNOSIS — M542 Cervicalgia: Secondary | ICD-10-CM | POA: Diagnosis not present

## 2023-12-29 DIAGNOSIS — R2689 Other abnormalities of gait and mobility: Secondary | ICD-10-CM | POA: Diagnosis not present

## 2023-12-29 DIAGNOSIS — M542 Cervicalgia: Secondary | ICD-10-CM | POA: Diagnosis not present

## 2023-12-31 DIAGNOSIS — J45909 Unspecified asthma, uncomplicated: Secondary | ICD-10-CM | POA: Diagnosis not present

## 2024-01-03 DIAGNOSIS — M542 Cervicalgia: Secondary | ICD-10-CM | POA: Diagnosis not present

## 2024-01-03 DIAGNOSIS — R2689 Other abnormalities of gait and mobility: Secondary | ICD-10-CM | POA: Diagnosis not present

## 2024-01-05 DIAGNOSIS — M542 Cervicalgia: Secondary | ICD-10-CM | POA: Diagnosis not present

## 2024-01-05 DIAGNOSIS — R2689 Other abnormalities of gait and mobility: Secondary | ICD-10-CM | POA: Diagnosis not present

## 2024-01-10 DIAGNOSIS — R2689 Other abnormalities of gait and mobility: Secondary | ICD-10-CM | POA: Diagnosis not present

## 2024-01-10 DIAGNOSIS — M542 Cervicalgia: Secondary | ICD-10-CM | POA: Diagnosis not present

## 2024-01-12 DIAGNOSIS — R2689 Other abnormalities of gait and mobility: Secondary | ICD-10-CM | POA: Diagnosis not present

## 2024-01-12 DIAGNOSIS — M542 Cervicalgia: Secondary | ICD-10-CM | POA: Diagnosis not present

## 2024-01-17 DIAGNOSIS — R2689 Other abnormalities of gait and mobility: Secondary | ICD-10-CM | POA: Diagnosis not present

## 2024-01-17 DIAGNOSIS — M542 Cervicalgia: Secondary | ICD-10-CM | POA: Diagnosis not present

## 2024-01-18 DIAGNOSIS — G4733 Obstructive sleep apnea (adult) (pediatric): Secondary | ICD-10-CM | POA: Diagnosis not present

## 2024-01-20 ENCOUNTER — Ambulatory Visit: Attending: Physician Assistant | Admitting: Speech Pathology

## 2024-01-20 ENCOUNTER — Other Ambulatory Visit: Payer: Self-pay

## 2024-01-20 DIAGNOSIS — R498 Other voice and resonance disorders: Secondary | ICD-10-CM | POA: Insufficient documentation

## 2024-01-20 NOTE — Therapy (Signed)
 OUTPATIENT SPEECH LANGUAGE PATHOLOGY VOICE EVALUATION   Patient Name: [name] MRN: [id number] DOB:02/27/1956, 68 y.o., female Today's Date: 01/20/2024  PCP: Ransom Other, MD REFERRING PROVIDER: Benay Kay, PA-C  END OF SESSION:  End of Session - 01/20/24 1533     Visit Number 1    Number of Visits 10    Date for SLP Re-Evaluation 03/30/24    SLP Start Time 1015    SLP Stop Time  1100    SLP Time Calculation (min) 45 min    Activity Tolerance Patient tolerated treatment well          Past Medical History:  Diagnosis Date   Allergy     seasonal   Anxiety    Arthritis    both shoulders   Asthma    Blood transfusion without reported diagnosis    Cataract    beginning   Chronic bronchitis (HCC)    Cold extremity without peripheral vascular disease    bilaterally; from my knees down into my feet; they get ice cold   COPD (chronic obstructive pulmonary disease) (HCC)    Environmental allergies    year round   Exertional dyspnea    Gastroparesis    GERD (gastroesophageal reflux disease)    Headache(784.0)    Heart murmur    Hiatal hernia    High cholesterol    HTN (hypertension)    Obesity    Rotator cuff tear    Sciatic nerve pain    tailbone down into my legs when it flares up   Sciatica    Sleep apnea    CPAP, sleep study on [facility]   Type II diabetes mellitus (HCC)    Vocal cord dysfunction    I've had it for many years   Past Surgical History:  Procedure Laterality Date   ABDOMINAL ADHESION SURGERY  ~ 1978   APPENDECTOMY  07/13/1974   CHOLECYSTECTOMY     COLONOSCOPY     NASAL SINUS SURGERY  07/13/1990   OOPHORECTOMY  07/13/1974   w/ovarian cyst excision and appy   SHOULDER ARTHROSCOPY W/ ROTATOR CUFF REPAIR Bilateral 01/28/2012   left   TOE SURGERY     for ingrown [id number] ?left   TOTAL ABDOMINAL HYSTERECTOMY  07/14/1983   Patient Active Problem List   Diagnosis Date Noted   Tracheobronchomalacia 03/09/2022   H/O  tooth extraction 11/22/2017   Sinusitis 11/21/2017   Irritable larynx syndrome 09/18/2015   ILD (interstitial lung disease) (HCC) 10/22/2014   Loss of weight 05/08/2014   Rotator cuff tear 01/29/2012   Chest pain 08/26/2011   Allergic rhinitis 08/08/2011   Obstructive sleep apnea 08/08/2011   Chronic cough 02/18/2011   Dyspnea 02/18/2011   Diabetes mellitus type 2, insulin  dependent (HCC) 05/26/2007   ANXIETY, SITUATIONAL 05/26/2007   Essential hypertension 05/26/2007   Asthma, chronic 05/26/2007   GERD 05/26/2007   LIPID DISORDER, HX OF 05/26/2007    Onset date: 12/09/2023 (referral date)  REFERRING DIAG: R49.0 (ICD-10-CM) - Dysphonia  THERAPY DIAG:  Other voice and resonance disorders - Plan: SLP plan of care cert/re-cert  Rationale for Evaluation and Treatment: Rehabilitation  SUBJECTIVE:   SUBJECTIVE STATEMENT: I can sing but I don't like talking, it's too hard Pt accompanied by: self  PERTINENT HISTORY: [name] is a pleasant 67 y.o. female who presents as a new patient for throat concerns. She has a history of vocal cord dysfunction and chronic bronchitis. She is followed by Pulmonary. She enjoys singing and states that  often times the mucous in her throat interferes with vocal quality with occasional soreness in the throat. She takes once daily Protonix , 40mg  in the am. Has been evaluated by Speech Pathology in the past but its been some years.   PAIN:  Are you having pain? No  FALLS: Has patient fallen in last 6 months? Yes, Number of falls: Getting PT at private practice  LIVING ENVIRONMENT: Lives with: lives with their spouse Lives in: House/apartment  PLOF:Level of assistance: Independent with ADLs, Independent with IADLs Employment: Part-time employment CNA/sitter  PATIENT GOALS: To sing without hurting, sing correctly, stop mumbling  OBJECTIVE:  Note: Objective measures were completed at Evaluation unless otherwise noted.  DIAGNOSTIC FINDINGS:  Flexible laryngoscopy shows patent anterior nasal cavity with minimal crusting, no discharge or infection. Nasopharynx normal. Normal base of tongue and supraglottis. Normal vocal cord mobility without vocal cord nodule, mass, polyp or tumor. No muscle tension patterns noted. Hypopharynx normal without mass, pooling of secretions or aspiration. Moderate post-glottic edema. Hoarseness - Primary  Dysphonia   Laryngopharyngeal reflux (LPR)  ENT referred to Dr. Soldatova, however she was unable to scope pt as pt did not tolerate scope (requested ped scope)     COGNITION: Overall cognitive status: Within functional limits for tasks assessed Areas of impairment:    SOCIAL HISTORY: Occupation: Part Hydrologist, sings in choir Water intake: suboptimal Caffeine/alcohol intake: minimal Daily voice use: minimal and sings twice a week  PERCEPTUAL VOICE ASSESSMENT: Voice quality: hoarse and low vocal intensity Vocal abuse: excessive voice use and sings without vocal warm ups, throat clears - 15+ this eval Resonance: normal Respiratory function: thoracic breathing Inspiratory laryngeal obstruction/VCD 4 episodes this eval  OBJECTIVE VOICE ASSESSMENT: Maximum phonation time for sustained ah: 5.75 Conversational pitch average: 130.8 Hz Conversational pitch range: 98-293.7 Hz Conversational loudness average: 65 dB Conversational loudness range: 63-70 dB S/z ratio: 1.2 (Suggestive of dysfunction >1.4)  PATIENT REPORTED OUTCOME MEASURES (PROM): V-RQOL: 22 She rated 4, a lot of difficulty speaking loudly and being heard over noise, having to repeat herself to be understood. She rated a 5, or problem is as bad as it can be having trouble practicing her profession due to voice (singing in choir).                                                                                                                            TREATMENT DATE:    01/20/24: Evaluation completed - Initiated training in SOVTE  to reduce laryngeal tension and as vocal hygiene/warm up for singing in choir. She completed SOVTE with occasional min A, difficulty sustaining hum for greater than 5-6 seconds. Glide with occasional min A, sirens and anthem with occasional min A. Pt reports fatigue with SOVTE and sustained vowel. Cough and stridor triggered by laughing and during sustained vowel, guided pt through relaxed sniff- audible blow (sh) to stop cough and stridor with frequent mod verbal cues and modeling. This was effective at stopping  cough/stridor. Pt instructed in throat clear alternatives and how throat clearing behavior affects voice and vocal folds. She demonstrated use of swallow 2x to avoid throat clearing with verbal cues.     PATIENT EDUCATION: Education details: HEP for voice, vocal hygiene, throat clear alternatives, compensations for VCD, reflux precautions Person educated: Patient Education method: Explanation, Demonstration, Verbal cues, and Handouts Education comprehension: returned demonstration, verbal cues required, and needs further education  HOME EXERCISE PROGRAM: SOVTE, high intensity vocal exercises, resonant voice, vocal function exercises  GOALS: Goals reviewed with patient? Yes  SHORT TERM GOALS: Target date: 02/24/24  Pt will complete HEP for voice with occasional min A Baseline: Goal status: INITIAL  2.  Pt will sustain vowel for 10 seconds Baseline: 5.75 over 5 trials Goal status: INITIAL  3.  Pt will average 68dB  18/20 sentences Baseline:  Goal status: INITIAL  4.  Pt will use throat clear alternatives to clear her throat 2 or less times a session Baseline:  Goal status: INITIAL  5.  Pt will use relaxed sniff-blow to manage VCD cough/stridor 2/4 opportunities with occasional min A Baseline:  Goal status: INITIAL  6.  Pt will maintain clear phonation and 68dB over 5 minute conversation with occasional min A Baseline:  Goal status: INITIAL  LONG TERM GOALS: Target  date: 03/30/24  Pt will complete HEP for voice with rare min A Baseline:  Goal status: INITIAL  2.  Pt will maintain average of 68dB over 12 minute conversation with rare min A Baseline:  Goal status: INITIAL  3.  Pt will have no more than 1 throat clear a session Baseline:  Goal status: INITIAL  4.  Pt will maintain clear phonation over 12 minute conversation Baseline:  Goal status: INITIAL  5.  Pt will improve score on VRQOL Baseline: 22 Goal status: INITIAL    ASSESSMENT:  CLINICAL IMPRESSION: Patient is a 68 y.o. female who was seen today for mild to moderate dysphonia. Her voice affects her ability to sing in the church choir. [name] reports that talking is tiresome and that she looses her voice at times. She reports a tight sensation in her throat. Volume is quit low, affecting intelligibility in this quiet office room. She endorses other tell her she mumbles and talks low. Voice hoarse and strained. Reading passes with intermittent aphonia, hoarse voice, consistent vocal fry and episodes of stridor and cough by the end of the Grandfather passage. She reports talking is harder than singing. Stanza of Amazing Ronnald revealed clear, robust phonation, excellent tune and 75dB-80dB volume, inconsistent with speaking voice. She reports VCD has resolved, however VCD sx present several times during evaluation which responded well to relaxed sniff-blow. Likely a functional component to voice disorder due to inconsistency of speaking vs singing voice. I recommend skilled ST to maximize intelligibility for QOL, to reduce caregiver burden and success in choir. .   OBJECTIVE IMPAIRMENTS: include voice disorder and dysphagia. These impairments are limiting patient from effectively communicating at home and in community and safety when swallowing. Factors affecting potential to achieve goals and functional outcome are n/a.SABRA Patient will benefit from skilled SLP services to address above  impairments and improve overall function.  REHAB POTENTIAL: Good  PLAN:  SLP FREQUENCY: 1-2x/week  SLP DURATION: 10 weeks  PLANNED INTERVENTIONS: Aspiration precaution training, Pharyngeal strengthening exercises, Diet toleration management , Environmental controls, Trials of upgraded texture/liquids, Cueing hierachy, Internal/external aids, Functional tasks, Multimodal communication approach, SLP instruction and feedback, Compensatory strategies, Patient/family education, and [id number]  Treatment of speech (30 or 45 min)     Ahjanae Cassel, Leita Caldron, CCC-SLP 01/20/2024, 3:45 PM

## 2024-01-20 NOTE — Patient Instructions (Signed)
  Semi-occluded vocal tract exercises - these are warm up to do before singing - on your way to practice  10 straw hums (goal is 10 seconds)  10 pitch glides up and down  10 sirens (sets of 4)  National Anthem   This week - work on being aware of throat clearing and try to eliminate this  Instead of clearing your throat, swallow, take a sip  Reflux Gourmet after each meal and before bed  Keep using relaxed sniff-blow (sh) as needed for VCD symptoms

## 2024-01-26 ENCOUNTER — Encounter: Payer: Self-pay | Admitting: Speech Pathology

## 2024-01-26 ENCOUNTER — Ambulatory Visit: Admitting: Speech Pathology

## 2024-01-26 DIAGNOSIS — R498 Other voice and resonance disorders: Secondary | ICD-10-CM

## 2024-01-26 NOTE — Patient Instructions (Signed)
   Remember to hum for 10 seconds in the straw and not just blow - you should hear your voice  hum  In between exercises, complete several relaxed sniff-blows to rest your vocal folds  Hum, glides and down, siren - 10x each  and anthem 1x all with the straw  Remember the straw exercises are the best warm up before singing as well - do them on the way to practice and Sunday service  Practice 10 abdominal breaths  Straw phonation  2 minutes prior to PHoRTE and throughout the day and before any prolonged speaking   Haaaa!!! 10 seconds - hold your natural speaking note   Laryngeal massage Elissa Weinzimmer Full Laryngeal Massage on Youtube  Use a good abdominal breath to power your voice when talking  Keep reducing throat clears and using sniff - blow, swallows and sips to stop the cough/stridor - no talking until the sensation to cough or that you can't breath

## 2024-01-26 NOTE — Therapy (Signed)
 OUTPATIENT SPEECH LANGUAGE PATHOLOGY VOICE TREATMENT   Patient Name: LOCHLYN ZULLO MRN: [id number] DOB:[birth year], 68 y.o., female Today's Date: 01/26/2024  PCP: Ransom Other, MD REFERRING PROVIDER: Benay Kay, PA-C  END OF SESSION:  End of Session - 01/26/24 1015     Visit Number 2    Number of Visits 10    Date for SLP Re-Evaluation 03/30/24    SLP Start Time 1017    SLP Stop Time  1100    SLP Time Calculation (min) 43 min    Activity Tolerance Patient tolerated treatment well          Past Medical History:  Diagnosis Date   Allergy     seasonal   Anxiety    Arthritis    both shoulders   Asthma    Blood transfusion without reported diagnosis    Cataract    beginning   Chronic bronchitis (HCC)    Cold extremity without peripheral vascular disease    bilaterally; from my knees down into my feet; they get ice cold   COPD (chronic obstructive pulmonary disease) (HCC)    Environmental allergies    year round   Exertional dyspnea    Gastroparesis    GERD (gastroesophageal reflux disease)    Headache(784.0)    Heart murmur    Hiatal hernia    High cholesterol    HTN (hypertension)    Obesity    Rotator cuff tear    Sciatic nerve pain    tailbone down into my legs when it flares up   Sciatica    Sleep apnea    CPAP, sleep study on [facility]   Type II diabetes mellitus (HCC)    Vocal cord dysfunction    I've had it for many years   Past Surgical History:  Procedure Laterality Date   ABDOMINAL ADHESION SURGERY  ~ 1978   APPENDECTOMY  07/13/1974   CHOLECYSTECTOMY     COLONOSCOPY     NASAL SINUS SURGERY  07/13/1990   OOPHORECTOMY  07/13/1974   w/ovarian cyst excision and appy   SHOULDER ARTHROSCOPY W/ ROTATOR CUFF REPAIR Bilateral 01/28/2012   left   TOE SURGERY     for ingrown [id number] ?left   TOTAL ABDOMINAL HYSTERECTOMY  07/14/1983   Patient Active Problem List   Diagnosis Date Noted   Tracheobronchomalacia 03/09/2022   H/O  tooth extraction 11/22/2017   Sinusitis 11/21/2017   Irritable larynx syndrome 09/18/2015   ILD (interstitial lung disease) (HCC) 10/22/2014   Loss of weight 05/08/2014   Rotator cuff tear 01/29/2012   Chest pain 08/26/2011   Allergic rhinitis 08/08/2011   Obstructive sleep apnea 08/08/2011   Chronic cough 02/18/2011   Dyspnea 02/18/2011   Diabetes mellitus type 2, insulin  dependent (HCC) 05/26/2007   ANXIETY, SITUATIONAL 05/26/2007   Essential hypertension 05/26/2007   Asthma, chronic 05/26/2007   GERD 05/26/2007   LIPID DISORDER, HX OF 05/26/2007    Onset date: 12/09/2023 (referral date)  REFERRING DIAG: R49.0 (ICD-10-CM) - Dysphonia  THERAPY DIAG:  Other voice and resonance disorders  Rationale for Evaluation and Treatment: Rehabilitation  SUBJECTIVE:   SUBJECTIVE STATEMENT: I've been doing the straw blowing and up and down Pt accompanied by: self  PERTINENT HISTORY: [place] Zou is a pleasant 68 y.o. female who presents as a new patient for throat concerns. She has a history of vocal cord dysfunction and chronic bronchitis. She is followed by Pulmonary. She enjoys singing and states that often times the mucous in her throat interferes  with vocal quality with occasional soreness in the throat. She takes once daily Protonix , 40mg  in the am. Has been evaluated by Speech Pathology in the past but its been some years.   PAIN:  Are you having pain? No  PATIENT GOALS: To sing without hurting, sing correctly, stop mumbling    DIAGNOSTIC FINDINGS:  Normal base of tongue and supraglottis. Normal vocal cord mobility without vocal cord nodule, mass, polyp or tumor. No muscle tension patterns noted. Hypopharynx normal without mass, pooling of secretions or aspiration. Moderate post-glottic edema. Hoarseness - Primary  Dysphonia   Laryngopharyngeal reflux (LPR)  ENT referred to Dr. Soldatova, however she was unable to scope pt as pt did not tolerate scope (requested ped  scope)    SOCIAL HISTORY: Occupation: Part Hydrologist, sings in choir Water intake: suboptimal Caffeine/alcohol intake: minimal Daily voice use: minimal and sings twice a week  PERCEPTUAL VOICE ASSESSMENT: Voice quality: hoarse and low vocal intensity Vocal abuse: excessive voice use and sings without vocal warm ups, throat clears - 15+ this eval Resonance: normal Respiratory function: thoracic breathing Inspiratory laryngeal obstruction/VCD 4 episodes this eval                                                                                                                             TREATMENT DATE:   01/26/24: She demonstrates  SOVTE with verbal cues to hum she was just blowing without phonation. After instruction and demonstration, she completed 10 straw hums for 8-10 seconds each and strong volume. She did required some breaks with sniff-blows. She demonstrated 10 glides up and down, accents and anthem with rare min A after initial modeling, good volume. Sustained vowel in habitual pitch with average of 84dB resulted in inspiratory laryngeal stridor and cough 3/7 attempts. With verbal cues, modeling usually, Jesusa utilized relaxed sniff-blow, swallow and sip to stop ILO/VCD symptoms successfully each episode. Initially she continued to gasp through her mouth despite ongoing cues to sniff with her nose, however this improved to more consistent nasal sniff with subsequent episodes. Volume remains quite low (64-65dB), difficult to hear her in quiet environment. Targeted increased vocal intensity reading short, conversation sentences with modeling and verbal cues to reach 68-70dB where Minyon feels like she is shouting - 2/10 sentences did result in ILO episodes again quickly stopped with cueing nasal sniff and blow. HEP - continue SOVTE and add sentences with increased vocal intensity. Structured speech task generating 3 descriptions of animals using increased vocal intensity to achieve 68-70dB  with occasional min A. She has worked on avoiding throat clears and today improved to 3 throat clears.  01/20/24: Evaluation completed - Initiated training in SOVTE to reduce laryngeal tension and as vocal hygiene/warm up for singing in choir. She completed SOVTE with occasional min A, difficulty sustaining hum for greater than 5-6 seconds. Glide with occasional min A, sirens and anthem with occasional min A. Pt reports fatigue with SOVTE and sustained vowel. Cough and  stridor triggered by laughing and during sustained vowel, guided pt through relaxed sniff- audible blow (sh) to stop cough and stridor with frequent mod verbal cues and modeling. This was effective at stopping cough/stridor. Pt instructed in throat clear alternatives and how throat clearing behavior affects voice and vocal folds. She demonstrated use of swallow 2x to avoid throat clearing with verbal cues.     PATIENT EDUCATION: Education details: HEP for voice, vocal hygiene, throat clear alternatives, compensations for VCD, reflux precautions Person educated: Patient Education method: Explanation, Demonstration, Verbal cues, and Handouts Education comprehension: returned demonstration, verbal cues required, and needs further education  HOME EXERCISE PROGRAM: SOVTE, high intensity vocal exercises, resonant voice, vocal function exercises  GOALS: Goals reviewed with patient? Yes  SHORT TERM GOALS: Target date: 02/24/24  Pt will complete HEP for voice with occasional min A Baseline: Goal status: ONGOING  2.  Pt will sustain vowel for 10 seconds Baseline: 5.75 over 5 trials Goal status: ONGOING  3.  Pt will average 68dB  18/20 sentences Baseline:  Goal status: ONGOING  4.  Pt will use throat clear alternatives to clear her throat 2 or less times a session Baseline:  Goal status: ONGOING  5.  Pt will use relaxed sniff-blow to manage VCD cough/stridor 2/4 opportunities with occasional min A Baseline:  Goal status:  ONGOING  6.  Pt will maintain clear phonation and 68dB over 5 minute conversation with occasional min A Baseline:  Goal status: ONGOING  LONG TERM GOALS: Target date: 03/30/24  Pt will complete HEP for voice with rare min A Baseline:  Goal status: INITIAL  2.  Pt will maintain average of 68dB over 12 minute conversation with rare min A Baseline:  Goal status: ONGOING  3.  Pt will have no more than 1 throat clear a session Baseline:  Goal status: ONGOING  4.  Pt will maintain clear phonation over 12 minute conversation Baseline:  Goal status: ONGOING  5.  Pt will improve score on VRQOL Baseline: 22 Goal status: ONGOING    ASSESSMENT:  CLINICAL IMPRESSION: Patient is a 68 y.o. female who was seen today for mild to moderate dysphonia. Her voice affects her ability to sing in the church choir. Marinell reports that talking is tiresome and that she looses her voice at times. She reports a tight sensation in her throat. Volume is quit low, affecting intelligibility in this quiet office room. She endorses other tell her she mumbles and talks low. Voice hoarse and strained. Reading passes with intermittent aphonia, hoarse voice, consistent vocal fry and episodes of stridor and cough by the end of the Grandfather passage. She reports talking is harder than singing. Stanza of [employer] Ronnald revealed clear, robust phonation, excellent tune and 75dB-80dB volume, inconsistent with speaking voice. She reports VCD has resolved, however VCD sx present several times during evaluation which responded well to relaxed sniff-blow. Likely a functional component to voice disorder due to inconsistency of speaking vs singing voice. I recommend skilled ST to maximize intelligibility for QOL, to reduce caregiver burden and success in choir. .   OBJECTIVE IMPAIRMENTS: include voice disorder and dysphagia. These impairments are limiting patient from effectively communicating at home and in community and safety  when swallowing. Factors affecting potential to achieve goals and functional outcome are n/a.SABRA Patient will benefit from skilled SLP services to address above impairments and improve overall function.  REHAB POTENTIAL: Good  PLAN:  SLP FREQUENCY: 1-2x/week  SLP DURATION: 10 weeks  PLANNED INTERVENTIONS: Aspiration precaution  training, Pharyngeal strengthening exercises, Diet toleration management , Environmental controls, Trials of upgraded texture/liquids, Cueing hierachy, Internal/external aids, Functional tasks, Multimodal communication approach, SLP instruction and feedback, Compensatory strategies, Patient/family education, and [id number] Treatment of speech (30 or 45 min)     Kamori Kitchens, Leita Caldron, CCC-SLP 01/26/2024, 11:59 AM

## 2024-01-31 ENCOUNTER — Ambulatory Visit: Admitting: Speech Pathology

## 2024-01-31 ENCOUNTER — Encounter: Payer: Self-pay | Admitting: Speech Pathology

## 2024-01-31 DIAGNOSIS — R498 Other voice and resonance disorders: Secondary | ICD-10-CM | POA: Diagnosis not present

## 2024-01-31 NOTE — Therapy (Signed)
 OUTPATIENT SPEECH LANGUAGE PATHOLOGY VOICE TREATMENT   Patient Name: [name] MRN: [id number] DOB:04/29/1956, 68 y.o., female Today's Date: 01/31/2024  PCP: Ransom Other, MD REFERRING PROVIDER: Benay Kay, PA-C  END OF SESSION:  End of Session - 01/31/24 0937     Visit Number 3    Number of Visits 10    Date for SLP Re-Evaluation 03/30/24    SLP Start Time 0935    SLP Stop Time  1015    SLP Time Calculation (min) 40 min    Activity Tolerance Patient tolerated treatment well          Past Medical History:  Diagnosis Date   Allergy     seasonal   Anxiety    Arthritis    both shoulders   Asthma    Blood transfusion without reported diagnosis    Cataract    beginning   Chronic bronchitis (HCC)    Cold extremity without peripheral vascular disease    bilaterally; from my knees down into my feet; they get ice cold   COPD (chronic obstructive pulmonary disease) (HCC)    Environmental allergies    year round   Exertional dyspnea    Gastroparesis    GERD (gastroesophageal reflux disease)    Headache(784.0)    Heart murmur    Hiatal hernia    High cholesterol    HTN (hypertension)    Obesity    Rotator cuff tear    Sciatic nerve pain    tailbone down into my legs when it flares up   Sciatica    Sleep apnea    CPAP, sleep study on [facility]   Type II diabetes mellitus (HCC)    Vocal cord dysfunction    I've had it for many years   Past Surgical History:  Procedure Laterality Date   ABDOMINAL ADHESION SURGERY  ~ 1978   APPENDECTOMY  07/13/1974   CHOLECYSTECTOMY     COLONOSCOPY     NASAL SINUS SURGERY  07/13/1990   OOPHORECTOMY  07/13/1974   w/ovarian cyst excision and appy   SHOULDER ARTHROSCOPY W/ ROTATOR CUFF REPAIR Bilateral 01/28/2012   left   TOE SURGERY     for ingrown [id number] ?left   TOTAL ABDOMINAL HYSTERECTOMY  07/14/1983   Patient Active Problem List   Diagnosis Date Noted   Tracheobronchomalacia 03/09/2022   H/O  tooth extraction 11/22/2017   Sinusitis 11/21/2017   Irritable larynx syndrome 09/18/2015   ILD (interstitial lung disease) (HCC) 10/22/2014   Loss of weight 05/08/2014   Rotator cuff tear 01/29/2012   Chest pain 08/26/2011   Allergic rhinitis 08/08/2011   Obstructive sleep apnea 08/08/2011   Chronic cough 02/18/2011   Dyspnea 02/18/2011   Diabetes mellitus type 2, insulin  dependent (HCC) 05/26/2007   ANXIETY, SITUATIONAL 05/26/2007   Essential hypertension 05/26/2007   Asthma, chronic 05/26/2007   GERD 05/26/2007   LIPID DISORDER, HX OF 05/26/2007    Onset date: 12/09/2023 (referral date)  REFERRING DIAG: R49.0 (ICD-10-CM) - Dysphonia  THERAPY DIAG:  Other voice and resonance disorders  Rationale for Evaluation and Treatment: Rehabilitation  SUBJECTIVE:   SUBJECTIVE STATEMENT: I've been doing the straw blowing and up and down Pt accompanied by: self  PERTINENT HISTORY: [name] is a pleasant 68 y.o. female who presents as a new patient for throat concerns. She has a history of vocal cord dysfunction and chronic bronchitis. She is followed by Pulmonary. She enjoys singing and states that often times the mucous in her throat interferes  with vocal quality with occasional soreness in the throat. She takes once daily Protonix , 40mg  in the am. Has been evaluated by Speech Pathology in the past but its been some years.   PAIN:  Are you having pain? No  PATIENT GOALS: To sing without hurting, sing correctly, stop mumbling    DIAGNOSTIC FINDINGS:  Normal base of tongue and supraglottis. Normal vocal cord mobility without vocal cord nodule, mass, polyp or tumor. No muscle tension patterns noted. Hypopharynx normal without mass, pooling of secretions or aspiration. Moderate post-glottic edema. Hoarseness - Primary  Dysphonia   Laryngopharyngeal reflux (LPR)  ENT referred to Dr. Soldatova, however she was unable to scope pt as pt did not tolerate scope (requested ped  scope)    SOCIAL HISTORY: Occupation: Part Hydrologist, sings in choir Water intake: suboptimal Caffeine/alcohol intake: minimal Daily voice use: minimal and sings twice a week  PERCEPTUAL VOICE ASSESSMENT: Voice quality: hoarse and low vocal intensity Vocal abuse: excessive voice use and sings without vocal warm ups, throat clears - 15+ this eval Resonance: normal Respiratory function: thoracic breathing Inspiratory laryngeal obstruction/VCD 4 episodes this eval                                                                                                                             TREATMENT DATE:   01/31/24: [name] started alginate last week. She has been on gabapentin  for 1 month due to bronchitis. She reports some reduction in coughing, however she still has frequent episodes of throat tightness and prolonged cough. SOVTE attempted -  5 trials of straw hum all resulted in stridor and cough - VCD sx managed with relaxed abdominal sniff-blow with intermittent dry swallows with frequent verbal cues and modeling to use nasal sniff as Heather often reverted to rapid oral panting increasing stridor. This session focused on managing VCD symptoms as talking with volume above 68dB and laughing all resulted in inspiratory stridor and coughing. She endorses laughing, change in temp, scents and talking all trigger cough and difficulty breathing. I requested her spouse attend next session and encouraged her to get a referral to  a laryngologist.   01/26/24: She demonstrates  SOVTE with verbal cues to hum she was just blowing without phonation. After instruction and demonstration, she completed 10 straw hums for 8-10 seconds each and strong volume. She did required some breaks with sniff-blows. She demonstrated 10 glides up and down, accents and anthem with rare min A after initial modeling, good volume. Sustained vowel in habitual pitch with average of 84dB resulted in inspiratory laryngeal stridor and  cough 3/7 attempts. With verbal cues, modeling usually, Cecelia utilized relaxed sniff-blow, swallow and sip to stop ILO/VCD symptoms successfully each episode. Initially she continued to gasp through her mouth despite ongoing cues to sniff with her nose, however this improved to more consistent nasal sniff with subsequent episodes. Volume remains quite low (64-65dB), difficult to hear her in quiet environment. Targeted increased vocal intensity  reading short, conversation sentences with modeling and verbal cues to reach 68-70dB where Alany feels like she is shouting - 2/10 sentences did result in ILO episodes again quickly stopped with cueing nasal sniff and blow. HEP - continue SOVTE and add sentences with increased vocal intensity. Structured speech task generating 3 descriptions of animals using increased vocal intensity to achieve 68-70dB with occasional min A. She has worked on avoiding throat clears and today improved to 3 throat clears.  01/20/24: Evaluation completed - Initiated training in SOVTE to reduce laryngeal tension and as vocal hygiene/warm up for singing in choir. She completed SOVTE with occasional min A, difficulty sustaining hum for greater than 5-6 seconds. Glide with occasional min A, sirens and anthem with occasional min A. Pt reports fatigue with SOVTE and sustained vowel. Cough and stridor triggered by laughing and during sustained vowel, guided pt through relaxed sniff- audible blow (sh) to stop cough and stridor with frequent mod verbal cues and modeling. This was effective at stopping cough/stridor. Pt instructed in throat clear alternatives and how throat clearing behavior affects voice and vocal folds. She demonstrated use of swallow 2x to avoid throat clearing with verbal cues.     PATIENT EDUCATION: Education details: HEP for voice, vocal hygiene, throat clear alternatives, compensations for VCD, reflux precautions Person educated: Patient Education method: Explanation,  Demonstration, Verbal cues, and Handouts Education comprehension: returned demonstration, verbal cues required, and needs further education  HOME EXERCISE PROGRAM: SOVTE, high intensity vocal exercises, resonant voice, vocal function exercises  GOALS: Goals reviewed with patient? Yes  SHORT TERM GOALS: Target date: 02/24/24  Pt will complete HEP for voice with occasional min A Baseline: Goal status: ONGOING  2.  Pt will sustain vowel for 10 seconds Baseline: 5.75 over 5 trials Goal status: ONGOING  3.  Pt will average 68dB  18/20 sentences Baseline:  Goal status: ONGOING  4.  Pt will use throat clear alternatives to clear her throat 2 or less times a session Baseline:  Goal status: ONGOING  5.  Pt will use relaxed sniff-blow to manage VCD cough/stridor 2/4 opportunities with occasional min A Baseline:  Goal status: ONGOING  6.  Pt will maintain clear phonation and 68dB over 5 minute conversation with occasional min A Baseline:  Goal status: ONGOING  LONG TERM GOALS: Target date: 03/30/24  Pt will complete HEP for voice with rare min A Baseline:  Goal status: INITIAL  2.  Pt will maintain average of 68dB over 12 minute conversation with rare min A Baseline:  Goal status: ONGOING  3.  Pt will have no more than 1 throat clear a session Baseline:  Goal status: ONGOING  4.  Pt will maintain clear phonation over 12 minute conversation Baseline:  Goal status: ONGOING  5.  Pt will improve score on VRQOL Baseline: 22 Goal status: ONGOING    ASSESSMENT:  CLINICAL IMPRESSION: Patient is a 68 y.o. female who was seen today for mild to moderate dysphonia. Her voice affects her ability to sing in the church choir. Katlin reports that talking is tiresome and that she looses her voice at times. She reports a tight sensation in her throat. Volume is quit low, affecting intelligibility in this quiet office room. She endorses other tell her she mumbles and talks low. Voice  hoarse and strained. Reading passes with intermittent aphonia, hoarse voice, consistent vocal fry and episodes of stridor and cough by the end of the Grandfather passage. She reports talking is harder than singing. Stanza of  Amazing Ronnald revealed clear, robust phonation, excellent tune and 75dB-80dB volume, inconsistent with speaking voice. She reports VCD has resolved, however VCD sx present several times during evaluation which responded well to relaxed sniff-blow. Likely a functional component to voice disorder due to inconsistency of speaking vs singing voice. I recommend skilled ST to maximize intelligibility for QOL, to reduce caregiver burden and success in choir. .   OBJECTIVE IMPAIRMENTS: include voice disorder and dysphagia. These impairments are limiting patient from effectively communicating at home and in community and safety when swallowing. Factors affecting potential to achieve goals and functional outcome are n/a.SABRA Patient will benefit from skilled SLP services to address above impairments and improve overall function.  REHAB POTENTIAL: Good  PLAN:  SLP FREQUENCY: 1-2x/week  SLP DURATION: 10 weeks  PLANNED INTERVENTIONS: Aspiration precaution training, Pharyngeal strengthening exercises, Diet toleration management , Environmental controls, Trials of upgraded texture/liquids, Cueing hierachy, Internal/external aids, Functional tasks, Multimodal communication approach, SLP instruction and feedback, Compensatory strategies, Patient/family education, and [id number] Treatment of speech (30 or 45 min)     Marnee Sherrard, Leita Caldron, CCC-SLP 01/31/2024, 11:53 AM

## 2024-01-31 NOTE — Patient Instructions (Addendum)
   Dr. Jorge Slate is a laryngologist who specializes in the throat and upper airway   [phone]  Superior laryngeal nerve block  Get referral to Dr. Okey  Be mindful if the Reflux Gourmet is helping you at night  Great job using the sniff-blow and swallow to help stop the coughing - make sure you use your nose  When you get an attack, you are forgetting to sniff and you inhale through your mouth like a pant  The nose sniff is what pulls apart the vocal folds and stops the vocal cord spasm and helps you breathe in  If you need to do 2 sniffs and a sh blow that is good as well  In through the nose, out  through the mouth

## 2024-02-07 ENCOUNTER — Telehealth: Payer: Self-pay | Admitting: Speech Pathology

## 2024-02-07 ENCOUNTER — Encounter: Payer: Self-pay | Admitting: Speech Pathology

## 2024-02-07 ENCOUNTER — Ambulatory Visit: Admitting: Speech Pathology

## 2024-02-07 DIAGNOSIS — R498 Other voice and resonance disorders: Secondary | ICD-10-CM | POA: Diagnosis not present

## 2024-02-07 NOTE — Therapy (Signed)
 OUTPATIENT SPEECH LANGUAGE PATHOLOGY VOICE TREATMENT   Patient Name: [name] MRN: [id number] DOB:[birth year], 68 y.o., female Today's Date: 02/07/2024  PCP: Kristin Other, MD REFERRING PROVIDER: Benay Kay, PA-C  END OF SESSION:  End of Session - 02/07/24 1022     Visit Number 4    Number of Visits 10    Date for SLP Re-Evaluation 03/30/24    SLP Start Time 1015    SLP Stop Time  1100    SLP Time Calculation (min) 45 min    Activity Tolerance Patient tolerated treatment well          Past Medical History:  Diagnosis Date   Allergy     seasonal   Anxiety    Arthritis    both shoulders   Asthma    Blood transfusion without reported diagnosis    Cataract    beginning   Chronic bronchitis (HCC)    Cold extremity without peripheral vascular disease    bilaterally; from my knees down into my feet; they get ice cold   COPD (chronic obstructive pulmonary disease) (HCC)    Environmental allergies    year round   Exertional dyspnea    Gastroparesis    GERD (gastroesophageal reflux disease)    Headache(784.0)    Heart murmur    Hiatal hernia    High cholesterol    HTN (hypertension)    Obesity    Rotator cuff tear    Sciatic nerve pain    tailbone down into my legs when it flares up   Sciatica    Sleep apnea    CPAP, sleep study on [facility]   Type II diabetes mellitus (HCC)    Vocal cord dysfunction    I've had it for many years   Past Surgical History:  Procedure Laterality Date   ABDOMINAL ADHESION SURGERY  ~ 1978   APPENDECTOMY  07/13/1974   CHOLECYSTECTOMY     COLONOSCOPY     NASAL SINUS SURGERY  07/13/1990   OOPHORECTOMY  07/13/1974   w/ovarian cyst excision and appy   SHOULDER ARTHROSCOPY W/ ROTATOR CUFF REPAIR Bilateral 01/28/2012   left   TOE SURGERY     for ingrown [id number] ?left   TOTAL ABDOMINAL HYSTERECTOMY  07/14/1983   Patient Active Problem List   Diagnosis Date Noted   Tracheobronchomalacia 03/09/2022   H/O  tooth extraction 11/22/2017   Sinusitis 11/21/2017   Irritable larynx syndrome 09/18/2015   ILD (interstitial lung disease) (HCC) 10/22/2014   Loss of weight 05/08/2014   Rotator cuff tear 01/29/2012   Chest pain 08/26/2011   Allergic rhinitis 08/08/2011   Obstructive sleep apnea 08/08/2011   Chronic cough 02/18/2011   Dyspnea 02/18/2011   Diabetes mellitus type 2, insulin  dependent (HCC) 05/26/2007   ANXIETY, SITUATIONAL 05/26/2007   Essential hypertension 05/26/2007   Asthma, chronic 05/26/2007   GERD 05/26/2007   LIPID DISORDER, HX OF 05/26/2007    Onset date: 12/09/2023 (referral date)  REFERRING DIAG: R49.0 (ICD-10-CM) - Dysphonia  THERAPY DIAG:  Miles voice and resonance disorders  Rationale for Evaluation and Treatment: Rehabilitation  SUBJECTIVE:   SUBJECTIVE STATEMENT: I haven't been re: coughing Pt accompanied by: self  PERTINENT HISTORY: [name] is a pleasant 68 y.o. female who presents as a new patient for throat concerns. She has a history of vocal cord dysfunction and chronic bronchitis. She is followed by Pulmonary. She enjoys singing and states that often times the mucous in her throat interferes with vocal quality with occasional  soreness in the throat. She takes once daily Protonix , 40mg  in the am. Has been evaluated by Speech Pathology in the past but its been some years.   PAIN:  Are you having pain? No  PATIENT GOALS: To sing without hurting, sing correctly, stop mumbling    DIAGNOSTIC FINDINGS:  Normal base of tongue and supraglottis. Normal vocal cord mobility without vocal cord nodule, mass, polyp or tumor. No muscle tension patterns noted. Hypopharynx normal without mass, pooling of secretions or aspiration. Moderate post-glottic edema. Hoarseness - Primary  Dysphonia   Laryngopharyngeal reflux (LPR)  ENT referred to [name], however she was unable to scope pt as pt did not tolerate scope (requested ped scope)    SOCIAL  HISTORY: Occupation: Part Hydrologist, sings in choir Water intake: suboptimal Caffeine/alcohol intake: minimal Daily voice use: minimal and sings twice a week  PERCEPTUAL VOICE ASSESSMENT: Voice quality: hoarse and low vocal intensity Vocal abuse: excessive voice use and sings without vocal warm ups, throat clears - 15+ this eval Resonance: normal Respiratory function: thoracic breathing Inspiratory laryngeal obstruction/VCD 4 episodes this eval                                                                                                                             TREATMENT DATE:    02/07/24: [name] reports reduced coughing and use of relaxed sniff-blow with success. Targeted SOVTE - results in s/s of ILO/VCD  -cough and stridor with cued relaxed sniff-blow - Targeted volume with high intensity voice exercises sustained ah, glide with 3 episodes of VCD/ILO again managed with relaxed sniff blow and swallow. Reading 8-9 word sentences with averaged of 74dB with usual min to mod A,  no coughing or stridor with increased volume.  Frequent mod verbal cues and modeling to carryover WNL volume in conversation. She reports reduced coughing at night. I have asked her to get a referral to a laryngologist to evaluate possible Vocal cord dysfunction/inspiratory laryngeal obstruction ( [name]).   7/21/25suppressed : [name] started alginate last week. She has been on gabapentin  for 1 month due to bronchitis. She reports some reduction in coughing, however she still has frequent episodes of throat tightness and prolonged cough. SOVTE attempted -  5 trials of straw hum all resulted in stridor and cough - VCD sx managed with relaxed abdominal sniff-blow with intermittent dry swallows with frequent verbal cues and modeling to use nasal sniff as [name] often reverted to rapid oral panting increasing stridor. This session focused on managing VCD symptoms as talking with volume above 68dB and laughing all  resulted in inspiratory stridor and coughing. She endorses laughing, change in temp, scents and talking all trigger cough and difficulty breathing. I requested her spouse attend next session and encouraged her to get a referral to  a laryngologist.   01/26/24: She demonstrates  SOVTE with verbal cues to hum she was just blowing without phonation. After instruction and demonstration, she completed 10 straw  hums for 8-10 seconds each and strong volume. She did required some breaks with sniff-blows. She demonstrated 10 glides up and down, accents and anthem with rare min A after initial modeling, good volume. Sustained vowel in habitual pitch with average of 84dB resulted in inspiratory laryngeal stridor and cough 3/7 attempts. With verbal cues, modeling usually, Kaedence utilized relaxed sniff-blow, swallow and sip to stop ILO/VCD symptoms successfully each episode. Initially she continued to gasp through her mouth despite ongoing cues to sniff with her nose, however this improved to more consistent nasal sniff with subsequent episodes. Volume remains quite low (64-65dB), difficult to hear her in quiet environment. Targeted increased vocal intensity reading short, conversation sentences with modeling and verbal cues to reach 68-70dB where Shontay feels like she is shouting - 2/10 sentences did result in ILO episodes again quickly stopped with cueing nasal sniff and blow. HEP - continue SOVTE and add sentences with increased vocal intensity. Structured speech task generating 3 descriptions of animals using increased vocal intensity to achieve 68-70dB with occasional min A. She has worked on avoiding throat clears and today improved to 3 throat clears.  01/20/24: Evaluation completed - Initiated training in SOVTE to reduce laryngeal tension and as vocal hygiene/warm up for singing in choir. She completed SOVTE with occasional min A, difficulty sustaining hum for greater than 5-6 seconds. Glide with occasional min A,  sirens and anthem with occasional min A. Pt reports fatigue with SOVTE and sustained vowel. Cough and stridor triggered by laughing and during sustained vowel, guided pt through relaxed sniff- audible blow (sh) to stop cough and stridor with frequent mod verbal cues and modeling. This was effective at stopping cough/stridor. Pt instructed in throat clear alternatives and how throat clearing behavior affects voice and vocal folds. She demonstrated use of swallow 2x to avoid throat clearing with verbal cues.     PATIENT EDUCATION: Education details: HEP for voice, vocal hygiene, throat clear alternatives, compensations for VCD, reflux precautions Person educated: Patient Education method: Explanation, Demonstration, Verbal cues, and Handouts Education comprehension: returned demonstration, verbal cues required, and needs further education  HOME EXERCISE PROGRAM: SOVTE, high intensity vocal exercises, resonant voice, vocal function exercises  GOALS: Goals reviewed with patient? Yes  SHORT TERM GOALS: Target date: 02/24/24  Pt will complete HEP for voice with occasional min A Baseline: Goal status: ONGOING  2.  Pt will sustain vowel for 10 seconds Baseline: 5.75 over 5 trials Goal status: ONGOING  3.  Pt will average 68dB  18/20 sentences Baseline:  Goal status: ONGOING  4.  Pt will use throat clear alternatives to clear her throat 2 or less times a session Baseline:  Goal status: ONGOING  5.  Pt will use relaxed sniff-blow to manage VCD cough/stridor 2/4 opportunities with occasional min A Baseline:  Goal status: ONGOING  6.  Pt will maintain clear phonation and 68dB over 5 minute conversation with occasional min A Baseline:  Goal status: ONGOING  LONG TERM GOALS: Target date: 03/30/24  Pt will complete HEP for voice with rare min A Baseline:  Goal status: INITIAL  2.  Pt will maintain average of 68dB over 12 minute conversation with rare min A Baseline:  Goal status:  ONGOING  3.  Pt will have no more than 1 throat clear a session Baseline:  Goal status: ONGOING  4.  Pt will maintain clear phonation over 12 minute conversation Baseline:  Goal status: ONGOING  5.  Pt will improve score on VRQOL Baseline: 22 Goal  status: ONGOING    ASSESSMENT:  CLINICAL IMPRESSION: Patient is a 68 y.o. female who was seen today for mild to moderate dysphonia. Her voice affects her ability to sing in the church choir. Talani reports that talking is tiresome and that she looses her voice at times. She reports a tight sensation in her throat. Volume is quit low, affecting intelligibility in this quiet office room. She endorses Miles tell her she mumbles and talks low. Voice hoarse and strained. Reading passes with intermittent aphonia, hoarse voice, consistent vocal fry and episodes of stridor and cough by the end of the Grandfather passage. She reports talking is harder than singing. Stanza of Amazing Ronnald revealed clear, robust phonation, excellent tune and 75dB-80dB volume, inconsistent with speaking voice. She reports VCD has resolved, however VCD sx present several times during evaluation which responded well to relaxed sniff-blow. Likely a functional component to voice disorder due to inconsistency of speaking vs singing voice. I recommend skilled ST to maximize intelligibility for QOL, to reduce caregiver burden and success in choir. .   OBJECTIVE IMPAIRMENTS: include voice disorder and dysphagia. These impairments are limiting patient from effectively communicating at home and in community and safety when swallowing. Factors affecting potential to achieve goals and functional outcome are n/a.SABRA Patient will benefit from skilled SLP services to address above impairments and improve overall function.  REHAB POTENTIAL: Good  PLAN:  SLP FREQUENCY: 1-2x/week  SLP DURATION: 10 weeks  PLANNED INTERVENTIONS: Aspiration precaution training, Pharyngeal strengthening  exercises, Diet toleration management , Environmental controls, Trials of upgraded texture/liquids, Cueing hierachy, Internal/external aids, Functional tasks, Multimodal communication approach, SLP instruction and feedback, Compensatory strategies, Patient/family education, and [id number] Treatment of speech (30 or 45 min)     Liviana Mills, Leita Caldron, CCC-SLP 02/07/2024, 10:49 AM

## 2024-02-10 ENCOUNTER — Ambulatory Visit: Admitting: Speech Pathology

## 2024-02-22 ENCOUNTER — Encounter: Admitting: Speech Pathology

## 2024-02-23 ENCOUNTER — Encounter: Payer: Self-pay | Admitting: Speech Pathology

## 2024-02-23 ENCOUNTER — Ambulatory Visit: Attending: Physician Assistant | Admitting: Speech Pathology

## 2024-02-23 DIAGNOSIS — R498 Other voice and resonance disorders: Secondary | ICD-10-CM | POA: Insufficient documentation

## 2024-02-23 NOTE — Patient Instructions (Addendum)
   Yes, you should try to suppress your cough when it is  from fumes, smells, temperature changes, talking, laughing, singing as this is not normal

## 2024-02-23 NOTE — Therapy (Signed)
 OUTPATIENT SPEECH LANGUAGE PATHOLOGY VOICE TREATMENT   Patient Name: [name] MRN: [id number] DOB:[birth year], 68 y.o., female Today's Date: 02/23/2024  PCP: Ransom Other, MD REFERRING PROVIDER: Benay Kay, PA-C  END OF SESSION:  End of Session - 02/23/24 1026     Visit Number 5    Number of Visits 10    Date for SLP Re-Evaluation 03/30/24    SLP Start Time 1020    SLP Stop Time  1050    SLP Time Calculation (min) 30 min    Activity Tolerance Patient tolerated treatment well          Past Medical History:  Diagnosis Date   Allergy     seasonal   Anxiety    Arthritis    both shoulders   Asthma    Blood transfusion without reported diagnosis    Cataract    beginning   Chronic bronchitis (HCC)    Cold extremity without peripheral vascular disease    bilaterally; from my knees down into my feet; they get ice cold   COPD (chronic obstructive pulmonary disease) (HCC)    Environmental allergies    year round   Exertional dyspnea    Gastroparesis    GERD (gastroesophageal reflux disease)    Headache(784.0)    Heart murmur    Hiatal hernia    High cholesterol    HTN (hypertension)    Obesity    Rotator cuff tear    Sciatic nerve pain    tailbone down into my legs when it flares up   Sciatica    Sleep apnea    CPAP, sleep study on [facility]   Type II diabetes mellitus (HCC)    Vocal cord dysfunction    I've had it for many years   Past Surgical History:  Procedure Laterality Date   ABDOMINAL ADHESION SURGERY  ~ 1978   APPENDECTOMY  07/13/1974   CHOLECYSTECTOMY     COLONOSCOPY     NASAL SINUS SURGERY  07/13/1990   OOPHORECTOMY  07/13/1974   w/ovarian cyst excision and appy   SHOULDER ARTHROSCOPY W/ ROTATOR CUFF REPAIR Bilateral 01/28/2012   left   TOE SURGERY     for ingrown [id number] ?left   TOTAL ABDOMINAL HYSTERECTOMY  07/14/1983   Patient Active Problem List   Diagnosis Date Noted   Tracheobronchomalacia 03/09/2022   H/O  tooth extraction 11/22/2017   Sinusitis 11/21/2017   Irritable larynx syndrome 09/18/2015   ILD (interstitial lung disease) (HCC) 10/22/2014   Loss of weight 05/08/2014   Rotator cuff tear 01/29/2012   Chest pain 08/26/2011   Allergic rhinitis 08/08/2011   Obstructive sleep apnea 08/08/2011   Chronic cough 02/18/2011   Dyspnea 02/18/2011   Diabetes mellitus type 2, insulin  dependent (HCC) 05/26/2007   ANXIETY, SITUATIONAL 05/26/2007   Essential hypertension 05/26/2007   Asthma, chronic 05/26/2007   GERD 05/26/2007   LIPID DISORDER, HX OF 05/26/2007    Onset date: 12/09/2023 (referral date)  REFERRING DIAG: R49.0 (ICD-10-CM) - Dysphonia  THERAPY DIAG:  Other voice and resonance disorders  Rationale for Evaluation and Treatment: Rehabilitation  SUBJECTIVE:   SUBJECTIVE STATEMENT: I went on vacation and had coughing and falling Pt accompanied by: self  PERTINENT HISTORY: [name] is a pleasant 68 y.o. female who presents as a new patient for throat concerns. She has a history of vocal cord dysfunction and chronic bronchitis. She is followed by Pulmonary. She enjoys singing and states that often times the mucous in her throat interferes with  vocal quality with occasional soreness in the throat. She takes once daily Protonix , 40mg  in the am. Has been evaluated by Speech Pathology in the past but its been some years.   PAIN:  Are you having pain? No  PATIENT GOALS: To sing without hurting, sing correctly, stop mumbling    DIAGNOSTIC FINDINGS:  Normal base of tongue and supraglottis. Normal vocal cord mobility without vocal cord nodule, mass, polyp or tumor. No muscle tension patterns noted. Hypopharynx normal without mass, pooling of secretions or aspiration. Moderate post-glottic edema. Hoarseness - Primary  Dysphonia   Laryngopharyngeal reflux (LPR)  ENT referred to Dr. Soldatova, however she was unable to scope pt as pt did not tolerate scope (requested ped scope)     SOCIAL HISTORY: Occupation: Part Hydrologist, sings in choir Water intake: suboptimal Caffeine/alcohol intake: minimal Daily voice use: minimal and sings twice a week  PERCEPTUAL VOICE ASSESSMENT: Voice quality: hoarse and low vocal intensity Vocal abuse: excessive voice use and sings without vocal warm ups, throat clears - 15+ this eval Resonance: normal Respiratory function: thoracic breathing Inspiratory laryngeal obstruction/VCD 4 episodes this eval                                                                                                                             TREATMENT DATE:   02/23/24: [name] went on vacation for 1 week - she did not bring alginate or CPAP and had severe coughing on the plane due to fumes on the plane and multiple extended coughing on her cruise.  She continues to report elimination of throat clearing. Phonation clear, volume remains low - high intensity speech exercises - glides with rare min A 10x, reading 8-9 word sentences to average 80dB with clear voice with rare min A. In structured speech task she required occasional min to maintain 74dB average and clear voice. No throat clearing today. No coughing episodes despite using high intensity voice. She demonstrates relaxed sniff-blow and swallows -- required verbal cues to continue to use these strategies to suppress cough when it is caused by scents, fumes, talking, laughing, change in temp. She sees Dr. Okey 8/21 and requests cancelling ST appointments until after her consult with laryngologist.    02/07/24: [name] reports reduced coughing and use of relaxed sniff-blow with success. Targeted SOVTE - results in s/s of ILO/VCD  -cough and stridor with cued relaxed sniff-blow - Targeted volume with high intensity voice exercises sustained ah, glide with 3 episodes of VCD/ILO again managed with relaxed sniff blow and swallow. Reading 8-9 word sentences with averaged of 74dB with usual min to mod A,  no  coughing or stridor with increased volume.  Frequent mod verbal cues and modeling to carryover WNL volume in conversation. She reports reduced coughing at night. I have asked her to get a referral to a laryngologist to evaluate possible Vocal cord dysfunction/inspiratory laryngeal obstruction ( Dr. Okey).   7/21/25suppressed : [name] started alginate last week. She  has been on gabapentin  for 1 month due to bronchitis. She reports some reduction in coughing, however she still has frequent episodes of throat tightness and prolonged cough. SOVTE attempted -  5 trials of straw hum all resulted in stridor and cough - VCD sx managed with relaxed abdominal sniff-blow with intermittent dry swallows with frequent verbal cues and modeling to use nasal sniff as [name] often reverted to rapid oral panting increasing stridor. This session focused on managing VCD symptoms as talking with volume above 68dB and laughing all resulted in inspiratory stridor and coughing. She endorses laughing, change in temp, scents and talking all trigger cough and difficulty breathing. I requested her spouse attend next session and encouraged her to get a referral to  a laryngologist.   01/26/24: She demonstrates  SOVTE with verbal cues to hum she was just blowing without phonation. After instruction and demonstration, she completed 10 straw hums for 8-10 seconds each and strong volume. She did required some breaks with sniff-blows. She demonstrated 10 glides up and down, accents and anthem with rare min A after initial modeling, good volume. Sustained vowel in habitual pitch with average of 84dB resulted in inspiratory laryngeal stridor and cough 3/7 attempts. With verbal cues, modeling usually, [name] utilized relaxed sniff-blow, swallow and sip to stop ILO/VCD symptoms successfully each episode. Initially she continued to gasp through her mouth despite ongoing cues to sniff with her nose, however this improved to more consistent nasal  sniff with subsequent episodes. Volume remains quite low (64-65dB), difficult to hear her in quiet environment. Targeted increased vocal intensity reading short, conversation sentences with modeling and verbal cues to reach 68-70dB where [name] feels like she is shouting - 2/10 sentences did result in ILO episodes again quickly stopped with cueing nasal sniff and blow. HEP - continue SOVTE and add sentences with increased vocal intensity. Structured speech task generating 3 descriptions of animals using increased vocal intensity to achieve 68-70dB with occasional min A. She has worked on avoiding throat clears and today improved to 3 throat clears.  01/20/24: Evaluation completed - Initiated training in SOVTE to reduce laryngeal tension and as vocal hygiene/warm up for singing in choir. She completed SOVTE with occasional min A, difficulty sustaining hum for greater than 5-6 seconds. Glide with occasional min A, sirens and anthem with occasional min A. Pt reports fatigue with SOVTE and sustained vowel. Cough and stridor triggered by laughing and during sustained vowel, guided pt through relaxed sniff- audible blow (sh) to stop cough and stridor with frequent mod verbal cues and modeling. This was effective at stopping cough/stridor. Pt instructed in throat clear alternatives and how throat clearing behavior affects voice and vocal folds. She demonstrated use of swallow 2x to avoid throat clearing with verbal cues.     PATIENT EDUCATION: Education details: HEP for voice, vocal hygiene, throat clear alternatives, compensations for VCD, reflux precautions Person educated: Patient Education method: Explanation, Demonstration, Verbal cues, and Handouts Education comprehension: returned demonstration, verbal cues required, and needs further education  HOME EXERCISE PROGRAM: SOVTE, high intensity vocal exercises, resonant voice, vocal function exercises  GOALS: Goals reviewed with patient? Yes  SHORT TERM  GOALS: Target date: 02/24/24  Pt will complete HEP for voice with occasional min A Baseline: Goal status: ONGOING  2.  Pt will sustain vowel for 10 seconds Baseline: 5.75 over 5 trials Goal status: ONGOING  3.  Pt will average 68dB  18/20 sentences Baseline:  Goal status: ONGOING  4.  Pt will use throat  clear alternatives to clear her throat 2 or less times a session Baseline:  Goal status: ONGOING  5.  Pt will use relaxed sniff-blow to manage VCD cough/stridor 2/4 opportunities with occasional min A Baseline:  Goal status: ONGOING  6.  Pt will maintain clear phonation and 68dB over 5 minute conversation with occasional min A Baseline:  Goal status: ONGOING  LONG TERM GOALS: Target date: 03/30/24  Pt will complete HEP for voice with rare min A Baseline:  Goal status: INITIAL  2.  Pt will maintain average of 68dB over 12 minute conversation with rare min A Baseline:  Goal status: ONGOING  3.  Pt will have no more than 1 throat clear a session Baseline:  Goal status: ONGOING  4.  Pt will maintain clear phonation over 12 minute conversation Baseline:  Goal status: ONGOING  5.  Pt will improve score on VRQOL Baseline: 22 Goal status: ONGOING    ASSESSMENT:  CLINICAL IMPRESSION: Patient is a 68 y.o. female who was seen today for mild to moderate dysphonia. Her voice affects her ability to sing in the church choir. [name] reports that talking is tiresome and that she looses her voice at times. She reports a tight sensation in her throat. Volume is quit low, affecting intelligibility in this quiet office room. She endorses other tell her she mumbles and talks low. Voice hoarse and strained. Reading passes with intermittent aphonia, hoarse voice, consistent vocal fry and episodes of stridor and cough by the end of the [name] passage. She reports talking is harder than singing. [employer] revealed clear, robust phonation, excellent tune and 75dB-80dB  volume, inconsistent with speaking voice. She reports VCD has resolved, however VCD sx present several times during evaluation which responded well to relaxed sniff-blow. Likely a functional component to voice disorder due to inconsistency of speaking vs singing voice. I recommend skilled ST to maximize intelligibility for QOL, to reduce caregiver burden and success in choir. .   OBJECTIVE IMPAIRMENTS: include voice disorder and dysphagia. These impairments are limiting patient from effectively communicating at home and in community and safety when swallowing. Factors affecting potential to achieve goals and functional outcome are n/a.SABRA Patient will benefit from skilled SLP services to address above impairments and improve overall function.  REHAB POTENTIAL: Good  PLAN:  SLP FREQUENCY: 1-2x/week  SLP DURATION: 10 weeks  PLANNED INTERVENTIONS: Aspiration precaution training, Pharyngeal strengthening exercises, Diet toleration management , Environmental controls, Trials of upgraded texture/liquids, Cueing hierachy, Internal/external aids, Functional tasks, Multimodal communication approach, SLP instruction and feedback, Compensatory strategies, Patient/family education, and [id number] Treatment of speech (30 or 45 min)     Eliezer Khawaja, Leita Caldron, CCC-SLP 02/23/2024, 10:54 AM

## 2024-02-25 ENCOUNTER — Ambulatory Visit: Admitting: Speech Pathology

## 2024-02-29 ENCOUNTER — Encounter: Admitting: Speech Pathology

## 2024-03-02 ENCOUNTER — Ambulatory Visit (INDEPENDENT_AMBULATORY_CARE_PROVIDER_SITE_OTHER): Admitting: Otolaryngology

## 2024-03-02 VITALS — BP 121/71 | HR 74 | Wt 188.0 lb

## 2024-03-02 DIAGNOSIS — R0600 Dyspnea, unspecified: Secondary | ICD-10-CM

## 2024-03-02 DIAGNOSIS — J383 Other diseases of vocal cords: Secondary | ICD-10-CM | POA: Diagnosis not present

## 2024-03-02 DIAGNOSIS — R490 Dysphonia: Secondary | ICD-10-CM | POA: Diagnosis not present

## 2024-03-02 DIAGNOSIS — D2222 Melanocytic nevi of left ear and external auricular canal: Secondary | ICD-10-CM

## 2024-03-02 NOTE — Progress Notes (Signed)
 ENT CONSULT:  Reason for Consult: dysphonia and throat tightness    HPI: Discussed the use of AI scribe software for clinical note transcription with the patient, who gave verbal consent to proceed.  History of Present Illness [name] is a 68 year old female with hx of interstitial lung disease and vocal cord dysfunction who presents with episodes of voice loss and throat tightness. She was referred by her primary care doctor to a speech therapist for vocal cord dysfunction. She has been working with speech.   She experiences episodes of voice changes, described as feeling like she might be having tightness and trouble passing air, particularly in certain environments such as during time changes or when outdoors. These episodes result in a sudden loss of voice, often occurring while she is speaking.  She has been working with a speech therapist to manage these symptoms and is currently practicing rescue breathing techniques.  She has a history of asthma and was previously thought to have COPD. She mentions undergoing pulmonary function tests and seeing Dr. Tanda Kingdom. A CT chest was performed in 2023 due to a history of interstitial lung disease and showed bronchiectasis.   No heartburn, reflux, or trouble with swallowing.   Records Reviewed:  SLP visit with Laura Lovvorn 02/07/24 [name] is a pleasant 68 y.o. female who presents as a new patient for throat concerns. She has a history of vocal cord dysfunction and chronic bronchitis. She is followed by Pulmonary. She enjoys singing and states that often times the mucous in her throat interferes with vocal quality with occasional soreness in the throat. She takes once daily Protonix , 40mg  in the am. Has been evaluated by Speech Pathology in the past but its been some years.     Past Medical History:  Diagnosis Date   Allergy     seasonal   Anxiety    Arthritis    both shoulders   Asthma    Blood transfusion without  reported diagnosis    Cataract    beginning   Chronic bronchitis (HCC)    Cold extremity without peripheral vascular disease    bilaterally; from my knees down into my feet; they get ice cold   COPD (chronic obstructive pulmonary disease) (HCC)    Environmental allergies    year round   Exertional dyspnea    Gastroparesis    GERD (gastroesophageal reflux disease)    Headache(784.0)    Heart murmur    Hiatal hernia    High cholesterol    HTN (hypertension)    Obesity    Rotator cuff tear    Sciatic nerve pain    tailbone down into my legs when it flares up   Sciatica    Sleep apnea    CPAP, sleep study on [facility]   Type II diabetes mellitus (HCC)    Vocal cord dysfunction    I've had it for many years    Past Surgical History:  Procedure Laterality Date   ABDOMINAL ADHESION SURGERY  ~ 1978   APPENDECTOMY  07/13/1974   CHOLECYSTECTOMY     COLONOSCOPY     NASAL SINUS SURGERY  07/13/1990   OOPHORECTOMY  07/13/1974   w/ovarian cyst excision and appy   SHOULDER ARTHROSCOPY W/ ROTATOR CUFF REPAIR Bilateral 01/28/2012   left   TOE SURGERY     for ingrown [id number] ?left   TOTAL ABDOMINAL HYSTERECTOMY  07/14/1983    Family History  Problem Relation Age of Onset   Heart disease  Mother    Asthma Mother    Heart failure Maternal Grandmother    Asthma Daughter    Lupus Daughter    Asthma Son    Diabetes Other        everybody on both sides   Colon cancer Neg Hx    Throat cancer Neg Hx    Stomach cancer Neg Hx    Esophageal cancer Neg Hx    Rectal cancer Neg Hx    Colon polyps Neg Hx    Crohn's disease Neg Hx    Ulcerative colitis Neg Hx     Social History:  reports that she quit smoking about 27 years ago. Her smoking use included cigarettes. She started smoking about 52 years ago. She has a 25 pack-year smoking history. She has been exposed to tobacco smoke. She has never used smokeless tobacco. She reports that she does not drink alcohol and does not  use drugs.  Allergies:  Allergies  Allergen Reactions   Lisinopril Cough    Medications: I have reviewed the patient's current medications.  The PMH, PSH, Medications, Allergies, and SH were reviewed and updated.  ROS: Constitutional: Negative for fever, weight loss and weight gain. Cardiovascular: Negative for chest pain and dyspnea on exertion. Respiratory: Is not experiencing shortness of breath at rest. Gastrointestinal: Negative for nausea and vomiting. Neurological: Negative for headaches. Psychiatric: The patient is not nervous/anxious  Blood pressure 121/71, pulse 74, weight 188 lb (85.3 kg), SpO2 96%. Body mass index is 29.44 kg/m.  PHYSICAL EXAM:  Exam: General: Well-developed, well-nourished Respiratory Respiratory effort: Equal inspiration and expiration without stridor Cardiovascular Peripheral Vascular: Warm extremities with equal color/perfusion Eyes: No nystagmus with equal extraocular motion bilaterally Neuro/Psych/Balance: Patient oriented to person, place, and time; Appropriate mood and affect; Gait is intact with no imbalance; Cranial nerves I-XII are intact Head and Face Inspection: Normocephalic and atraumatic without mass or lesion Palpation: Facial skeleton intact without bony stepoffs Salivary Glands: No mass or tenderness Facial Strength: Facial motility symmetric and full bilaterally ENT Pinna: External ear intact and fully developed External canal: Canal is patent with intact skin Tympanic Membrane: Clear and mobile External Nose: No scar or anatomic deformity Internal Nose: Septum is straight. No polyp, or purulence. Mucosal edema and erythema present.  Bilateral inferior turbinate hypertrophy.  Lips, Teeth, and gums: Mucosa and teeth intact and viable TMJ: No pain to palpation with full mobility Oral cavity/oropharynx: No erythema or exudate, no lesions present Nasopharynx: No mass or lesion with intact mucosa Hypopharynx: Intact mucosa  without pooling of secretions Larynx Glottic: Full true vocal cord mobility without lesion or mass Supraglottic: Normal appearing epiglottis and AE folds Interarytenoid Space: Moderate pachydermia&edema Subglottic Space: Patent without lesion or edema Neck Neck and Trachea: Midline trachea without mass or lesion Thyroid : No mass or nodularity Lymphatics: No lymphadenopathy  Procedure: Preoperative diagnosis: dysphonia, episodes of dyspnea  Postoperative diagnosis:   Same + vocal cord dysfunction  No evidence of scar or narrowing in subglottis. Adduction of VF with inspiration, improves with rescue breathing techniques   Procedure: Flexible fiberoptic laryngoscopy  Surgeon: Elena Larry, MD  Anesthesia: Topical lidocaine  and Afrin Complications: None Condition is stable throughout exam  Indications and consent:  The patient presents to the clinic with above symptoms. Indirect laryngoscopy view was incomplete. Thus it was recommended that they undergo a flexible fiberoptic laryngoscopy. All of the risks, benefits, and potential complications were reviewed with the patient preoperatively and verbal informed consent was obtained.  Procedure: The patient  was seated upright in the clinic. Topical lidocaine  and Afrin were applied to the nasal cavity. After adequate anesthesia had occurred, I then proceeded to pass the flexible telescope into the nasal cavity. The nasal cavity was patent without rhinorrhea or polyp. The nasopharynx was also patent without mass or lesion. The base of tongue was visualized and was normal. There were no signs of pooling of secretions in the piriform sinuses. The true vocal folds were mobile bilaterally. There were no signs of glottic or supraglottic mucosal lesion or mass. There was moderate interarytenoid pachydermia and post cricoid edema. The telescope was then slowly withdrawn and the patient tolerated the procedure throughout.   Studies Reviewed: CT chest  2023 done 2/2 hx of ILD 1. No evidence of fibrotic interstitial lung disease. 2. Unchanged mild central cylindrical bronchiectasis. 3. 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 4. Peribronchovascular ground-glass nodularity is again seen in the bilateral lower lobes and not significantly changed compared with prior exam, likely sequela of prior infection. 5. Coronary artery calcifications and aortic Atherosclerosis (ICD10-I70.0).  Assessment/Plan: Encounter Diagnosis  Name Primary?   Vocal cord dysfunction Yes    Assessment and Plan Assessment & Plan Vocal cord dysfunction She had an episode of adduction with inspiration during the exam today, which improved with rescue breathing technique. No other sx, no trouble with swallowing. She does have voice changes during episodes, but her VF were mobile and had no lesions on scope exam today.  We discussed rescue breathing techniques and importance of working with speech therapist.  - Continue voice therapy. - Practice rescue breathing techniques regularly.   Left auricular mole, appears irregular - needs referral to dermatologist for mole evaluation. She will consult with PCP  Bronchiectasis  - proceed with repeat CT chest and see Pulm as scheduled  Thank you for allowing me to participate in the care of this patient. Please do not hesitate to contact me with any questions or concerns.   Elena Larry, MD Otolaryngology [facility] Phone: [phone] Fax: [phone]    03/02/2024, 11:32 AM

## 2024-03-07 ENCOUNTER — Ambulatory Visit: Admitting: Speech Pathology

## 2024-03-07 ENCOUNTER — Encounter: Payer: Self-pay | Admitting: Speech Pathology

## 2024-03-07 DIAGNOSIS — R498 Other voice and resonance disorders: Secondary | ICD-10-CM | POA: Diagnosis not present

## 2024-03-07 NOTE — Therapy (Signed)
 OUTPATIENT SPEECH LANGUAGE PATHOLOGY VOICE TREATMENT & DISCHARGE SUMMARY   Patient Name: [name] MRN: [id number] DOB:1956/06/11, 68 y.o., female Today's Date: 03/07/2024  PCP: Ransom Other, MD REFERRING PROVIDER: Benay Kay, PA-C  END OF SESSION:  End of Session - 03/07/24 1101     Visit Number 6    Number of Visits 10    Date for SLP Re-Evaluation 03/30/24    SLP Start Time 1100    SLP Stop Time  1130    SLP Time Calculation (min) 30 min    Activity Tolerance Patient tolerated treatment well          Past Medical History:  Diagnosis Date   Allergy     seasonal   Anxiety    Arthritis    both shoulders   Asthma    Blood transfusion without reported diagnosis    Cataract    beginning   Chronic bronchitis (HCC)    Cold extremity without peripheral vascular disease    bilaterally; from my knees down into my feet; they get ice cold   COPD (chronic obstructive pulmonary disease) (HCC)    Environmental allergies    year round   Exertional dyspnea    Gastroparesis    GERD (gastroesophageal reflux disease)    Headache(784.0)    Heart murmur    Hiatal hernia    High cholesterol    HTN (hypertension)    Obesity    Rotator cuff tear    Sciatic nerve pain    tailbone down into my legs when it flares up   Sciatica    Sleep apnea    CPAP, sleep study on [facility]   Type II diabetes mellitus (HCC)    Vocal cord dysfunction    I've had it for many years   Past Surgical History:  Procedure Laterality Date   ABDOMINAL ADHESION SURGERY  ~ 1978   APPENDECTOMY  07/13/1974   CHOLECYSTECTOMY     COLONOSCOPY     NASAL SINUS SURGERY  07/13/1990   OOPHORECTOMY  07/13/1974   w/ovarian cyst excision and appy   SHOULDER ARTHROSCOPY W/ ROTATOR CUFF REPAIR Bilateral 01/28/2012   left   TOE SURGERY     for ingrown [id number] ?left   TOTAL ABDOMINAL HYSTERECTOMY  07/14/1983   Patient Active Problem List   Diagnosis Date Noted   Tracheobronchomalacia  03/09/2022   H/O tooth extraction 11/22/2017   Sinusitis 11/21/2017   Irritable larynx syndrome 09/18/2015   ILD (interstitial lung disease) (HCC) 10/22/2014   Loss of weight 05/08/2014   Rotator cuff tear 01/29/2012   Chest pain 08/26/2011   Allergic rhinitis 08/08/2011   Obstructive sleep apnea 08/08/2011   Chronic cough 02/18/2011   Dyspnea 02/18/2011   Diabetes mellitus type 2, insulin  dependent (HCC) 05/26/2007   ANXIETY, SITUATIONAL 05/26/2007   Essential hypertension 05/26/2007   Asthma, chronic 05/26/2007   GERD 05/26/2007   LIPID DISORDER, HX OF 05/26/2007    Onset date: 12/09/2023 (referral date)  REFERRING DIAG: R49.0 (ICD-10-CM) - Dysphonia  THERAPY DIAG:  Other voice and resonance disorders  Rationale for Evaluation and Treatment: Rehabilitation  SUBJECTIVE:   SUBJECTIVE STATEMENT: I am still coughing mainly at night Pt accompanied by: self  PERTINENT HISTORY: [name] is a pleasant 68 y.o. female who presents as a new patient for throat concerns. She has a history of vocal cord dysfunction and chronic bronchitis. She is followed by Pulmonary. She enjoys singing and states that often times the mucous in her throat interferes  with vocal quality with occasional soreness in the throat. She takes once daily Protonix , 40mg  in the am. Has been evaluated by Speech Pathology in the past but its been some years.   PAIN:  Are you having pain? No  PATIENT GOALS: To sing without hurting, sing correctly, stop mumbling    DIAGNOSTIC FINDINGS:  Normal base of tongue and supraglottis. Normal vocal cord mobility without vocal cord nodule, mass, polyp or tumor. No muscle tension patterns noted. Hypopharynx normal without mass, pooling of secretions or aspiration. Moderate post-glottic edema. Hoarseness - Primary  Dysphonia   Laryngopharyngeal reflux (LPR)  ENT referred to Dr. Soldatova, however she was unable to scope pt as pt did not tolerate scope (requested ped  scope)    SOCIAL HISTORY: Occupation: Part Hydrologist, sings in choir Water intake: suboptimal Caffeine/alcohol intake: minimal Daily voice use: minimal and sings twice a week  PERCEPTUAL VOICE ASSESSMENT: Voice quality: hoarse and low vocal intensity Vocal abuse: excessive voice use and sings without vocal warm ups, throat clears - 15+ this eval Resonance: normal Respiratory function: thoracic breathing Inspiratory laryngeal obstruction/VCD 4 episodes this eval                                                                                                                             TREATMENT DATE:   03/07/24: [name]'s saw laryngologist  - ILO evident on her scope. -  referred her to continue ST. [name] has slacked off on taking alginate. She continues to cough at night. She independently noted perfume scent in lobby as possible trigger, although she did not have ILO attack. [name] demonstrates rescue sniff-blows and swallow to stop of prevent VCD symptoms. One episode of inspiratory stridor - [name] independently used rescue breathing strategies to prevent cough and stop stridor. She has eliminated throat clearing behavior. 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 Encouraged [name] to continue her allergy  meds and resume alginate. Goals met, d/c ST  02/23/24: [name] went on vacation for 1 week - she did not bring alginate or CPAP and had severe coughing on the plane due to fumes on the plane and multiple extended coughing on her cruise.  She continues to report elimination of throat clearing. Phonation clear, volume remains low - high intensity speech exercises - glides with rare min A 10x, reading 8-9 word sentences to average 80dB with clear voice with rare min A. In structured speech task she required occasional min to maintain 74dB average and clear voice. No throat clearing today. No coughing episodes despite using  high intensity voice. She demonstrates relaxed sniff-blow and swallows -- required verbal cues to continue to use these strategies to suppress cough when it is caused by scents, fumes, talking, laughing, change in temp. She sees Dr. Okey 8/21 and requests cancelling ST appointments until after her consult with laryngologist.    02/07/24: [name] reports reduced coughing and  use of relaxed sniff-blow with success. Targeted SOVTE - results in s/s of ILO/VCD  -cough and stridor with cued relaxed sniff-blow - Targeted volume with high intensity voice exercises sustained ah, glide with 3 episodes of VCD/ILO again managed with relaxed sniff blow and swallow. Reading 8-9 word sentences with averaged of 74dB with usual min to mod A,  no coughing or stridor with increased volume.  Frequent mod verbal cues and modeling to carryover WNL volume in conversation. She reports reduced coughing at night. I have asked her to get a referral to a laryngologist to evaluate possible Vocal cord dysfunction/inspiratory laryngeal obstruction ( Dr. Okey).   7/21/25suppressed : Ishika started alginate last week. She has been on gabapentin  for 1 month due to bronchitis. She reports some reduction in coughing, however she still has frequent episodes of throat tightness and prolonged cough. SOVTE attempted -  5 trials of straw hum all resulted in stridor and cough - VCD sx managed with relaxed abdominal sniff-blow with intermittent dry swallows with frequent verbal cues and modeling to use nasal sniff as Beadie often reverted to rapid oral panting increasing stridor. This session focused on managing VCD symptoms as talking with volume above 68dB and laughing all resulted in inspiratory stridor and coughing. She endorses laughing, change in temp, scents and talking all trigger cough and difficulty breathing. I requested her spouse attend next session and encouraged her to get a referral to  a laryngologist.   01/26/24: She  demonstrates  SOVTE with verbal cues to hum she was just blowing without phonation. After instruction and demonstration, she completed 10 straw hums for 8-10 seconds each and strong volume. She did required some breaks with sniff-blows. She demonstrated 10 glides up and down, accents and anthem with rare min A after initial modeling, good volume. Sustained vowel in habitual pitch with average of 84dB resulted in inspiratory laryngeal stridor and cough 3/7 attempts. With verbal cues, modeling usually, Jaszmine utilized relaxed sniff-blow, swallow and sip to stop ILO/VCD symptoms successfully each episode. Initially she continued to gasp through her mouth despite ongoing cues to sniff with her nose, however this improved to more consistent nasal sniff with subsequent episodes. Volume remains quite low (64-65dB), difficult to hear her in quiet environment. Targeted increased vocal intensity reading short, conversation sentences with modeling and verbal cues to reach 68-70dB where Chandrika feels like she is shouting - 2/10 sentences did result in ILO episodes again quickly stopped with cueing nasal sniff and blow. HEP - continue SOVTE and add sentences with increased vocal intensity. Structured speech task generating 3 descriptions of animals using increased vocal intensity to achieve 68-70dB with occasional min A. She has worked on avoiding throat clears and today improved to 3 throat clears.  01/20/24: Evaluation completed - Initiated training in SOVTE to reduce laryngeal tension and as vocal hygiene/warm up for singing in choir. She completed SOVTE with occasional min A, difficulty sustaining hum for greater than 5-6 seconds. Glide with occasional min A, sirens and anthem with occasional min A. Pt reports fatigue with SOVTE and sustained vowel. Cough and stridor triggered by laughing and during sustained vowel, guided pt through relaxed sniff- audible blow (sh) to stop cough and stridor with frequent mod verbal cues and  modeling. This was effective at stopping cough/stridor. Pt instructed in throat clear alternatives and how throat clearing behavior affects voice and vocal folds. She demonstrated use of swallow 2x to avoid throat clearing with verbal cues.     PATIENT EDUCATION: Education  details: HEP for voice, vocal hygiene, throat clear alternatives, compensations for VCD, reflux precautions Person educated: Patient Education method: Explanation, Demonstration, Verbal cues, and Handouts Education comprehension: returned demonstration, verbal cues required, and needs further education  HOME EXERCISE PROGRAM: SOVTE, high intensity vocal exercises, resonant voice, vocal function exercises  GOALS: Goals reviewed with patient? Yes  SHORT TERM GOALS: Target date: 02/24/24  Pt will complete HEP for voice with occasional min A Baseline: Goal status: MET  2.  Pt will sustain vowel for 10 seconds Baseline: 5.75 over 5 trials Goal status: MET  3.  Pt will average 68dB  18/20 sentences Baseline:  Goal status: MET  4.  Pt will use throat clear alternatives to clear her throat 2 or less times a session Baseline:  Goal status: MET  5.  Pt will use relaxed sniff-blow to manage VCD cough/stridor 2/4 opportunities with occasional min A Baseline:  Goal status: ONGOING  6.  Pt will maintain clear phonation and 68dB over 5 minute conversation with occasional min A Baseline:  Goal status: MET  LONG TERM GOALS: Target date: 03/30/24  Pt will complete HEP for voice with rare min A Baseline:  Goal status: MET  2.  Pt will maintain average of 68dB over 12 minute conversation with rare min A Baseline:  Goal status: MET  3.  Pt will have no more than 1 throat clear a session Baseline:  Goal status: MET  4.  Pt will maintain clear phonation over 12 minute conversation Baseline:  Goal status: MET  5.  Pt will improve score on VRQOL Baseline: 22 Goal status: DEFERRED    ASSESSMENT:  CLINICAL  IMPRESSION: Patient is a 68 y.o. female who was seen today for mild to moderate dysphonia. Her voice affects her ability to sing in the church choir. Initially, ILO/ VCD sx affected her ability to complete vocal exercises. Carryover of rescue breathing of relaxed sniff-blow with intermittent swallows to stop or prevent ILO attact completed with mod I. She is now able to talk at [facility] and complete HEP for voice without stridor or cough. Laughing continues to induce stridor/ILO - Leahann independently uses rescue breathing to prevent further stridor or cough. She has carried over clear voice and WNL volume outside of ST teaching Bible study. . Goals met, d/c ST - she is in agreement  OBJECTIVE IMPAIRMENTS: include voice disorder and dysphagia. These impairments are limiting patient from effectively communicating at home and in community and safety when swallowing. Factors affecting potential to achieve goals and functional outcome are n/a.SABRA Patient will benefit from skilled SLP services to address above impairments and improve overall function.  REHAB POTENTIAL: Good  PLAN:  SLP FREQUENCY: 1-2x/week  SLP DURATION: 10 weeks  PLANNED INTERVENTIONS: Aspiration precaution training, Pharyngeal strengthening exercises, Diet toleration management , Environmental controls, Trials of upgraded texture/liquids, Cueing hierachy, Internal/external aids, Functional tasks, Multimodal communication approach, SLP instruction and feedback, Compensatory strategies, Patient/family education, and [id number] Treatment of speech (30 or 45 min)   SPEECH THERAPY DISCHARGE SUMMARY  Visits from Start of Care: 6  Current functional level related to goals / functional outcomes: See goals above   Remaining deficits: ILO/VCD, allergies   Education / Equipment: Rescue breathing to stop or prevent ILO/VCDx, HEP for voice, compensatory strategies to improve vocal quality   Patient agrees to discharge. Patient goals were met.  Patient is being discharged due to meeting the stated rehab goals..     Alexza Norbeck Ann, CCC-SLP 03/07/2024, 11:36 AM

## 2024-03-07 NOTE — Patient Instructions (Addendum)
     Reflux [employer] after each meal and especially before bed  Vocal cord dysfunction/Inspiratory laryngeal obstruction  Keep practicing your relaxed sniff-blow with some swallows to manage and prevent cough from VCD

## 2024-03-10 ENCOUNTER — Other Ambulatory Visit: Payer: Self-pay | Admitting: Internal Medicine

## 2024-03-10 DIAGNOSIS — Z1231 Encounter for screening mammogram for malignant neoplasm of breast: Secondary | ICD-10-CM

## 2024-04-03 ENCOUNTER — Ambulatory Visit
Admission: RE | Admit: 2024-04-03 | Discharge: 2024-04-03 | Disposition: A | Discharge status: Home / Self Care | Source: Ambulatory Visit | Attending: Internal Medicine | Admitting: Internal Medicine

## 2024-04-03 ENCOUNTER — Other Ambulatory Visit: Payer: Self-pay | Admitting: Internal Medicine

## 2024-04-03 DIAGNOSIS — I10 Essential (primary) hypertension: Secondary | ICD-10-CM | POA: Diagnosis not present

## 2024-04-03 DIAGNOSIS — R0602 Shortness of breath: Secondary | ICD-10-CM | POA: Diagnosis not present

## 2024-04-03 DIAGNOSIS — Z8611 Personal history of tuberculosis: Secondary | ICD-10-CM

## 2024-04-11 DIAGNOSIS — J45909 Unspecified asthma, uncomplicated: Secondary | ICD-10-CM | POA: Diagnosis not present

## 2024-04-14 ENCOUNTER — Ambulatory Visit

## 2024-04-17 ENCOUNTER — Ambulatory Visit
Admission: RE | Admit: 2024-04-17 | Discharge: 2024-04-17 | Disposition: A | Discharge status: Home / Self Care | Source: Ambulatory Visit | Attending: Internal Medicine | Admitting: Internal Medicine

## 2024-04-17 DIAGNOSIS — Z1231 Encounter for screening mammogram for malignant neoplasm of breast: Secondary | ICD-10-CM | POA: Diagnosis not present

## 2024-04-22 ENCOUNTER — Ambulatory Visit (HOSPITAL_BASED_OUTPATIENT_CLINIC_OR_DEPARTMENT_OTHER)
Admission: RE | Admit: 2024-04-22 | Discharge: 2024-04-22 | Disposition: A | Source: Ambulatory Visit | Attending: Internal Medicine | Admitting: Internal Medicine

## 2024-04-22 DIAGNOSIS — R918 Other nonspecific abnormal finding of lung field: Secondary | ICD-10-CM | POA: Diagnosis not present

## 2024-04-22 DIAGNOSIS — J398 Other specified diseases of upper respiratory tract: Secondary | ICD-10-CM | POA: Insufficient documentation

## 2024-04-22 DIAGNOSIS — Z87891 Personal history of nicotine dependence: Secondary | ICD-10-CM | POA: Diagnosis not present

## 2024-04-22 DIAGNOSIS — J479 Bronchiectasis, uncomplicated: Secondary | ICD-10-CM | POA: Diagnosis not present

## 2024-04-22 DIAGNOSIS — J849 Interstitial pulmonary disease, unspecified: Secondary | ICD-10-CM | POA: Diagnosis not present

## 2024-04-23 ENCOUNTER — Ambulatory Visit: Payer: Self-pay | Admitting: Internal Medicine

## 2024-04-23 NOTE — Progress Notes (Signed)
 Patient is overdue for a follow-up. Please call and schedule an appointment to discuss results. GIVE APPT WITH APP

## 2024-04-23 NOTE — Progress Notes (Signed)
  IMPRESSION: 1. Redemonstration of mild central cylindrical bronchiectasis and subtle peribronchovascular ground-glass opacities in the bilateral posterior costophrenic angles, essentially similar to the prior study. No lung mass, consolidation, pleural effusion or pneumothorax. No suspicious lung nodule. 2. Multiple other nonacute observations, as described above.   Aortic Atherosclerosis (ICD10-I70.0).     Electronically Signed   By: Ree Molt M.D.   On: 04/22/2024 15:14

## 2024-04-24 ENCOUNTER — Encounter: Payer: Self-pay | Admitting: Internal Medicine

## 2024-04-24 NOTE — Progress Notes (Signed)
 ATC 1x left VM sent LTR

## 2024-05-15 ENCOUNTER — Ambulatory Visit: Admitting: Adult Health

## 2024-05-15 ENCOUNTER — Encounter: Payer: Self-pay | Admitting: Adult Health

## 2024-05-15 VITALS — BP 124/80 | HR 64 | Temp 97.9°F | Ht 66.0 in | Wt 199.4 lb

## 2024-05-15 DIAGNOSIS — J479 Bronchiectasis, uncomplicated: Secondary | ICD-10-CM

## 2024-05-15 DIAGNOSIS — G4733 Obstructive sleep apnea (adult) (pediatric): Secondary | ICD-10-CM | POA: Diagnosis not present

## 2024-05-15 DIAGNOSIS — Z87891 Personal history of nicotine dependence: Secondary | ICD-10-CM | POA: Diagnosis not present

## 2024-05-15 DIAGNOSIS — K219 Gastro-esophageal reflux disease without esophagitis: Secondary | ICD-10-CM

## 2024-05-15 DIAGNOSIS — R053 Chronic cough: Secondary | ICD-10-CM

## 2024-05-15 MED ORDER — PANTOPRAZOLE SODIUM 40 MG PO TBEC: 40.0000 mg | DELAYED_RELEASE_TABLET | Freq: Every day | ORAL | 5 refills | Status: AC

## 2024-05-15 NOTE — Progress Notes (Signed)
 @Patient  ID: [name], female    DOB: 11-15-1955, 68 y.o.   MRN: [id number]  Chief Complaint  Patient presents with   Follow-up    CT Scan F/U      Referring provider: Ransom Other, MD  HPI: 68 year old former smoker followed for Chronic cough and obstructive sleep apnea    TEST/EVENTS : Reviewed 05/15/2024  Discussed the use of AI scribe software for clinical note transcription with the patient, who gave verbal consent to proceed.  History of Present Illness [name] is a 68 year old female with obstructive sleep apnea who presents with persistent snoring despite CPAP use.  She experiences persistent snoring despite using her CPAP machine, with others noting the loudness of her snoring. She mentions difficulty with her chest when not using the CPAP, particularly when she dozes off at work. She has [place] spare CPAP at the airport.  Currently has a loaner from the DME.  Unable to get CPAP download today.  She experiences significant throat discomfort and dryness, which she attributes to her mouth being open while snoring. Her mouth and teeth are very dry, and her throat is sore to the point that it hurts to talk. She uses a full face mask with her CPAP.  No current issues with coughing. 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  She has been experiencing severe reflux, which worsened when there was a pharmacy error with her Protonix  prescription. She had to borrow medication due to the severity of her symptoms affecting her throat and chest. She takes Protonix  once a day.  Would like a refill sent to the pharmacy.   CT chest 11 April 22, 2024 showed stable mild bronchiectasis and faint ground glass opacities in the bilateral costophrenic angles.    Allergies  Allergen Reactions   Lisinopril Cough    Immunization History  Administered Date(s)  Administered   Influenza Split 03/31/2011, 05/19/2013, 03/13/2014, 05/16/2015, 04/26/2017   Influenza,inj,Quad PF,6+ Mos 03/13/2016, 03/29/2019   Moderna Covid-19 Fall Seasonal Vaccine 52yrs & older 07/01/2022   Moderna Sars-Covid-2 Vaccination 09/30/2019, 10/28/2019   Pfizer(Comirnaty)Fall Seasonal Vaccine 12 years and older 03/17/2023   Pneumococcal Conjugate PCV 7 10/11/2016   Pneumococcal Polysaccharide-23 07/14/2001   Td 07/15/1998   Tdap 10/11/2016   Zoster Recombinant(Shingrix) 05/03/2017   Zoster, Live 11/10/2016    Past Medical History:  Diagnosis Date   Allergy     seasonal   Anxiety    Arthritis    both shoulders   Asthma    Blood transfusion without reported diagnosis    Cataract    beginning   Chronic bronchitis (HCC)    Cold extremity without peripheral vascular disease    bilaterally; from my knees down into my feet; they get ice cold   COPD (chronic obstructive pulmonary disease) (HCC)    Environmental allergies    year round   Exertional dyspnea    Gastroparesis    GERD (gastroesophageal reflux disease)    Headache(784.0)    Heart murmur    Hiatal hernia    High cholesterol    HTN (hypertension)    Obesity    Rotator cuff tear    Sciatic nerve pain    tailbone down into my legs when it flares up   Sciatica    Sleep apnea    CPAP, sleep study on [facility]   Type II  diabetes mellitus (HCC)    Vocal cord dysfunction    I've had it for many years    Tobacco History: Social History   Tobacco Use  Smoking Status Former   Current packs/day: 0.00   Average packs/day: 1 pack/day for 25.0 years (25.0 ttl pk-yrs)   Types: Cigarettes   Start date: 07/14/1971   Quit date: 07/13/1996   Years since quitting: 27.8   Passive exposure: Past  Smokeless Tobacco Never   Counseling given: Not Answered   Outpatient Medications Prior to Visit  Medication Sig Dispense Refill   albuterol  (PROVENTIL  HFA;VENTOLIN  HFA) 108 (90 BASE) MCG/ACT inhaler Inhale 2  puffs into the lungs every 4 (four) hours as needed for wheezing or shortness of breath.     ALPRAZolam  (XANAX ) 0.5 MG tablet Take 0.5 mg by mouth 2 (two) times daily as needed for anxiety.      amLODipine (NORVASC) 5 MG tablet SMARTSIG:1 Tablet(s) By Mouth Every Evening     aspirin  81 MG tablet Take 81 mg by mouth 2 (two) times a week.     atorvastatin  (LIPITOR) 20 MG tablet Take 20 mg by mouth every evening.      Azelastine -Fluticasone  137-50 MCG/ACT SUSP Place 1 spray into both nostrils daily as needed (for congestion).     citalopram  (CELEXA ) 10 MG tablet Take 20 mg by mouth every evening.      fluticasone  (FLONASE ) 50 MCG/ACT nasal spray Place 2 sprays into both nostrils daily. 16 g 2   gabapentin  (NEURONTIN ) 300 MG capsule Take 300 mg by mouth 2 (two) times daily.     LANTUS SOLOSTAR 100 UNIT/ML Solostar Pen Inject into the skin.     linaclotide  (LINZESS ) 290 MCG CAPS capsule Take 1 capsule (290 mcg total) by mouth daily before breakfast. 90 capsule 3   loratadine  (CLARITIN ) 10 MG tablet Take 10 mg by mouth daily as needed for allergies.      losartan  (COZAAR ) 100 MG tablet Take 100 mg by mouth daily.      metoCLOPramide  (REGLAN ) 5 MG tablet TAKE 1 TABLET BY MOUTH BEFORE MEALS AND AT BEDTIME AS NEEDED 90 tablet 0   pantoprazole  (PROTONIX ) 40 MG tablet Take 1 tablet (40 mg total) by mouth 2 (two) times daily. 60 tablet 3   Semaglutide,0.25 or 0.5MG /DOS, (OZEMPIC, 0.25 OR 0.5 MG/DOSE,) 2 MG/3ML SOPN Inject 0.5mg  under the skin Subcutaneous once a week; Duration: 112 days     traMADol (ULTRAM) 50 MG tablet Take 50 mg by mouth every 6 (six) hours as needed for pain.      Continuous Blood Gluc Sensor (FREESTYLE LIBRE 14 DAY SENSOR) MISC Apply topically as directed. (Patient not taking: Reported on 05/15/2024)     metFORMIN (GLUCOPHAGE) 1000 MG tablet Take 1,000 mg by mouth 2 (two) times daily.  (Patient not taking: Reported on 03/02/2024)     zolpidem  (AMBIEN ) 5 MG tablet Take 5 mg by mouth at  bedtime.  3   No facility-administered medications prior to visit.     Review of Systems:   Constitutional:   No  weight loss, night sweats,  Fevers, chills, +fatigue, or  lassitude.  HEENT:   No headaches,  Difficulty swallowing,  Tooth/dental problems, or  Sore throat,                No sneezing, itching, ear ache, nasal congestion, post nasal drip,   CV:  No chest pain,  Orthopnea, PND, swelling in lower extremities, anasarca, dizziness, palpitations, syncope.  GI  No , abdominal pain, nausea, vomiting, diarrhea, change in bowel habits, loss of appetite, bloody stools.   Resp:   No chest wall deformity  Skin: no rash or lesions.  GU: no dysuria, change in color of urine, no urgency or frequency.  No flank pain, no hematuria   MS:  No joint pain or swelling.  No decreased range of motion.  No back pain.    Physical Exam  BP 124/80   Pulse 64   Temp 97.9 F (36.6 C) (Oral)   Ht 5' 6 (1.676 m) Comment: per patient  Wt 199 lb 6.4 oz (90.4 kg)   SpO2 97% Comment: on RA  BMI 32.18 kg/m   GEN: A/Ox3; pleasant , NAD, well nourished    HEENT:  [place]/AT,   NOSE-clear, THROAT-clear, no lesions, no postnasal drip or exudate noted.   NECK:  Supple w/ fair ROM; no JVD; normal carotid impulses w/o bruits; no thyromegaly or nodules palpated; no lymphadenopathy.    RESP  Clear  P & A; w/o, wheezes/ rales/ or rhonchi. no accessory muscle use, no dullness to percussion  CARD:  RRR, no m/r/g, no peripheral edema, pulses intact, no cyanosis or clubbing.  GI:   Soft & nt; nml bowel sounds; no organomegaly or masses detected.   Musco: Warm bil, no deformities or joint swelling noted.   Neuro: alert, no focal deficits noted.    Skin: Warm, no lesions or rashes    Lab Results:Reviewed 05/15/2024   CBC    BNP No results found for: BNP  ProBNP    Component Value Date/Time   PROBNP <30.0 09/27/2008 0007    Imaging: CT Chest Wo Contrast Result Date:  04/22/2024 CLINICAL DATA:  bronchiectasis, former ILD. EXAM: CT CHEST WITHOUT CONTRAST TECHNIQUE: Multidetector CT imaging of the chest was performed following the standard protocol without IV contrast. RADIATION DOSE REDUCTION: This exam was performed according to the departmental dose-optimization program which includes automated exposure control, adjustment of the mA and/or kV according to patient size and/or use of iterative reconstruction technique. COMPARISON:  CT scan chest from 02/12/2022. FINDINGS: Cardiovascular: Normal cardiac size. No pericardial effusion. No aortic aneurysm. There are coronary artery calcifications, in keeping with coronary artery disease. There are also mild peripheral atherosclerotic vascular calcifications of thoracic aorta and its major branches. Mediastinum/Nodes: Visualized thyroid  gland appears grossly unremarkable. No solid / cystic mediastinal masses. The esophagus is nondistended precluding optimal assessment. There is mild circumferential thickening of the lower thoracic esophagus with associated small sliding hiatal hernia. Findings are nonspecific but can be seen with esophagitis. Correlate clinically. No mediastinal or axillary lymphadenopathy by size criteria. Evaluation of bilateral hila is limited due to lack on intravenous contrast: however, no large hilar lymphadenopathy identified. Lungs/Pleura: The central tracheo-bronchial tree is patent. Redemonstration of mild central cylindrical bronchiectasis, essentially similar to the prior study. Redemonstration of subtle peribronchovascular ground-glass opacities in the bilateral posterior costophrenic angles, essentially similar to the prior study. No mass or consolidation. No pleural effusion or pneumothorax. No suspicious lung nodules. Upper Abdomen: Visualized upper abdominal viscera within normal limits. Musculoskeletal: The visualized soft tissues of the chest wall are grossly unremarkable. No suspicious osseous  lesions. There are mild multilevel degenerative changes in the visualized spine. IMPRESSION: 1. Redemonstration of mild central cylindrical bronchiectasis and subtle peribronchovascular ground-glass opacities in the bilateral posterior costophrenic angles, essentially similar to the prior study. No lung mass, consolidation, pleural effusion or pneumothorax. No suspicious lung nodule. 2. Multiple other nonacute observations,  as described above. Aortic Atherosclerosis (ICD10-I70.0). Electronically Signed   By: Ree Molt M.D.   On: 04/22/2024 15:14   MM 3D SCREENING MAMMOGRAM BILATERAL BREAST Result Date: 04/20/2024 CLINICAL DATA:  Screening. EXAM: DIGITAL SCREENING BILATERAL MAMMOGRAM WITH TOMOSYNTHESIS AND CAD TECHNIQUE: Bilateral screening digital craniocaudal and mediolateral oblique mammograms were obtained. Bilateral screening digital breast tomosynthesis was performed. The images were evaluated with computer-aided detection. COMPARISON:  Previous exam(s). ACR Breast Density Category b: There are scattered areas of fibroglandular density. FINDINGS: There are no findings suspicious for malignancy. IMPRESSION: No mammographic evidence of malignancy. A result letter of this screening mammogram will be mailed directly to the patient. RECOMMENDATION: Screening mammogram in one year. (Code:[id number]) BI-RADS CATEGORY  1: Negative. Electronically Signed   By: Debby Satterfield M.D.   On: 04/20/2024 09:07    Administration History     None          Latest Ref Rng & Units 11/08/2014    3:01 PM  PFT Results  FVC-Pre L 2.10   FVC-Predicted Pre % 69   FVC-Post L 2.06   FVC-Predicted Post % 67   Pre FEV1/FVC % % 86   Post FEV1/FCV % % 89   FEV1-Pre L 1.81   FEV1-Predicted Pre % 75   FEV1-Post L 1.84   DLCO uncorrected ml/min/mmHg 20.01   DLCO UNC% % 70   DLVA Predicted % 112   TLC L 3.51   TLC % Predicted % 63   RV % Predicted % 61     No results found for: NITRICOXIDE      No data to  display              Assessment & Plan:   Assessment and Plan Assessment & Plan Obstructive sleep apnea with persistent snoring on CPAP therapy    Potential issues include CPAP pressure settings or mask fit. She reports dry mouth and throat, possibly due to high CPAP pressures causing mouth opening. Bring the CPAP SD card for download and review of pressure settings. Coordinate with the nurse for CPAP data download. Consider mask fitting for potential alternative mask options.  Dry mouth and throat associated with CPAP use   Dry mouth and throat are likely due to CPAP use, possibly from high pressure settings causing mouth opening during sleep. Review CPAP pressure settings after SD card download. Consider alternative mask fitting to address these symptoms.  Mild bronchiectasis stable on recent CT chest.  No flare in symptoms.  Chronic cough appears to be improved.  No acute findings on CT chest.  Gastroesophageal reflux disease with recent exacerbation   Recent exacerbation of GERD symptoms occurred due to medication refill issues, causing severe throat and chest discomfort. Protonix  was not dispensed as expected, leading to symptom exacerbation. Refill the Protonix  prescription  Instruct her to obtain future refills through her primary care doctor.        Adeyemi Hamad, NP 05/15/2024  I spent   minutes dedicated to the care of this patient on the date of this encounter to include pre-visit review of records, face-to-face time with the patient discussing conditions above, post visit ordering of testing, clinical documentation with the electronic health record, making appropriate referrals as documented, and communicating necessary findings to members of the patients care team.

## 2024-05-15 NOTE — Patient Instructions (Addendum)
 Saline nasal spray Twice daily   Continue on Protonix  daily  Albuterol  inhaler As needed    Continue on CPAP At bedtime  and with naps.  Bring CPAP SD card for download.   Activity as tolerated  Work on healthy weight loss   Follow up in 1 year and As needed

## 2024-05-18 NOTE — Progress Notes (Unsigned)
 Chief Complaint: Primary GI MD: Dr. Albertus  HPI: 68 year old female past medical history significant for COPD, GERD, gastroparesis, chronic constipation, sleep apnea, hypertension, diabetes, appendectomy, hysterectomy  11/2019: Seen by Vina Dasen, NP.  Doing well on Reglan  2-3 times a day for generalized abdominal discomfort, nausea, increased gas.  Also on Amitiza 24 mg twice daily for constipation as Linzess  was cost prohibitive   Discussed the use of AI scribe software for clinical note transcription with the patient, who gave verbal consent to proceed.  History of Present Illness      PREVIOUS GI WORKUP   Colonoscopy for screening 05/01/2022 with Dr. Federico - The examined portion of the ileum was normal.  - Small lipoma in the transverse colon.  - Non- bleeding internal hemorrhoids.  - No specimens collected.  Past Medical History:  Diagnosis Date   Allergy     seasonal   Anxiety    Arthritis    both shoulders   Asthma    Blood transfusion without reported diagnosis    Cataract    beginning   Chronic bronchitis (HCC)    Cold extremity without peripheral vascular disease    bilaterally; from my knees down into my feet; they get ice cold   COPD (chronic obstructive pulmonary disease) (HCC)    Environmental allergies    year round   Exertional dyspnea    Gastroparesis    GERD (gastroesophageal reflux disease)    Headache(784.0)    Heart murmur    Hiatal hernia    High cholesterol    HTN (hypertension)    Obesity    Rotator cuff tear    Sciatic nerve pain    tailbone down into my legs when it flares up   Sciatica    Sleep apnea    CPAP, sleep study on [facility]   Type II diabetes mellitus (HCC)    Vocal cord dysfunction    I've had it for many years    Past Surgical History:  Procedure Laterality Date   ABDOMINAL ADHESION SURGERY  ~ 1978   APPENDECTOMY  07/13/1974   CHOLECYSTECTOMY     COLONOSCOPY     NASAL SINUS SURGERY  07/13/1990    OOPHORECTOMY  07/13/1974   w/ovarian cyst excision and appy   SHOULDER ARTHROSCOPY W/ ROTATOR CUFF REPAIR Bilateral 01/28/2012   left   TOE SURGERY     for ingrown [id number] ?left   TOTAL ABDOMINAL HYSTERECTOMY  07/14/1983    Current Outpatient Medications  Medication Sig Dispense Refill   albuterol  (PROVENTIL  HFA;VENTOLIN  HFA) 108 (90 BASE) MCG/ACT inhaler Inhale 2 puffs into the lungs every 4 (four) hours as needed for wheezing or shortness of breath.     ALPRAZolam  (XANAX ) 0.5 MG tablet Take 0.5 mg by mouth 2 (two) times daily as needed for anxiety.      amLODipine (NORVASC) 5 MG tablet SMARTSIG:1 Tablet(s) By Mouth Every Evening     aspirin  81 MG tablet Take 81 mg by mouth 2 (two) times a week.     atorvastatin  (LIPITOR) 20 MG tablet Take 20 mg by mouth every evening.      Azelastine -Fluticasone  137-50 MCG/ACT SUSP Place 1 spray into both nostrils daily as needed (for congestion).     citalopram  (CELEXA ) 10 MG tablet Take 20 mg by mouth every evening.      Continuous Blood Gluc Sensor (FREESTYLE LIBRE 14 DAY SENSOR) MISC Apply topically as directed. (Patient not taking: Reported on 05/15/2024)     fluticasone  (FLONASE ) 50  MCG/ACT nasal spray Place 2 sprays into both nostrils daily. 16 g 2   gabapentin  (NEURONTIN ) 300 MG capsule Take 300 mg by mouth 2 (two) times daily.     LANTUS SOLOSTAR 100 UNIT/ML Solostar Pen Inject into the skin.     linaclotide  (LINZESS ) 290 MCG CAPS capsule Take 1 capsule (290 mcg total) by mouth daily before breakfast. 90 capsule 3   loratadine  (CLARITIN ) 10 MG tablet Take 10 mg by mouth daily as needed for allergies.      losartan  (COZAAR ) 100 MG tablet Take 100 mg by mouth daily.      metFORMIN (GLUCOPHAGE) 1000 MG tablet Take 1,000 mg by mouth 2 (two) times daily.  (Patient not taking: Reported on 03/02/2024)     metoCLOPramide  (REGLAN ) 5 MG tablet TAKE 1 TABLET BY MOUTH BEFORE MEALS AND AT BEDTIME AS NEEDED 90 tablet 0   pantoprazole  (PROTONIX ) 40 MG  tablet Take 1 tablet (40 mg total) by mouth daily. 30 tablet 5   Semaglutide,0.25 or 0.5MG /DOS, (OZEMPIC, 0.25 OR 0.5 MG/DOSE,) 2 MG/3ML SOPN Inject 0.5mg  under the skin Subcutaneous once a week; Duration: 112 days     traMADol (ULTRAM) 50 MG tablet Take 50 mg by mouth every 6 (six) hours as needed for pain.      zolpidem  (AMBIEN ) 5 MG tablet Take 5 mg by mouth at bedtime.  3   No current facility-administered medications for this visit.    Allergies as of 05/19/2024 - Review Complete 05/15/2024  Allergen Reaction Noted   Lisinopril Cough 03/31/2011    Family History  Problem Relation Age of Onset   Heart disease Mother    Asthma Mother    Heart failure Maternal Grandmother    Asthma Daughter    Lupus Daughter    Asthma Son    Diabetes Other        everybody on both sides   Colon cancer Neg Hx    Throat cancer Neg Hx    Stomach cancer Neg Hx    Esophageal cancer Neg Hx    Rectal cancer Neg Hx    Colon polyps Neg Hx    Crohn's disease Neg Hx    Ulcerative colitis Neg Hx     Social History   Socioeconomic History   Marital status: Married    Spouse name: Not on file   Number of children: Not on file   Years of education: Not on file   Highest education level: Not on file  Occupational History   Occupation: unemployed  Tobacco Use   Smoking status: Former    Current packs/day: 0.00    Average packs/day: 1 pack/day for 25.0 years (25.0 ttl pk-yrs)    Types: Cigarettes    Start date: 07/14/1971    Quit date: 07/13/1996    Years since quitting: 27.8    Passive exposure: Past   Smokeless tobacco: Never  Vaping Use   Vaping status: Never Used  Substance and Sexual Activity   Alcohol use: No    Comment: 01/29/12 used to abuse alcohol; last drink was in the 1980's   Drug use: No   Sexual activity: Yes  Other Topics Concern   Not on file  Social History Narrative   Not on file   Social Drivers of Health   Financial Resource Strain: Not on file  Food Insecurity:  Low Risk  (12/08/2023)   Received from [facility]   Hunger Vital Sign    Within the past 12 months, you worried  that your food would run out before you got money to buy more: Never true    Within the past 12 months, the food you bought just didn't last and you didn't have money to get more. : Never true  Transportation Needs: No Transportation Needs (12/08/2023)   Received from [facility]    In the past 12 months, has lack of reliable transportation kept you from medical appointments, meetings, work or from getting things needed for daily living? : No  Physical Activity: Not on file  Stress: Not on file  Social Connections: Not on file  Intimate Partner Violence: Not on file    Review of Systems:    Constitutional: No weight loss, fever, chills, weakness or fatigue HEENT: Eyes: No change in vision               Ears, Nose, Throat:  No change in hearing or congestion Skin: No rash or itching Cardiovascular: No chest pain, chest pressure or palpitations   Respiratory: No SOB or cough Gastrointestinal: See HPI and otherwise negative Genitourinary: No dysuria or change in urinary frequency Neurological: No headache, dizziness or syncope Musculoskeletal: No new muscle or joint pain Hematologic: No bleeding or bruising Psychiatric: No history of depression or anxiety    Physical Exam:  Vital signs: There were no vitals taken for this visit.  Constitutional: NAD, alert and cooperative Head:  Normocephalic and atraumatic. Eyes:   PEERL, EOMI. No icterus. Conjunctiva pink. Respiratory: Respirations even and unlabored. Lungs clear to auscultation bilaterally.   No wheezes, crackles, or rhonchi.  Cardiovascular:  Regular rate and rhythm. No peripheral edema, cyanosis or pallor.  Gastrointestinal:  Soft, nondistended, nontender. No rebound or guarding. Normal bowel sounds. No appreciable masses or hepatomegaly. Rectal:  Declines Msk:  Symmetrical without gross  deformities. Without edema, no deformity or joint abnormality.  Neurologic:  Alert and  oriented x4;  grossly normal neurologically.  Skin:   Dry and intact without significant lesions or rashes. Psychiatric: Oriented to person, place and time. Demonstrates good judgement and reason without abnormal affect or behaviors.  Physical Exam    RELEVANT LABS AND IMAGING: CBC    Component Value Date/Time   WBC 6.5 10/29/2019 1539   RBC 4.50 10/29/2019 1539   HGB 12.0 10/29/2019 1539   HCT 38.9 10/29/2019 1539   PLT 224 10/29/2019 1539   MCV 86.4 10/29/2019 1539   MCH 26.7 10/29/2019 1539   MCHC 30.8 10/29/2019 1539   RDW 13.7 10/29/2019 1539   LYMPHSABS 1.3 03/10/2018 1344   MONOABS 0.5 03/10/2018 1344   EOSABS 0.2 03/10/2018 1344   BASOSABS 0.1 03/10/2018 1344    CMP     Component Value Date/Time   NA 138 10/29/2019 1539   K 4.0 10/29/2019 1539   CL 100 10/29/2019 1539   CO2 26 10/29/2019 1539   GLUCOSE 107 (H) 10/29/2019 1539   BUN 8 10/29/2019 1539   CREATININE 0.98 10/29/2019 1539   CALCIUM  9.4 10/29/2019 1539   PROT 7.4 03/10/2018 1344   ALBUMIN 3.9 03/10/2018 1344   AST 17 03/10/2018 1344   ALT 17 03/10/2018 1344   ALKPHOS 71 03/10/2018 1344   BILITOT 0.7 03/10/2018 1344   GFRNONAA >60 10/29/2019 1539   GFRAA >60 10/29/2019 1539     Assessment/Plan:   Assessment and Plan Assessment & Plan   GERD Recent exacerbation of GERD symptoms of GERD due to medication refill issues causing severe throat and chest discomfort.  EGD 2015,  small hiatal hernia, gastritis, otherwise unrevealing  Colon cancer screening Colonoscopy 04/2022 with hemorrhoids, small lipoma in transverse colon, otherwise normal to recall 10 years    Taleyah Hillman Mollie RIGGERS [facility] 05/18/2024, 11:31 AM  Cc: Husain, Karrar, MD

## 2024-05-19 ENCOUNTER — Encounter: Payer: Self-pay | Admitting: Gastroenterology

## 2024-05-19 ENCOUNTER — Ambulatory Visit: Admitting: Gastroenterology

## 2024-05-19 ENCOUNTER — Other Ambulatory Visit: Payer: Self-pay | Admitting: Gastroenterology

## 2024-05-19 ENCOUNTER — Telehealth: Payer: Self-pay | Admitting: Gastroenterology

## 2024-05-19 VITALS — BP 112/64 | HR 79 | Ht 66.0 in | Wt 196.0 lb

## 2024-05-19 DIAGNOSIS — Z1211 Encounter for screening for malignant neoplasm of colon: Secondary | ICD-10-CM | POA: Diagnosis not present

## 2024-05-19 DIAGNOSIS — K581 Irritable bowel syndrome with constipation: Secondary | ICD-10-CM | POA: Diagnosis not present

## 2024-05-19 DIAGNOSIS — K219 Gastro-esophageal reflux disease without esophagitis: Secondary | ICD-10-CM | POA: Diagnosis not present

## 2024-05-19 MED ORDER — TRULANCE 3 MG PO TABS: 3.0000 mg | ORAL_TABLET | Freq: Every day | ORAL | 3 refills | Status: DC

## 2024-05-19 MED ORDER — LUBIPROSTONE 24 MCG PO CAPS: 24.0000 ug | ORAL_CAPSULE | Freq: Two times a day (BID) | ORAL | 3 refills | Status: AC

## 2024-05-19 MED ORDER — LINACLOTIDE 290 MCG PO CAPS: 290.0000 ug | ORAL_CAPSULE | Freq: Every day | ORAL | 3 refills | Status: DC

## 2024-05-19 NOTE — Telephone Encounter (Signed)
 Patient requested trulance and I told her to call us  if there is any trouble. She said right now she needs something cheaper

## 2024-05-19 NOTE — Telephone Encounter (Signed)
 Pharmacy said that the linzess  and amitiza doesn't require a PA but the linzess  is really expensive and a 3 month supply for amitiza is 250 but he says you receives a discount with the 3 month. Please advise

## 2024-05-19 NOTE — Patient Instructions (Signed)
 _______________________________________________________  If your blood pressure at your visit was 140/90 or greater, please contact your primary care physician to follow up on this.  _______________________________________________________  If you are age 68 or older, your body mass index should be between 23-30. Your Body mass index is 31.64 kg/m. If this is out of the aforementioned range listed, please consider follow up with your Primary Care Provider.  If you are age 64 or younger, your body mass index should be between 19-25. Your Body mass index is 31.64 kg/m. If this is out of the aformentioned range listed, please consider follow up with your Primary Care Provider.   ________________________________________________________  The [facility] providers would like to encourage you to use MYCHART to communicate with providers for non-urgent requests or questions.  Due to long hold times on the telephone, sending your provider a message by [facility] may be a faster and more efficient way to get a response.  Please allow 48 business hours for a response.  Please remember that this is for non-urgent requests.  _______________________________________________________  [facility] is using a team-based approach to care.  Your team is made up of your doctor and two to three APPS. Our APPS (Nurse Practitioners and Physician Assistants) work with your physician to ensure care continuity for you. They are fully qualified to address your health concerns and develop a treatment plan. They communicate directly with your gastroenterologist to care for you. Seeing the Advanced Practice Practitioners on your physician's team can help you by facilitating care more promptly, often allowing for earlier appointments, access to diagnostic testing, procedures, and other specialty referrals.   You have been scheduled for an appointment with Nestor Blower PA-C on 07-03-24 at 820am. Please arrive 10  minutes early for your appointment.  We have given you samples of the following medication to take: Linzess   We have sent the following medications to your pharmacy for you to pick up at your convenience: Linzess   It was a pleasure to see you today!  Thank you for trusting me with your gastrointestinal care!

## 2024-05-19 NOTE — Telephone Encounter (Signed)
 Got a message from the pharmacy that trulance isnt covered. For the ibsrela 50mg  do you want me to do once or 2 times a day for her?

## 2024-05-19 NOTE — Telephone Encounter (Signed)
Done patient made aware.

## 2024-05-19 NOTE — Telephone Encounter (Signed)
**Note De-identified  [place] Obfuscation** Please advise 

## 2024-05-19 NOTE — Telephone Encounter (Signed)
 Inbound call from patient states she would like something else prescribed due to the cost of linzess . Please advise.

## 2024-05-19 NOTE — Telephone Encounter (Signed)
 Inbound call from patient stating that the second prescription that was called in was 250 dollars. Patient did not know what the name of the medication was. Please advise.

## 2024-05-22 MED ORDER — IBSRELA 50 MG PO TABS: 50.0000 mg | ORAL_TABLET | Freq: Two times a day (BID) | ORAL | 3 refills | Status: DC

## 2024-05-22 NOTE — Telephone Encounter (Signed)
 Patient made aware to call us  on Thursday if she hasn't heard anything. [place] has given to [facility] and I was told by pharmacy that they dont handle that and [facility] should be calling the patient in the next 2-3 days regarding this medication cost mailing etc

## 2024-05-22 NOTE — Telephone Encounter (Signed)
 Evidently Trulance is not covered by patient's insurance.  Please advise.

## 2024-05-22 NOTE — Telephone Encounter (Signed)
Do you want me to send in anything else?

## 2024-05-22 NOTE — Telephone Encounter (Signed)
 Sent it in

## 2024-05-23 ENCOUNTER — Telehealth: Payer: Self-pay

## 2024-05-23 NOTE — Telephone Encounter (Signed)
 Got a call from [facility] regarding a PA they started for Ibsrela. [name] said they key for covermymeds is [id number] for this patient medication PA. Please help. They said they will do a follow up call as well

## 2024-05-23 NOTE — Telephone Encounter (Signed)
 Pharmacy Patient Advocate Encounter   Received notification from Pt Calls Messages that prior authorization for Ibsrela 50MG  tablets is required/requested.   Insurance verification completed.   The patient is insured through [facility] ADVANTAGE/RX ADVANCE.   Per test claim: PA required; PA submitted to above mentioned insurance via Latent Key/confirmation #/EOC [id number] Status is pending

## 2024-05-24 ENCOUNTER — Other Ambulatory Visit (HOSPITAL_COMMUNITY): Payer: Self-pay

## 2024-05-24 MED ORDER — IBSRELA 50 MG PO TABS: 50.0000 mg | ORAL_TABLET | Freq: Two times a day (BID) | ORAL | 3 refills | Status: DC

## 2024-05-24 NOTE — Telephone Encounter (Signed)
 Patient stated that [name] is too costly for her.  She prefers to continue using Miralax and stool softeners as they are most cost effective.

## 2024-05-24 NOTE — Telephone Encounter (Signed)
 Ibsrela approved and resent to pharmacy

## 2024-05-24 NOTE — Telephone Encounter (Signed)
 Pharmacy Patient Advocate Encounter  Received notification from [facility] ADVANTAGE/RX ADVANCE that Prior Authorization for Ibsrela 50MG  tablets has been APPROVED from 05-23-2024 to 07-12-2025   PA #/Case ID/Reference #: [id number]

## 2024-05-24 NOTE — Telephone Encounter (Signed)
 Inbound call from [facility] stating that patient does not want to use Ibsrela for her treatment due to the cost of the medication.

## 2024-05-24 NOTE — Addendum Note (Signed)
 Addended by: BENJAMINE NAT DEL on: 05/24/2024 09:49 AM   Modules accepted: Orders

## 2024-05-25 MED ORDER — POLYETHYLENE GLYCOL 3350 17 G PO PACK: 17.0000 g | PACK | Freq: Every day | ORAL | Status: AC

## 2024-05-25 NOTE — Addendum Note (Signed)
 Addended by: BENJAMINE NAT DEL on: 05/25/2024 10:05 AM   Modules accepted: Orders

## 2024-05-25 NOTE — Addendum Note (Signed)
 Addended by: BENJAMINE NAT DEL on: 05/25/2024 10:04 AM   Modules accepted: Orders

## 2024-05-25 NOTE — Telephone Encounter (Signed)
 Patient advised of Bayley's recommendations and agreed to plan.

## 2024-05-25 NOTE — Telephone Encounter (Signed)
 Pharmacy Patient Advocate Encounter  Received notification from [facility] ADVANTAGE/RX ADVANCE that Prior Authorization for Ibsrela 50MG  tablets has been APPROVED from 05-23-2024 to 07-12-2025   PA #/Case ID/Reference #: [id number]

## 2024-06-19 DIAGNOSIS — J383 Other diseases of vocal cords: Secondary | ICD-10-CM | POA: Diagnosis not present

## 2024-06-19 DIAGNOSIS — J449 Chronic obstructive pulmonary disease, unspecified: Secondary | ICD-10-CM | POA: Diagnosis not present

## 2024-06-19 DIAGNOSIS — E782 Mixed hyperlipidemia: Secondary | ICD-10-CM | POA: Diagnosis not present

## 2024-06-19 DIAGNOSIS — J479 Bronchiectasis, uncomplicated: Secondary | ICD-10-CM | POA: Diagnosis not present

## 2024-06-19 DIAGNOSIS — R0781 Pleurodynia: Secondary | ICD-10-CM | POA: Diagnosis not present

## 2024-06-19 DIAGNOSIS — I700 Atherosclerosis of aorta: Secondary | ICD-10-CM | POA: Diagnosis not present

## 2024-06-19 DIAGNOSIS — E1140 Type 2 diabetes mellitus with diabetic neuropathy, unspecified: Secondary | ICD-10-CM | POA: Diagnosis not present

## 2024-06-19 DIAGNOSIS — R269 Unspecified abnormalities of gait and mobility: Secondary | ICD-10-CM | POA: Diagnosis not present

## 2024-06-19 DIAGNOSIS — I10 Essential (primary) hypertension: Secondary | ICD-10-CM | POA: Diagnosis not present

## 2024-06-20 ENCOUNTER — Other Ambulatory Visit: Payer: Self-pay

## 2024-06-20 ENCOUNTER — Emergency Department (HOSPITAL_COMMUNITY)
Admission: EM | Admit: 2024-06-20 | Discharge: 2024-06-20 | Disposition: A | Discharge status: Home / Self Care | Attending: Emergency Medicine | Admitting: Emergency Medicine

## 2024-06-20 DIAGNOSIS — S298XXA Other specified injuries of thorax, initial encounter: Secondary | ICD-10-CM

## 2024-06-20 DIAGNOSIS — Z7982 Long term (current) use of aspirin: Secondary | ICD-10-CM | POA: Diagnosis not present

## 2024-06-20 MED ORDER — ACETAMINOPHEN 325 MG PO TABS
650.0000 mg | ORAL_TABLET | Freq: Once | ORAL | Status: AC
  Administered 2024-06-20: 650 mg via ORAL
  Filled 2024-06-20: qty 2

## 2024-06-20 MED ORDER — OXYCODONE-ACETAMINOPHEN 5-325 MG PO TABS: 1.0000 | ORAL_TABLET | Freq: Three times a day (TID) | ORAL | 0 refills | Status: AC | PRN

## 2024-06-20 MED ORDER — LIDOCAINE 4 % EX PTCH: 1.0000 | MEDICATED_PATCH | Freq: Two times a day (BID) | CUTANEOUS | 0 refills | Status: AC

## 2024-06-20 MED ORDER — LIDOCAINE 5 % EX PTCH
1.0000 | MEDICATED_PATCH | CUTANEOUS | Status: DC
  Administered 2024-06-20: 1 via TRANSDERMAL
  Filled 2024-06-20: qty 1

## 2024-06-20 MED ORDER — NAPROXEN 500 MG PO TABS: 500.0000 mg | ORAL_TABLET | Freq: Two times a day (BID) | ORAL | 0 refills | Status: AC

## 2024-06-20 MED ORDER — NAPROXEN 500 MG PO TABS
500.0000 mg | ORAL_TABLET | Freq: Once | ORAL | Status: AC
  Administered 2024-06-20: 500 mg via ORAL
  Filled 2024-06-20: qty 1

## 2024-06-20 MED ORDER — ACETAMINOPHEN 500 MG PO TABS: 500.0000 mg | ORAL_TABLET | Freq: Four times a day (QID) | ORAL | 0 refills | Status: AC

## 2024-06-20 NOTE — ED Provider Notes (Signed)
 [facility] [facility] AT [facility] Provider Note   CSN: [id number] Arrival date & time: 06/20/24  1408     Patient presents with: [name] is a 68 y.o. female.   HPI     68 year old patient comes in with chief complaint of fall and resultant right-sided chest pain.  Patient states that [name] fell on Friday.  [name] fell in her house, and a hard surface.  Since then [name] has been having significant pain over the right scapular region, right posterior and lateral thoracic region and also has chest pain with deep inspiration.   Prior to Admission medications   Medication Sig Start Date End Date Taking? Authorizing Provider  albuterol  (PROVENTIL  HFA;VENTOLIN  HFA) 108 (90 BASE) MCG/ACT inhaler Inhale 2 puffs into the lungs every 4 (four) hours as needed for wheezing or shortness of breath.    [provider]  ALPRAZolam  (XANAX ) 0.5 MG tablet Take 0.5 mg by mouth 2 (two) times daily as needed for anxiety.     [provider]  amLODipine (NORVASC) 5 MG tablet SMARTSIG:1 Tablet(s) By Mouth Every Evening 01/30/22   [provider]  aspirin  81 MG tablet Take 81 mg by mouth 2 (two) times a week.    [provider]  atorvastatin  (LIPITOR) 20 MG tablet Take 20 mg by mouth every evening.     [provider]  Azelastine -Fluticasone  137-50 MCG/ACT SUSP Place 1 spray into both nostrils daily as needed (for congestion).    [provider]  citalopram  (CELEXA ) 10 MG tablet Take 20 mg by mouth every evening.  05/08/13   [provider]  Continuous Blood Gluc Sensor (FREESTYLE LIBRE 14 DAY SENSOR) MISC Apply topically as directed. 03/28/22   [provider]  fluticasone  (FLONASE ) 50 MCG/ACT nasal spray Place 2 sprays into both nostrils daily. 11/24/17   Sherrill Cable Latif, DO  gabapentin  (NEURONTIN ) 300 MG capsule Take 300 mg by mouth 2 (two) times daily.    [provider]  LANTUS SOLOSTAR 100  UNIT/ML Solostar Pen Inject into the skin. 03/10/21   [provider]  loratadine  (CLARITIN ) 10 MG tablet Take 10 mg by mouth daily as needed for allergies.     [provider]  losartan  (COZAAR ) 100 MG tablet Take 100 mg by mouth daily.     [provider]  lubiprostone  (AMITIZA ) 24 MCG capsule Take 1 capsule (24 mcg total) by mouth 2 (two) times daily with a meal. 05/19/24   McMichael, Bayley M, PA-C  metFORMIN (GLUCOPHAGE) 1000 MG tablet Take 1,000 mg by mouth 2 (two) times daily.  Patient not taking: Reported on 03/02/2024 04/15/13   [provider]  metoCLOPramide  (REGLAN ) 5 MG tablet TAKE 1 TABLET BY MOUTH BEFORE MEALS AND AT BEDTIME AS NEEDED 07/28/18   Pyrtle, Gordy HERO, MD  pantoprazole  (PROTONIX ) 40 MG tablet Take 1 tablet (40 mg total) by mouth daily. 05/15/24   Parrett, Madelin RAMAN, NP  polyethylene glycol (MIRALAX  / GLYCOLAX ) 17 g packet Take 17 g by mouth daily. Can use up to three times daily 05/25/24   Mollie Pfeiffer M, PA-C  Semaglutide,0.25 or 0.5MG /DOS, (OZEMPIC, 0.25 OR 0.5 MG/DOSE,) 2 MG/3ML SOPN Inject 0.5mg  under the skin Subcutaneous once a week; Duration: 112 days 08/31/23   [provider]  traMADol (ULTRAM) 50 MG tablet Take 50 mg by mouth every 6 (six) hours as needed for pain.     [provider]  zolpidem  (AMBIEN ) 5 MG  tablet Take 5 mg by mouth at bedtime. 11/09/17   [provider]    Allergies: Lisinopril    Review of Systems  All other systems reviewed and are negative.   Updated Vital Signs BP (!) 149/66 (BP Location: Left Arm)   Pulse 63   Temp 97.8 F (36.6 C) (Oral)   Resp 18   SpO2 100%   Physical Exam Vitals and nursing note reviewed.  Constitutional:      Appearance: [name] is well-developed.  HENT:     Head: Atraumatic.  Cardiovascular:     Rate and Rhythm: Normal rate.  Pulmonary:     Effort: Pulmonary effort is normal.  Musculoskeletal:     Cervical back: Normal range of motion and neck  supple.     Comments: Right sided posterior and lateral thoracic tenderness, bilateral breath sound  Skin:    General: Skin is warm and dry.  Neurological:     Mental Status: [name] is alert and oriented to person, place, and time.     (all labs ordered are listed, but only abnormal results are displayed) Labs Reviewed - No data to display  EKG: None  Radiology: No results found.   Procedures   Medications Ordered in the ED  naproxen  (NAPROSYN ) tablet 500 mg (has no administration in time range)  lidocaine  (LIDODERM ) 5 % 1 patch (has no administration in time range)  acetaminophen  (TYLENOL ) tablet 650 mg (has no administration in time range)                                    Medical Decision Making Risk OTC drugs. Prescription drug management.   68 year old patient comes in after sustaining what appears to be a mechanical fall. Pertinent past medical includes -diabetes, hypertension and interstitial lung disease/COPD. Collateral history provided by reviewing patient's outpatient records including PCP encounter yesterday and the x-ray that was done as an outpatient yesterday  Based on my history and exam, differential diagnosis includes: - Traumatic brain injury including rib fractures - Long bone fractures - Contusions - Soft tissue injury - Concussion  Patient had x-ray of the ribs done yesterday.,  There is no pneumothorax.  I did discuss the case with radiologist, and they indicated that x-ray is negative.  Patient is stable for discharge.  Will focus on pain management.  Final diagnoses:  Rib contusion, right, initial encounter    ED Discharge Orders     None          Charlyn Sora, MD 06/20/24 1658

## 2024-06-20 NOTE — Discharge Instructions (Addendum)
 Stop taking ibuprofen 800 mg, instead take Naprosyn  twice a day.  Take Tylenol  around-the-clock. Apply lidocaine  patch for further relief.  Make sure you are taking deep inspirations using the incentive spirometer to prevent any complications such as pneumonia.  Take stronger narcotic medicine that is prescribed only if the pain is excruciating.

## 2024-06-20 NOTE — ED Triage Notes (Signed)
 Pt ambulatory to triage reporting a fall on Friday. PT has complaints of pain in the RIGHT shoulder, RIGHT Ribs, RIGHT foot. Pt states that she has been taking ibuprofen and cyclobenzaprine with some relief, but not complete.   Pt was seen by PCP yesterday.

## 2024-07-03 ENCOUNTER — Ambulatory Visit: Admitting: Gastroenterology
